# Patient Record
Sex: Female | Born: 1960 | Race: White | Hispanic: No | Marital: Married | Smoking: Former smoker
Health system: Southern US, Community
[De-identification: ages and names within clinical notes are randomized; demographics above are authoritative.]

## PROBLEM LIST (undated history)

## (undated) DIAGNOSIS — N186 End stage renal disease: Secondary | ICD-10-CM

## (undated) DIAGNOSIS — Z720 Tobacco use: Secondary | ICD-10-CM

## (undated) DIAGNOSIS — F419 Anxiety disorder, unspecified: Secondary | ICD-10-CM

## (undated) DIAGNOSIS — Z951 Presence of aortocoronary bypass graft: Secondary | ICD-10-CM

## (undated) DIAGNOSIS — Z992 Dependence on renal dialysis: Secondary | ICD-10-CM

## (undated) DIAGNOSIS — G629 Polyneuropathy, unspecified: Secondary | ICD-10-CM

## (undated) DIAGNOSIS — E785 Hyperlipidemia, unspecified: Secondary | ICD-10-CM

## (undated) DIAGNOSIS — S99919A Unspecified injury of unspecified ankle, initial encounter: Secondary | ICD-10-CM

## (undated) DIAGNOSIS — I6529 Occlusion and stenosis of unspecified carotid artery: Secondary | ICD-10-CM

## (undated) DIAGNOSIS — R9430 Abnormal result of cardiovascular function study, unspecified: Secondary | ICD-10-CM

## (undated) DIAGNOSIS — R011 Cardiac murmur, unspecified: Secondary | ICD-10-CM

## (undated) DIAGNOSIS — IMO0002 Reserved for concepts with insufficient information to code with codable children: Secondary | ICD-10-CM

## (undated) DIAGNOSIS — Z87891 Personal history of nicotine dependence: Secondary | ICD-10-CM

## (undated) DIAGNOSIS — E663 Overweight: Secondary | ICD-10-CM

## (undated) DIAGNOSIS — I2510 Atherosclerotic heart disease of native coronary artery without angina pectoris: Secondary | ICD-10-CM

## (undated) DIAGNOSIS — I499 Cardiac arrhythmia, unspecified: Secondary | ICD-10-CM

## (undated) HISTORY — DX: End stage renal disease: Z99.2

## (undated) HISTORY — DX: Unspecified injury of unspecified ankle, initial encounter: S99.919A

## (undated) HISTORY — DX: Presence of aortocoronary bypass graft: Z95.1

## (undated) HISTORY — DX: Cardiac arrhythmia, unspecified: I49.9

## (undated) HISTORY — DX: Hemochromatosis, unspecified: E83.119

## (undated) HISTORY — DX: Anxiety disorder, unspecified: F41.9

## (undated) HISTORY — PX: INNER EAR SURGERY: SHX679

## (undated) HISTORY — DX: Cardiac murmur, unspecified: R01.1

## (undated) HISTORY — PX: OTHER SURGICAL HISTORY: SHX169

## (undated) HISTORY — DX: Polyneuropathy, unspecified: G62.9

## (undated) HISTORY — DX: Personal history of nicotine dependence: Z87.891

## (undated) HISTORY — PX: AV FISTULA PLACEMENT: SHX1204

## (undated) HISTORY — DX: End stage renal disease: N18.6

## (undated) HISTORY — DX: Abnormal result of cardiovascular function study, unspecified: R94.30

## (undated) HISTORY — DX: Reserved for concepts with insufficient information to code with codable children: IMO0002

## (undated) HISTORY — DX: Hyperlipidemia, unspecified: E78.5

## (undated) HISTORY — DX: Overweight: E66.3

## (undated) HISTORY — DX: Atherosclerotic heart disease of native coronary artery without angina pectoris: I25.10

## (undated) HISTORY — DX: Tobacco use: Z72.0

## (undated) HISTORY — DX: Occlusion and stenosis of unspecified carotid artery: I65.29

---

## 1993-07-09 HISTORY — PX: CHOLECYSTECTOMY: SHX55

## 1999-02-05 ENCOUNTER — Inpatient Hospital Stay (HOSPITAL_COMMUNITY): Admission: RE | Admit: 1999-02-05 | Discharge: 1999-02-10 | Payer: Self-pay | Admitting: *Deleted

## 2005-01-12 ENCOUNTER — Ambulatory Visit (HOSPITAL_COMMUNITY): Admission: RE | Admit: 2005-01-12 | Discharge: 2005-01-12 | Payer: Self-pay | Admitting: Vascular Surgery

## 2005-06-04 ENCOUNTER — Ambulatory Visit: Payer: Self-pay | Admitting: Psychiatry

## 2005-07-09 HISTORY — PX: CARDIAC VALVE SURGERY: SHX40

## 2005-08-08 ENCOUNTER — Ambulatory Visit: Payer: Self-pay | Admitting: Psychiatry

## 2005-08-23 ENCOUNTER — Ambulatory Visit: Payer: Self-pay | Admitting: Psychiatry

## 2005-09-12 ENCOUNTER — Ambulatory Visit (HOSPITAL_COMMUNITY): Payer: Self-pay | Admitting: Psychiatry

## 2005-10-02 ENCOUNTER — Ambulatory Visit (HOSPITAL_COMMUNITY): Payer: Self-pay | Admitting: Psychiatry

## 2005-10-03 ENCOUNTER — Ambulatory Visit (HOSPITAL_COMMUNITY): Payer: Self-pay | Admitting: Psychiatry

## 2005-10-24 ENCOUNTER — Ambulatory Visit (HOSPITAL_COMMUNITY): Payer: Self-pay | Admitting: Psychiatry

## 2005-10-30 ENCOUNTER — Ambulatory Visit (HOSPITAL_COMMUNITY): Payer: Self-pay | Admitting: Psychiatry

## 2005-11-14 ENCOUNTER — Ambulatory Visit (HOSPITAL_COMMUNITY): Payer: Self-pay | Admitting: Psychiatry

## 2005-11-29 ENCOUNTER — Ambulatory Visit (HOSPITAL_COMMUNITY): Payer: Self-pay | Admitting: Psychiatry

## 2005-12-14 ENCOUNTER — Ambulatory Visit (HOSPITAL_COMMUNITY): Payer: Self-pay | Admitting: Psychiatry

## 2006-01-04 ENCOUNTER — Ambulatory Visit (HOSPITAL_COMMUNITY): Payer: Self-pay | Admitting: Psychiatry

## 2006-01-24 ENCOUNTER — Ambulatory Visit (HOSPITAL_COMMUNITY): Payer: Self-pay | Admitting: Psychiatry

## 2006-01-30 ENCOUNTER — Ambulatory Visit (HOSPITAL_COMMUNITY): Payer: Self-pay | Admitting: Psychiatry

## 2006-02-27 ENCOUNTER — Ambulatory Visit (HOSPITAL_COMMUNITY): Payer: Self-pay | Admitting: Psychiatry

## 2006-03-20 ENCOUNTER — Ambulatory Visit (HOSPITAL_COMMUNITY): Payer: Self-pay | Admitting: Psychiatry

## 2006-03-26 ENCOUNTER — Ambulatory Visit (HOSPITAL_COMMUNITY): Payer: Self-pay | Admitting: Psychiatry

## 2006-04-22 ENCOUNTER — Ambulatory Visit (HOSPITAL_COMMUNITY): Payer: Self-pay | Admitting: Psychiatry

## 2006-05-06 ENCOUNTER — Encounter: Payer: Self-pay | Admitting: Cardiology

## 2006-05-08 ENCOUNTER — Ambulatory Visit: Payer: Self-pay | Admitting: Cardiology

## 2006-05-09 HISTORY — PX: CORONARY ARTERY BYPASS GRAFT: SHX141

## 2006-05-10 ENCOUNTER — Ambulatory Visit: Payer: Self-pay | Admitting: Cardiology

## 2006-05-13 ENCOUNTER — Ambulatory Visit (HOSPITAL_COMMUNITY): Payer: Self-pay | Admitting: Psychiatry

## 2006-05-17 ENCOUNTER — Ambulatory Visit: Payer: Self-pay | Admitting: Cardiology

## 2006-05-22 ENCOUNTER — Inpatient Hospital Stay (HOSPITAL_COMMUNITY): Admission: AD | Admit: 2006-05-22 | Discharge: 2006-06-03 | Payer: Self-pay | Admitting: Cardiovascular Disease

## 2006-05-22 ENCOUNTER — Inpatient Hospital Stay (HOSPITAL_BASED_OUTPATIENT_CLINIC_OR_DEPARTMENT_OTHER): Admission: RE | Admit: 2006-05-22 | Discharge: 2006-05-22 | Payer: Self-pay | Admitting: Cardiovascular Disease

## 2006-05-22 ENCOUNTER — Ambulatory Visit: Payer: Self-pay | Admitting: Cardiovascular Disease

## 2006-06-12 ENCOUNTER — Ambulatory Visit (HOSPITAL_COMMUNITY): Payer: Self-pay | Admitting: Psychiatry

## 2006-06-19 ENCOUNTER — Ambulatory Visit: Payer: Self-pay | Admitting: Cardiovascular Disease

## 2006-07-10 ENCOUNTER — Ambulatory Visit (HOSPITAL_COMMUNITY): Payer: Self-pay | Admitting: Psychiatry

## 2006-07-23 ENCOUNTER — Ambulatory Visit (HOSPITAL_COMMUNITY): Payer: Self-pay | Admitting: Psychiatry

## 2006-08-14 ENCOUNTER — Ambulatory Visit (HOSPITAL_COMMUNITY): Payer: Self-pay | Admitting: Psychiatry

## 2006-09-11 ENCOUNTER — Ambulatory Visit (HOSPITAL_COMMUNITY): Payer: Self-pay | Admitting: Psychiatry

## 2006-10-07 ENCOUNTER — Ambulatory Visit: Payer: Self-pay | Admitting: Cardiology

## 2006-10-09 ENCOUNTER — Ambulatory Visit (HOSPITAL_COMMUNITY): Payer: Self-pay | Admitting: Psychiatry

## 2006-11-20 ENCOUNTER — Encounter: Payer: Self-pay | Admitting: Cardiology

## 2006-11-21 ENCOUNTER — Ambulatory Visit (HOSPITAL_COMMUNITY): Payer: Self-pay | Admitting: Psychiatry

## 2006-12-08 ENCOUNTER — Encounter: Payer: Self-pay | Admitting: Cardiology

## 2006-12-25 ENCOUNTER — Ambulatory Visit (HOSPITAL_COMMUNITY): Payer: Self-pay | Admitting: Psychiatry

## 2007-01-08 ENCOUNTER — Ambulatory Visit (HOSPITAL_COMMUNITY): Payer: Self-pay | Admitting: Psychiatry

## 2007-01-24 ENCOUNTER — Ambulatory Visit (HOSPITAL_COMMUNITY): Payer: Self-pay | Admitting: Psychiatry

## 2007-02-19 ENCOUNTER — Ambulatory Visit (HOSPITAL_COMMUNITY): Payer: Self-pay | Admitting: Psychiatry

## 2007-02-24 ENCOUNTER — Ambulatory Visit: Payer: Self-pay | Admitting: Cardiology

## 2007-03-20 ENCOUNTER — Ambulatory Visit (HOSPITAL_COMMUNITY): Payer: Self-pay | Admitting: Psychiatry

## 2007-03-21 ENCOUNTER — Ambulatory Visit (HOSPITAL_COMMUNITY): Payer: Self-pay | Admitting: Psychiatry

## 2007-04-11 ENCOUNTER — Ambulatory Visit (HOSPITAL_COMMUNITY): Payer: Self-pay | Admitting: Psychiatry

## 2007-05-05 ENCOUNTER — Ambulatory Visit (HOSPITAL_COMMUNITY): Payer: Self-pay | Admitting: Psychiatry

## 2007-05-28 ENCOUNTER — Ambulatory Visit (HOSPITAL_COMMUNITY): Payer: Self-pay | Admitting: Psychiatry

## 2007-06-25 ENCOUNTER — Ambulatory Visit (HOSPITAL_COMMUNITY): Payer: Self-pay | Admitting: Psychiatry

## 2007-07-01 ENCOUNTER — Ambulatory Visit (HOSPITAL_COMMUNITY): Admission: RE | Admit: 2007-07-01 | Discharge: 2007-07-01 | Payer: Self-pay | Admitting: Nephrology

## 2007-07-15 ENCOUNTER — Ambulatory Visit (HOSPITAL_COMMUNITY): Payer: Self-pay | Admitting: Psychiatry

## 2007-07-23 ENCOUNTER — Ambulatory Visit (HOSPITAL_COMMUNITY): Payer: Self-pay | Admitting: Psychiatry

## 2007-08-01 ENCOUNTER — Ambulatory Visit: Payer: Self-pay | Admitting: Cardiology

## 2007-08-11 ENCOUNTER — Encounter: Payer: Self-pay | Admitting: Cardiology

## 2007-08-15 ENCOUNTER — Ambulatory Visit (HOSPITAL_COMMUNITY): Payer: Self-pay | Admitting: Psychiatry

## 2007-09-05 ENCOUNTER — Ambulatory Visit (HOSPITAL_COMMUNITY): Payer: Self-pay | Admitting: Psychiatry

## 2007-09-19 ENCOUNTER — Ambulatory Visit (HOSPITAL_COMMUNITY): Payer: Self-pay | Admitting: Psychiatry

## 2007-10-10 ENCOUNTER — Ambulatory Visit (HOSPITAL_COMMUNITY): Payer: Self-pay | Admitting: Psychiatry

## 2007-11-05 ENCOUNTER — Ambulatory Visit (HOSPITAL_COMMUNITY): Payer: Self-pay | Admitting: Psychiatry

## 2007-11-11 ENCOUNTER — Ambulatory Visit (HOSPITAL_COMMUNITY): Payer: Self-pay | Admitting: Psychiatry

## 2007-12-12 ENCOUNTER — Ambulatory Visit (HOSPITAL_COMMUNITY): Payer: Self-pay | Admitting: Psychiatry

## 2007-12-19 ENCOUNTER — Ambulatory Visit: Payer: Self-pay | Admitting: Cardiology

## 2007-12-31 ENCOUNTER — Encounter: Payer: Self-pay | Admitting: Cardiology

## 2007-12-31 ENCOUNTER — Ambulatory Visit: Payer: Self-pay | Admitting: Cardiology

## 2008-01-06 ENCOUNTER — Ambulatory Visit (HOSPITAL_COMMUNITY): Payer: Self-pay | Admitting: Psychiatry

## 2008-02-04 ENCOUNTER — Ambulatory Visit (HOSPITAL_COMMUNITY): Payer: Self-pay | Admitting: Psychiatry

## 2008-03-19 ENCOUNTER — Ambulatory Visit (HOSPITAL_COMMUNITY): Payer: Self-pay | Admitting: Psychiatry

## 2008-04-01 ENCOUNTER — Ambulatory Visit (HOSPITAL_COMMUNITY): Payer: Self-pay | Admitting: Psychiatry

## 2008-04-16 ENCOUNTER — Ambulatory Visit (HOSPITAL_COMMUNITY): Payer: Self-pay | Admitting: Psychiatry

## 2008-05-17 ENCOUNTER — Ambulatory Visit: Payer: Self-pay | Admitting: Cardiology

## 2008-06-18 ENCOUNTER — Ambulatory Visit (HOSPITAL_COMMUNITY): Payer: Self-pay | Admitting: Psychiatry

## 2008-07-22 ENCOUNTER — Ambulatory Visit (HOSPITAL_COMMUNITY): Payer: Self-pay | Admitting: Psychiatry

## 2008-07-23 ENCOUNTER — Ambulatory Visit (HOSPITAL_COMMUNITY): Payer: Self-pay | Admitting: Psychiatry

## 2008-08-27 ENCOUNTER — Ambulatory Visit (HOSPITAL_COMMUNITY): Payer: Self-pay | Admitting: Psychiatry

## 2008-09-16 ENCOUNTER — Ambulatory Visit (HOSPITAL_COMMUNITY): Payer: Self-pay | Admitting: Psychiatry

## 2008-09-22 ENCOUNTER — Encounter: Payer: Self-pay | Admitting: Cardiology

## 2008-09-24 ENCOUNTER — Ambulatory Visit (HOSPITAL_COMMUNITY): Payer: Self-pay | Admitting: Psychiatry

## 2008-10-20 ENCOUNTER — Encounter: Payer: Self-pay | Admitting: Cardiology

## 2008-10-21 ENCOUNTER — Encounter: Payer: Self-pay | Admitting: Cardiology

## 2008-10-29 ENCOUNTER — Ambulatory Visit (HOSPITAL_COMMUNITY): Payer: Self-pay | Admitting: Psychiatry

## 2008-11-08 ENCOUNTER — Encounter: Payer: Self-pay | Admitting: Cardiology

## 2008-11-12 ENCOUNTER — Ambulatory Visit (HOSPITAL_COMMUNITY): Payer: Self-pay | Admitting: Psychiatry

## 2008-12-01 ENCOUNTER — Ambulatory Visit: Payer: Self-pay | Admitting: Cardiology

## 2008-12-10 ENCOUNTER — Encounter: Payer: Self-pay | Admitting: Cardiology

## 2008-12-14 ENCOUNTER — Ambulatory Visit (HOSPITAL_COMMUNITY): Payer: Self-pay | Admitting: Psychiatry

## 2008-12-29 ENCOUNTER — Ambulatory Visit (HOSPITAL_COMMUNITY): Payer: Self-pay | Admitting: Psychiatry

## 2009-02-09 ENCOUNTER — Ambulatory Visit (HOSPITAL_COMMUNITY): Payer: Self-pay | Admitting: Psychiatry

## 2009-02-09 ENCOUNTER — Encounter: Payer: Self-pay | Admitting: Cardiology

## 2009-03-02 ENCOUNTER — Ambulatory Visit (HOSPITAL_COMMUNITY): Payer: Self-pay | Admitting: Psychiatry

## 2009-04-13 ENCOUNTER — Ambulatory Visit (HOSPITAL_COMMUNITY): Payer: Self-pay | Admitting: Psychiatry

## 2009-04-14 ENCOUNTER — Ambulatory Visit (HOSPITAL_COMMUNITY): Payer: Self-pay | Admitting: Psychiatry

## 2009-05-04 ENCOUNTER — Ambulatory Visit (HOSPITAL_COMMUNITY): Payer: Self-pay | Admitting: Psychiatry

## 2009-06-09 ENCOUNTER — Ambulatory Visit: Payer: Self-pay | Admitting: Cardiology

## 2009-06-09 DIAGNOSIS — Z6837 Body mass index (BMI) 37.0-37.9, adult: Secondary | ICD-10-CM | POA: Insufficient documentation

## 2009-06-09 DIAGNOSIS — F411 Generalized anxiety disorder: Secondary | ICD-10-CM | POA: Insufficient documentation

## 2009-07-12 ENCOUNTER — Ambulatory Visit (HOSPITAL_COMMUNITY): Payer: Self-pay | Admitting: Psychiatry

## 2009-07-24 ENCOUNTER — Ambulatory Visit: Payer: Self-pay | Admitting: Cardiology

## 2009-07-25 ENCOUNTER — Encounter: Payer: Self-pay | Admitting: Cardiology

## 2009-09-09 ENCOUNTER — Ambulatory Visit (HOSPITAL_COMMUNITY): Payer: Self-pay | Admitting: Psychiatry

## 2009-09-26 ENCOUNTER — Ambulatory Visit: Payer: Self-pay | Admitting: Cardiology

## 2009-09-26 DIAGNOSIS — E039 Hypothyroidism, unspecified: Secondary | ICD-10-CM | POA: Insufficient documentation

## 2009-10-06 ENCOUNTER — Ambulatory Visit (HOSPITAL_COMMUNITY): Payer: Self-pay | Admitting: Psychiatry

## 2009-10-10 ENCOUNTER — Telehealth (INDEPENDENT_AMBULATORY_CARE_PROVIDER_SITE_OTHER): Payer: Self-pay | Admitting: *Deleted

## 2009-10-17 ENCOUNTER — Ambulatory Visit (HOSPITAL_COMMUNITY): Payer: Self-pay | Admitting: Psychiatry

## 2009-11-07 ENCOUNTER — Ambulatory Visit (HOSPITAL_COMMUNITY): Payer: Self-pay | Admitting: Psychiatry

## 2009-11-22 ENCOUNTER — Telehealth (INDEPENDENT_AMBULATORY_CARE_PROVIDER_SITE_OTHER): Payer: Self-pay | Admitting: *Deleted

## 2009-11-29 ENCOUNTER — Encounter (INDEPENDENT_AMBULATORY_CARE_PROVIDER_SITE_OTHER): Payer: Self-pay | Admitting: *Deleted

## 2009-11-29 ENCOUNTER — Encounter: Payer: Self-pay | Admitting: Cardiology

## 2009-11-30 ENCOUNTER — Ambulatory Visit: Payer: Self-pay | Admitting: Cardiology

## 2009-11-30 ENCOUNTER — Ambulatory Visit (HOSPITAL_COMMUNITY): Payer: Self-pay | Admitting: Psychiatry

## 2009-12-09 ENCOUNTER — Encounter: Payer: Self-pay | Admitting: Cardiology

## 2009-12-10 ENCOUNTER — Encounter: Payer: Self-pay | Admitting: Cardiology

## 2009-12-13 ENCOUNTER — Telehealth: Payer: Self-pay | Admitting: Cardiology

## 2009-12-13 ENCOUNTER — Encounter: Payer: Self-pay | Admitting: Cardiology

## 2010-01-11 ENCOUNTER — Encounter: Payer: Self-pay | Admitting: Cardiology

## 2010-01-11 ENCOUNTER — Ambulatory Visit: Payer: Self-pay | Admitting: Vascular Surgery

## 2010-01-18 ENCOUNTER — Ambulatory Visit (HOSPITAL_COMMUNITY): Payer: Self-pay | Admitting: Psychiatry

## 2010-01-26 ENCOUNTER — Ambulatory Visit (HOSPITAL_COMMUNITY): Payer: Self-pay | Admitting: Psychiatry

## 2010-02-01 ENCOUNTER — Encounter: Payer: Self-pay | Admitting: Cardiology

## 2010-02-17 ENCOUNTER — Ambulatory Visit (HOSPITAL_COMMUNITY): Payer: Self-pay | Admitting: Psychiatry

## 2010-02-20 ENCOUNTER — Telehealth (INDEPENDENT_AMBULATORY_CARE_PROVIDER_SITE_OTHER): Payer: Self-pay | Admitting: *Deleted

## 2010-03-10 ENCOUNTER — Ambulatory Visit (HOSPITAL_COMMUNITY): Payer: Self-pay | Admitting: Psychiatry

## 2010-03-17 ENCOUNTER — Ambulatory Visit: Payer: Self-pay | Admitting: Cardiology

## 2010-03-31 ENCOUNTER — Ambulatory Visit (HOSPITAL_COMMUNITY): Payer: Self-pay | Admitting: Psychiatry

## 2010-04-06 ENCOUNTER — Telehealth (INDEPENDENT_AMBULATORY_CARE_PROVIDER_SITE_OTHER): Payer: Self-pay | Admitting: *Deleted

## 2010-04-12 ENCOUNTER — Ambulatory Visit (HOSPITAL_COMMUNITY): Payer: Self-pay | Admitting: Psychiatry

## 2010-04-25 ENCOUNTER — Ambulatory Visit (HOSPITAL_COMMUNITY): Payer: Self-pay | Admitting: Psychiatry

## 2010-05-03 ENCOUNTER — Ambulatory Visit (HOSPITAL_COMMUNITY): Payer: Self-pay | Admitting: Psychiatry

## 2010-05-24 ENCOUNTER — Ambulatory Visit (HOSPITAL_COMMUNITY): Payer: Self-pay | Admitting: Psychiatry

## 2010-06-14 ENCOUNTER — Ambulatory Visit (HOSPITAL_COMMUNITY): Payer: Self-pay | Admitting: Psychiatry

## 2010-07-07 ENCOUNTER — Ambulatory Visit (HOSPITAL_COMMUNITY): Payer: Self-pay | Admitting: Psychiatry

## 2010-07-20 ENCOUNTER — Ambulatory Visit (HOSPITAL_COMMUNITY): Admit: 2010-07-20 | Payer: Self-pay | Admitting: Psychiatry

## 2010-07-21 ENCOUNTER — Ambulatory Visit
Admission: RE | Admit: 2010-07-21 | Discharge: 2010-07-21 | Payer: Self-pay | Source: Home / Self Care | Attending: Vascular Surgery | Admitting: Vascular Surgery

## 2010-07-25 ENCOUNTER — Ambulatory Visit (HOSPITAL_COMMUNITY)
Admission: RE | Admit: 2010-07-25 | Discharge: 2010-07-25 | Payer: Self-pay | Source: Home / Self Care | Attending: Psychiatry | Admitting: Psychiatry

## 2010-08-02 ENCOUNTER — Ambulatory Visit (HOSPITAL_COMMUNITY)
Admission: RE | Admit: 2010-08-02 | Discharge: 2010-08-02 | Payer: Self-pay | Source: Home / Self Care | Attending: Psychiatry | Admitting: Psychiatry

## 2010-08-04 NOTE — Procedures (Unsigned)
CAROTID DUPLEX EXAM  INDICATION:  Carotid artery disease.  HISTORY: Diabetes:  No. Cardiac:  Open heart surgery. Hypertension:  Yes. Smoking:  Yes. Previous Surgery:  Left arm AV fistula. CV History:  Asymptomatic. Amaurosis Fugax No, Paresthesias No, Hemiparesis No                                      RIGHT             LEFT Brachial systolic pressure:         150               Not obtained Brachial Doppler waveforms:         Normal            Normal Vertebral direction of flow:        Antegrade Antegrade/resistive DUPLEX VELOCITIES (cm/sec) CCA peak systolic                   M=101, D=186      M=76, [id number] ECA peak systolic                   192               [id number] ICA peak systolic                   205               [id number] ICA end diastolic                   59                23 PLAQUE MORPHOLOGY:                  Mixed             Mixed PLAQUE AMOUNT:                      Moderate          Moderate/severe PLAQUE LOCATION:                    ICA, ECA, CCA     ICA, ECA, CCA  IMPRESSION: 1. Doppler velocity suggests 60% to 70% stenosis of the right proximal     internal carotid artery. 2. No hemodynamically significant stenosis of the left proximal     internal carotid artery noted, however, there is a significant     stenosis noted at the left distal common carotid artery near the     bifurcation region. 3. Left external carotid artery stenosis noted.  ___________________________________________ Jessy Oto Fields, MD  EM/MEDQ  D:  07/21/2010  T:  07/21/2010  Job:  [id number]

## 2010-08-08 NOTE — Progress Notes (Signed)
Summary: NEED MORE NITROSTAT  Phone Note Call from Patient Call back at Home Phone [phone]   Caller: Patient Call For: nurse Summary of Call: patient called and stated she needed refill on nitrostat since she dropped hers at church yesterday. patient informed nurse that she was using them more frequent now after nurse informed her that she just got refill on March 18th with 2 refills. Patient said she had already discussed this with MD to use them for her chest discomfort she was having as often as she needed to.  Initial call taken by: Georgina Peer,  October 10, 2009 2:00 PM    Prescriptions: NITROGLYCERIN 0.4 MG SUBL (NITROGLYCERIN) dissolve one tablet under tongue for severe chest pain as needed every 5 minutes, not to exceed 3 in 15 min time frame  #25 x 2   Entered by:   Georgina Peer   Authorized by:   Lorenza Evangelist, MD, [facility]   Signed by:   Georgina Peer on 10/10/2009   Method used:   Electronically to        [facility]* (retail)       [address]       [place], [place]  [address]       Ph: [phone] or [phone]       Fax: [phone]   RxID:   [id number]

## 2010-08-08 NOTE — Assessment & Plan Note (Signed)
Summary: PER DR. Kaziyah Parkison DISCUSS FREQUENCY OF NTG USAGE-JM   Visit Type:  Follow-up Primary Provider:  Stoney Bang  CC:  chest pain.  History of Present Illness: The patient has known coronary artery disease.  She also has several types of aches and pains.  In addition she becomes anxious when she has chest or back discomfort.  In this setting she uses nitroglycerin.  She feels that it helps.  I am not convinced that this represents ischemia.  I considered putting her on a longer acting nitrate which he has problems with hypotension on her dialysis days.  Preventive Screening-Counseling & Management  Alcohol-Tobacco     Smoking Status: current     Smoking Cessation Counseling: yes     Packs/Day: 1/2 PPD  Current Medications (verified): 1)  Nitroglycerin 0.4 Mg Subl (Nitroglycerin) .... Dissolve One Tablet Under Tongue For Severe Chest Pain As Needed Every 5 Minutes, Not To Exceed 3 in 15 Min Time Frame 2)  Metoprolol Tartrate 50 Mg Tabs (Metoprolol Tartrate) .... Take 1 Tablet By Mouth Twice A Day 3)  Simvastatin 40 Mg Tabs (Simvastatin) .... Take 1 Tablet By Mouth Every Night 4)  Sertraline Hcl 50 Mg Tabs (Sertraline Hcl) .... Take 1 Tablet By Mouth Every Morning 5)  Dialyvite  Tabs (B Complex-C-Folic Acid) .... 3,000mg  Tablet Daily 6)  Levothyroxine Sodium 200 Mcg Solr (Levothyroxine Sodium) .... Once Daily 7)  Zolpidem Tartrate 5 Mg Tabs (Zolpidem Tartrate) .... Take 1 Tab By Mouth At Bedtime 8)  Tramadol Hcl 50 Mg Tabs (Tramadol Hcl) .... Take 1 Tablet By Mouth Two Times A Day, May Take Three Times A Day As Needed 9)  Renvela 800 Mg Tabs (Sevelamer Carbonate) .... Take 5 Tablets With Meals, 2 Tabs With Snacks 10)  Aspirin 325 Mg Tabs (Aspirin) .... Take 1 Tablet By Mouth Once A Day 11)  Sodium Bicarbonate 650 Mg Tabs (Sodium Bicarbonate) .... Take 1 Tablet By Mouth Two Times A Day 12)  Clonazepam 0.5 Mg Tabs (Clonazepam) .Marland Kitchen.. 1-2 As Needed 13)  Buspirone Hcl 30 Mg Tabs (Buspirone  Hcl) .... Two Times A Day 14)  Proair Hfa 108 (90 Base) Mcg/act Aers (Albuterol Sulfate) .... As Needed  Allergies (verified): No Known Drug Allergies  Comments:  Nurse/Medical Assistant: The patient is currently on medications but does not know the name or dosage at this time. Instructed to contact our office with details. Will update medication list at that time.  Past History:  Past Medical History: Last updated: 06/09/2009 Overweight Anxiety disorder CAROTID ARTERY STENOSIS (ICD-433.10)...doppler 12/10/2008.Marland Kitchen0000000 RICA, 99991111 LICA, high resistence left vertebral suggests distal stenosis....MRI could be considered CAD, NATIVE VESSEL (ICD-414.01.   Total RCA.Marland Kitchen  /  .stress echo [facility] 11/2008...no ischemia...EF 55% CABG... Off pump LIMA to LAD... 05/2006 EF  55%  echo.Marland Kitchen.11/2008 Ankle brachial indices reduced in past OVERWEIGHT/OBESITY (ICD-278.02) HYPERLIPIDEMIA TYPE I / IV (ICD-272.3) ESRD....dialysis...transplant consideration ???  ASD  ???  no shunt by echo shunt study Tobacco abuse    Social History: Packs/Day:  1/2 PPD  Review of Systems       Patient denies fever, chills, headache, sweats, rash, change in vision, change in hearing, cough, nausea vomiting, urinary symptoms.  All other systems are reviewed and are negative other than the history of present illness.  Vital Signs:  Patient profile:   50 year old female Height:      58 inches Weight:      192 pounds Pulse rate:   86 / minute BP sitting:  111 / 63  (left arm) Cuff size:   large  Vitals Entered By: Georgina Peer (Nov 30, 2009 1:08 PM) CC: chest pain   Physical Exam  General:  patient is stable but overweight. Eyes:  no xanthelasma. Neck:  no jugular venous distention. Lungs:  lungs are clear.  Respiratory effort is nonlabored. Heart:  cardiac exam reveals S1-S2.  No clicks or significant murmurs. Abdomen:  abdomen is soft. Msk:  patient has a shunt in her left arm. Extremities:  no peripheral  edema. Psych:  patient is oriented to person time and place.  Affect is normal.   Impression & Recommendations:  Problem # 1:  TOBACCO ABUSE (ICD-305.1) I have counseled the patient and urged her to try to stop smoking.  Problem # 2:  ESRD (ICD-585.6) Patient is on dialysis to be continued.  Problem # 3:  CAROTID ARTERY STENOSIS (ICD-433.10)  Her updated medication list for this problem includes:    Aspirin 325 Mg Tabs (Aspirin) .Marland Kitchen... Take 1 tablet by mouth once a day The patient has carotid disease.  Her last Doppler was in June, 2010.  We will check to be sure that there is a one year followup plans.  Problem # 4:  CAD, NATIVE VESSEL (ICD-414.01) The patient has coronary disease.  She had a stress echo in May of 2010 showing no ischemia.  I do not think that her current chest pain he is ischemic in origin.  No further workup at this time.  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  I am not convinced it is cardiac in origin.  I considered adding a regular nitrate but I'm concerned about the possibility of hypotension at dialysis.  She knows to be careful with the nitroglycerin.  After very careful consideration I've chosen to allow her to take it on a regular basis as needed.  Her prescriptions will be filled on a regular basis.  Patient Instructions: 1)  Your physician wants you to follow-up in: 6 months. You will receive a reminder letter in the mail one-two months in advance. If you don't receive a letter, please call our office to schedule the follow-up appointment. 2)  You may have 30 nitroglycerin tablets per month per Dr. Ron Parker. Prescriptions: NITROSTAT 0.4 MG SUBL (NITROGLYCERIN) 1 tablet under tongue at onset of chest pain; you may repeat every 5 minutes for up to 3 doses.  #30 x 6   Entered by:   Gurney Maxin, RN, BSN   Authorized by:   Lorenza Evangelist, MD, [facility]   Signed by:   Gurney Maxin,  RN, BSN on 11/30/2009   Method used:   Electronically to        [facility]* (retail)       [address]       [place], [place]  [address]       Ph: [phone] or [phone]       Fax: [phone]   RxID:   [id number]

## 2010-08-08 NOTE — Progress Notes (Signed)
Summary: Needs ABD narrowing evaluated  Phone Note Call from Patient Call back at Home Phone [phone]   Summary of Call: Pt states she was called to [facility] for a kidney transplant this weekend. She states the doctor at [facility] transplant office told her that she has a valve in her "stomach" that was narrowing. He told pt it needs to be opened (balloon or stent) or she could potentially lose her legs if has kidney transplant. She states she had an ultrasound of her abd but it's been several years ago. Abd ultrasound from April 2009 scanned into EMR. Pt states she's not sure what the doctor was talking about but assumes we would be the one's to handle this.  Initial call taken by: Gurney Maxin, RN, BSN,  December 13, 2009 11:33 AM  Follow-up for Phone Call        Please find out who the doctor at [facility] is. We need written notes regarding their concerns. I have no idea what she needs to have done with her abdomen. Also, fax a copy of my notes from today along with a copy of the doppler to the [facility] doctor.      Appended Document: Needs ABD narrowing evaluated Left message to call back on machine regarding contact info for [facility]. Notified pt in message she can bring contact info to the office when she comes to sign release of information.  Appended Document: Needs ABD narrowing evaluated Left message with York Spaniel, RN, BSN at [facility] regarding information needed.  Appended Document: Needs ABD narrowing evaluated Records received from [facility] and routed to Dr. Ron Parker for review.  Appended Document: Needs ABD narrowing evaluated Please make appointment to see me. Also, can we try to get the office note from her recent traqnsplant evaluation at [facility]?   Appended Document: Needs ABD narrowing evaluated Pt scheduled to see Dr. Ron Parker on Friday, September 9th at 2pm. This is his first available appt.   Note from [facility] transplant team on 6/4 scanned into pts chart.

## 2010-08-08 NOTE — Miscellaneous (Signed)
Summary: Orders Update  Clinical Lists Changes  Orders: Added new Referral order of Carotid Duplex (Carotid Duplex) - Signed

## 2010-08-08 NOTE — Progress Notes (Signed)
Summary: NTG Refill: Pharmacy Question  Phone Note From Pharmacy   Caller: Kim at [facility] Summary of Call: Sherry Cox, nurse who also works at [employer], states pt has been getting frequent refills of NTG. She is concerned that pt is needing to fill NTG this frequently. She states pt was given 7 bottles of 25 tablets in April and has had NTG filled on 5/5 and 5/11 this month. She is now requesting another refill. Per Sherry Cox, pt told her today that she was told by Dr. Ron Parker to take NTG for anxiety. Discussed with Georgina Peer, LPN who states Dr. Ron Parker has told her in the past to refill this med as needed. Sherry Cox states if pt is going to continue using NTG at this frequency they need different directions for usage. Initial call taken by: Gurney Maxin, RN, BSN,  Nov 22, 2009 1:47 PM  Follow-up for Phone Call        Schedule patient to come in to see me in next few weeks.  Additional Follow-up for Phone Call Additional follow up Details #1::        Pt notified and scheduled for appt on Wednesday, May 25th with Dr. Ron Parker.   Kim at [facility]. Also notified Sherry Cox pt can have 1 refill on her NTG pending this appt.  Additional Follow-up by: Gurney Maxin, RN, BSN,  Nov 23, 2009 3:15 PM     Appended Document: NTG Refill: Pharmacy Question clarity in phone note,correction, patient informed Alma Friendly that she discussed with MD during her last visit about using the nitroglycerin and was informed by MD to use this as needed for her chest pain per previous phone note.

## 2010-08-08 NOTE — Progress Notes (Signed)
Summary: [facility]: Dr. Oneida Alar  [facility]: Dr. Oneida Alar   Imported By: Gurney Maxin, RN, BSN 02/03/2010 09:47:52  _____________________________________________________________________  External Attachment:    Type:   Image     Comment:   External Document

## 2010-08-08 NOTE — Letter (Signed)
Summary: External Correspondence/ FAXED [facility]  External Correspondence/ FAXED [facility]   Imported By: Bartholomew Boards 03/17/2010 14:46:29  _____________________________________________________________________  External Attachment:    Type:   Image     Comment:   External Document

## 2010-08-08 NOTE — Consult Note (Signed)
Summary: CARDIOOLOGY CONSULT/ [facility]/ [place]   Imported By: Delfino Lovett 09/26/2009 12:03:16  _____________________________________________________________________  External Attachment:    Type:   Image     Comment:   External Document

## 2010-08-08 NOTE — Letter (Signed)
Summary: [facility]/ D/C DR. HASANAJ  [facility]/ D/C DR. HASANAJ   Imported By: Delfino Lovett 09/26/2009 12:05:00  _____________________________________________________________________  External Attachment:    Type:   Image     Comment:   External Document

## 2010-08-08 NOTE — Assessment & Plan Note (Signed)
Summary: SCHEDULE APPT PER DR. KATZ-JM   Visit Type:  Follow-up Referring Provider:  Ruta Hinds, MD    Ilean China, RN, BSN  Pre-transplant Coordinator, Fax [phone] Primary Provider:  Javier Glazier   History of Present Illness: The patient is seen today for cardiology followup.  She is actually doing well.  She had some slight chest discomfort at times and becomes anxious.  She then takes one quarter of a nitroglycerin tablet.  We are not convinced that she is actually having ischemia.  The patient is going to have surgery to have her kidneys removed.  She has seen Dr. Ruta Hinds of [facility] in [place] for vascular evaluation of her carotids.  I will be faxing a copy of today's note and a copy of Dr. Nona Dell note to the patient's transplant coordinator.  The patient underwent CABG with an off pump LIMA to the LAD in November, 2007.  Echocardiography revealed an ejection fraction of 55% in May, 2010.  She actually had a stress echo at [facility] at that time with no ischemia.  She has significant carotid artery disease.  This has been assessed by Dr.Fields.  See his report.  There is question also of an abdominal bruit.  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   Preventive Screening-Counseling & Management  Alcohol-Tobacco     Smoking Status: current     Smoking Cessation Counseling: yes     Packs/Day: 1/2 PPD  Current Medications (verified): 1)  Nitrostat 0.4 Mg Subl (Nitroglycerin) .Marland Kitchen.. 1 Tablet Under Tongue At Onset of Chest Pain; You May Repeat Every 5 Minutes For Up To 3 Doses. 2)  Metoprolol Tartrate 50 Mg Tabs (Metoprolol Tartrate) .... Take 1 Tablet By Mouth Twice A Day 3)  Simvastatin 40 Mg Tabs (Simvastatin) .... Take 1 Tablet By Mouth Every Night 4)  Sertraline Hcl 50 Mg Tabs (Sertraline Hcl) .... Take 1 Tablet By Mouth Every Morning 5)  Dialyvite  Tabs (B Complex-C-Folic Acid) .... 3,000mg  Tablet Daily 6)   Levothyroxine Sodium 200 Mcg Solr (Levothyroxine Sodium) .... Once Daily 7)  Zolpidem Tartrate 5 Mg Tabs (Zolpidem Tartrate) .... Take 1 Tab By Mouth At Bedtime 8)  Tramadol Hcl 50 Mg Tabs (Tramadol Hcl) .... Take 1 Tablet By Mouth Two Times A Day, May Take Three Times A Day As Needed 9)  Renvela 800 Mg Tabs (Sevelamer Carbonate) .... Take 5 Tablets With Meals, 3 Tabs With Snacks 10)  Aspirin 325 Mg Tabs (Aspirin) .... Take 1 Tablet By Mouth Once A Day 11)  Sodium Bicarbonate 650 Mg Tabs (Sodium Bicarbonate) .... Take 1 Tablet By Mouth Two Times A Day 12)  Clonazepam 0.5 Mg Tabs (Clonazepam) .... Take 1 Tablet By Mouth Once A Day 13)  Proair Hfa 108 (90 Base) Mcg/act Aers (Albuterol Sulfate) .... As Needed  Allergies (verified): No Known Drug Allergies  Comments:  Nurse/Medical Assistant: The patient's medication list and allergies were reviewed with the patient and were updated in the Medication and Allergy Lists.  Past History:  Past Medical History: Overweight Anxiety disorder CAROTID ARTERY STENOSIS (ICD-433.10)...doppler 12/10/2008.Marland Kitchen0000000 RICA, [id number] LICA  /    see  evaluation by Dr. Oneida Alar  2011 CAD, NATIVE VESSEL (ICD-414.01.   Total RCA.Marland Kitchen  /  .stress echo [facility] 11/2008...no ischemia...EF 55% CABG... Off pump LIMA to LAD... 05/2006 EF  55%  echo.Marland Kitchen.11/2008 Ankle brachial indices reduced in past OVERWEIGHT/OBESITY (ICD-278.02) HYPERLIPIDEMIA TYPE I / IV (ICD-272.3) ESRD....dialysis...transplant consideration ???  ASD  ???  no shunt by echo shunt study Tobacco abuse    Review of Systems       Patient denies fever, chills, headache, sweats, rash, change in vision, change in hearing, chest pain, cough, nausea vomiting, urinary symptoms.  All other systems are reviewed and are negative.  Vital Signs:  Patient profile:   50 year old female Height:      58 inches Weight:      187 pounds Pulse rate:   72 / minute BP sitting:   122 / 70  (left arm) Cuff size:   large  Vitals  Entered By: Georgina Peer (March 17, 2010 1:48 PM)  Physical Exam  General:  The patient is overweight but stable. Head:  head is atraumatic. Eyes:  no xanthelasma. Neck:  no jugular venous distention. Chest Wall:  no chest wall tenderness. Lungs:  lungs are clear.  Respiratory effort is nonlabored. Heart:  cardiac exam reveals S1-S2.  No clicks or significant murmurs Abdomen:  abdomen is soft. Msk:  no musculoskeletal deformities. Extremities:  no peripheral edema  Skin:  no skin rashes. Psych:  patient is oriented to person time and place.  Affect is normal.   Impression & Recommendations:  Problem # 1:  * FREQUENT NITROGLYCERIN USE As I have mentioned in the history of present illness I believe she uses a small amount of nitroglycerin when she becomes anxious.  I do not believe she has significant ongoing ischemic symptoms.  Problem # 2:  TOBACCO ABUSE (ICD-305.1) Unfortunately the patient continues to smoke a small amount.  I have counseled her to stop.  Problem # 3:  CAROTID ARTERY STENOSIS (ICD-433.10)  Her updated medication list for this problem includes:    Aspirin 325 Mg Tabs (Aspirin) .Marland Kitchen... Take 1 tablet by mouth once a day Carotid artery stenosis is significant but stable.  Problem # 4:  CAD, NATIVE VESSEL (ICD-414.01)  Her updated medication list for this problem includes:    Nitrostat 0.4 Mg Subl (Nitroglycerin) .Marland Kitchen... 1 tablet under tongue at onset of chest pain; you may repeat every 5 minutes for up to 3 doses.    Metoprolol Tartrate 50 Mg Tabs (Metoprolol tartrate) .Marland Kitchen... Take 1 tablet by mouth twice a day    Aspirin 325 Mg Tabs (Aspirin) .Marland Kitchen... Take 1 tablet by mouth once a day Coronary disease is stable.  No further workup is needed.  Problem # 5:  ESRD (ICD-585.6) The patient is to have one of her kidneys removed.  A copy of this note will be sent to her pre-transplant coordinator along with a copy of Dr.Fields' note.  Patient Instructions: 1)  Your  physician wants you to follow-up in: 6 months. You will receive a reminder letter in the mail one-two months in advance. If you don't receive a letter, please call our office to schedule the follow-up appointment. 2)  Your physician recommends that you continue on your current medications as directed. Please refer to the Current Medication list given to you today.

## 2010-08-08 NOTE — Assessment & Plan Note (Signed)
Summary: [facility]   Visit Type:  Chart update Primary Provider:  Stoney Bang   History of Present Illness: PATIENT IS NOT SEEN IN OFFICE TODAY. I reviewed carotid doppler report. I am concerned about the findings. We will refer patient for early vascular surgery evaluation. They may want to repeat the doppler before deciding the next study needed.  See my office note of 11/30/2009 for complete history of patient. Send copy of this to vascular surgery.  Allergies: No Known Drug Allergies

## 2010-08-08 NOTE — Letter (Signed)
Summary: [facility]   [facility]   Imported By: Sallee Provencal 02/06/2010 16:13:42  _____________________________________________________________________  External Attachment:    Type:   Image     Comment:   External Document

## 2010-08-08 NOTE — Consult Note (Signed)
Summary: CONSULT / DR. Lowanda Foster  CONSULT / DR. Lowanda Foster   Imported By: Delfino Lovett 09/26/2009 12:05:39  _____________________________________________________________________  External Attachment:    Type:   Image     Comment:   External Document

## 2010-08-08 NOTE — Letter (Signed)
Summary: External Correspondence/ [facility]  External Correspondence/ [facility]   Imported By: Bartholomew Boards 12/19/2009 10:24:24  _____________________________________________________________________  External Attachment:    Type:   Image     Comment:   External Document

## 2010-08-08 NOTE — Letter (Signed)
Summary: Generic Doctor, general practice at [facility]. [address], [place] [address]   Phone: [phone]  Fax: [phone]    11/29/2009  Kellsey Whitcher [id number] [place], [place]  [address]  Dear Ms. Delong,   I have enclosed a copy of the order requested by Dr. Ron Parker for you cartoid dopplers. If this date and time are not suitable for you please call our office.         Sincerely,   Delfino Lovett Patient Care coordinator [phone] ext. [place]

## 2010-08-08 NOTE — Progress Notes (Signed)
Summary: REQUEST FOR ADDITIONAL NITROSTAT  Phone Note Call from Patient Call back at Home Phone [phone]   Caller: Patient Call For: doctor Summary of Call: patient called requesting additional refill of nitrostat since she is having some increased stressors from her little sister having a MI and she lost her daughter also. uses [facility]. Please advise. Initial call taken by: Georgina Peer,  February 20, 2010 4:25 PM  Follow-up for Phone Call        OK to fill Lorenza Evangelist, MD, [facility]  February 21, 2010 9:28 AM  Additional Follow-up for Phone Call Additional follow up Details #1::        Patient and pharmacy informed of the above.  Additional Follow-up by: Georgina Peer,  February 21, 2010 9:41 AM    Prescriptions: NITROSTAT 0.4 MG SUBL (NITROGLYCERIN) 1 tablet under tongue at onset of chest pain; you may repeat every 5 minutes for up to 3 doses.  #30 x 6   Entered by:   Georgina Peer   Authorized by:   Lorenza Evangelist, MD, [facility]   Signed by:   Georgina Peer on 02/21/2010   Method used:   Electronically to        [facility]* (retail)       [address]       [place], [place]  [address]       Ph: [phone] or [phone]       Fax: [phone]   RxID:   [id number]

## 2010-08-08 NOTE — Assessment & Plan Note (Signed)
Summary: EPH 2 MONTH FU PER DR.KATZ   Visit Type:  Follow-up Primary Provider:  Hasanaj  CC:  CAD.  History of Present Illness: The patient is seen for followup of coronary artery disease.  He has some vague chest heaviness at times but is not having any chest pain.  She had an off-pump LIMA to the LAD in November 2007.  Patient feels jittery today, but this may be related to the dosing of the inhaler.  Also her thyroid dose had been increased.  She will see Dr.Hasanaj in the next few days in talking to him about repeating her TSH.  Current Medications (verified): 1)  Nitroglycerin 0.4 Mg Subl (Nitroglycerin) .... Dissolve One Tablet Under Tongue For Severe Chest Pain As Needed Every 5 Minutes, Not To Exceed 3 in 15 Min Time Frame 2)  Metoprolol Tartrate 50 Mg Tabs (Metoprolol Tartrate) .... Take 1 Tablet By Mouth Twice A Day 3)  Simvastatin 40 Mg Tabs (Simvastatin) .... Take 1 Tablet By Mouth Every Night 4)  Sertraline Hcl 50 Mg Tabs (Sertraline Hcl) .... Take 1 Tablet By Mouth Every Morning 5)  Dialyvite  Tabs (B Complex-C-Folic Acid) .... 3,000mg  Tablet Daily 6)  Levothyroxine Sodium 200 Mcg Solr (Levothyroxine Sodium) .... Once Daily 7)  Zolpidem Tartrate 5 Mg Tabs (Zolpidem Tartrate) .... Take 1 Tab By Mouth At Bedtime 8)  Tramadol Hcl 50 Mg Tabs (Tramadol Hcl) .... Take 1 Tablet By Mouth Two Times A Day, May Take Three Times A Day As Needed 9)  Renvela 800 Mg Tabs (Sevelamer Carbonate) .... Take 5 Tablets With Meals, 2 Tabs With Snacks 10)  Aspirin 325 Mg Tabs (Aspirin) .... Take 1 Tablet By Mouth Once A Day 11)  Sodium Bicarbonate 650 Mg Tabs (Sodium Bicarbonate) .... Take 1 Tablet By Mouth Two Times A Day 12)  Clonazepam 0.5 Mg Tabs (Clonazepam) .Marland Kitchen.. 1-2 As Needed 13)  Buspirone Hcl 30 Mg Tabs (Buspirone Hcl) .... Two Times A Day 14)  Proair Hfa 108 (90 Base) Mcg/act Aers (Albuterol Sulfate) .... As Needed  Allergies (verified): No Known Drug Allergies  Past History:  Past  Medical History: Last updated: 06/09/2009 Overweight Anxiety disorder CAROTID ARTERY STENOSIS (ICD-433.10)...doppler 12/10/2008.Marland Kitchen0000000 RICA, [id number] LICA, high resistence left vertebral suggests distal stenosis....MRI could be considered CAD, NATIVE VESSEL (ICD-414.01.   Total RCA.Marland Kitchen  /  .stress echo [facility] 11/2008...no ischemia...EF 55% CABG... Off pump LIMA to LAD... 05/2006 EF  55%  echo.Marland Kitchen.11/2008 Ankle brachial indices reduced in past OVERWEIGHT/OBESITY (ICD-278.02) HYPERLIPIDEMIA TYPE I / IV (ICD-272.3) ESRD....dialysis...transplant consideration ???  ASD  ???  no shunt by echo shunt study Tobacco abuse    Review of Systems       Patient denies fever, chills headache, rash, change in vision, change in hearing, cough, nausea vomiting, urinary symptoms.  All other systems are reviewed and are negative.  Vital Signs:  Patient profile:   50 year old female Height:      58 inches Weight:      195 pounds BMI:     40.90 Pulse rate:   110 / minute BP sitting:   126 / 62  (right arm) Cuff size:   regular  Vitals Entered By: Mignon Pine, RMA (September 26, 2009 1:40 PM)  Physical Exam  General:  patient is stable today. Eyes:  no xanthelasma. Neck:  right carotid bruit. Lungs:  lungs are clear respiratory effort is nonlabored. Heart:  cardiac exam reveals S1-S2.  There is a soft systolic murmur. Abdomen:  abdomen is  obese. Extremities:  no peripheral edema. Psych:  patient is oriented to person time and place.  Affect is normal.   Impression & Recommendations:  Problem # 1:  TOBACCO ABUSE (ICD-305.1) I have spoken with the patient about her smoking and encouraged her to stop.  Problem # 2:  ESRD (ICD-585.6) patient is stable on dialysis.  She is on a transplant list.  Problem # 3:  OVERWEIGHT (ICD-278.02) Weight loss would be very helpful in her case.  Problem # 4:  CAROTID ARTERY STENOSIS (ICD-433.10)  Her updated medication list for this problem includes:    Aspirin 325  Mg Tabs (Aspirin) .Marland Kitchen... Take 1 tablet by mouth once a day Patient has significant carotid disease.  In June of 2010 the carotid disease was moderate.  There was question of higher grade disease in the vertebrals.  She will have a followup study in June of this year.  Problem # 5:  CAD, NATIVE VESSEL (ICD-414.01)  Her updated medication list for this problem includes:    Nitroglycerin 0.4 Mg Subl (Nitroglycerin) .Marland Kitchen... Dissolve one tablet under tongue for severe chest pain as needed every 5 minutes, not to exceed 3 in 15 min time frame    Metoprolol Tartrate 50 Mg Tabs (Metoprolol tartrate) .Marland Kitchen... Take 1 tablet by mouth twice a day    Aspirin 325 Mg Tabs (Aspirin) .Marland Kitchen... Take 1 tablet by mouth once a day The patient has known coronary disease.  She is post CABG in May of 2010.  No further workup now.  Problem # 6:  HYPOTHYROIDISM (ICD-244.9)  Her updated medication list for this problem includes:    Levothyroxine Sodium 200 Mcg Solr (Levothyroxine sodium) ..... Once daily The patient's thyroid dosing has been on a higher level recently.  He will talk to her primary doctor about having her TSH checked soon.  Patient Instructions: 1)  Your physician wants you to follow-up in: 6 months. You will receive a reminder letter in the mail one-two months in advance. If you don't receive a letter, please call our office to schedule the follow-up appointment. 2)  Your physician has requested that you have a carotid duplex. This test is an ultrasound of the carotid arteries in your neck. It looks at blood flow through these arteries that supply the brain with blood. Allow one hour for this exam. There are no restrictions or special instructions. YOU ARE DUE FOR THIS TEST IN JUNE AND WILL RECEIVE A LETTER AROUND THIS TIME TO SCHEDULE THIS TEST.  3)  Your physician recommends that you continue on your current medications as directed. Please refer to the Current Medication list given to you today.

## 2010-08-08 NOTE — Progress Notes (Signed)
  Phone Note From Other Clinic   Caller: Stephanie/[facility] Initial call taken by: km    Stress,LOV,Cath,12,Echo faxed to [place]  April 06, 2010 8:41 AM

## 2010-08-23 ENCOUNTER — Encounter (INDEPENDENT_AMBULATORY_CARE_PROVIDER_SITE_OTHER): Payer: Medicare Other | Admitting: Psychiatry

## 2010-08-23 DIAGNOSIS — F329 Major depressive disorder, single episode, unspecified: Secondary | ICD-10-CM

## 2010-09-13 ENCOUNTER — Encounter (INDEPENDENT_AMBULATORY_CARE_PROVIDER_SITE_OTHER): Payer: Medicare Other | Admitting: Psychiatry

## 2010-09-13 DIAGNOSIS — F329 Major depressive disorder, single episode, unspecified: Secondary | ICD-10-CM

## 2010-09-13 DIAGNOSIS — F411 Generalized anxiety disorder: Secondary | ICD-10-CM

## 2010-09-22 ENCOUNTER — Encounter: Payer: Self-pay | Admitting: *Deleted

## 2010-10-01 ENCOUNTER — Encounter: Payer: Self-pay | Admitting: Cardiology

## 2010-10-01 DIAGNOSIS — N186 End stage renal disease: Secondary | ICD-10-CM | POA: Insufficient documentation

## 2010-10-01 DIAGNOSIS — Z992 Dependence on renal dialysis: Secondary | ICD-10-CM

## 2010-10-01 DIAGNOSIS — E785 Hyperlipidemia, unspecified: Secondary | ICD-10-CM | POA: Insufficient documentation

## 2010-10-01 DIAGNOSIS — Z951 Presence of aortocoronary bypass graft: Secondary | ICD-10-CM | POA: Insufficient documentation

## 2010-10-01 DIAGNOSIS — F17200 Nicotine dependence, unspecified, uncomplicated: Secondary | ICD-10-CM | POA: Insufficient documentation

## 2010-10-01 DIAGNOSIS — I6529 Occlusion and stenosis of unspecified carotid artery: Secondary | ICD-10-CM | POA: Insufficient documentation

## 2010-10-02 ENCOUNTER — Ambulatory Visit (INDEPENDENT_AMBULATORY_CARE_PROVIDER_SITE_OTHER): Payer: Medicare Other | Admitting: Cardiology

## 2010-10-02 ENCOUNTER — Encounter: Payer: Self-pay | Admitting: Cardiology

## 2010-10-02 VITALS — BP 125/61 | HR 64 | Ht <= 58 in | Wt 192.1 lb

## 2010-10-02 DIAGNOSIS — F17200 Nicotine dependence, unspecified, uncomplicated: Secondary | ICD-10-CM

## 2010-10-02 DIAGNOSIS — I2510 Atherosclerotic heart disease of native coronary artery without angina pectoris: Secondary | ICD-10-CM

## 2010-10-02 DIAGNOSIS — N186 End stage renal disease: Secondary | ICD-10-CM

## 2010-10-02 DIAGNOSIS — I6529 Occlusion and stenosis of unspecified carotid artery: Secondary | ICD-10-CM

## 2010-10-02 DIAGNOSIS — Z720 Tobacco use: Secondary | ICD-10-CM

## 2010-10-02 DIAGNOSIS — Z992 Dependence on renal dialysis: Secondary | ICD-10-CM

## 2010-10-02 NOTE — Progress Notes (Signed)
HPI Patient is seen for cardiology followup.  She has coronary artery disease.  She underwent off-pump   CABG with LIMA to the LAD in 2007.  She has good left ventricular function.  She is going to have her kidneys removed soon.  This is being done at [facility].     No Known Allergies  Current Outpatient Prescriptions  Medication Sig Dispense Refill  . albuterol (PROAIR HFA) 108 (90 BASE) MCG/ACT inhaler Inhale 2 puffs into the lungs as needed.        Marland Kitchen aspirin 325 MG tablet Take 325 mg by mouth daily.        . clonazePAM (KLONOPIN) 0.5 MG tablet Take 1 tablet by mouth daily.      . folic acid-vitamin b complex-vitamin c-selenium-zinc (DIALYVITE) 3 MG TABS Take 1 tablet by mouth daily.        Marland Kitchen HYDROcodone-acetaminophen (VICODIN) 5-500 MG per tablet Take one tablet twice a day       . levothyroxine (SYNTHROID, LEVOTHROID) 175 MCG tablet Take 175 mcg by mouth daily.        . metoprolol (LOPRESSOR) 50 MG tablet Take 1 tablet by mouth two times a day.       . nitroGLYCERIN (NITROSTAT) 0.4 MG SL tablet Place one tablet under tongue every 5 minutes up to 3 doses as needed for chest pain. No more than 3 doses over a 15 minute period.       . sertraline (ZOLOFT) 50 MG tablet Take 1 tablet by mouth every morning.        . sevelamer (RENVELA) 800 MG tablet Take 5 tablets by mouth with meals and 3 tablets by mouth with snacks.       . simvastatin (ZOCOR) 40 MG tablet Take 1 tablet by mouth every night.       . sodium bicarbonate 650 MG tablet Take 1 tablet by mouth two times a day.       . zolpidem (AMBIEN) 5 MG tablet Take 5 mg by mouth at bedtime as needed.        Marland Kitchen DISCONTD: levothyroxine (SYNTHROID, LEVOTHROID) 200 MCG tablet Take 1 tablet by mouth daily.       Marland Kitchen DISCONTD: traMADol (ULTRAM) 50 MG tablet Take 1 tablet by mouth 2-3 times per day as needed pain.         History   Social History  . Marital Status: Married    Spouse Name: N/A    Number of Children: N/A  .  Years of Education: N/A   Occupational History  . DISABLED    Social History Main Topics  . Smoking status: Current Some Day Smoker -- 0.5 packs/day    Types: Cigarettes  . Smokeless tobacco: Not on file   Comment: 4 ciggs per day  . Alcohol Use: No  . Drug Use: No  . Sexually Active: Not on file   Other Topics Concern  . Not on file   Social History Narrative  . No narrative on file    No family history on file.  Past Medical History  Diagnosis Date  . Overweight   . Anxiety disorder   . Carotid artery stenosis     12/10/2008 doppler 0000000 RICA, [id number] LICA/see evaluation by Dr. Oneida Alar 2011  . CAD in native artery     Total RCA, stress echo [facility] 11/2008-no ischemia, EF 55%; h/o CABG  . Hyperlipidemia   . ESRD on dialysis     Transplant consideration  .  Tobacco abuse   . Hx of CABG     Off pump LIMA to LAD  05/2006    Past Surgical History  Procedure Date  . Cholecystectomy   . Coronary artery bypass graft 05/2006    Off pump LIMA to LAD    ROS  Patient denies Fever, chills.  She had some mild headache with some mild sinus symptoms.  She denies rash, change in vision, change in hearing, chest pain, cough, nausea vomiting, urinary symptoms.  All other systems are reviewed and are negative. PHYSICAL EXAM Patient is overweight.  She is oriented to person time and place.  Affect is normal.  Head is atraumatic.  There is no xanthelasma.  There is no jugular venous distention.  Lungs reveal a few scattered rhonchi.  Cardiac exam reveals an S1-S2 and a soft systolic murmur.  The abdomen is soft.  There is no significant peripheral edema.  There are no musculoskeletal deformities.  No skin rash. Filed Vitals:   10/02/10 1003  BP: 125/61  Pulse: 64  Height: 4\' 10"  (1.473 m)  Weight: 192 lb 1.9 oz (87.145 kg)    EKG  EKG done today and reviewed by me.  It is normal.  ASSESSMENT & PLAN

## 2010-10-02 NOTE — Assessment & Plan Note (Signed)
Patient states that she smoking 4 cigarettes per day.  I have counseled her to try to stop completely.

## 2010-10-02 NOTE — Assessment & Plan Note (Signed)
Carotid arteries are followed by Dr. Oneida Alar.  No further workup.

## 2010-10-02 NOTE — Assessment & Plan Note (Signed)
Coronary disease is stable.  EKG today shows no change.  She is stable to have nephrectomies done.

## 2010-10-02 NOTE — Assessment & Plan Note (Signed)
She is stable on dialysis.  No change in therapy.

## 2010-10-04 ENCOUNTER — Encounter (INDEPENDENT_AMBULATORY_CARE_PROVIDER_SITE_OTHER): Payer: Medicare Other | Admitting: Psychiatry

## 2010-10-04 DIAGNOSIS — F329 Major depressive disorder, single episode, unspecified: Secondary | ICD-10-CM

## 2010-10-04 DIAGNOSIS — F411 Generalized anxiety disorder: Secondary | ICD-10-CM

## 2010-10-19 ENCOUNTER — Encounter: Payer: Self-pay | Admitting: Cardiology

## 2010-10-24 ENCOUNTER — Telehealth: Payer: Self-pay | Admitting: Cardiology

## 2010-10-24 ENCOUNTER — Encounter (INDEPENDENT_AMBULATORY_CARE_PROVIDER_SITE_OTHER): Payer: Medicare Other | Admitting: Psychiatry

## 2010-10-24 DIAGNOSIS — F329 Major depressive disorder, single episode, unspecified: Secondary | ICD-10-CM

## 2010-10-24 NOTE — Telephone Encounter (Signed)
Pt needs refill of simvastatin, nitro, and metoprolol

## 2010-10-25 ENCOUNTER — Encounter (INDEPENDENT_AMBULATORY_CARE_PROVIDER_SITE_OTHER): Payer: Medicare Other | Admitting: Psychiatry

## 2010-10-25 DIAGNOSIS — F329 Major depressive disorder, single episode, unspecified: Secondary | ICD-10-CM

## 2010-10-25 MED ORDER — NITROGLYCERIN 0.4 MG SL SUBL: SUBLINGUAL_TABLET | SUBLINGUAL | Status: DC

## 2010-10-25 MED ORDER — METOPROLOL TARTRATE 50 MG PO TABS: 50.0000 mg | ORAL_TABLET | Freq: Two times a day (BID) | ORAL | Status: DC

## 2010-10-25 MED ORDER — SIMVASTATIN 40 MG PO TABS: 40.0000 mg | ORAL_TABLET | Freq: Every day | ORAL | Status: DC

## 2010-10-25 NOTE — Telephone Encounter (Signed)
[place] script pharmacy

## 2010-11-20 ENCOUNTER — Encounter (INDEPENDENT_AMBULATORY_CARE_PROVIDER_SITE_OTHER): Payer: Medicare Other | Admitting: Psychiatry

## 2010-11-20 DIAGNOSIS — F329 Major depressive disorder, single episode, unspecified: Secondary | ICD-10-CM

## 2010-11-20 DIAGNOSIS — F411 Generalized anxiety disorder: Secondary | ICD-10-CM

## 2010-11-21 NOTE — Assessment & Plan Note (Signed)
[place] OFFICE NOTE   CARESSE, HOLLOBAUGH                     MRN:          [id number]  DATE:12/01/2008                            DOB:          Oct 20, 1960    Ms. Melucci is doing well.  Her overall cardiac status is stable.  She  has significant carotid disease.  She was scheduled to have a carotid  Doppler in February but this did not happen.  We will arrange for this  now.  She had a dobutamine stress echo study done on Nov 17, 2008 at  [facility].  The study showed that there was no sign of  ischemia.  Her ejection fraction was in the 55% range.  She had aortic  valve sclerosis but no stenosis.  Overall there was no sign of ischemia.  She is not having any chest pain.  I talked with her about her smoking.  She continues to cut down.   PAST MEDICAL HISTORY:  1. Allergies:  NO KNOWN DRUG ALLERGIES.  2. Medications:  Aspirin, thyroid, Zoloft, Dialyvite, sodium bicarb,      metoprolol, Renvela, and simvastatin.  3. Other medical problems:  See the complete list below.   REVIEW OF SYSTEMS:  She is not having any fevers or chills.  There are  no skin rashes.  She has a slight sensation on the area above her left  ear.  I believe that this is insignificant.  I do not believe that is  related to carotid flow.  She is not having any chest pain or shortness  of breath.  There is no cough.  There is no change in her vision or her  hearing.  There are no GU complaints.  She states that she had a  procedure on her bile duct.  I do not have information about this.  All  other systems are reviewed and are negative.   PHYSICAL EXAMINATION:  SKIN:  Reveals no new skin rashes.  GENERAL:  She is quite stable.  The patient is oriented to person, time,  and place.  Affect is normal.  HEENT:  Reveals no xanthelasma.  She has normal extraocular motion.  Blood pressure is 160/73 with a pulse of 62.  Weight  is 176.  Respirations are 18.  There is no jugular venous distention.  The patient has loud bilateral  carotid bruits.  LUNGS:  Clear.  Respiratory effort is not labored.  CARDIAC:  Exam reveals S1 and S2.  There are no clicks or significant  murmurs.  ABDOMEN:  Obese but soft.  She has no significant peripheral edema.   No labs were done today.  I have reviewed the echo report from [facility]  dated Nov 17, 2008 and the stress echo report from the same date.   PROBLEMS:  1. Good LV function by echo in the past and most recently.  2. History of 60-70% right internal carotid artery stenosis and 40-59%      left internal carotid artery stenosis.  She will have follow-up      Dopplers arranged very soon and  copies will be sent to the      transplant coordinator at [facility].  3. Mild decrease in ankle-brachial indices in the past but this is      stable.  4. Question of a secundum atrial septal defect.  However, her shunt      study recent echo showed no obvious shunting.  5. Hyperlipidemia, treated.  6. End-stage renal disease, on dialysis.  7. Smoking.  I talked with her about this and she continues to cut      down.  8. Anxiety disorder, stable.  9. Obesity.  10.Coronary disease, status post off-pump internal mammary artery to      the LAD.  This was done in November 2007.  Her occluded right      coronary artery could not be approached.  11.Good LV function.  12.Clearance for possible renal transplant.  She is actively on the      transplant list.  We will have the copy of her Dopplers of her      carotids sent to the transplant coordinator.  This will be done      fairly soon.     Carlena Bjornstad, MD, [facility]  Electronically Signed    JDK/MedQ  DD: 12/01/2008  DT: 12/01/2008  Job #: [id number]   cc:   Abdominal Organ Transplant Department [place] Spaniel,  RN, BSN  Alison Murray, M.D.  [place]

## 2010-11-21 NOTE — Assessment & Plan Note (Signed)
OFFICE VISIT   Sherry Cox, Sherry Cox  DOB:  05/14/1961                                       01/11/2010  [id number]   ADDENDUM:  I reviewed, interpreted and ordered her carotid duplex ultrasound today.     Jessy Oto. Fields, MD  Electronically Signed   CEF/MEDQ  D:  01/11/2010  T:  01/12/2010  Job:  [id number]

## 2010-11-21 NOTE — Assessment & Plan Note (Signed)
[place] OFFICE NOTE   TRISHELLE, FRANCISCO                     MRN:          [id number]  DATE:05/17/2008                            DOB:          February 22, 1961    Ms. Sherry Cox is actually stable.  I am seeing her for the followup of  her coronary artery disease, also for followup of murmur and the  question in the past of an atrial septal defect.  The patient also  mentions today that she is having pain in her feet, and we discussed  this further.  When I saw her in June 2009, I cleared her to proceed  with the workup for a kidney transplant.  She is in the process now at  [facility], and she will be having further evaluation soon.   She has not been having any significant chest pain or shortness of  breath.  Before sexual activity she at times has decided to use a  nitroglycerin and this seems to help her.  We discussed this and this is  safe for her.   ALLERGIES:  No known drug allergies.   MEDICATIONS:  Aspirin, Renagel, levothyroxine, Zoloft, Crestor,  Dialyvite, sodium bicarb, and metoprolol.  She takes the metoprolol 50  b.i.d. on regular days and 50 only in the evening on the days that she  has dialysis.   OTHER MEDICAL PROBLEMS:  See the extensive list on my note of December 19, 2007.   REVIEW OF SYSTEMS:  She is not having any GI or GU symptoms.  She is not  having any fevers or chills or skin rashes.  She has no headaches.  Otherwise, her review of systems is negative.  She does mention that she  has had some pain in her back.  Also, she has mentioned that she has  pain in her heels.  The heel pain occurs when standing or if sitting for  a long period of time.  It is not related to exertion.  It does not  sound like vascular ischemia.  Otherwise, review of systems is negative.   PHYSICAL EXAMINATION:  Blood pressure is 140/69 with a pulse of 72.  Weight is 178 pounds.  The patient  is oriented to person, time, and  place.  Affect is normal.  HEENT reveals no xanthelasma.  She has normal  extraocular motion.  There are carotid bruits.  There is no jugular  venous distention.  Lungs are clear.  Respiratory effort is not labored.  Cardiac exam reveals an S1 with an S2.  There is a 2/6 soft systolic  murmur.  Her abdomen is soft.  She has no peripheral edema.   The patient did have a 2-D echo on December 31, 2007, which is since her  last visit and I have reviewed the report.  She has normal systolic  function.  There was no obvious definite atrial septal defect seen.  She  had no major valvular abnormalities.   Problems are listed on the note of December 19, 2007,  #2.  Significant carotid stenoses and it will be  time for her followup  carotid Doppler in February 2010.  She does have continued bruits.  #4.  Question of a small secundum atrial septal defect in the past.  With her last echo, there is no obvious shunting seen.  #5.  Hyperlipidemia.  Her insurance company is requesting that she be  switched from Crestor to another medication and we will help her work  through this.  #10.  Coronary artery disease.  She is post coronary artery bypass graft  on November 2007, and she is stable.  #11.  Systolic murmur.  She does not have any significant valvular  disease at this time.  New problem:  Pain in her feet.  I believe that this is not vascular in  origin.   The patient will have carotid Dopplers in February.  We will look into  changing her Crestor to another medicine that has been approved for her.  We will also renew her nitroglycerin.  I will see her back in 6 months.     Carlena Bjornstad, MD, [facility]  Electronically Signed    JDK/MedQ  DD: 05/17/2008  DT: 05/18/2008  Job #: [id number]   cc:   Berton Mount, M.D.

## 2010-11-21 NOTE — Assessment & Plan Note (Signed)
[place] OFFICE NOTE   Sherry, Cox                     MRN:          [id number]  DATE:12/19/2007                            DOB:          09/21/1960    Sherry Cox is seen for cardiology followup.  She has known cardiac  disease, but she really is quite stable.  It is time for her to begin  assessment for possible kidney transplant.  From the cardiac viewpoint,  I am completely supportive of this, and this should begin.   The patient is not having any chest pain.  She is not having any major  shortness of breath.  She is trying to cut down her smoking.  She does  want to start the patch now.  She is dialyzed regularly from her left  arm.  She had undergone CABG in November 2007.  She does have carotid  disease.  She also probably has a small atrial septal defect.   PAST MEDICAL HISTORY:   ALLERGIES:  No known drug allergies.   MEDICATIONS:  1. Levothyroxine 175 mcg.  2. Dialyvite.  3. Renagel.  4. Zoloft.  5. Aspirin.  6. Crestor.  7. Metoprolol.  8. Sodium bicarb.  9. Zolpidem.   OTHER MEDICAL PROBLEMS:  See the list below.   REVIEW OF SYSTEMS:  Other than the HPI, her review of systems is  negative.   PHYSICAL EXAMINATION:  VITAL SIGNS:  Blood pressure is 138/71 with a  pulse of 77.  Her weight is 176 pounds.   PHYSICAL EXAMINATION:  GENERAL:  The patient is oriented to person,  time, and place.  Affect is normal.  HEENT:  Reveals no xanthelasma.  She has normal extraocular motion.  NECK:  She does have carotid bruits.  There is no jugular venous  distention.  CARDIAC:  Reveals an S1 with an S2.  There is a systolic murmur.  ABDOMEN:  Soft.  Her shunt in her left arm is stable.  She has no  significant peripheral edema.   PROBLEMS:  1. Good left ventricular function.  2. A 60-70% right internal carotid stenosis and 40-59% left internal      carotid stenosis.  3.  Ankle-brachial indexes of 0.8 bilaterally.  4. Secundum atrial septal defect with left-to-right shunt that is very      small.  This could potentially need closure in the future, but I      would not plan to do this at any time in the near future.  She has      no indication that it is hemodynamically significant.  5. Hyperlipidemia, treated.  6. End-stage renal disease, on dialysis.  7. Smoking.  We will give her a patch.  8. Anxiety disorder, quite stable.  9. Obesity.  She is trying to lose weight.  10.Coronary disease, status post off-pump left internal mammary artery      to the left anterior descending.  She has an occluded right that      could not be approached.  She has good left ventricular function.  11.Systolic murmur.  We have  not thought that she had any substantial      valvular disease.  She does need to follow up 2-D echo.   The patient is cleared to have an evaluation for renal transplant.  I  feel this would be appropriate in her case.  I will see her for  Cardiology followup.     Carlena Bjornstad, MD, [facility]  Electronically Signed    JDK/MedQ  DD: 12/19/2007  DT: 12/20/2007  Job #: [id number]   cc:   Berton Mount, M.D.

## 2010-11-21 NOTE — Procedures (Signed)
CAROTID DUPLEX EXAM   INDICATION:  Carotid disease.   HISTORY:  Diabetes:  No.  Cardiac:  Open heart surgery.  Hypertension:  Yes.  Smoking:  Yes.  Previous Surgery:  Left arm AV fistula.  CV History:  Currently asymptomatic.  Amaurosis Fugax No, Paresthesias No, Hemiparesis No                                       RIGHT             LEFT  Brachial systolic pressure:  Brachial Doppler waveforms:         Normal            Normal  Vertebral direction of flow:        Antegrade  Antegrade/resistive  DUPLEX VELOCITIES (cm/sec)  CCA peak systolic                   87                [id number]  ECA peak systolic                   180               [id number]  ICA peak systolic                   231               [id number]  ICA end diastolic                   60                21  PLAQUE MORPHOLOGY:                  Mixed             Mixed  PLAQUE AMOUNT:                      Moderate          Moderate/severe  PLAQUE LOCATION:                    ICA / ECA / CCA   ICA / ECA / CCA   IMPRESSION:  1. Doppler velocity suggests a 60%-79% stenosis of the right proximal      internal carotid artery.  2. No hemodynamically significant stenosis of the left proximal      internal carotid artery noted, however, there is a significant      stenosis noted at the left distal common carotid artery near the      bifurcation region.  3. Left external carotid artery stenosis noted.   ___________________________________________  Jessy Oto Fields, MD   CH/MEDQ  D:  01/12/2010  T:  01/12/2010  Job:  [id number]

## 2010-11-21 NOTE — Assessment & Plan Note (Signed)
OFFICE VISIT   Sherry Cox, Sherry Cox  DOB:  August 29, 1960                                       01/11/2010  [id number]   The patient is a 50 year old female referred by Dr. Ron Parker for carotid  stenosis.  The patient states that she has had a known carotid stenosis  for approximately 4 years.  This has been followed with surveillance  duplex ultrasound.  She denies any prior history of TIA, amaurosis or  stroke.   Chronic medical problems include end-stage renal disease, coronary  artery disease, hypertension, elevated cholesterol.  She is currently on  dialysis 3 days a week.  She is being considered for a kidney transplant  at [facility].  Her chronic medical problems are currently  controlled and followed by Dr. Ron Parker and Dorothea Ogle.  She denies any  prior history of diabetes.  Greater than 3 minutes today were spent  regarding smoking cessation.  The patient has tried multiple times in  the past to quit smoking with a variety of items such as Chantix  patches, gum.  I also discussed with her establishing a quit date.  She  will try to quit in the near future.   PAST MEDICAL HISTORY:  As mentioned above.   PAST SURGICAL HISTORY:  She had coronary artery bypass grafting in 2007.   FAMILY HISTORY:  Remarkable for her father who had vascular disease at  age less than 56.   SOCIAL HISTORY:  She is married.  She is a smoker, half pack a day.  She  does not consume alcohol regularly.   REVIEW OF SYSTEMS:  Full 12 point review of systems was performed with  the patient today.  Please see intake referral form for details  regarding this.   MEDICATIONS:  1. Include clonazepam 0.5 mg half tablet once a day.  2. Dialyvite 3000 one tablet once daily.  3. Metoprolol 50 mg 1 tablet twice daily.  4. Levothyroxine 200 mcg once daily.  5. Zolpidem tablet q.h.s.  6. Sodium bicarbonate 650 mg twice a day.  7. Simvastatin 40 mg q.h.s.  8. Sertraline  50 mg q.h.s.  9. Tramadol 50 mg three times a day.  10.Nitrostat p.r.n.  11.Aspirin 325 mg once a day.  12.Renvela 800 mg 5 tablets with each meal and 2 tablets with each      snack.   ALLERGIES:  She has no known drug allergies.   PHYSICAL EXAM:  Vital signs:  Blood pressure is 156/78 in the right arm,  heart rate is 89 and regular, respirations 20.  HEENT:  Unremarkable.  Neck:  Has 2+ carotid pulses with bilateral carotid bruits.  Chest:  Is  clear to auscultation.  Cardiac:  A 3/6 systolic murmur heard throughout  the precordium.  Abdomen:  Obese, soft, nontender, nondistended with a  midline epigastric bruit.  There are no masses.  Extremities:  She has  2+ radial, femoral and dorsalis pedis pulses bilaterally.  She has no  significant lower extremity edema.  Musculoskeletal:  Exam shows no  obvious major joint deformities.  Neurologic:  She has symmetric upper  extremity and lower extremity motor strength which is 5/5 and symmetric.  She has no obvious sensory deficits.  Skin:  Has no open ulcers or  rashes.   We repeated her carotid duplex exam today which  shows a 60%-80% stenosis  of the right internal carotid artery trending more towards the 60%.  She  has a 60%-80% stenosis of the left carotid basically at the carotid bulb  which is more toward the middle range of the 60%-80% zone.   Currently the patient has bilateral moderate carotid stenoses.  These  are both currently asymptomatic.  I believe the best option for  management of these currently would be continued risk factor  modification with smoking cessation, management of the cholesterol and  hypertension.  She will also continue her antiplatelet therapy with  aspirin.  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  However, she denies any symptoms of food fear,  weight loss or postprandial pain.  Most  likely this is an asymptomatic  lesion and probably does not require treatment at this point.  She will  follow up as needed at the request of [facility] if further evaluation  of her mesenteric circulation is necessary.  Otherwise she will follow  up with me in six months' time for repeat carotid duplex exam or sooner  if she develops symptoms of neurologic dysfunction.     Jessy Oto. Fields, MD  Electronically Signed   CEF/MEDQ  D:  01/11/2010  T:  01/12/2010  Job:  [id number]   cc:   Carlena Bjornstad, MD, [facility]

## 2010-11-21 NOTE — Assessment & Plan Note (Signed)
[place] OFFICE NOTE   Sherry Cox, Sherry Cox                     MRN:          [id number]  DATE:08/01/2007                            DOB:          05/17/1961    Ms. Mcelhinney is stable.  She has significant vascular disease.  She is  also on dialysis.  I saw her last in August 2008.  She was actually  stable.  She had been willing to try Chantix and then a family member  told her that she, the family member, had had significant difficulties.  Therefore, she did not try it.  She continues to smoke a small amount.  We talked about this and she knows she needs to stop.  She has proven  coronary disease post CABG.  She also has significant carotid disease.  She is not having any significant chest pain.   PAST MEDICAL HISTORY:   ALLERGIES:  No known drug allergies.   MEDICATIONS:  Thyroid Dialyvite, Renagel, Allegra, Zoloft, aspirin,  Crestor and metoprolol.   OTHER MEDICAL PROBLEMS:  See the list on my note of February 24, 2007.   REVIEW OF SYSTEMS:  Actually, she is doing well.  Review of systems  otherwise is negative.  There had been question the patient had  decreased flow in her left arm dialysis shunt.  This was evaluated in  [place].  She tells me that she does have some decreased flow that  was related to older problems, the shunt is working and there is no  particular problem.   PHYSICAL EXAM:  Blood pressure is 141/71.  with a rate of 68.  Weight is  174 pounds.  The patient is oriented to person, time and place.  Affect is normal.  She is overweight.  HEENT:  Reveals no xanthelasma.  She has normal extraocular motion.  There are loud bilateral carotid bruits.  There is no jugular venous  distention.  Lungs are clear.  Respiratory effort is not labored.  Cardiac exam reveals a 2/6 systolic murmur.  The abdomen is obese but soft.  She has no significant peripheral edema.   EKG today  reveals a normal sinus rhythm.   PROBLEMS:  Listed on my note of February 24, 2007.  #2.  History of 60-70% right internal carotid artery stenosis and 40-59%  left internal carotid artery stenosis.  Dopplers were greater than 1  year ago.  She needs follow-up carotid Dopplers.  #7.  Smoking.  As discussed above.  We have encouraged as strongly as we  can that she stop completely.  #10.  Coronary disease.  She is stable in this regard.  The patient  needs aggressive the  treatment of her lipids.  She needs to stop smoking.  Her overall status  is stable.  I will see her back in 6 months.     Carlena Bjornstad, MD, [facility]  Electronically Signed    JDK/MedQ  DD: 08/01/2007  DT: 08/01/2007  Job #: [id number]   cc:   Sherril Cong, MD

## 2010-11-21 NOTE — Assessment & Plan Note (Signed)
[place] OFFICE NOTE   LYNDALL, FAVA                     MRN:          [id number]  DATE:02/24/2007                            DOB:          1960/09/13    Ms. Schiro is seen for cardiology followup.   She has complex vascular disease.  She is actually stable.  She is post  CABG in November 2007.  She has not had any recurring chest pain.  Unfortunately, she has begun to smoke a small amount and we will try to  help her with medication for this at her request.   She is post off pump LIMA to the LAD.  She has a small ASD that is not  hemodynamically significant and we are following this.   PAST MEDICAL HISTORY:   ALLERGIES:  No known drug allergies.   MEDICATIONS:  1. Levothyroxine.  2. Dalyvite.  3. Renagel.  4. Allegra.  5. Clonazepam.  6. Zoloft 50.  7. Aspirin 325.  8. Metoprolol 50 b.i.d.  9. Crestor 10.   OTHER MEDICAL PROBLEMS:  See the list below.   REVIEW OF SYSTEMS:  Other than starting to smoke a small amount, her  review of systems is negative.   PHYSICAL EXAMINATION:  VITAL SIGNS:  Blood pressure today is 165/87.  This varies and on dialysis days it is significantly lower.  Her pulse  is 79.  Her weight is 177 pounds.  NEUROLOGIC:  The patient is oriented to person, time, and place.  Affect  is normal.  HEENT:  Reveals no xanthelasma. She has normal extraocular motion.  NECK:  The patient has bilateral carotid bruits.  There is no jugular  venous distention.  LUNGS:  Clear.  Respiratory effort is not labored.  CARDIAC:  Reveals an S1 with an S2.  There are no clicks or significant  murmurs.  ABDOMEN:  Obese but soft.  She has no masses or bruits.  EXTREMITIES:  There is no significant peripheral edema.   PROBLEM LIST:  1. Good left ventricular function.  2. A 60-70% right internal carotid artery stenosis and 40-59% left      internal carotid artery stenosis.  3.  Ankle brachial indices of 0.8 bilaterally.  4. Secundum atrial septal defect with a left to right shunt that is      very small.  She may need a closure in the future but we will      follow this.  5. Hyperlipidemia being treated.  6. End stage renal disease on dialysis.  7. Smoking which she had stopped and now restarted and we will use      Chantix.  It can be used with dialysis patients at 0.5 mg daily      with no up titration.  8. Anxiety disorder.  9. Obesity, loosing weight clearly would be to her advantage.  10.Coronary disease.  She had off pump LIMA to the LAD.  She has an      occluded right that could not be approached.   The patient is stable.  She needs aggressive secondary prevention.  We  will push hard to get her to stop smoking.     Carlena Bjornstad, MD, [facility]  Electronically Signed    JDK/MedQ  DD: 02/24/2007  DT: 02/24/2007  Job #: [id number]   cc:   Sherril Cong, MD

## 2010-11-24 NOTE — Assessment & Plan Note (Signed)
[place] OFFICE NOTE   EMMELINA, LILJENQUIST                     MRN:          [id number]  DATE:05/10/2006                            DOB:          01/16/1961    REFERRING PHYSICIAN:  Sherie Don, MD   Ms. Schmied is a very pleasant 50 year old female, with no prior cardiac  history. She is now referred for evaluation of chest pain. She was recently  hospitalized here at [facility] on October 30, for overnight observation and  ruled out for myocardial infarction with negative cardiac markers. Of note,  her initial troponin was 0.04, with a followup of 0.03.   The patients cardiac risk factors are notable for long-standing tobacco  smoking, history of hyperlipidemia, and a strong family history of coronary  artery disease. She has not had a prior cardiac workup.   The patient does have endstage renal disease and has been on hemodialysis  for the past 18 months or so. She also reports having had hypertension in  the past, but that this subsequently, spontaneously corrected.   The patient also has a long-standing history of anxiety disorder and it is  in this context that she reports having had chest pain when she presented to  the emergency room recently. However, despite the fact that she has had  chest pain for the past two years, she seems to feel that it has gotten  worse over the past two weeks. Of note, it not is strictly correlated with  exertion.   Most recently, while experiencing a recurrent panic attack while riding as  passenger in a car, she developed chest pain a bit worse than in the past.  She rated this an 8/10 and there was associated diaphoresis, dyspnea, nausea  and vomiting. There was no associated radiation, however. She describes this  as a pressure sensation which started in the left upper chest and then  across the precordium. She was tended to by EMS and was treated with  sublingual nitroglycerin spray, with reported proper relief. She was then  taken to the emergency room.   The patient has had some occasional chest discomfort since then, but nothing  as severe. These have also have occurred at rest and are very brief in  duration.   The patient was referred for a post-hospital adenosine Cardiolite, which was  suggestive of possible scar/ischemia in the inferior wall, but with normal  wall motion, no clinical chest pain, and no diagnostic EKG changes; ejection  fraction 64%.   Electrocardiogram today reveals normal sinus rhythm at 77 beats-per-minute,  with normal axis and non-specific ST abnormalities.   ALLERGIES:  No known allergies.   CURRENT MEDICATIONS:  1. Levothyroxine 0.175 mg daily.  2. Dialyvite 3000 mg daily.  3. Renagel 800 mg, 3 tablets with each snack/5 tablets with each meal.  4. Allegra 180 mg daily.  5. Klonopin 0.5 mg, 1 to 1-1/2 tablets daily.  6. Zoloft 15 mg daily.   PAST MEDICAL HISTORY:  1. Endstage renal disease.      a.     On hemodialysis.  2. Hyperthyroidism.  3. Anxiety disorder.  4. History of nephrolithiasis.  5. Long standing tobacco use.   PAST SURGICAL HISTORY:  Status post cholecystectomy, bilateral arm surgery,  and eardrum surgery.   SOCIAL HISTORY:  The patient is married. She is on total disability,  secondary to her comorbidities. They have no children. She has been smoking  since at least the age of 22. She denies alcohol or illicit drug use.   FAMILY HISTORY:  Her father died at the age of 64, secondary to myocardial  infarction with a history of prior myocardial infarctions. Her mother died  at age 7, secondary to myocardial infarction (reportedly her first). Sister  aged 75, history of myocardial infarction. Sister aged 110, status post  bypass surgery earlier this year. Brother aged 78, documented coronary  artery disease, treated medically.   REVIEW OF SYSTEMS:  Occasionally can climb a  flight of stairs with some  difficulty and occasional shortness of breath, as well as chest pain. She  has significant reflux symptoms. She has dyspnea, particularly when  experiencing panic attacks, but with no associated PND or orthopnea. She has  no significant lower extremity edema. She denies any history of pre-  syncope/syncope. Remaining systems are negative.   PHYSICAL EXAMINATION:  VITAL SIGNS: Blood pressure 116/50, pulse 77,  regular, weight 175.  GENERAL: This is a 50 year old female, moderately obese, in no apparent  distress.  HEENT: Normocephalic atraumatic.  NECK: Palpable bilateral carotid pulses with bilateral bruits (left greater  than right), as well as supraclavicular bruits.  LUNGS: Clear to auscultation.  HEART: Regular rate and rhythm (S1 and S2), grade 3/6 crescendo-decrescendo  murmur, heard loudest at the base with a preserved S2; no diastolic blow.  ABDOMEN: Protuberant, nontender with diffuse abdominal bruits.  EXTREMITIES: Palpable right femoral pulse with associated bruits; non-  palpable left femoral pulse; minimally palpable peripheral pulses with no  pedal edema nor no focal deficit.   IMPRESSION:  1. Recurrent angina pectoris.      a.     Typical/atypical features with recent low-risk adenosine stress       Cardiolite, suggestive of possible inferior ischemia.      b.     Preserved left ventricular function.      c.     Recent hospitalization with negative serial cardiac markers.  2. Probable cerebrovascular/peripheral vascular disease.      a.     Bilateral carotid bruits.      b.     Diffuse abdominal/right femoral bruits and diminished peripheral       pulses.  3. Endstage renal disease.      a.     On hemodialysis.  4. Long standing tobacco use.  5. History of hyperlipidemia.  6. Obesity.  7. Anxiety disorder.  8. Gastroesophageal reflux disease.  PLAN:  Following a review with Dr. Dola Argyle, initial recommendation is  to proceed with  further workup and evaluation of possible significant  valvular heart disease, as well as significant cerebral vascular disease. We  will therefore schedule carotid Dopplers and a 2d echocardiogram to be done  early next. I will schedule lower extremity arterial Dopplers to exclude  peripheral vascular. The patient will have fasting lipid/hepatic profile at  that time for assessment of her lipid status. She is also to start on low-  dose aspirin, and I have also provided her with a prescription for  nitroglycerin with instructions as to its use.   We will arrange for  the patient to return to the clinic for early followup  for review of these test results. Of note, we are considering pursuing  further evaluation with a diagnostic cardiac catheterization, but will defer  this pending review of her echocardiogram results. I conveyed this plan with  the patient and she is agreeable to proceed.      Gene Serpe, PA-C  Electronically Signed     ______________________________  Carlena Bjornstad, MD, [facility]   GS/MedQ  DD: 05/10/2006  DT: 05/11/2006  Job #: [id number]   cc:   Sherie Don, MD

## 2010-11-24 NOTE — Consult Note (Signed)
[name], Sherry Cox            ACCOUNT NO.:  [id number]   MEDICAL RECORD NO.:  [id number]          PATIENT TYPE:  OIB   LOCATION:  1961                         FACILITY:  [place]   PHYSICIAN:  Adella Hare, M.D.      DATE OF BIRTH:  [birth year]   DATE OF CONSULTATION:  DATE OF DISCHARGE:  05/22/2006                                 CONSULTATION   REFERRING PHYSICIAN:  Dr. Sherren Mocha.   REASON FOR REFERRAL:  Hemodialysis in the hospital.   HISTORY OF PRESENT ILLNESS:  Sherry Cox is a very pleasant 50-year-  old white female with a past medical history of end stage renal disease  secondary to obstructive uropathy since April of 2006, hypothyroidism,  anxiety disorder, longstanding tobacco abuse that was admitted for  elective cardiac catheterization.  Since patient was being evaluated for  coronary artery disease, she was found to have a left main occlusion  along with an occlusion of the RCA, ASD with  left to right shunt, a  normal left ventricular ejection fraction, and left subclavian stenosis.  She is going to be admitted to the hospital for CVTS referral for  possible CABG and will need hemodialysis in house, and thusly we were  consulted.  The patient is not having any current complaints of fevers,  chills, dysuria, shortness of breath, diarrhea, constipation, etc.   PAST MEDICAL HISTORY:  1. 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  Patient gets dialyzed at the [facility].  She is a patient of Dr. Christen Bame.  She gets      dialyzed on Tuesdays, Thursdays, Saturdays through her left arm A-V      fistula with an estimated dry weight of 79.5 kilos and a duration      of dialysis of 180 minutes.  2. Hypothyroidism.  3. Anxiety disorder.  4. History of nephrolithiasis.  5. Longstanding tobacco abuse.  6. Status post cholecystectomy.  7. GERD.  8. Dyslipidemia.  9. Obesity.   ALLERGIES:  No known  drug allergies.   MEDICATIONS:  1. Levothyroxine 0.175 mg daily.  2. _____ 3000 mg daily.  3. Renagel 800 mg daily with meals.  4. Allegra 180 mg daily.  5. Klonopin 0.5 mg 1-2 tablets daily.  6. Zoloft 50 mg daily.  7. Crestor 5 mg daily.  8. Aspirin 81 mg daily.  9. Sublingual nitroglycerine p.r.n. chest pain.   SOCIAL HISTORY:  Patient is married and on disability secondary to her  comorbid conditions.   SUBSTANCE HISTORY:  Patient has smoked half a pack per day since the age  of 31, no history of alcohol or drug use.   FAMILY HISTORY:  Mother passed away at age 35 because of an MI.  Father  passed away at age 36 because of an MI.  One sister, age 15 had an MI.  One sister, age 19 has had CABG.  One brother, age 22 has documented  coronary artery disease.   REVIEW OF SYSTEMS:  As per HPI.  PHYSICAL EXAMINATION:  Vital signs:  Patient is afebrile, pulse 85,  respiration 12, blood pressure 129/57, O2 SAT 95% on room air.  GENERAL EXAM:  Patient is in no acute distress.  CARDIOVASCULAR:  Regular rate and rhythm, no murmurs.  CHEST:  Is clear to auscultation bilaterally.  Patient does have  occasional expiratory wheezing but no crackles.  ABDOMEN:  Is soft with positive bowel sounds.  EXTREMITIES:  No clubbing, cyanosis, or edema.   LABORATORY DATA:  Labs that were done on May 17, 2006 indicated a  hemoglobin of 11.3, white count of 8.5, platelets of 309, sodium 139,  potassium 4.2, chloride 103, bicarb 29, BUN 15, creatinine 4.2, glucose  93.   ASSESSMENT/PLAN:  1. Coronary artery disease with cardiac catheterization revealing      severe stenosis of the left main, 100% occlusion of right coronary      artery, atrial septal defect with significant left to right shunt,      normal left ventricular function of 50%, left subclavian stenosis.      CVTS has been consulted for possible coronary artery bypass      grafting and arterial septal defect closure.  2. End stage  renal disease secondary to obstructive uropathy.  Patient      will get hemodialyzed on Tuesday, Thursday, Saturday on the      inpatient dialysis unit.   Thank you very much for the consult.      Adella Hare, M.D.  Electronically Signed     BP/MEDQ  D:  05/22/2006  T:  05/23/2006  Job:  [id number]

## 2010-11-24 NOTE — Assessment & Plan Note (Signed)
[place] OFFICE NOTE   KEIAIRA, SHALOM                     MRN:          [id number]  DATE:05/17/2006                            DOB:          1961/05/31    PRIMARY CARDIOLOGIST:  Dr. Dola Argyle   REASON FOR OFFICE VISIT:  A scheduled office followup.  Please refer to my  initial consultation of November 2 for full details.   The patient now returns in followup after being referred for further non-  invasive workup with a 2-D echocardiogram, carotid Dopplers, lower extremity  arterial Dopplers and a fasting lipid profile.   Patient initially presented to [place] for evaluation of chest pain, after being  recently hospitalized here at [facility], at which time she ruled out with  negative cardiac markers.  There was 1 initial troponin of 0.04, however.  She was not seen by [place] in consultation, but was subsequently referred for a  post hospital adenosine Cardiolite.  This was interpreted by Dr. Cleatis Polka as having possible scar/ischemia in the inferior wall, but with normal  wall motion and no diagnostic EKG changes; calculated ejection fraction 64%.   Despite a history of atypical chest pain, and in the context of long-  standing history of anxiety disorder, the patient does have multiple carotid  risk factors notable for long-standing tobacco smoking, history of  hyperlipidemia, and a strong family history of coronary artery disease.   Patient also presented to [place] with strong suspicion for peripheral vascular  disease by physical examination.   The 2-D echocardiogram revealed a normal left ventricle with no definite  segmental wall motion abnormalities; there was an ostium secundum ASD with  left-right shunt flow without RV enlargement and/or pulmonary hypertension;  additionally, Dr. Dannielle Burn felt that the atrial septal defect appeared  aneurysmal with left-right shift consistent with left atrial  pressure.   Carotid Dopplers yielded 60%-79% right ICA stenosis with 40%-59% left ICA  stenosis.   Lower extremity arterial Dopplers were suggestive of mild disease with 0.8  ABIs, bilaterally, with triphasic flow.   CURRENT MEDICATIONS:  Unchanged from recent office visit, with the addition  of aspirin 81 daily.   PHYSICAL EXAMINATION:  Blood pressure 140/62, pulse 84, regular.  Weight  174.  GENERAL:  A 50 year old female, moderately obese, sitting upright in no  apparent distress.  HEENT:  Normocephalic.  Atraumatic.  NECK:  Palpable bilateral carotid pulses, bilateral bruits (left greater  than right) as well as supraclavicular bruits.  LUNGS:  Clear to auscultation bilaterally.  HEART:  Regular rate and rhythm (S1, S2).  Grade 3/6 crescendo-decrescendo  murmur heard loudest at the base with a preserved S2; no diastolic flow.  ABDOMEN:  Protuberant, nontender with diffuse abdominal bruits.  EXTREMITIES:  Palpable right femoral pulse with associated bruits;  nonpalpable left femoral pulse; minimally palpable peripheral pulses with no  pedal edema.  NEUROLOGIC:  No focal deficits.   IMPRESSION:  1. Recurrent angina pectoris.      a.     Typical/atypical features with recent low risk adenosine stress  Cardiolite suggestive of possible inferior ischemia, but with normal       wall motion.      b.     Normal left ventricular function both by perfusion imaging and       follow up 2-D echocardiogram.  2. Peripheral vascular disease.      a.     60%-79% right ICA stenosis; 40%-59% left ICA stenosis.      b.     0.8 ABI, bilaterally, with triphasic flow.  3. Secundum ASD.      a.     With left-right shunt consistent with left atrial pressure.  4. Hyperlipidemia.      a.     Total cholesterol 198, triglyceride 190, HDL 48, and LDL 112 by       recent profile.  5. End stage renal disease.      a.     On hemodialysis.  6. Long-standing tobacco smoking.  7. Anxiety  disorder.  8. Obesity.   PLAN:  Following review of the current study results with Dr. Dannielle Burn,  recommendation is to proceed with a right/left cardiac catheterization to  exclude a significant underlying coronary artery disease.  Additionally, we  will also assess the severity of the ASD with an oximetry run and further  evaluation of the left-right shunt.   The results of all of these current studies were reviewed in full with the  patient, as well as our current recommended plan, and she is agreeable to  proceed.  The risk/benefits of the cardiac catheterization were explained as  well.   Arrangements will be made for the patient to have the cardiac  catheterization early next week in the [place] lab.      Gene Serpe, PA-C  Electronically Signed      Ernestine Mcmurray, MD,FACC  Electronically Signed   GS/MedQ  DD: 05/17/2006  DT: 05/18/2006  Job #: [id number]   cc:   Sherie Don, MD

## 2010-11-24 NOTE — Discharge Summary (Signed)
NAMEKARISA, HACKBARTH            ACCOUNT NO.:  [id number]   MEDICAL RECORD NO.:  [id number]          PATIENT TYPE:  INP   LOCATION:  2039                         FACILITY:  [facility]   PHYSICIAN:  Revonda Standard. Roxan Hockey, M.D.DATE OF BIRTH:  07/27/1960   DATE OF ADMISSION:  05/22/2006  DATE OF DISCHARGE:                                 DISCHARGE SUMMARY   PRIMARY DIAGNOSES:  1. Left main three-vessel coronary disease and atrial septal defect.   HOSPITAL DIAGNOSIS:  Postoperative right hydropneumothorax.   SECONDARY DIAGNOSES:  1. End-stage renal disease on hemodialysis Tuesday, Thursday, Saturday.  2. Hypothyroidism.  3. Anxiety disorder.  4. Tobacco abuse.  5. Gastroesophageal reflux disease  6. Hyperlipidemia.  7. Obesity.  8. Status post cholecystectomy.  9. Status post bilateral arm surgery.   OPERATIONS AND PROCEDURES:  1. Cardiac catheterization.  2. Coronary bypass grafting x1 using a left internal mammary artery to      left anterior descending.   THE PATIENT'S HISTORY OF PHYSICAL AND HOSPITAL COURSE:  The patient is a 2-  year female with long history of panic attacks, recent diagnosis of unstable  angina.  Echocardiogram was done and showed an atrial septal defect with the  left-to-right shunt.  This is a condom-type defect and catheterization  showed 90-% ostial left main stenosis and total occlusion of the right  coronary artery.  Following this study, Dr. Roxan Hockey was consulted.  Dr.  Roxan Hockey discussed with the patient and evaluated cardiac  catheterization.  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  Dr. Roxan Hockey discussed the patient's risks and benefits of  undergoing coronary artery bypass grafting.  He discussed the patient  proceeding with an off-pump bypass to prevent clamping the aorta.  He  discussed risks and benefits in detail with the patient.  The patient  acknowledged understanding and agreed to proceed.  Surgery was  scheduled for  May 27, 2006.  Prior to undergoing surgery, the patient did have  bilateral carotid duplex ultrasound done which showed right 58% stenosis.  She also had bilateral ABIs done which showed right 58.1 with left  58.76.  Prior to undergoing surgery, renal was consulted to continue managing the  patient's hemodialysis Tuesday, Thursday, Saturday.  The patient remained  stable prior to surgery.  For details of the patient's past medical history  and physical exam, please see dictated History and Physical.   The patient was taken to the operating room on May 27, 2006, where she  underwent off-pump coronary bypass grafting x1 using a left internal mammary  artery to left anterior descending.  The patient tolerated this procedure  well and was transferred to the intensive care unit in stable condition.  Following surgery, the patient was seen to be alert and seen to be  hemodynamically stable.  She was extubated the evening following surgery.  Following extubation, the patient was noted to be alert and or x3,  neurologically intact.  Postoperatively, the patient made hemodynamically  stable with postop day #1 with hemoglobin of 10, hematocrit 30%.  Chest  tubes had minimal output, and these were discontinued.  Vital signs were  monitored and seen to be stable. Drips were weaned.  The patient was out of  bed.   By postop day #2, the patient was ambulating well.  She was transferred up  to 2000 on postop day #2.  Again, renal continued to follow the patient  during postoperative course and arranged dialysis Tuesday, Thursday,  Saturday.  The a.m. of day #3, chest x-ray obtained showed the patient to  have a large right hydropneumothorax.  After evaluation by Dr. Cyndia Bent, a 23-  French right anterior chest tube was inserted.  The patient tolerated this  well.  Followup chest x-ray showed this to be stable.  Continued followup  chest x-rays remained stable with complete  resolution of the right  hydropneumothorax.  The patient changed wires and still remained stable.  Chest tube was discontinued postop day #6.  On followup chest x-ray, she  remained stable.  Postoperatively, the patient remained in normal sinus  rhythm.  Incisions were clean, dry and intact and healing well.  She was out  of bed, ambulating well with this.  The patient was tolerating regular diet  well.  No nausea, vomiting noted.  Bowel movements within normal limits.  Blood sugars were monitored closely and seemed to be stable.  The patient  does not have history of diabetes mellitus.  Last labs obtained postop day  #6 showed a white count 7.4.  Hemoglobin 11.2, hematocrit 32.4, platelet  count 305.  Sodium of 136, potassium 4.6, chloride 101, bicarbonate 26, BUN  of 26, creatinine 4.8, glucose 85.   The patient was tentatively ready for discharge home in the next 1-2 days.  Next followup appointment will be arranged with Dr. Roxan Hockey in 2 weeks.  Also, appointment will be arranged to follow up with a nurse in 2 weeks for  staple removal.  Our office will contact the patient with this information.  The patient will need to contact Dr. Antionette Char office for a followup  appointment with him in 2 weeks.  She will obtain a PA and lateral chest x-  ray at this appointment. She will then bring this with her to Dr.  Leonarda Salon office.   ACTIVITY:  The patient is instructed no driving until released to do so, no  heavy lifting over 10 pounds.  She is told and ambulate 3-4 times per day,  progress as tolerated, and continue her breathing exercises.   INCISIONAL CARE:  The patient is told she is allowed to shower washing her  incisions using soap and water.  She is to contact the office if she  develops any drainage or opening from any of her incision sites.   DIET:  The patient was educated on diet to be low-fat, low-salt.   DISCHARGE MEDICATIONS:  1. Aspirin 325 mg daily. 2. Lopressor  50 mg b.i.d..  3. Lipitor 80 mg at night.  4. Zoloft 50 mg daily.  5. Allegra 180 mg daily.  6. Levoxyl 0.175 mg daily.  7. Nephro-Vite daily.  8. __________ 400 mg 3 times a day with meals.  9. Klonopin as used at home.  10.Oxycodone 5 mg 1-2 tablets q. 4-6 h.  p.r.n. pain.      Darlin Coco, PA    ______________________________  Revonda Standard Roxan Hockey, M.D.    KMD/MEDQ  D:  06/02/2006  T:  06/02/2006  Job:  [id number]   cc:   Juanda Bond. Burt Knack, MD

## 2010-11-24 NOTE — Assessment & Plan Note (Signed)
[place] OFFICE NOTE   Sherry, Cox                     MRN:          [id number]  DATE:06/19/2006                            DOB:          01-Sep-1960    Sherry Cox is seen in hospital followup today at the [facility].  She is a delightful 50 year old woman who initially  presented for palpitations and cardiac catheterization on November 14.  She had previously been treated for chest pain, which had been  attributed to an anxiety disorder for several years.  She was seen by  Dr. Ron Parker as an outpatient, who was concerned about her symptoms and  scheduled her for a left and right cardiac catheterization.  She had  also been noted on her noninvasive studies to have an atrial septal  aneurysm with color flow across that area suggestive of an atrial septal  defect.   At the time of her diagnostic catheterization she was using sublingual  nitroglycerin on a frequent basis.  She had taken 5 to 6 sublingual  nitroglycerin just on her walk in to the [place] lab.  She also was  experiencing severe chest pain on the table.  Her catheterization  demonstrated critical osteal left main stem disease, as well as an  occluded right coronary artery.  She was admitted from the outpatient  lab, placed on heparin and nitroglycerin, and an emergent CVTS consult  was obtained.  Dr. Roxan Hockey saw the patient and ultimately took her  for an off-pump LIMA to LAD.  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  Her  postoperative course was significant for a right-sided hemothorax, which  was treated with a chest tube.  Other than that, she did remarkably  well, and since discharge home from the hospital, she has done extremely  well.  She has end-stage renal disease and has been  tolerating her  hemodialysis well without any problems.  She has not had any chest pain,  dyspnea, orthopnea, PND, palpitations, light-headedness or syncope since  her discharge home.  She reports that she has been walking approximately  a half mile daily with no symptoms.   The patient's current medications include Renagel 800 mg as directed,  Dialyvite 3000 mg daily, Levoxyl 0.175 mg daily, albuterol as needed,  Crestor 10 mg daily, sertraline daily, fexofenadine 180 mg daily,  NitroQuick 0.4 mg as needed, clonazepam 0.5 mg daily, aspirin 325 mg  daily, zolpidem 5 mg daily, metoprolol 50 mg twice daily.   EXAM:  The patient is alert and oriented.  In no acute distress.  Her weight is 174 pounds, blood pressure 128/68, heart rate is 58,  respiratory rate is 12.  HEENT:  Normal.  NECK:  Normal carotid upstrokes without bruits.  Jugular venous pressure  normal.  LUNGS:  Clear to auscultation bilaterally.  CARDIOVASCULAR:  The apex was discrete and nondisplaced.  The heart is  regular rate and rhythm with a 2/6 harsh systolic  ejection murmur at the  left upper sternal border.  Her midline sternotomy scar appears to be  healing well.  ABDOMEN:  Soft and nontender.  No organomegaly.  EXTREMITIES:  No cyanosis, clubbing, or edema.  Peripheral pulses are 2+  and equal throughout.   EKG:  Sinus bradycardia and otherwise normal.   CHEST X-RAY:  Heart size at the upper limits of normal.  There is no  significant pulmonary vascular congestion, and the lung fields are  grossly clear.   ASSESSMENT:  Sherry Cox is currently stable form a cardiovascular  standpoint.  Her current problems are as follows.  1. Severe coronary artery disease.  The patient has critical left main      disease as well as an occluded right coronary artery.  She has done      remarkably well with single vessel bypass involving an off-pump      left internal mammary artery to left anterior descending performed       by Dr. Roxan Hockey.  I am really quite surprised that she is having      no angina with fairly good activity level.  She should continue      with her medical therapy, which includes aspirin, metoprolol, and      Crestor.  2. Dyslipidemia.  The patient was initially started on Lipitor and has      been changed at some point to Crestor 10 mg daily.  She should have      followup lipids and LFTs in 8 to 12 weeks prior to her next      followup visit.  3. Hypertension.  The patient's blood pressure is currently well-      controlled on metoprolol 50 mg twice daily.  4. End-stage renal disease.  The patient will continue with her      regular dialysis regimen, which she is tolerating without problems.  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  At the time      of her catheterization, I was able to cross her atrial septal      defect and place a catheterization in her left atrium.  She did      have elevated left atrial filling pressures, which are likely      related to her hypertensive heart disease as well as her severe      coronary artery disease at that time.  She did not have a      significant left-to-right shunt on her oxygen saturation run, which      was performed as part of her right heart catheterization.  The      patient could be considered for percutaneous atrial septal defect      closure at some point, but I would defer this for the time being,      as she is just now recovering from bypass surgery.  6. Followup.  [name] will followup with Dr. Roxan Hockey for a      routine post-bypass followup appointment.  As she lives in [place] and      it is difficult for her to obtain transportation here, she prefers      to followup at our [place] [facility].  I will make certain Dr. Dannielle Burn has      a copy of her notes and records so that he can follow her from now      on.  She should be seen in approximately 3 months' time for her  regular followup.    Juanda Bond.  Burt Knack, MD  Electronically Signed    MDC/MedQ  DD: 06/19/2006  DT: 06/19/2006  Job #: [id number]   cc:   Revonda Standard. Roxan Hockey, M.D.  Ernestine Mcmurray, MD,FACC  Sherril Cong

## 2010-11-24 NOTE — Op Note (Signed)
NAME:  Sherry Cox, Sherry Cox            ACCOUNT NO.:  [id number]   MEDICAL RECORD NO.:  [id number]          PATIENT TYPE:  INP   LOCATION:  2316                         FACILITY:  [facility]   PHYSICIAN:  Revonda Standard. Roxan Hockey, M.D.DATE OF BIRTH:  July 31, 1960   DATE OF PROCEDURE:  05/27/2006  DATE OF DISCHARGE:                                 OPERATIVE REPORT   PREOPERATIVE DIAGNOSIS:  Left main and three-vessel coronary disease and  atrial septal defect.   POSTOPERATIVE DIAGNOSIS:  Left main and three-vessel coronary disease and  atrial septal defect.   PROCEDURE:  Median sternotomy, off-pump coronary bypass grafting x1 (left  internal mammary artery to left anterior descending).   SURGEON:  Revonda Standard. Roxan Hockey, M.D.   ASSISTANTValentina Gu. Roxy Manns, M.D.   ANESTHESIA:  General.   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   CLINICAL NOTE:  Sherry Cox is a 50 year old female with a long history of  panic attacks, recently was diagnosed with unstable angina.  Echocardiogram  and shown ASD with a left-to-right shunt.  This was a secundum type defect  and at catheterization she had 90% ostial left main stenosis and total  occlusion of the right coronary.  The patient was referred for consideration  for coronary bypass grafting and ASD closure; however, was noted on  catheterization subsequently also on CT angiogram that she had a porcelain a  ascending aorta, which could not be cannulated clamped were manipulated  safely taking all issues into account with a extensive discussions with  cardiologist and the patient decision was made to proceed with off-pump  coronary bypass grafting as the most element in terms of her survival.  The  patient understood the indications, risks, benefits and alternatives.  She  understood the reasoning for the limited procedure and at this could have  significant future  implications for her.  She understood and accepted risks  and agreed to proceed.   OPERATIVE NOTE:  Sherry Cox was brought to the preop holding area on  05/27/2006.  There the anesthesia service placed lines for monitoring  arterial, central venous and pulmonary arterial pressure.  ECG leads were  placed for continuous telemetry.  Intravenous antibiotics were administered.  She was taken to the operating room, anesthetized and intubated.  A Foley  catheter was placed.  The chest, abdomen and legs were prepped and draped in  usual fashion.  A median sternotomy was performed, noted that the patient  had significant sternal osteoporosis.  She had extensive subcutaneous fatty  tissue and her skin was very thin and atrophic.  The left internal mammary  artery was harvested using standard technique.  The off-pump heparin dose  was given prior to dividing the distal end of the left mammary artery.  The  left mammary was a good-quality vessel approximately 2.5 mm in diameter.  There was excellent flow through the vessel when the distal end was divided.   The pericardium was opened.  The ascending aorta was inspected.  It was a  porcelain aorta which could not  be manipulated safely in any fashion.  The  patient was placed Trendelenburg position and deep pericardial stay sutures  were placed, covered with Rumel tourniquets. Traction was placed on these  deep pericardial sutures.  The apex of the heart rotated anteriorly out of  the chest.  The patient tolerated this well hemodynamically.  The Guidant  off pump bypass system was used.  The suction device was placed on the apex  of the heart.  The stabilizer device was placed to isolate the mid-LAD.  The  patient tolerated this well hemodynamically and had no evidence of ischemia  during the entire procedure.  The arteriotomy was performed, a 1.75 mm shunt  was placed through the arteriotomy.  There was good stabilization and good  visualization.   The left internal mammary artery was beveled at its distal  end and was anastomosed end-to-side to the mid LAD with a running 8-0  Prolene suture.  Prior to placing the final stitch, the shunt was removed  from the LAD.  The bulldog clamp was removed from the left mammary and the  suture then was tied.  The anastomosis was inspect for hemostasis.  The  mammary pedicle was tacked to the epicardial surface of the heart with 6-0  Prolene sutures.  The deep pericardial stay sutures were removed and the  heart was allowed to rest in the normal location.  Epicardial pacing wires  were placed on the right ventricle, right atrium.  The test dose of  protamine was administered and was well tolerated.  The remainder of  protamine was administered without incident.  Hemostasis was achieved.  No  attempt was made to close the pericardium.  A mediastinal and left pleural  chest tube placed separate subcostal incisions.  The sternum was closed with  a combination of single and double heavy gauge stainless steel wires.  Multiple 2-0 Vicryl interrupted sutures were used to reapproximate the  subcutaneous fatty tissue.  The skin was too thin to use a subcuticular  suture.  Therefore, staples were used to close the skin.  All sponge, needle  and instrument counts were correct at the end of the procedure.  She was  taken from the operating room to the surgical intensive care unit in  critical but stable condition.           ______________________________  Revonda Standard Roxan Hockey, M.D.     SCH/MEDQ  D:  05/27/2006  T:  05/28/2006  Job:  [id number]   cc:   Sherry Bond. Burt Knack, MD  Sherry Bjornstad, MD, [facility]  Sherie Don MD

## 2010-11-24 NOTE — Assessment & Plan Note (Signed)
[facility] OFFICE NOTE   Sherry Cox, Sherry Cox                     MRN:          [id number]  DATE:10/07/2006                            DOB:          1960/08/11    Ms. Schwandt presented with significant chest pain.  We decided to  obtain carotid Dopplers and a 2D echo and see her for early followup,  and this decision was made on May 10, 2006.  She was in fact seen  for early followup on May 17, 2006.  The 2D had shown good wall  motion but there was an ostium secundum ASD with some left to right  flow.  There was no enlargement of the RV and there was no pulmonary  hypertension.  Carotid Dopplers showed 60% - 70% right internal carotid  and 40% - 59% left internal carotid.  Her ABIs reviewed were 0.8.  Decision was made to proceed with cardiac catheterization.  The cath was  done and she had severe pain in the cath lab.  There was critical ostial  left main disease and an occluded right coronary artery.  She was  admitted and she had a porcelain aorta.  She was not a candidate for an  on-pump procedure that would require cross clamping.  She therefore  received an off-pump LIMA to the LAD by Dr. Roxan Hockey.  She has done  well.  She is not having any marked recurrent chest discomfort.  She  does have problems feeling anxious.  That is when she had her chest pain  in the past.  Since then she tells me she has had some mild anxiety but  no chest pain.  We will need to push her medical therapy very carefully.  We are pleased that she is not having any marked angina.  Dr. Burt Knack had  crossed her ASD.  She did have an elevated left atrial pressure due to  her hypertensive disease.  She did not have significant left to right  shunt on her O2 sat.  She could be considered for percutaneous atrial  septal closure, but not at this point.   Today she is feeling relatively well.  She is going about  reasonable  activities.  As mentioned, she has had some mild anxiety but has not had  any marked chest pain.   PAST MEDICAL HISTORY:  ALLERGIES:  NO KNOWN DRUG ALLERGIES.   MEDICATIONS:  Levothyroxine, a vitamin, Renagel, Allegra, clonazepam, Zoloft, aspirin,  metoprolol, and Crestor.   OTHER MEDICAL PROBLEMS:  See the list below.   REVIEW OF SYSTEMS:  She is having some sleeping difficulty.  Otherwise  her review of systems is negative.   PHYSICAL EXAMINATION:  Blood pressure is 120/78 with a pulse of 75.  Her  weight is 177 pounds.  LUNGS:  Clear, respiratory effort is not labored.  She has no xanthelasma.  There is normal extraocular motion.  There is a  right carotid bruit.  CARDIAC:  Reveals an S1 with an S2.  There is a 2/6 systolic murmur.  ABDOMEN:  Obese.  She has no significant peripheral  edema.   EKG today reveals sinus rhythm.   PROBLEMS:  1. Good left ventricular function.  2. Sixty to seventy percent right internal carotid artery stenosis,      and 40% - 59% left internal carotid artery stenosis.  3. Ankle-brachial indices that are 0.8 bilaterally.  4. Secundum atrial septal defect with left to right shunt that is      small.  We will have to consider closure in the future.  5. Hyperlipidemia, being treated.  6. End-stage renal disease, on dialysis.  7. Smoking, which she has stopped.  8. Anxiety disorder.  9. Obesity.  10.Coronary artery disease.  She is post off-pump left internal      mammary artery to her left anterior descending.  She has an      occluded right coronary artery that could not be approached.   Fortunately she is not having any marked angina at this time.  We will  have to re-review her anatomy over time.  Cardiac status is stable.  I  will see her back in 3 months for followup.  We did give her some Ambien  for her sleep.     Carlena Bjornstad, MD, [facility]  Electronically Signed    JDK/MedQ  DD: 10/07/2006  DT: 10/07/2006  Job #:  [id number]   cc:   Sherie Don, MD

## 2010-11-24 NOTE — Cardiovascular Report (Signed)
NAME:  Sherry Cox, Sherry Cox            ACCOUNT NO.:  [id number]   MEDICAL RECORD NO.:  [id number]          PATIENT TYPE:  OIB   LOCATION:  1961                         FACILITY:  [facility]   PHYSICIAN:  Juanda Bond. Burt Knack, MD  DATE OF BIRTH:  07-05-1961   DATE OF PROCEDURE:  DATE OF DISCHARGE:  05/22/2006                              CARDIAC CATHETERIZATION   DATE OF PROCEDURE:  May 22, 2006.   PROCEDURE:  Left heart catheterization, right heart catheterization, LIMA  angiography, left subclavian angiography, left ventricular angiography, and  aortic root angiography.   INDICATION:  Ms. Yarish is a 50 year old woman with end stage renal  disease who presents with severe chest pain.  She has taken 2 bottles of  nitroglycerine in the last 2 weeks.  She had a adenosine Cardiolite study  that suggested inferior wall scar with possible ischemia.  She also had an  echocardiogram that demonstrated an atrial septal aneurysm with an atrial  septal defect present.  She was referred for right and left heart  catheterization to evaluate her coronary artery anatomy and hemodynamics.   PROCEDURAL DETAILS:  Risks and indications of the procedure were explained  in detail to the patient.  Informed consent was obtained.  The right groin  was prepped, draped, and anesthetized with 1% lidocaine.  Using the modified  Seldinger technique, a 5 French arterial sheath was placed in the right  common femoral artery and a 7 French venous sheath was placed in the right  femoral vein.  Initially, I attempted to image the left coronary artery with  a 5 [place] JL4 catheter, that was unsuccessful.  I then changed to a 5  [place] JL35 catheter.  After some difficulty, I was able to engage the left  coronary artery but there was severe pressure damping with a 100 mm pressure  gradient between the aorta and left main stem.  I elected to downsize the  catheter to a 4 [place] and was able to selectively engage the left  coronary  artery.  There was still an 80 mm pressure gradient and I took 1 image of  the left coronary artery and then quickly pulled the catheter out of the  vessel as the patient immediately had chest pain.   I attempted to image the right coronary artery with a 4 [place] JR4 catheter.  This was unsuccessful.  There was heavy calcium around the area of the right  coronary ostium and I elected to do a non selective image with an aortogram  using a pigtail catheter.  The aortogram demonstrated no flow in the right  coronary artery territory.   The pigtail catheter was initially inserted into the left ventricle and left  ventricular pressures were recorded.  A 30 degree RAO left ventriculogram  was performed, a pull back across the aortic valve was done, then a 40  degree LAO aortogram was performed.  Following this, the JR4 catheter was  reinserted up into the left subclavian artery and the LIMA was selectively  imaged.  Following selective LIMA angiography, it was noted that there was a  40 mm pressure gradient  on pull back and I reinserted the JR4 catheter into  the left subclavian artery.  I attempted to take an image of the left  subclavian ostium but this was poorly visualized.   Following the left heart catheterization, I inserted a Swan-Ganz catheter  into the right heart and we did an oxygen saturation run in the superior  vena cava, left pulmonary artery, and aorta.  Pressures were recorded  throughout the right heart chambers from the right atrium to the pulmonary  capillary wedge position.  I then attempted to cross the atrial septal  defect with a 4 [place] JR4 catheter and this was unsuccessful.  I inserted a  multi purpose catheter into the right heart and was able to cross the septal  defect and left atrial pressures were recorded and a saturation was checked  there.   FINDINGS:  Right atrial pressure:  A-wave 8, V-wave 7, mean of 6.  Right  ventricular pressure:  36/5  with an end diastolic pressure of 8.  Pulmonary  artery pressure:  28/16 with a mean pressure of 21.  Pulmonary capillary  wedge pressure is 18/15 with a mean of 14.  Left atrial pressure is 18/5  with a mean of 11.  Left ventricular pressure is 141/7 with an end diastolic  pressure of 11.  Aortic pressure is 136/57 with a mean of 90.   Oxygen saturations:  Superior vena cava saturation was 75%, pulmonary artery  saturation was 65%, both of these were checked on multiple occasions.  This  was the average reading.  Left atrial pressure 91, aortic pressure 90.   Angiography:  The LIMA is a large caliber vessel suitable for a vascular  conduit.  The left subclavian artery is poorly visualized but there is a  suggestion of ostial stenosis.   Aortogram demonstrates normal aortic root size with mild aortic  insufficiency.  There is no visualization of the right coronary artery with  the aortic root injection.   Coronary angiography:  There is only 1 image of the left coronary artery.  There is a suggestion of high grade ostial left main stem disease.  The left  main stem bifurcates into the LAD and left circumflex.   The LAD is heavily calcified throughout its proximal and mid portion.  It  courses down to the left ventricular apex.  It gives off 1 significant  diagonal branch.  The LAD is poorly visualized and I am unable to assess  areas of stenosis but the distal vessel appears to be a reasonable target.   The left circumflex is a large caliber vessel that courses down and gives  off a first obtuse marginal branch and a large posterolateral branch.  I do  not visualize any high grade disease in the left circumflex system.   Left ventriculogram demonstrates normal left ventricular function with a  left ventricular ejection fraction of 50%.  There is no mitral  regurgitation.   ASSESSMENT:  1. Critical left main stem disease.  2. Occluded right coronary artery. 3. Atrial septal  defect without significant left to right shunt.  4. Normal left ventricular function.  5. Left subclavian stenosis.   DISCUSSION:  The patient clearly has severe coronary artery disease.  In the  setting of her frequent angina and suggestion of high grade left main stem  disease, I have elected to admit her to the hospital.  I started her on an  intravenous nitroglycerine drip during the procedure because she was having  severe chest pain.  Her chest pain resolved after the nitroglycerine was  started.  We will place a CVTS consult for coronary bypass surgery and ASD  closure.  She may benefit from a coronary CT angiogram to better define her  targets as they were poorly visualized on this catheterization study.  I was  unable to take more images due to severe pressure damping, even with a 4  French catheter in the left main stem.  There is a suggestion of left  subclavian stenosis but I do not believe that it is severe as her pressure  beyond the stenosis was reasonable at 115 mmHg and her pressure wave form  was not ventricularized.  If she is felt to be a candidate for bypass and  has problems with ischemia, she could certainly be considered for a left  subclavian stent.   As above, the patient will be admitted to the hospital.  She is on an IV  nitroglycerine drip.  Heparin will be initiated 6 hours after her sheath is  out.  And a CVTS consult is placed.      Juanda Bond. Burt Knack, MD  Electronically Signed     MDC/MEDQ  D:  05/22/2006  T:  05/22/2006  Job:  [id number]   cc:   Carlena Bjornstad, MD, [facility]

## 2010-11-24 NOTE — Op Note (Signed)
[name], Sherry Cox            ACCOUNT NO.:  [id number]   MEDICAL RECORD NO.:  [id number]          PATIENT TYPE:  OIB   LOCATION:  2899                         FACILITY:  [facility]   PHYSICIAN:  Judeth Cornfield. Scot Dock, M.D.DATE OF BIRTH:  24-May-1961   DATE OF PROCEDURE:  01/12/2005  DATE OF DISCHARGE:                                 OPERATIVE REPORT   PREOPERATIVE DIAGNOSIS:  Chronic renal failure.   POSTOPERATIVE DIAGNOSIS:  Chronic renal failure.   PROCEDURE:  Placement of the left upper arm AV fistula.   SURGEON:  Judeth Cornfield. Scot Dock, M.D.   ASSISTANT:  Nurse.   ANESTHESIA:  Local with sedation.   TECHNIQUE:  The patient was taken to the operating room and sedated by  anesthesia. The left upper extremity was prepped and draped in usual sterile  fashion. After the skin was infiltrated with 1% lidocaine a transverse  incision was made at the antecubital level. Here, the cephalic vein was  dissected free. It was an adequate size vein, although somewhat small.  It  did distend up with heparinized saline after it was ligated and divided. The  brachial artery was dissected free from beneath the fascia. It too was  small, but felt to be adequate size. I did not think the artery was adequate  size to support an AV graft, however. I thought the best option was to  attempt an AV fistula.   The patient was heparinized. The brachial artery was clamped proximally and  distally, and a longitudinal arteriotomy was made. The vein was mobilized  over and sewn end-to-side to the artery using continuous 6-0 Prolene suture.  At the completion to prevent any kinking, I divided some of the biceps  muscle. There was 1 branch which I clipped, to prevent any competitive flow.  The fistula had a good thrill. Hemostasis was obtained in the wound and the  heparin was partially reversed with protamine. The wound was closed with a  deep layer of 3-0 Vicryl. The skin closed with 4-0 Vicryl. Sterile  dressing  was applied. The patient tolerated the procedure well and was transferred to  recovery room in satisfactory condition. All needle and sponge counts were  correct.       CSD/MEDQ  D:  01/12/2005  T:  01/12/2005  Job:  [id number]

## 2010-11-24 NOTE — Consult Note (Signed)
NAMEPRESLI, HEPPLER            ACCOUNT NO.:  [id number]   MEDICAL RECORD NO.:  [id number]          PATIENT TYPE:  INP   LOCATION:  2917                         FACILITY:  [facility]   PHYSICIAN:  Revonda Standard. Roxan Hockey, M.D.DATE OF BIRTH:  12-13-1960   DATE OF CONSULTATION:  05/22/2006  DATE OF DISCHARGE:                                   CONSULTATION   DATE OF CONSULTATION:  May 22, 2006   REASON FOR CONSULTATION:  Left main and 3-vessel coronary disease and atrial  septal defect.   HISTORY OF PRESENT ILLNESS:  Ms. Mundine is a 50 year old female with end-  stage renal failure, anxiety disorder, hypothyroidism, tobacco abuse,  dyslipidemia, and obesity.  She also has severe atherosclerotic  cardiovascular disease.  She recently had an episode of chest pain.  She has  been having anxiety attacks for about 3 years and about the past 18 months  or so she has noted occasional chest pain and pressure in association with  these anxiety attacks.  She recently presented to the emergency room after  having a severe episode while riding as a passenger in a car.  She thought  this was an anxiety attack.  She stated that this pain in her chest was an  8/10.  There was diaphoresis, dyspnea, nausea, and vomiting.  It did not  radiate.  She was given nitroglycerin spray with relief and taken to the  emergency room.  She ruled out for a myocardial infarction and had an  adenosine Cardiolite which showed possible scar versus ischemia in the  inferior wall, but normal wall motion and an ejection fraction of 64%.  She  was, however, noted to have a heart murmur and that led to a 2D  echocardiogram that showed normal left ventricular function.  There was an  ostium secundum atrial septal defect with left to right shunt.  Further  workup also revealed 60% to 80% right internal carotid stenosis and a 40% to  60% left internal carotid stenosis.  Her ABIs were 0.8 bilaterally.  Today  she underwent  elective cardiac catheterization, where she was found to have  normal right-sided pressures.  She had a total occlusion of her right  coronary and an ostial left main lesion with damping with a 4-French  catheter and 80 mm pressure gradient.  She also had a left subclavian  stenosis with a 40 mmHg pressure gradient.   She did have chest pain during and after catheterization.  She currently is  pain free on IV nitroglycerin and heparin infusions.   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   PAST MEDICAL HISTORY:  Significant for:  1. 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      She has been on dialysis for 18 months.  2. Hypothyroidism.  3. Anxiety disorder.  4. Tobacco abuse.  5. Gastroesophageal reflux.  6. Dyslipidemia.  7. Obesity.  8. History of cholecystectomy.  9. Bilateral arm surgery.   CURRENT MEDICATIONS:  1. Levoxyl 0.175 mg daily.  2. Dialyvite 3000 mg  daily.  3. Renagel 800 mg tablets, 3 tablets with each snack and 5 tablets with      each meal.  4. Allegra 180 mg daily.  5. Klonopin 0.5 mg 1 to 1-1/2 tablets daily.  6. Zoloft 50 mg daily.  7. Vasotec 5 mg daily.  8. Aspirin 81 mg daily.   SHE HAS NO KNOWN DRUG ALLERGIES.   FAMILY HISTORY:  Significant for atherosclerotic cardiovascular disease,  premature and multiple siblings and her father.   SOCIAL HISTORY:  She smokes about a half a pack a day currently, though  smoking up to a pack a day at one time.  She smoked for over 30 years.  She  does not use ethanol.  She is married.  She does not work.   REVIEW OF SYSTEMS:  Anxiety, chest pain, shortness of breath, difficulty  climbing a flight of stairs, reflux.  Denies any excessive bleeding or  bruising.  Denies any previous blood clots.  No stroke or TIA symptoms.  All  other systems are negative.   PHYSICAL EXAMINATION:  GENERAL:  Ms.  Piechota is a 50 year old obese female,  4 feet 10 inches tall and 170 pounds.  She is in no acute distress.  General appearance is obese.  HEENT:  Normocephalic, atraumatic.  NECK:  Supple.  There is bilateral carotid bruits.  There is no thyromegaly  or adenopathy.  LUNGS:  Clear.  CARDIAC:  Regular rate and rhythm.  There is a 3/6 murmur heard the loudest  at the left upper sternal border.  ABDOMEN:  Soft, nontender.  EXTREMITIES:  There is no clubbing, cyanosis, or edema.  I am not able to  palpate dorsalis pedis or posterior tibial pulses on either side.  SKIN:  Warm and dry.   LABORATORY DATA:  PTT is 30, PT 14.1.  White count 7.4, hematocrit 29,  platelets 248.  Sodium 135, potassium 4.0, BUN and creatinine are 12 and  3.8.  Glucose is 132.  LFTs within normal limits.  Albumin is 3.2.  CK was  73 with an MB of 1.2.  Troponin was 0.05.  BNP level was 63.   IMPRESSION:  Ms. Mckell is a very complex 50 year old patient with  critical left main and 3-vessel coronary disease.  Unfortunately she has had  several issues which complicate her case.  One is the arterial septal  defect, which will need to be repaired at the time of surgery and is not a  significant factor, as her right heart pressures are normal.  More  importantly, she has severe atherosclerotic cardiovascular disease with a  porcelain ascending aorta, which may preclude cannulating, clamping, or  placement of proximal anastomosis.  She also has likely a left subclavian  stenosis, which was not visualized, but does have a 40 mm pressure gradient  with catheter pullback, which would compromise the left internal mammary as  a graft and particularly this cannot be used as a free graft and that is a  vital issue as to whether or not that might be able to be addressed.   I strongly believe that we need a CT angiogram to further assess her aorta and great vessels to see if there are any potential sites for cannulation   clamping and proximal anastomoses and what alternative sites for cannulation  might be available.  It would also be helpful, if possible at the same time,  to get CT angiogram images of her coronary arteries, particularly in regards  to the  extent of disease within her left system and whether there would be  any graftable targets in the right system.  This all may be a moot point if  there is no placed to land grafts on the aorta, but would help in operative  decision making.  I will discuss these issues with [facility]  and hopefully we can do the CT angiogram tomorrow.  She likely also will  need dialysis tomorrow, as she was catheterized today.  This is a very  difficult case, which would be extremely high risk, particularly in regards  to stroke risk, regardless of which approach is selected.           ______________________________  Revonda Standard. Roxan Hockey, M.D.     SCH/MEDQ  D:  05/22/2006  T:  05/23/2006  Job:  [id number]   cc:   Juanda Bond. Burt Knack, MD  Carlena Bjornstad, MD, [facility]  Sherril Cong, MD

## 2010-12-08 ENCOUNTER — Telehealth: Payer: Self-pay | Admitting: Cardiology

## 2010-12-08 DIAGNOSIS — Z79899 Other long term (current) drug therapy: Secondary | ICD-10-CM

## 2010-12-08 DIAGNOSIS — E782 Mixed hyperlipidemia: Secondary | ICD-10-CM

## 2010-12-08 MED ORDER — SIMVASTATIN 40 MG PO TABS: 40.0000 mg | ORAL_TABLET | Freq: Every day | ORAL | Status: DC

## 2010-12-08 NOTE — Telephone Encounter (Signed)
Pt needs zocar 40 mg #90 to be call in to [facility] # [phone]

## 2010-12-08 NOTE — Telephone Encounter (Signed)
Pt is aware she needs FLP/LFT done for further refills. Refill send for #45. Pt will have labs done next week.

## 2010-12-12 ENCOUNTER — Encounter: Payer: Self-pay | Admitting: Cardiology

## 2010-12-13 ENCOUNTER — Encounter (HOSPITAL_COMMUNITY): Payer: Medicare Other | Admitting: Psychiatry

## 2010-12-20 ENCOUNTER — Encounter (INDEPENDENT_AMBULATORY_CARE_PROVIDER_SITE_OTHER): Payer: Medicare Other | Admitting: Psychiatry

## 2010-12-20 DIAGNOSIS — F411 Generalized anxiety disorder: Secondary | ICD-10-CM

## 2010-12-20 DIAGNOSIS — F329 Major depressive disorder, single episode, unspecified: Secondary | ICD-10-CM

## 2011-01-16 ENCOUNTER — Encounter (HOSPITAL_COMMUNITY): Payer: Medicare Other | Admitting: Psychiatry

## 2011-01-17 ENCOUNTER — Encounter (HOSPITAL_COMMUNITY): Payer: Medicare Other | Admitting: Psychiatry

## 2011-01-23 ENCOUNTER — Encounter (INDEPENDENT_AMBULATORY_CARE_PROVIDER_SITE_OTHER): Payer: Medicare Other | Admitting: Psychiatry

## 2011-01-23 DIAGNOSIS — F329 Major depressive disorder, single episode, unspecified: Secondary | ICD-10-CM

## 2011-01-31 ENCOUNTER — Encounter (INDEPENDENT_AMBULATORY_CARE_PROVIDER_SITE_OTHER): Payer: Medicare Other | Admitting: Psychiatry

## 2011-01-31 DIAGNOSIS — F329 Major depressive disorder, single episode, unspecified: Secondary | ICD-10-CM

## 2011-02-09 ENCOUNTER — Ambulatory Visit: Payer: Self-pay

## 2011-02-09 ENCOUNTER — Other Ambulatory Visit: Payer: Self-pay

## 2011-02-23 ENCOUNTER — Ambulatory Visit (INDEPENDENT_AMBULATORY_CARE_PROVIDER_SITE_OTHER): Payer: Medicare Other | Admitting: Thoracic Diseases

## 2011-02-23 ENCOUNTER — Other Ambulatory Visit (INDEPENDENT_AMBULATORY_CARE_PROVIDER_SITE_OTHER): Payer: Medicare Other

## 2011-02-23 ENCOUNTER — Encounter: Payer: Self-pay | Admitting: Thoracic Diseases

## 2011-02-23 VITALS — BP 124/43 | HR 70

## 2011-02-23 DIAGNOSIS — I779 Disorder of arteries and arterioles, unspecified: Secondary | ICD-10-CM

## 2011-02-23 DIAGNOSIS — I6529 Occlusion and stenosis of unspecified carotid artery: Secondary | ICD-10-CM

## 2011-02-23 NOTE — Progress Notes (Signed)
VASCULAR AND VEIN SURGERY CAROTID FOLLOW-UP  HPI: Sherry Cox is a 50 y.o. female right handed who has known carotid disease. Patient is doing well. Pt. has not had surgical intervention.  Patient has Negative history of TIA or stroke symptom.  The patient denies amaurosis fugax or monocular blindness.  The patient  denies facial drooping. Pt. denies hemiplegia.  The patient denies receptive or expressive aphasia.  Pt. denies weakness; BUE/BLE;  Pt has strong family HX of CAD with CABG at young age in she and a younger sister. Her father died at 46 after having several strokes  Non-Invasive Vascular Imaging CAROTID DUPLEX (Date: 02/23/2011):  Right ICA 60 - 79 % stenosis with PSV of 228 and EDV of 61 which is stable; Left ICA 40 - 59 % stenosis with PSV of 267 and EDV of 41 which is sig. Higher than 6 months ago when values were 105 and 23  ECA PSV is higher on both sides : Right 235 and Left 376  Physical Exam: Filed Vitals:   02/23/11 1634  BP: 124/43  Pulse: 70  SpO2: 97%    Pt is A&O x 3 Gait is normal HEART : RRR WITH 3/6 SEM Neuro exam intact  Bilateral carotid bruit/s vs transmission of systolic heart murmur Speech is fluent GOOD AND EQUAL STRENGTH  ALL EXTREMITIES Negative tongue deviation Negative facial droop  Assessment: stable carotid duplex on right; increased velocities on left but still less than 60% stenosis Plan: Follow-up in 6 months with Carotid Duplex scan   Pt was given information regarding stroke symptoms and prevention  Clinic MD: Trula Slade on call

## 2011-02-26 ENCOUNTER — Encounter (INDEPENDENT_AMBULATORY_CARE_PROVIDER_SITE_OTHER): Payer: Medicare Other | Admitting: Psychiatry

## 2011-02-26 DIAGNOSIS — F329 Major depressive disorder, single episode, unspecified: Secondary | ICD-10-CM

## 2011-02-26 DIAGNOSIS — F411 Generalized anxiety disorder: Secondary | ICD-10-CM

## 2011-03-09 NOTE — Procedures (Unsigned)
CAROTID DUPLEX EXAM  INDICATION:  Followup carotid stenosis.  HISTORY: Diabetes:  No. Cardiac:  CAD, CABG. Hypertension:  Yes. Smoking:  Currently. Previous Surgery:  No carotid intervention; left upper extremity AVF placed in 2004. CV History:  Asymptomatic. Amaurosis Fugax No, Paresthesias No, Hemiparesis No                                      RIGHT             LEFT Brachial systolic pressure:         112               N/A Brachial Doppler waveforms:         WNL Vertebral direction of flow:        Antegrade         Antegrade DUPLEX VELOCITIES (cm/sec) CCA peak systolic                   111-M/145-D       99991111 ECA peak systolic                   235               [id number] ICA peak systolic                   228               99991111 ICA end diastolic                   61                41 PLAQUE MORPHOLOGY:                  Heterogeneous     Heterogeneous PLAQUE AMOUNT:                      Moderate to severe                  Moderate PLAQUE LOCATION:                    CCA, ICA, ECA     CCA, ICA, ECA  IMPRESSION: 1. Bilateral common carotid artery disease with elevated velocities at     the distal segment, the left suggestive of hemodynamically     significant disease. 2. Right internal carotid artery stenosis in the 60%-79% range. 3. Left internal carotid artery stenosis in the 40%-59% range. 4. Bilateral external carotid artery stenosis is present. 5. Essentially unchanged since previous study on 07/21/2010.  ___________________________________________ Jessy Oto. Fields, MD  SH/MEDQ  D:  02/23/2011  T:  02/23/2011  Job:  [id number]

## 2011-03-20 ENCOUNTER — Encounter (INDEPENDENT_AMBULATORY_CARE_PROVIDER_SITE_OTHER): Payer: Medicare Other | Admitting: Psychiatry

## 2011-03-20 DIAGNOSIS — F3289 Other specified depressive episodes: Secondary | ICD-10-CM

## 2011-03-20 DIAGNOSIS — F329 Major depressive disorder, single episode, unspecified: Secondary | ICD-10-CM

## 2011-03-26 ENCOUNTER — Encounter (INDEPENDENT_AMBULATORY_CARE_PROVIDER_SITE_OTHER): Payer: Medicare Other | Admitting: Psychiatry

## 2011-03-26 DIAGNOSIS — F329 Major depressive disorder, single episode, unspecified: Secondary | ICD-10-CM

## 2011-03-26 DIAGNOSIS — F411 Generalized anxiety disorder: Secondary | ICD-10-CM

## 2011-04-06 ENCOUNTER — Encounter: Payer: Self-pay | Admitting: Cardiology

## 2011-04-08 ENCOUNTER — Encounter: Payer: Self-pay | Admitting: Cardiology

## 2011-04-08 DIAGNOSIS — I2510 Atherosclerotic heart disease of native coronary artery without angina pectoris: Secondary | ICD-10-CM | POA: Insufficient documentation

## 2011-04-09 ENCOUNTER — Encounter: Payer: Self-pay | Admitting: Cardiology

## 2011-04-09 ENCOUNTER — Ambulatory Visit (INDEPENDENT_AMBULATORY_CARE_PROVIDER_SITE_OTHER): Payer: Medicare Other | Admitting: Cardiology

## 2011-04-09 DIAGNOSIS — I6529 Occlusion and stenosis of unspecified carotid artery: Secondary | ICD-10-CM

## 2011-04-09 DIAGNOSIS — R9430 Abnormal result of cardiovascular function study, unspecified: Secondary | ICD-10-CM | POA: Insufficient documentation

## 2011-04-09 DIAGNOSIS — F17200 Nicotine dependence, unspecified, uncomplicated: Secondary | ICD-10-CM

## 2011-04-09 DIAGNOSIS — R011 Cardiac murmur, unspecified: Secondary | ICD-10-CM

## 2011-04-09 DIAGNOSIS — I2510 Atherosclerotic heart disease of native coronary artery without angina pectoris: Secondary | ICD-10-CM

## 2011-04-09 DIAGNOSIS — Z720 Tobacco use: Secondary | ICD-10-CM

## 2011-04-09 NOTE — Assessment & Plan Note (Signed)
Coronary disease is stable.  No further workup is needed.

## 2011-04-09 NOTE — Assessment & Plan Note (Signed)
Patient continues to smoke one fourth pack per day.  I have counseled her to stop her

## 2011-04-09 NOTE — Assessment & Plan Note (Signed)
The patient has significant carotid artery disease.  She is followed carefully by vascular surgery.

## 2011-04-09 NOTE — Patient Instructions (Signed)
   Echo If the results of your test are normal or stable, you will receive a letter.  If they are abnormal, the nurse will contact you by phone. Your physician wants you to follow up in: 6 months.  You will receive a reminder letter in the mail one-two months in advance.  If you don't receive a letter, please call our office to schedule the follow up appointment  

## 2011-04-09 NOTE — Progress Notes (Signed)
HPI The patient is seen today to followup coronary artery disease.  I saw her last March, 2012.  She has a history of off pump CABG with LIMA to the LAD in November, 2007.  She had a stress echo in May, 2010 revealing no ischemia.  She's not having any chest pain.  She's doing well overal. The patient had told me that she was to have her kidneys removed.  She had viral illnesses and this has not yet been done.  She is on dialysis and stable.   No Known Allergies  Current Outpatient Prescriptions  Medication Sig Dispense Refill  . albuterol (PROAIR HFA) 108 (90 BASE) MCG/ACT inhaler Inhale 2 puffs into the lungs as needed.        Marland Kitchen aspirin 325 MG tablet Take 325 mg by mouth daily.        . clonazePAM (KLONOPIN) 0.5 MG tablet Take 1 tablet by mouth daily.      . folic acid-vitamin b complex-vitamin c-selenium-zinc (DIALYVITE) 3 MG TABS Take 1 tablet by mouth daily.        Marland Kitchen HYDROcodone-acetaminophen (VICODIN) 5-500 MG per tablet Take one tablet twice a day       . levothyroxine (SYNTHROID, LEVOTHROID) 150 MCG tablet Take 1 tablet by mouth Daily.      . metoprolol (LOPRESSOR) 50 MG tablet Take 1 tablet (50 mg total) by mouth 2 (two) times daily.  180 tablet  3  . nitroGLYCERIN (NITROSTAT) 0.4 MG SL tablet Place one tablet under tongue every 5 minutes up to 3 doses as needed for chest pain. No more than 3 doses over a 15 minute period.  75 tablet  3  . sertraline (ZOLOFT) 50 MG tablet Take 1 tablet by mouth every morning.        . sevelamer (RENVELA) 800 MG tablet Take 5 tablets by mouth with meals and 3 tablets by mouth with snacks.       . simvastatin (ZOCOR) 40 MG tablet Take 1 tablet (40 mg total) by mouth at bedtime. PATIENT NEED CHOLESTEROL LABS  45 tablet  0  . sodium bicarbonate 650 MG tablet Take 1 tablet by mouth two times a day.       . zolpidem (AMBIEN) 5 MG tablet Take 5 mg by mouth at bedtime as needed.          History   Social History  . Marital Status: Married    Spouse Name:  N/A    Number of Children: N/A  . Years of Education: N/A   Occupational History  . DISABLED    Social History Main Topics  . Smoking status: Current Some Day Smoker -- 0.3 packs/day for 30 years    Types: Cigarettes  . Smokeless tobacco: Never Used   Comment: 4 ciggs per day  . Alcohol Use: No  . Drug Use: No  . Sexually Active: Not on file   Other Topics Concern  . Not on file   Social History Narrative  . No narrative on file    No family history on file.  Past Medical History  Diagnosis Date  . Overweight   . Anxiety disorder   . Carotid artery stenosis     12/10/2008 doppler 0000000 RICA, [id number] LICA/see evaluation by Dr. Oneida Alar 2011  . CAD (coronary artery disease)     Total RCA, stress echo [facility] 11/2008-no ischemia, EF 55%; h/o CABG  . Hyperlipidemia   . ESRD on dialysis     Transplant consideration  .  Tobacco abuse   . Hx of CABG     Off pump LIMA to LAD  05/2006  . Ejection fraction     EF 60-65%, echo, June, 2009  . Murmur     October, 2012    Past Surgical History  Procedure Date  . Cholecystectomy   . Coronary artery bypass graft 05/2006    Off pump LIMA to LAD    ROS  Patient denies fever, chills, headache, sweats, rash, change in vision, change in hearing, chest pain, cough, nausea vomiting, urinary symptoms.  All other systems are reviewed and are negative.  PHYSICAL EXAM Patient is oriented to person time and place.  Affect is normal.  Head is atraumatic.  There is no xanthelasma.  The patient has carotid bruits.  There is no jugular venous distention.  Lungs are clear.  Respiratory effort is not labored.  Cardiac exam reveals S1-S2.  There are no clicks.  There is a 3/6 systolic murmur.  The abdomen is soft there is no peripheral edema.  The patient is overweight.  There are no musculoskeletal deformities.  There are no skin rashes. Filed Vitals:   04/09/11 0858  BP: 118/51  Pulse: 78  Height: 4\' 10"  (1.473 m)  Weight: 195 lb (88.451 kg)    EKG is not done today.  ASSESSMENT & PLAN

## 2011-04-09 NOTE — Assessment & Plan Note (Signed)
The patient's systolic murmur seems different today.  I have reviewed her echo report from June, 2009.  At that time she had normal left ventricular function.  There is no mention of any significant murmurs.  We'll schedule a two-dimensional echo to reassess this issue.

## 2011-04-16 ENCOUNTER — Other Ambulatory Visit: Payer: Self-pay | Admitting: Cardiology

## 2011-04-16 MED ORDER — SIMVASTATIN 40 MG PO TABS: 40.0000 mg | ORAL_TABLET | Freq: Every day | ORAL | Status: DC

## 2011-04-16 NOTE — Telephone Encounter (Signed)
Refill of zocor 40 mg

## 2011-04-18 ENCOUNTER — Other Ambulatory Visit (INDEPENDENT_AMBULATORY_CARE_PROVIDER_SITE_OTHER): Payer: Medicare Other | Admitting: *Deleted

## 2011-04-18 DIAGNOSIS — R011 Cardiac murmur, unspecified: Secondary | ICD-10-CM

## 2011-04-18 DIAGNOSIS — I2510 Atherosclerotic heart disease of native coronary artery without angina pectoris: Secondary | ICD-10-CM

## 2011-04-19 ENCOUNTER — Encounter: Payer: Self-pay | Admitting: Cardiology

## 2011-04-23 ENCOUNTER — Encounter (INDEPENDENT_AMBULATORY_CARE_PROVIDER_SITE_OTHER): Payer: Medicare Other | Admitting: Psychiatry

## 2011-04-23 DIAGNOSIS — F411 Generalized anxiety disorder: Secondary | ICD-10-CM

## 2011-04-23 DIAGNOSIS — F329 Major depressive disorder, single episode, unspecified: Secondary | ICD-10-CM

## 2011-05-21 ENCOUNTER — Encounter (HOSPITAL_COMMUNITY): Payer: Medicare Other | Admitting: Psychiatry

## 2011-05-21 ENCOUNTER — Ambulatory Visit (INDEPENDENT_AMBULATORY_CARE_PROVIDER_SITE_OTHER): Payer: Medicare Other | Admitting: Psychiatry

## 2011-05-21 ENCOUNTER — Encounter (HOSPITAL_COMMUNITY): Payer: Self-pay | Admitting: Psychiatry

## 2011-05-21 DIAGNOSIS — F329 Major depressive disorder, single episode, unspecified: Secondary | ICD-10-CM

## 2011-05-21 DIAGNOSIS — F419 Anxiety disorder, unspecified: Secondary | ICD-10-CM

## 2011-05-21 DIAGNOSIS — F411 Generalized anxiety disorder: Secondary | ICD-10-CM

## 2011-05-21 NOTE — Progress Notes (Signed)
Patient:  Sherry Cox   DOB: 10/20/1960  MR Number: [id number]  Location: [place]:  [address] [place],  [place], [address]  Start: Monday 05/21/2011 10:30 AM End: Monday 05/21/2011 11:30 AM  Provider/Observer:     Maurice Small, MSW, LCSW   Chief Complaint:      Chief Complaint  Patient presents with  . Anxiety  . Depression    Reason For Service:     The patient seeks services due to experiencing significant symptoms of anxiety and depression. She has a severe AS physical health problems due to kidney disease and has been a dialysis patient since April 2006. Symptoms include excessive worrying, panic attacks, depressed mood, and increased emotionality.  Interventions Strategy:  Supportive therapy, cognitive behavioral therapy  Participation Level:   Active  Participation Quality:  Appropriate      Behavioral Observation:  Fairly Groomed, Alert, and Appropriate.   Current Psychosocial Factors: The patient reports stress associated with making a decision about having both her kidneys removed, organizing and cleaning her bedroom, and preparing for the holidays.  Content of Session:   Reviewing symptoms, identifying stressors, discussing the panic cycle and ways to intervene, identifying and challenging thinking errors  Current Status:   Patient is experiencing increased panic attacks, increased worry, and sleep difficulty.  Patient Progress:   Fair  Target Goals:   1. Decrease anxiety as evidenced by decreased intensity and frequency of panic attacks and decreased worry. 2. Improved ability to identify and challenge thinking errors  as evidenced by increased self-acceptance and setting realistic expectations of self regarding  her physical functioning.  Last Reviewed:   05/21/2011  Goals Addressed Today:    Decreasing anxiety, improving ability to identify and challenge thinking errors  Impression/Diagnosis:   The patient has a long-standing history of  symptoms of anxiety and depression that appear to have become more intense when she became a dialysis patient in 2006. Symptoms include panic attacks, anxiety, and excessive worry and along with depressed mood. Patient also has a number of other health issues that contributedto her anxiety and mood including thyroid disease. Diagnosis: Anxiety disorder NOS, depressive disorder NOS  Diagnosis:  Axis I:  1. Anxiety disorder   2. Depressive disorder             Axis II: No diagnosis

## 2011-05-21 NOTE — Patient Instructions (Signed)
Practice relaxation breathing, identify and challenge thinking errors, set realistic expectations regarding completing household tasks

## 2011-06-01 ENCOUNTER — Other Ambulatory Visit: Payer: Self-pay | Admitting: Cardiology

## 2011-06-12 ENCOUNTER — Ambulatory Visit (INDEPENDENT_AMBULATORY_CARE_PROVIDER_SITE_OTHER): Payer: Medicare Other | Admitting: Psychiatry

## 2011-06-12 ENCOUNTER — Encounter (HOSPITAL_COMMUNITY): Payer: Medicare Other | Admitting: Psychiatry

## 2011-06-12 ENCOUNTER — Encounter (HOSPITAL_COMMUNITY): Payer: Self-pay | Admitting: Psychiatry

## 2011-06-12 VITALS — Wt 194.0 lb

## 2011-06-12 DIAGNOSIS — F329 Major depressive disorder, single episode, unspecified: Secondary | ICD-10-CM

## 2011-06-12 MED ORDER — SERTRALINE HCL 50 MG PO TABS: ORAL_TABLET | ORAL | Status: DC

## 2011-06-12 MED ORDER — SERTRALINE HCL 50 MG PO TABS: 75.0000 mg | ORAL_TABLET | Freq: Every day | ORAL | Status: DC

## 2011-06-12 MED ORDER — CLONAZEPAM 0.5 MG PO TABS: 0.5000 mg | ORAL_TABLET | Freq: Every day | ORAL | Status: DC | PRN

## 2011-06-12 NOTE — Progress Notes (Signed)
Patient came for her followup appointment. She is complaining of more tired anxiety and restlessness in the night. She endorse recently having panic attack and nervousness. Though she did not provide any reasons for that but feels maybe the holidays are making her more anxious and nervous. She denies any crying spells but reported decreased energy and decreased concentration and social isolation. She is using her clonazepam every day. She reported poor sleep due to anxiety. She denies any crying spells agitation anger or mood swings. She reported no side effects of medication. She is going to her dialysis regularly.  Mental status examination Patient is casually dressed appears tired and anxious. Her speech is soft and slow but clear and coherent. She denies any auditory or visual hallucination. She denies any active or passive suicidal thinking and homicidal thinking. Her attention and concentration is fair. She described her mood as depressed and her affect is constricted. She's alert and oriented x3. Her thought process is logical linear and goal-directed. Her insight and judgment is fair. There is no tremors or shakes present.  Assessment Depressive disorder NOS  Plan I talked to increase her Zoloft to 75 mg. She is agreed to try increase Zoloft. I will continue her clonazepam 0.5 mg daily for anxiety. She will continue to seek therapy in this office. I explained risks and benefits of medication in detail. I recommended to call if she has any question or concern about the medication or if she feels worsening of her symptoms. I will see her again in 6-8

## 2011-06-13 ENCOUNTER — Other Ambulatory Visit (HOSPITAL_COMMUNITY): Payer: Self-pay | Admitting: Psychiatry

## 2011-06-18 ENCOUNTER — Ambulatory Visit (INDEPENDENT_AMBULATORY_CARE_PROVIDER_SITE_OTHER): Payer: Medicare Other | Admitting: Psychiatry

## 2011-06-18 ENCOUNTER — Encounter (HOSPITAL_COMMUNITY): Payer: Self-pay | Admitting: Psychiatry

## 2011-06-18 DIAGNOSIS — F32A Depression, unspecified: Secondary | ICD-10-CM

## 2011-06-18 DIAGNOSIS — F329 Major depressive disorder, single episode, unspecified: Secondary | ICD-10-CM

## 2011-06-18 DIAGNOSIS — F419 Anxiety disorder, unspecified: Secondary | ICD-10-CM

## 2011-06-18 DIAGNOSIS — F411 Generalized anxiety disorder: Secondary | ICD-10-CM

## 2011-06-18 NOTE — Patient Instructions (Signed)
Continue to use relaxation breathing techniques

## 2011-06-18 NOTE — Progress Notes (Signed)
Patient:  Sherry Cox   DOB: 08-03-1960  MR Number: [id number]  Location: [place]:  [address] [place],  [place], [address]  Start: Monday 06/18/2011 9:30 AM End: Monday 06/18/2011 10:30 AM  Provider/Observer:     Maurice Small, MSW, LCSW   Chief Complaint:      Chief Complaint  Patient presents with  . Depression  . Anxiety    Reason For Service:     The patient seeks services due to experiencing significant symptoms of anxiety and depression. She has a severe AS physical health problems due to kidney disease and has been a dialysis patient since April 2006. Symptoms include excessive worrying, panic attacks, depressed mood, and increased emotionality.  Interventions Strategy:  Supportive therapy, cognitive behavioral therapy  Participation Level:   Active  Participation Quality:  Appropriate      Behavioral Observation:  Fairly Groomed, Alert, and Appropriate.   Current Psychosocial Factors: The patient reports concern about one of her nephews who is experiencing health issues  Content of Session:   Reviewing symptoms, processing feelings, reviewing relaxation techniques, identifying and challenging thinking errors  Current Status:   Patient is experiencing decreased panic attacks ,decreased worry, and improved sleep pattern.  Patient Progress:   Good. The patient reports experiencing decreased anxiety since taking an increased dosage of Zoloft as prescribed by Dr. Adele Schilder. She reports her mind seems to be more clear. She remains excited about the holidays but does not report nervousness as she has in the past. Patient has had increased involvement in activity including attending church and socializing with family and friends. Patient also appears to be more optimistic and is pleased that she has not been sick for the last 2-3 months. She has decided to call [facility] the beginning of the year to schedule surgery to have her kidney removed.  Target  Goals:   1. Decrease anxiety as evidenced by decreased intensity and frequency of panic attacks and decreased worry. 2. Improved ability to identify and challenge thinking errors  as evidenced by increased self-acceptance and setting realistic expectations of self regarding  her physical functioning.  Last Reviewed:   05/21/2011  Goals Addressed Today:    Decreasing anxiety, improving ability to identify and challenge thinking errors  Impression/Diagnosis:   The patient has a long-standing history of symptoms of anxiety and depression that appear to have become more intense when she became a dialysis patient in 2006. Symptoms include panic attacks, anxiety, and excessive worry and along with depressed mood. Patient also has a number of other health issues that contributedto her anxiety and mood including thyroid disease. Diagnosis: Anxiety disorder NOS, depressive disorder NOS  Diagnosis:  Axis I:  1. Depressive disorder   2. Anxiety disorder             Axis II: No diagnosis

## 2011-07-23 ENCOUNTER — Ambulatory Visit (HOSPITAL_COMMUNITY): Payer: Medicare Other | Admitting: Psychiatry

## 2011-07-27 ENCOUNTER — Ambulatory Visit (HOSPITAL_COMMUNITY): Payer: Medicare Other | Admitting: Psychiatry

## 2011-08-03 ENCOUNTER — Ambulatory Visit (INDEPENDENT_AMBULATORY_CARE_PROVIDER_SITE_OTHER): Payer: Medicare Other | Admitting: Psychiatry

## 2011-08-03 DIAGNOSIS — F329 Major depressive disorder, single episode, unspecified: Secondary | ICD-10-CM

## 2011-08-03 DIAGNOSIS — F419 Anxiety disorder, unspecified: Secondary | ICD-10-CM

## 2011-08-03 DIAGNOSIS — F411 Generalized anxiety disorder: Secondary | ICD-10-CM

## 2011-08-03 NOTE — Patient Instructions (Signed)
Discussed orally 

## 2011-08-03 NOTE — Progress Notes (Addendum)
Patient:  Sherry Cox   DOB: 01/15/1961  MR Number: [id number]  Location: [place]:  [address] [place],  [place], [address]  Start: Friday 08/03/2011 10:30 AM End: Friday 08/03/2011 11:20 AM  Provider/Observer:     Maurice Small, MSW, LCSW   Chief Complaint:      Chief Complaint  Patient presents with  . Depression  . Anxiety    Reason For Service:     The patient seeks services due to experiencing significant symptoms of anxiety and depression. She has severe physical health problems due to kidney disease and has been a dialysis patient since April 2006. Symptoms include excessive worrying, panic attacks, depressed mood, and increased emotionality.  Interventions Strategy:  Supportive therapy, cognitive behavioral therapy  Participation Level:   Active  Participation Quality:  Appropriate      Behavioral Observation:  Fairly Groomed, Alert, and Appropriate.   Current Psychosocial Factors: The patient reports continued concern about one of her nephews who is experiencing health issues.    Content of Session:   Reviewing symptoms, processing feelings, reviewing relaxation techniques, identifying and challenging thinking errors  Current Status:   Patient is experiencing decreased panic attacks ,decreased worry, and improved sleep pattern. Several of patient's co- dialysis patients having died in recent weeks. She also has concerns about 2 friends who have a terminal illness  Patient Progress:   Good. The patient reports continued decreased anxiety and improved mood since taking an increased dosage of Zoloft as prescribed by Dr. Adele Schilder. Patient has had continued involvement in activity including attending church and socializing with family and friends. She also has been more active around her home completing household tasks. She is scheduled to be seen at [facility] on February 21 to discuss surgery to remove her kidney. Patient expresses optimism about surgery.  Patient is excited that she is in the process of quitting smoking by using a vapor cigarette.  Target Goals:   1. Decrease anxiety as evidenced by decreased intensity and frequency of panic attacks and decreased worry. 2. Improved ability to identify and challenge thinking errors  as evidenced by increased self-acceptance and setting realistic expectations of self regarding  her physical functioning.  Last Reviewed:   05/21/2011  Goals Addressed Today:    Decreasing anxiety, improving ability to identify and challenge thinking errors  Impression/Diagnosis:   The patient has a long-standing history of symptoms of anxiety and depression that appear to have become more intense when she became a dialysis patient in 2006. Symptoms include panic attacks, anxiety, and excessive worry and along with depressed mood. Patient also has a number of other health issues that contribute to her anxiety and mood including thyroid disease. Diagnosis: Anxiety disorder NOS, depressive disorder NOS  Diagnosis:  Axis I:  1. Depressive disorder   2. Anxiety             Axis II: No diagnosis

## 2011-08-14 ENCOUNTER — Ambulatory Visit (HOSPITAL_COMMUNITY): Payer: Medicare Other | Admitting: Psychiatry

## 2011-08-28 ENCOUNTER — Ambulatory Visit (HOSPITAL_COMMUNITY): Payer: Medicare Other | Admitting: Psychiatry

## 2011-08-29 ENCOUNTER — Encounter: Payer: Self-pay | Admitting: Vascular Surgery

## 2011-08-30 ENCOUNTER — Other Ambulatory Visit (INDEPENDENT_AMBULATORY_CARE_PROVIDER_SITE_OTHER): Payer: Medicare Other | Admitting: *Deleted

## 2011-08-30 ENCOUNTER — Ambulatory Visit (INDEPENDENT_AMBULATORY_CARE_PROVIDER_SITE_OTHER): Payer: Medicare Other | Admitting: Vascular Surgery

## 2011-08-30 ENCOUNTER — Encounter: Payer: Self-pay | Admitting: Vascular Surgery

## 2011-08-30 VITALS — BP 127/37 | HR 76 | Resp 16 | Ht <= 58 in | Wt 197.0 lb

## 2011-08-30 DIAGNOSIS — I6529 Occlusion and stenosis of unspecified carotid artery: Secondary | ICD-10-CM

## 2011-08-30 NOTE — Progress Notes (Signed)
[facility] [place] HISTORY AND PHYSICAL   History of Present Illness:  Patient is a 51 y.o. year old female who presents for follow-up evaluation for carotid stenosis.  She is on Aspirin for antiplatelet therapy.  Her atherosclerotic risk factors remain diabetes, elevated cholesterol, hypertension, smoking (recently switched to E-cig to try to quit) , and coronary artery disease.  These are all currently stable and followed by her primary care physician.  She denies any new neurologic events including amaurosis, numbness, or weakness.  Past Medical History  Diagnosis Date  . Overweight   . Anxiety disorder   . Carotid artery stenosis     12/10/2008 doppler 0000000 RICA, [id number] LICA/see evaluation by Dr. Oneida Alar 2011  . CAD (coronary artery disease)     Total RCA, stress echo [facility] 11/2008-no ischemia, EF 55%; h/o CABG  . Hyperlipidemia   . ESRD on dialysis     Transplant consideration  . Tobacco abuse   . Hx of CABG     Off pump LIMA to LAD  05/2006  . Ejection fraction     EF 60-65%, echo, June, 2009  . Murmur     October, 2012  . Neuropathy     Past Surgical History  Procedure Date  . Cholecystectomy   . Coronary artery bypass graft 05/2006    Off pump LIMA to LAD  . Inner ear surgery   . Plastic surgery on leg     Review of Systems:  Neurologic: as above Cardiac:denies shortness of breath or chest pain Pulmonary: denies cough or wheeze  Social History History  Substance Use Topics  . Smoking status: Current Some Day Smoker -- 0.3 packs/day for 30 years    Types: Cigarettes  . Smokeless tobacco: Never Used   Comment: 4 ciggs per day  . Alcohol Use: No    Allergies  Not on File   Current Outpatient Prescriptions  Medication Sig Dispense Refill  . albuterol (PROAIR HFA) 108 (90 BASE) MCG/ACT inhaler Inhale 2 puffs into the lungs as needed.        Marland Kitchen aspirin 325 MG tablet Take 325 mg by mouth daily.        . clonazePAM (KLONOPIN) 0.5 MG tablet  Take 1 tablet (0.5 mg total) by mouth daily as needed for anxiety. Take 1 tablet by mouth daily.  30 tablet  1  . folic acid-vitamin b complex-vitamin c-selenium-zinc (DIALYVITE) 3 MG TABS Take 1 tablet by mouth daily.        Marland Kitchen HYDROcodone-acetaminophen (VICODIN) 5-500 MG per tablet Take one tablet twice a day       . levothyroxine (SYNTHROID, LEVOTHROID) 150 MCG tablet Take 1 tablet by mouth Daily.      . metoprolol (LOPRESSOR) 50 MG tablet Take 1 tablet (50 mg total) by mouth 2 (two) times daily.  180 tablet  3  . NITROSTAT 0.4 MG SL tablet DISSOLVE 1 TABLET UNDER    TONGUE AT ONSET OF CHEST   PAIN EVERY 5 MINUTES FOR 3 DOSES-IF NO RELIEF CALL 911  75 each  3  . sertraline (ZOLOFT) 50 MG tablet Take 1.5 tab daily   45 tablet  1  . sevelamer (RENVELA) 800 MG tablet Take 5 tablets by mouth with meals and 3 tablets by mouth with snacks.       . simvastatin (ZOCOR) 40 MG tablet Take 1 tablet (40 mg total) by mouth at bedtime. PATIENT NEED CHOLESTEROL LABS  30 tablet  12  .  sodium bicarbonate 650 MG tablet Take 1 tablet by mouth two times a day.       . zolpidem (AMBIEN) 5 MG tablet Take 5 mg by mouth at bedtime as needed.          Physical Examination  Filed Vitals:   08/30/11 1547  BP: 127/37  Pulse: 76  Resp: 16  Height: 4\' 10"  (1.473 m)  Weight: 197 lb (89.359 kg)  SpO2: 99%    Body mass index is 41.17 kg/(m^2).  General:  Alert and oriented, no acute distress HEENT: Normal Neck: No bruit or JVD Pulmonary: Clear to auscultation bilaterally Cardiac: Regular Rate and Rhythm without murmur Neurologic: Upper and lower extremity motor 5/5 and symmetric  DATA: She had a carotid duplex exam performed today which are reviewed and interpreted. This showed bilateral 60-80% stenosis with some progression of left-sided stenosis but still within the moderate range   ASSESSMENT: I discussed with the patient today the possibility of carotid angiograms she had had progression of the stenosis on  the left side. However since she is asymptomatic and still a moderate range she has opted for continued medical management for now. I am okay with this as long as she remains asymptomatic. She will have a followup carotid duplex exam in 6 months. If she continues to progress we'll revisit the thought of carotid angiogram. Additionally she is considering having bilateral nephrectomy. I informed her that if she wishes to undergo bilateral nephrectomy should he should definitely evaluate her carotids further prior to the operation.   PLAN: Followup carotid duplex exam and office visit in 6 months continue aspirin  Ruta Hinds, MD [facility] [place] Office: [phone] Pager: [phone]

## 2011-09-04 ENCOUNTER — Encounter (HOSPITAL_COMMUNITY): Payer: Self-pay | Admitting: Psychiatry

## 2011-09-04 ENCOUNTER — Ambulatory Visit (INDEPENDENT_AMBULATORY_CARE_PROVIDER_SITE_OTHER): Payer: Medicare Other | Admitting: Psychiatry

## 2011-09-04 DIAGNOSIS — F329 Major depressive disorder, single episode, unspecified: Secondary | ICD-10-CM

## 2011-09-04 DIAGNOSIS — F411 Generalized anxiety disorder: Secondary | ICD-10-CM

## 2011-09-04 DIAGNOSIS — F419 Anxiety disorder, unspecified: Secondary | ICD-10-CM

## 2011-09-04 MED ORDER — CLONAZEPAM 0.5 MG PO TABS: 0.5000 mg | ORAL_TABLET | Freq: Every day | ORAL | Status: DC | PRN

## 2011-09-04 NOTE — Progress Notes (Signed)
Chief complaint I'm doing better and I stop smoking  History of presenting illness Patient is 51 year old female who came for her followup appointment. She has been compliant with her medication and reported no side effects. She is sleeping fine and recently no panic attacks. She denies any agitation anger or mood swings. She is very excited as she quit smoking in January. She reported her behavior is much control and she denies any depressive thoughts. She's been compliant with her dialysis regularly. She still take Ambien as needed prescribed by primary care physician.  Current psychiatric medication Klonopin 0.5 mg as needed for anxiety Zoloft 50 mg one half tablet every day Ambien 5 mg as needed prescribed by primary care physician  Mental status examination Patient is casually dressed and fairly groomed. She is pleasant calm and cooperative. She maintained good eye contact. She denies any auditory or visual hallucination. She denies any active or passive suicidal thinking and homicidal thinking. She described her mood is good. Her speech is soft clear and coherent. She's alert and oriented x3. Her insight judgment and impulse control is okay.  Assessment Axis I depressive disorder NOS Axis II deferred Axis III see medical history Axis IV mild to moderate  Plan I will continue her current psychiatric medication. I explained risks and benefits of medication. I will see her again in 2 months

## 2011-09-07 NOTE — Procedures (Unsigned)
CAROTID DUPLEX EXAM  INDICATION:  Follow up carotid artery stenosis.  HISTORY: Diabetes:  No Cardiac:  Coronary artery disease, CABG Hypertension:  Yes Smoking:  Quit 1 month ago Previous Surgery:  Left upper extremity AVF placed 2004. CV History:  Asymptomatic. Amaurosis [place]  No, Paresthesias No, Hemiparesis, No                                     RIGHT             LEFT Brachial systolic pressure:         128                 AVF Brachial Doppler waveforms:         Triphasic Vertebral direction of flow:        Antegrade         Antegrade DUPLEX VELOCITIES (cm/sec) CCA peak systolic                   254               337 (distal) ECA peak systolic                   334               [id number] ICA peak systolic                   257               [id number] ICA end diastolic                   76                54 PLAQUE MORPHOLOGY:                  Mixed, irregular  Mixed, irregular PLAQUE AMOUNT:                      Moderate to severe                  Severe PLAQUE LOCATION:                    CCA, ICA, ECA     CCA, bifurcation, ICA, ECA  IMPRESSION: 1. Bilateral 60% to 79% ICA stenosis, upper end of range. 2. Bilateral ECA stenosis. 3. Increased velocities bilaterally since prior study of 02/23/2011.  ___________________________________________ Jessy Oto. Fields, MD  SS/MEDQ  D:  08/30/2011  T:  08/30/2011  Job:  [id number]

## 2011-09-14 ENCOUNTER — Ambulatory Visit (HOSPITAL_COMMUNITY): Payer: Medicare Other | Admitting: Psychiatry

## 2011-09-17 ENCOUNTER — Other Ambulatory Visit: Payer: Self-pay | Admitting: Cardiology

## 2011-09-19 ENCOUNTER — Ambulatory Visit: Payer: Medicare Other | Admitting: Cardiology

## 2011-09-26 ENCOUNTER — Ambulatory Visit (INDEPENDENT_AMBULATORY_CARE_PROVIDER_SITE_OTHER): Payer: Medicare Other | Admitting: Cardiovascular Disease

## 2011-09-26 ENCOUNTER — Encounter: Payer: Self-pay | Admitting: Cardiovascular Disease

## 2011-09-26 VITALS — BP 124/63 | HR 69 | Ht <= 58 in | Wt 196.0 lb

## 2011-09-26 DIAGNOSIS — I2510 Atherosclerotic heart disease of native coronary artery without angina pectoris: Secondary | ICD-10-CM

## 2011-09-26 DIAGNOSIS — Z720 Tobacco use: Secondary | ICD-10-CM

## 2011-09-26 DIAGNOSIS — E785 Hyperlipidemia, unspecified: Secondary | ICD-10-CM

## 2011-09-26 DIAGNOSIS — I6529 Occlusion and stenosis of unspecified carotid artery: Secondary | ICD-10-CM

## 2011-09-26 DIAGNOSIS — F17200 Nicotine dependence, unspecified, uncomplicated: Secondary | ICD-10-CM

## 2011-09-26 DIAGNOSIS — R011 Cardiac murmur, unspecified: Secondary | ICD-10-CM

## 2011-09-26 NOTE — Patient Instructions (Signed)
Your physician wants you to follow-up in: 6 months. You will receive a reminder letter in the mail one-two months in advance. If you don't receive a letter, please call our office to schedule the follow-up appointment. Your physician recommends that you continue on your current medications as directed. Please refer to the Current Medication list given to you today. 

## 2011-09-26 NOTE — Assessment & Plan Note (Signed)
The patient has made significant progress with smoking cessation. She is using her to chronic cigarette and she is currently down to smoking one or 2 cigarettes a day.

## 2011-09-26 NOTE — Assessment & Plan Note (Signed)
The patient seems to be stable from a cardiac standpoint. She has no convincing symptoms of angina. Continue medical therapy. Her last stress test was in 2010 which showed no evidence of ischemia. If she goes back on the kidney transplant list, consider repeating her stress test at that time.

## 2011-09-26 NOTE — Progress Notes (Signed)
HPI  This is a 51 year old female who is here today for a followup visit. She has known history of coronary artery disease status post coronary artery bypass graft surgery. She also has known history of asymptomatic bilateral carotid stenosis which is being followed by vascular surgery. She has end-stage renal disease on hemodialysis. She was on the kidney transplant list at some point but she is currently not actively on the list due to consideration for doing bilateral nephrectomy before the transplant. Overall, she has been doing reasonably well. She gets occasional brief chest pain. She denies significant dyspnea.  No Known Allergies   Current Outpatient Prescriptions on File Prior to Visit  Medication Sig Dispense Refill  . albuterol (PROAIR HFA) 108 (90 BASE) MCG/ACT inhaler Inhale 2 puffs into the lungs as needed.        Marland Kitchen aspirin 325 MG tablet Take 325 mg by mouth daily.        . folic acid-vitamin b complex-vitamin c-selenium-zinc (DIALYVITE) 3 MG TABS Take 1 tablet by mouth daily.        Marland Kitchen HYDROcodone-acetaminophen (VICODIN) 5-500 MG per tablet Take one tablet twice a day       . levothyroxine (SYNTHROID, LEVOTHROID) 150 MCG tablet Take 1 tablet by mouth Daily.      . metoprolol (LOPRESSOR) 50 MG tablet Take 1 tablet (50 mg total) by mouth 2 (two) times daily.  180 tablet  3  . NITROSTAT 0.4 MG SL tablet DISSOLVE 1 TABLET UNDER TONGUE EVERY 5 MINUTES UP TO 3 DOSES AS NEEDED FOR CHEST PAIN  30 each  3  . sevelamer (RENVELA) 800 MG tablet Take 5 tablets by mouth with meals and 3 tablets by mouth with snacks.       . simvastatin (ZOCOR) 40 MG tablet Take 1 tablet (40 mg total) by mouth at bedtime. PATIENT NEED CHOLESTEROL LABS  30 tablet  12  . sodium bicarbonate 650 MG tablet Take 1 tablet by mouth two times a day.       . zolpidem (AMBIEN) 5 MG tablet Take 5 mg by mouth at bedtime as needed.           Past Medical History  Diagnosis Date  . Overweight   . Anxiety disorder   .  Carotid artery stenosis     12/10/2008 doppler [id number] RICA, [id number] LICA/see evaluation by Dr. Oneida Alar 2011  . CAD (coronary artery disease)     Total RCA, stress echo [facility] 11/2008-no ischemia, EF 55%; h/o CABG  . Hyperlipidemia   . ESRD on dialysis     Transplant consideration  . Tobacco abuse   . Hx of CABG     Off pump LIMA to LAD  05/2006  . Ejection fraction     EF 60-65%, echo, June, 2009  . Murmur     October, 2012  . Neuropathy      Past Surgical History  Procedure Date  . Cholecystectomy   . Coronary artery bypass graft 05/2006    Off pump LIMA to LAD  . Inner ear surgery   . Plastic surgery on leg      Family History  Problem Relation Age of Onset  . Depression Sister   . Bipolar disorder Brother   . Bipolar disorder Other   . Bipolar disorder Other      History   Social History  . Marital Status: Married    Spouse Name: N/A    Number of Children: N/A  .  Years of Education: N/A   Occupational History  . DISABLED    Social History Main Topics  . Smoking status: Current Some Day Smoker -- 0.3 packs/day for 30 years    Types: Cigarettes  . Smokeless tobacco: Never Used   Comment: 4 ciggs per day  . Alcohol Use: No  . Drug Use: No  . Sexually Active: No   Other Topics Concern  . Not on file   Social History Narrative  . No narrative on file     PHYSICAL EXAM   BP 124/63  Pulse 69  Ht 4\' 10"  (1.473 m)  Wt 196 lb (88.905 kg)  BMI 40.96 kg/m2  Constitutional: She is oriented to person, place, and time. She appears well-developed and well-nourished. No distress.  HENT: No nasal discharge.  Head: Normocephalic and atraumatic.  Eyes: Pupils are equal and round. Right eye exhibits no discharge. Left eye exhibits no discharge.  Neck: Normal range of motion. Neck supple. No JVD present. No thyromegaly present. She has bilateral carotid bruits. Cardiovascular: Normal rate, regular rhythm, normal heart sounds. Exam reveals no gallop and no friction  rub. There is a 2/6 systolic ejection murmur at the base with preserved S2.  Pulmonary/Chest: Effort normal and breath sounds normal. No stridor. No respiratory distress. She has no wheezes. She has no rales. She exhibits no tenderness.  Abdominal: Soft. Bowel sounds are normal. She exhibits no distension. There is no tenderness. There is no rebound and no guarding.  Musculoskeletal: Normal range of motion. She exhibits no edema and no tenderness.  Neurological: She is alert and oriented to person, place, and time. Coordination normal.  Skin: Skin is warm and dry. No rash noted. She is not diaphoretic. No erythema. No pallor.  Psychiatric: She has a normal mood and affect. Her behavior is normal. Judgment and thought content normal.     EKG: Normal sinus rhythm with nonspecific T wave changes.   ASSESSMENT AND PLAN

## 2011-09-26 NOTE — Assessment & Plan Note (Signed)
This has progressed slightly and is being followed by Dr. Oneida Alar. She is asymptomatic.

## 2011-09-26 NOTE — Assessment & Plan Note (Signed)
Continue treatment with simvastatin. This is being followed by Dr. Wenda Overland. She had recent labs done. I recommend a target LDL of less than 70.

## 2011-09-26 NOTE — Assessment & Plan Note (Signed)
This is likely a functional flow murmur due to high flow state. Her echocardiogram showed normal LV systolic function without significant valvular abnormalities.

## 2011-09-28 ENCOUNTER — Ambulatory Visit (INDEPENDENT_AMBULATORY_CARE_PROVIDER_SITE_OTHER): Payer: Medicare Other | Admitting: Psychiatry

## 2011-09-28 ENCOUNTER — Encounter (HOSPITAL_COMMUNITY): Payer: Self-pay | Admitting: Psychiatry

## 2011-09-28 ENCOUNTER — Other Ambulatory Visit: Payer: Self-pay | Admitting: Cardiology

## 2011-09-28 DIAGNOSIS — F329 Major depressive disorder, single episode, unspecified: Secondary | ICD-10-CM

## 2011-09-28 NOTE — Patient Instructions (Signed)
Discussed orally - continue to use relaxation breathing

## 2011-09-28 NOTE — Progress Notes (Signed)
Patient:  Sherry Cox   DOB: 11-14-1960  MR Number: [id number]  Location: [place]:  [address] [place],  [place], [address]  Start: Friday 3/22//2013 10:00 AM End: Friday 3/22//2013 10:45 AM  Provider/Observer:     Maurice Small, MSW, LCSW   Chief Complaint:      Chief Complaint  Patient presents with  . Depression  . Anxiety    Reason For Service:     The patient seeks services due to history of  symptoms of anxiety and depression. She has severe physical health problems due to kidney disease and has been a dialysis patient since April 2006. Symptoms include excessive worrying, panic attacks, depressed mood, and increased emotionality. Patient is seen today for a follow up appointment.  Interventions Strategy:  Supportive therapy, cognitive behavioral therapy  Participation Level:   Active  Participation Quality:  Appropriate      Behavioral Observation:  Fairly Groomed, Alert, and Appropriate.   Current Psychosocial Factors: The patient reports her nephew is recovering from having mercer.  Patient reports one of her co-dialysis patients recently died.  Content of Session:   Reviewing symptoms, processing feelings, reviewing relaxation and coping techniques, identifying areas within patient's control  Current Status:   Patient is continues to experience decreased panic attacks ,decreased worry, and improved sleep pattern.   Patient Progress:   Good. The patient reports recovering from cold symptoms but continuing to feel tired.  She has been less involved in activity due to being sick.  She reports initially experiencing increased stress and anxiety when her nephew contracted Mercer when he was in the hospital to have his toe amputated. However she reports being able to respond in a healthy way by challenging her thought patterns and using distracting activities. Patient reports she has had only 2 panic attacks since last session. She reports another one of  her co-dialysis patients died recently. Patient expresses appropriate concern and sadness but is not overwhelmed by the death. Patient reports no depressive symptoms. She expresses concern about her sister who continues to have a health issues as well as financial concerns. Therapist works with patient to identify areas within her control regarding being supportive of sister.    Target Goals:   1. Decrease anxiety as evidenced by decreased intensity and frequency of panic attacks and decreased worry. 2. Improved ability to identify and challenge thinking errors  as evidenced by increased self-acceptance and setting realistic expectations of self regarding  her physical functioning.  Last Reviewed:   05/21/2011  Goals Addressed Today:    Decreasing anxiety, improving ability to identify and challenge thinking errors  Impression/Diagnosis:   The patient has a long-standing history of symptoms of anxiety and depression that appear to have become more intense when she became a dialysis patient in 2006. Symptoms include panic attacks, anxiety, and excessive worry and along with depressed mood. Patient also has a number of other health issues that contribute to her anxiety and mood including thyroid disease. Diagnosis: Anxiety disorder NOS, depressive disorder NOS  Diagnosis:  Axis I:  1. Depressive disorder, not elsewhere classified             Axis II: No diagnosis

## 2011-11-01 ENCOUNTER — Ambulatory Visit (INDEPENDENT_AMBULATORY_CARE_PROVIDER_SITE_OTHER): Payer: Medicare Other | Admitting: Psychiatry

## 2011-11-01 ENCOUNTER — Encounter (HOSPITAL_COMMUNITY): Payer: Self-pay | Admitting: Psychiatry

## 2011-11-01 DIAGNOSIS — F329 Major depressive disorder, single episode, unspecified: Secondary | ICD-10-CM

## 2011-11-01 MED ORDER — CLONAZEPAM 0.5 MG PO TABS: 0.5000 mg | ORAL_TABLET | Freq: Every day | ORAL | Status: DC | PRN

## 2011-11-01 NOTE — Progress Notes (Signed)
Chief complaint Medication management and followup.    History of presenting illness Patient is 51 year old female who came for her followup appointment. She has been compliant with her medication and reported no side effects. She is sleeping fine and recently no panic attacks. She still waiting for her renal transplant list.  She feels very anxious on her dialysis days but recently there were no panic attack.  She denies any agitation anger or mood swings. She stopped smoking in January however there were times when she had smoke one to 2 cigarettes and past few weeks.  She denies any recent depressive thoughts or any crying spells.  She denies any side effects of medication.  She rarely takes Ambien at night.  Current psychiatric medication Klonopin 0.5 mg as needed for anxiety Zoloft 50 mg one half tablet every day Ambien 5 mg as needed prescribed by primary care physician  Medical history Patient has chronic renal insufficiency.  She's on dialysis.  She is a history of hyperlipidemia, history of CABG, ejection fraction murmur, coronary artery stenosis and obesity.  Mental status examination Patient is casually dressed and fairly groomed. She is pleasant but tired .  She's coming from dialysis.  She maintained good eye contact.  Her speech is clear and fluent and coherent.  Her thought processes are logical linear and goal-directed.  She denies any auditory or visual hallucination. She denies any active or passive suicidal thinking and homicidal thinking. She described her mood is good.  Her attention and concentration is fair.  She is alert and oriented x3. Her insight judgment and impulse control is okay.  Assessment Axis I depressive disorder NOS Axis II deferred Axis III see medical history Axis IV mild to moderate  Plan I will continue her current psychiatric medication. I explained risks and benefits of medication.  She requires to prescription of Klonopin.  I will see her again in 2  months

## 2011-11-12 ENCOUNTER — Ambulatory Visit (INDEPENDENT_AMBULATORY_CARE_PROVIDER_SITE_OTHER): Payer: Medicare Other | Admitting: Psychiatry

## 2011-11-12 DIAGNOSIS — F419 Anxiety disorder, unspecified: Secondary | ICD-10-CM

## 2011-11-12 DIAGNOSIS — F411 Generalized anxiety disorder: Secondary | ICD-10-CM

## 2011-11-12 DIAGNOSIS — F329 Major depressive disorder, single episode, unspecified: Secondary | ICD-10-CM

## 2011-11-12 NOTE — Progress Notes (Signed)
Patient:  Sherry Cox   DOB: February 19, 1961  MR Number: [id number]  Location: [place]:  [address] [place],  [place], [address]  Start: Monday 11/12/2011 10:10 AM End: Monday 11/12/2011 10:45 AM  Provider/Observer:     Maurice Small, MSW, LCSW   Chief Complaint:      Chief Complaint  Patient presents with  . Depression  . Anxiety    Reason For Service:     The patient seeks services due to history of  symptoms of anxiety and depression. She has severe physical health problems due to kidney disease and has been a dialysis patient since April 2006. Symptoms include excessive worrying, panic attacks, depressed mood, and increased emotionality. Patient is seen today for a follow up appointment.  Interventions Strategy:  Supportive therapy, cognitive behavioral therapy  Participation Level:   Active  Participation Quality:  Appropriate      Behavioral Observation:  Fairly Groomed, Alert, and Appropriate.   Current Psychosocial Factors: The patient reports sister is scheduled to have a liver transplant in June, 2013  Content of Session:   Reviewing symptoms, processing feelings, reviewing relaxation and coping techniques,   Current Status:   Patient is continues to experience decreased panic attacks ,decreased worry, and improved sleep pattern.   Patient Progress:   Good. The patient reports having one panic attack since last session. She is more aware of her triggers and is able to intervene successfully. She is pleased with her use of coping and relaxation skills. Patient also has improved efforts and is able to successfully identify and challenge negative thinking.  She has maintained involvement in activities and has been socializing with family and friends. She has a couple of relatives who continue to experience health issues. Patient expresses appropriate concern.  Therapist and patient discuss possibility of termination at next session.      Target Goals:   1.  Decrease anxiety as evidenced by decreased intensity and frequency of panic attacks and decreased worry. 2. Improved ability to identify and challenge thinking errors  as evidenced by increased self-acceptance and setting realistic expectations of self regarding  her physical functioning.  Last Reviewed:   05/21/2011  Goals Addressed Today:    Decreasing anxiety, improving ability to identify and challenge thinking errors  Impression/Diagnosis:   The patient has a long-standing history of symptoms of anxiety and depression that appear to have become more intense when she became a dialysis patient in 2006. Symptoms include panic attacks, anxiety, and excessive worry and along with depressed mood. Patient also has a number of other health issues that contribute to her anxiety and mood including thyroid disease. Diagnosis: Anxiety disorder NOS, depressive disorder NOS  Diagnosis:  Axis I:  1. Depressive disorder, not elsewhere classified   2. Anxiety disorder             Axis II: No diagnosis

## 2011-11-12 NOTE — Patient Instructions (Signed)
Discussed orally 

## 2011-12-31 ENCOUNTER — Encounter (HOSPITAL_COMMUNITY): Payer: Self-pay | Admitting: Psychiatry

## 2011-12-31 ENCOUNTER — Ambulatory Visit (INDEPENDENT_AMBULATORY_CARE_PROVIDER_SITE_OTHER): Payer: Medicare Other | Admitting: Psychiatry

## 2011-12-31 DIAGNOSIS — F329 Major depressive disorder, single episode, unspecified: Secondary | ICD-10-CM

## 2011-12-31 NOTE — Patient Instructions (Signed)
Discussed orally 

## 2011-12-31 NOTE — Progress Notes (Signed)
Patient:  Sherry Cox   DOB: Aug 05, 1960  MR Number: [id number]  Location: [place]:  [address] [place],  [place], [address]  Start: Monday 12/31/2011 10:00 AM End: Monday 12/31/2011 10:45 AM  Provider/Observer:     Maurice Small, MSW, LCSW   Chief Complaint:      Chief Complaint  Patient presents with  . Depression  . Anxiety    Reason For Service:     The patient seeks services due to history of  symptoms of anxiety and depression. She has severe physical health problems due to kidney disease and has been a dialysis patient since April 2006. Symptoms include excessive worrying, panic attacks, depressed mood, and increased emotionality. Patient is seen today for a follow up appointment.  Interventions Strategy:  Supportive therapy, cognitive behavioral therapy  Participation Level:   Active  Participation Quality:  Appropriate      Behavioral Observation:  Fairly Groomed, Alert, and Appropriate.   Current Psychosocial Factors: The patient reports concerns about multiple health issues of her family and acquaintances.  Content of Session:   Reviewing symptoms, processing feelings, identifying and challenging cognitive distortions, reviewing relaxation and coping techniques,   Current Status:   Patient reports increased panic attacks, ncreased worry, and poor sleep pattern  Patient Progress:   Fair. The patient reports increased anxiety and panic attacks along with excessive worry for the past 3 weeks. Patient reports having panic attacks daily along with having a severe panic attack yesterday lasting about 4 hours per patient's report. Patient also reports increased physical pain due to to sciatica. She also reports feeling bad to her decreased protein level. Therapist encourages patient to work with medical personnel regarding her physical and medical issues. Therapist works with patient to review the panic cycle and ways to intervene. Therapist also works with  patient to identify and challenge cognitive distortions as well as ways to set and maintain boundaries.     Target Goals:   1. Decrease anxiety as evidenced by decreased intensity and frequency of panic attacks and decreased worry. 2. Improved ability to identify and challenge thinking errors  as evidenced by increased self-acceptance and setting realistic expectations of self regarding  her physical functioning.  Last Reviewed:   05/21/2011  Goals Addressed Today:    Decreasing anxiety, improving ability to identify and challenge thinking errors  Impression/Diagnosis:   The patient has a long-standing history of symptoms of anxiety and depression that appear to have become more intense when she became a dialysis patient in 2006. Symptoms include panic attacks, anxiety, and excessive worry and along with depressed mood. Patient also has a number of other health issues that contribute to her anxiety and mood including thyroid disease. Diagnosis: Anxiety disorder NOS, depressive disorder NOS  Diagnosis:  Axis I:  1. Depressive disorder             Axis II: No diagnosis

## 2012-01-11 ENCOUNTER — Other Ambulatory Visit (HOSPITAL_COMMUNITY): Payer: Self-pay | Admitting: Psychiatry

## 2012-01-11 DIAGNOSIS — F329 Major depressive disorder, single episode, unspecified: Secondary | ICD-10-CM

## 2012-01-14 ENCOUNTER — Other Ambulatory Visit: Payer: Self-pay | Admitting: Cardiology

## 2012-01-14 NOTE — Telephone Encounter (Signed)
Refilled NTG 0.4mg 

## 2012-01-31 ENCOUNTER — Ambulatory Visit (INDEPENDENT_AMBULATORY_CARE_PROVIDER_SITE_OTHER): Payer: Medicare Other | Admitting: Psychiatry

## 2012-01-31 DIAGNOSIS — F329 Major depressive disorder, single episode, unspecified: Secondary | ICD-10-CM

## 2012-01-31 MED ORDER — CLONAZEPAM 0.5 MG PO TABS: 0.5000 mg | ORAL_TABLET | Freq: Every day | ORAL | Status: DC

## 2012-01-31 NOTE — Progress Notes (Signed)
Chief complaint Medication management and followup.    History of presenting illness Patient is 51 year old female who came for her followup appointment. She has been compliant with her medication and reported no side effects. She is sleeping fine and recently no panic attacks. However she takes night.  Klonopin every night.  She feel very anxious on the days of her dialysis.  Recently she was told that her transplant process is on hold due to recent blood clot in her neck arteries.  She is scheduled to have more test .  Overall she is doing very well on Zoloft and Klonopin.  She denies any agitation anger mood swing.  She daily takes Ambien at night.  She's not drinking or using any illegal substance.  Current psychiatric medication Klonopin 0.5 mg at bedtime  Zoloft 50 mg one half tablet every day Ambien 5 mg as needed prescribed by primary care physician  Past psychiatric history Patient has been seeing in this office since February 2007.  She had a renal failure in April 2006.  Patient denies any history of suicidal attempt or any inpatient psychiatric treatment.  Patient denies any history of mania or psychosis.  She is taking Zoloft and Klonopin since then and stable on her current medication.  Medical history Patient has chronic renal insufficiency.  She's on dialysis.  She is a history of hyperlipidemia, history of CABG, ejection fraction murmur, coronary artery stenosis and obesity.  Family history Patient has a family history of psychiatric illness.    Psychosocial history Patient was born and raised in [place].  She has been married twice.  She has no children.  She lives with her sister and her son.    Alcohol and substance use history Patient denies any history of alcohol and substance use.  Mental status examination Patient is casually dressed and fairly groomed. She is pleasant but tired .   she had dialysis today.  She maintained good eye contact.  Her speech is clear  and fluent and coherent.  Her thought processes are logical linear and goal-directed.  She denies any auditory or visual hallucination. She denies any active or passive suicidal thinking and homicidal thinking. She described her mood is good.  Her attention and concentration is fair.  She is alert and oriented x3. Her insight judgment and impulse control is okay.  Assessment Axis I depressive disorder NOS Axis II deferred Axis III see medical history Axis IV mild to moderate  Plan I will continue her current psychiatric medication.  she still has Zoloft however she will call us if she requires refill.  She need a new prescription of Klonopin.  I explained the risk and benefits of medication.  I recommend to call us if she is any question or concern or if she feels worsening of the symptoms.  I will see her again in 2 months.  Portion of this note is generated with voice dictation soft and may contain typographical error.

## 2012-02-01 ENCOUNTER — Telehealth (HOSPITAL_COMMUNITY): Payer: Self-pay | Admitting: *Deleted

## 2012-02-11 ENCOUNTER — Ambulatory Visit (INDEPENDENT_AMBULATORY_CARE_PROVIDER_SITE_OTHER): Payer: Medicare Other | Admitting: Psychiatry

## 2012-02-11 DIAGNOSIS — F329 Major depressive disorder, single episode, unspecified: Secondary | ICD-10-CM

## 2012-02-11 NOTE — Patient Instructions (Addendum)
Discussed orally 

## 2012-02-11 NOTE — Progress Notes (Signed)
Patient:  Sherry Cox   DOB: January 30, 1961  MR Number: [id number]  Location: [place]:  [address] [place],  [place], [address]  Start: Monday 02/11/2012 10:15 AM End: Monday 02/11/2012 11:00 AM  Provider/Observer:     Maurice Small, MSW, LCSW   Chief Complaint:      Chief Complaint  Patient presents with  . Depression    Reason For Service:     The patient seeks services due to history of  symptoms of anxiety and depression. She has severe physical health problems due to kidney disease and has been a dialysis patient since April 2006. Symptoms include excessive worrying, panic attacks, depressed mood, and increased emotionality. Patient is seen today for a follow up appointment.  Interventions Strategy:  Supportive therapy, cognitive behavioral therapy  Participation Level:   Active  Participation Quality:  Appropriate      Behavioral Observation:  Fairly Groomed, Alert, and Appropriate.   Current Psychosocial Factors: The patient reports concerns about her health. She Aaso expresses concern regarding a friend who recently was diagnosed with colon cancer.  Content of Session:   Reviewing symptoms, processing feelings, reviewing relaxation and coping techniques,   Current Status:   Patient reports decreased panic attacks and improved sleep pattern  Patient Progress:   Good. The patient reports decreased panic attacks. Patient reports becoming very stressed last week as she had an elevated level of fluid when she went to dialysis. She states being very scared due to to the effect of excess fluid as patient has a bad heart. Patient is able to identify the factors contributing to the excess fluid. Therapist and patient discuss patient's health issues, the effects on her daily functioning, and ways to maintain consistency regarding managing her diet. The patient reports becoming frustrated at times but feeling better about coping with her illness. She has continued to try  to maintain involvement in activities attending church and socializing with family and friends. Patient also reports recently enjoying celebrating her birthday by going  to dinner with her husband and later having a celebration with her siblings.    Target Goals:   1. Decrease anxiety as evidenced by decreased intensity and frequency of panic attacks and decreased worry. 2. Improved ability to identify and challenge thinking errors  as evidenced by increased self-acceptance and setting realistic expectations of self regarding  her physical functioning.  Last Reviewed:   05/21/2011  Goals Addressed Today:    Decreasing anxiety  Impression/Diagnosis:   The patient has a long-standing history of symptoms of anxiety and depression that appear to have become more intense when she became a dialysis patient in 2006. Symptoms include panic attacks, anxiety, and excessive worry and along with depressed mood. Patient also has a number of other health issues that contribute to her anxiety and mood including thyroid disease. Diagnosis: Anxiety disorder NOS, depressive disorder NOS  Diagnosis:  Axis I:  1. Depressive disorder, not elsewhere classified             Axis II: No diagnosis

## 2012-03-18 ENCOUNTER — Other Ambulatory Visit: Payer: Self-pay | Admitting: Cardiology

## 2012-03-18 MED ORDER — SIMVASTATIN 40 MG PO TABS: 40.0000 mg | ORAL_TABLET | Freq: Every day | ORAL | Status: DC

## 2012-03-21 ENCOUNTER — Other Ambulatory Visit: Payer: Self-pay | Admitting: Cardiology

## 2012-04-03 ENCOUNTER — Encounter (HOSPITAL_COMMUNITY): Payer: Self-pay | Admitting: Psychiatry

## 2012-04-03 ENCOUNTER — Ambulatory Visit (INDEPENDENT_AMBULATORY_CARE_PROVIDER_SITE_OTHER): Payer: Medicare Other | Admitting: Psychiatry

## 2012-04-03 DIAGNOSIS — F329 Major depressive disorder, single episode, unspecified: Secondary | ICD-10-CM

## 2012-04-03 MED ORDER — CLONAZEPAM 0.5 MG PO TABS: 0.5000 mg | ORAL_TABLET | Freq: Every day | ORAL | Status: DC

## 2012-04-03 NOTE — Progress Notes (Signed)
Chief complaint Medication management and followup.    History of presenting illness Patient came for her followup appointment.  She's compliant with her medication.  She has one severe panic attack when her sister-in-law was admitted in the hospital for colonoscopy and then she developed infection and required dialysis.  Patient became very anxious and nervous when she find out that sister-in-law required dialysis.  Patient also goes for dialysis .  She's waiting for her transplant however she has not heard any good news about her transplant.  She is taking Zoloft and denies any side effects.  She still has a lot of anxiety on the days when she goes for dialysis.  She denies any crying spells or any agitation.  She sleeping better and does not require Ambien every night..  She inquire Klonopin for her anxiety and insomnia.  She's not drinking or using any illegal substance.  She does not ask for early refills of her Klonopin.  She still smokes however she is thinking seriously to stop smoking.  Recently she finish antibiotic course for her upper respiratory infection.    Current psychiatric medication Klonopin 0.5 mg at bedtime  Zoloft 50 mg one half tablet every day Ambien 5 mg as needed prescribed by primary care physician  Past psychiatric history Patient has been seeing in this office since February 2007.  She had a renal failure in April 2006.  Patient denies any history of suicidal attempt or any inpatient psychiatric treatment.  Patient denies any history of mania or psychosis.  She is taking Zoloft and Klonopin since then and stable on her current medication.  Medical history Patient has chronic renal insufficiency.  She's on dialysis.  She is a history of hyperlipidemia, history of CABG, ejection fraction murmur, coronary artery stenosis and obesity.  Family history Patient has a family history of psychiatric illness.    Psychosocial history Patient was born and raised in [place].  She has been married twice.  She has no children.  She lives with her sister and her son.    Alcohol and substance use history Patient denies any history of alcohol and substance use.  Mental status examination Patient is casually dressed and fairly groomed. She is pleasant but tired .   she had dialysis today.  She maintained good eye contact.  Her speech is clear and fluent and coherent.  Her thought processes are logical linear and goal-directed.  She denies any auditory or visual hallucination. She denies any active or passive suicidal thinking and homicidal thinking. She described her mood is good.  Her attention and concentration is fair.  She is alert and oriented x3. Her insight judgment and impulse control is okay.  Assessment Axis I depressive disorder NOS Axis II deferred Axis III see medical history Axis IV mild to moderate  Plan I will continue her current psychiatric medication.  she still has Zoloft however she will call us if she requires refill.  She requires a new prescription of Klonopin.  I explained the risk and benefits of medication.  I recommend to call us if she is any question or concern or if she feels worsening of the symptoms.  I also explained that there is a possibility she may see a new physician in this office on her next appointment since I am moving to [place] office full-time.  Followup 3 months.    Portion of this note is generated with voice dictation soft and may contain typographical error.

## 2012-04-07 ENCOUNTER — Ambulatory Visit (HOSPITAL_COMMUNITY): Payer: Self-pay | Admitting: Psychiatry

## 2012-04-10 ENCOUNTER — Other Ambulatory Visit (HOSPITAL_COMMUNITY): Payer: Self-pay | Admitting: Psychiatry

## 2012-04-10 NOTE — Telephone Encounter (Signed)
Given new script on 04/03/12 with one more refill.

## 2012-04-14 ENCOUNTER — Other Ambulatory Visit (HOSPITAL_COMMUNITY): Payer: Self-pay | Admitting: Psychiatry

## 2012-04-15 ENCOUNTER — Other Ambulatory Visit (HOSPITAL_COMMUNITY): Payer: Self-pay

## 2012-04-16 ENCOUNTER — Ambulatory Visit (INDEPENDENT_AMBULATORY_CARE_PROVIDER_SITE_OTHER): Payer: Medicare Other | Admitting: Psychiatry

## 2012-04-16 DIAGNOSIS — F329 Major depressive disorder, single episode, unspecified: Secondary | ICD-10-CM

## 2012-04-17 NOTE — Patient Instructions (Signed)
Discussed orally 

## 2012-04-17 NOTE — Progress Notes (Signed)
Patient:  Sherry Cox   DOB: 1961-04-16  MR Number: [id number]  Location: [facility]:  [address] [place],  [place], [address]  Start: Wednesday 04/16/2012 11:05 AM End: Wednesday 04/16/2012 11:55 AM  Provider/Observer:     Maurice Small, MSW, LCSW   Chief Complaint:      Chief Complaint  Patient presents with  . Depression    Reason For Service:     The patient seeks services due to history of  symptoms of anxiety and depression. She has severe physical health problems due to kidney disease and has been a dialysis patient since April 2006. Symptoms include excessive worrying, panic attacks, depressed mood, and increased emotionality. Patient is seen today for a follow up appointment.  Interventions Strategy:  Supportive therapy, cognitive behavioral therapy  Participation Level:   Active  Participation Quality:  Appropriate      Behavioral Observation:  Fairly Groomed, Drowsy, and Appropriate.   Current Psychosocial Factors: The patient reports ongoing concerns about her health.  Content of Session:   Reviewing symptoms, processing feelings, discussing the impact of patient's physical illness, identifying patient's strengths and coping with her illness, reviewing relaxation and coping techniques,   Current Status:   Patient reports improved mood but continued anxiety.     Patient Progress:   Good. Patient reports increased anxiety and stress in the past couple of weeks due to to being sick with a cold and bronchitis. She also is scheduled for medical tests to determine if she has a tumor on her lungs. Patient also reports worry about her sister-in-law who recently had complications from a colonoscopy resulting in her sister-in-law's leg being amputated. Patient reports being recommended to have  a colonoscopy but now has decided against it due to fear. 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 Patient and therapist discuss patient's strengths in managing her physical condition. Patient is more observant of her reactions to others' problems and effects on patient's stress and anxiety level. She reports increased efforts to set boundaries and trying not to take on others' issues. Therapist works with patient to review relaxation and coping techniques. Patient is trying to maintain involvement in activities and reports attending church regularly, socializing with family and friends, and recently helping out a friend with volunteer work.    Target Goals:   1. Decrease anxiety as evidenced by decreased intensity and frequency of panic attacks and decreased worry. 2. Improved ability to identify and challenge thinking errors  as evidenced by increased self-acceptance and setting realistic expectations of self regarding  her physical functioning.  Last Reviewed:   05/21/2011  Goals Addressed Today:    Decreasing anxiety  Impression/Diagnosis:   The patient has a long-standing history of symptoms of anxiety and depression that appear to have become more intense when she became a dialysis patient in 2006. Symptoms include panic attacks, anxiety, and excessive worry and along with depressed mood. Patient also has a number of other health issues that contribute to her anxiety and mood including thyroid disease. Diagnosis: Anxiety disorder NOS, depressive disorder NOS  Diagnosis:  Axis I:  1. Depressive disorder, not elsewhere classified             Axis II: No diagnosis

## 2012-04-26 DIAGNOSIS — Z87891 Personal history of nicotine dependence: Secondary | ICD-10-CM

## 2012-04-26 HISTORY — DX: Personal history of nicotine dependence: Z87.891

## 2012-05-19 ENCOUNTER — Other Ambulatory Visit (HOSPITAL_COMMUNITY): Payer: Self-pay | Admitting: *Deleted

## 2012-05-20 ENCOUNTER — Other Ambulatory Visit (HOSPITAL_COMMUNITY): Payer: Self-pay | Admitting: Psychiatry

## 2012-05-20 MED ORDER — SERTRALINE HCL 50 MG PO TABS: 75.0000 mg | ORAL_TABLET | Freq: Every day | ORAL | Status: DC

## 2012-05-23 ENCOUNTER — Other Ambulatory Visit (HOSPITAL_COMMUNITY): Payer: Self-pay | Admitting: *Deleted

## 2012-05-23 MED ORDER — SERTRALINE HCL 50 MG PO TABS: 75.0000 mg | ORAL_TABLET | Freq: Every day | ORAL | Status: DC

## 2012-05-30 ENCOUNTER — Other Ambulatory Visit (HOSPITAL_COMMUNITY): Payer: Self-pay | Admitting: *Deleted

## 2012-06-10 ENCOUNTER — Other Ambulatory Visit (HOSPITAL_COMMUNITY): Payer: Self-pay | Admitting: Psychiatry

## 2012-06-10 DIAGNOSIS — F329 Major depressive disorder, single episode, unspecified: Secondary | ICD-10-CM

## 2012-06-10 MED ORDER — CLONAZEPAM 0.5 MG PO TABS: 0.5000 mg | ORAL_TABLET | Freq: Every day | ORAL | Status: DC

## 2012-06-11 ENCOUNTER — Ambulatory Visit (INDEPENDENT_AMBULATORY_CARE_PROVIDER_SITE_OTHER): Payer: Medicare Other | Admitting: Psychiatry

## 2012-06-11 DIAGNOSIS — F329 Major depressive disorder, single episode, unspecified: Secondary | ICD-10-CM

## 2012-06-11 NOTE — Progress Notes (Signed)
Patient:  Sherry Cox   DOB: [birth year]  MR Number: [id number]  Location: [facility]:  [address] [place],  [place], [address]  Start: Wednesday 06/11/2012 11:10 AM End: Wednesday 06/11/2012 11:55 AM  Provider/Observer:     Maurice Small, MSW, LCSW   Chief Complaint:      Chief Complaint  Patient presents with  . Anxiety    Reason For Service:     The patient seeks services due to history of  symptoms of anxiety and depression. She has severe physical health problems due to kidney disease and has been a dialysis patient since April 2006. Symptoms include excessive worrying, panic attacks, depressed mood, and increased emotionality. Patient is seen today for a follow up appointment.  Interventions Strategy:  Supportive therapy, cognitive behavioral therapy  Participation Level:   Active  Participation Quality:  Appropriate      Behavioral Observation:  Fairly Groomed and Appropriate, hands shaking  Current Psychosocial Factors: The patient reports ongoing concerns about her health as well as concerns re: family members' health including a sister who recently had a stroke.  Content of Session:   Reviewing symptoms, processing feelings,reviewing relaxation and coping techniques, reviewing ways to intervene in panic cycle,   Current Status:   Patient reports increased sleep difficulty ( 4 hours per night), increased anxiety, excessive worrying, and increased panic attacks ( 8+ daily)     Patient Progress:   Fair. Patient reports increased anxiety and stress as she remains sick with a cold and bronchitis. She reports recovering from a cold in bronchitis after about 6 weeks buthaving symptoms again 2 weeks later. She has seen her primary care physician and has begun taking a second round of antibiotics. Patient reports the increased breathing difficulty has contributed to increased anxiety. She also reports worry about several family members who have health issues  including a sister who recently had a stroke. She reports increased difficulty in trying to set boundaries and avoiding taking on others' issues. Patient also reports increased panic attacks and is experiencing at least 8 per day. She also reports increased sensitivity to TV and noise. Patient reports she has increased her dosage of Klonopin from 1/2 or 1 pill to 3 pills daily. Therapist works with patient to review relaxation techniques as well as ways to intervene with a panic cycle. Patient also agrees to see psychiatrist Dr. Gilford Rile for an earlier appointment.    Target Goals:   1. Decrease anxiety as evidenced by decreased intensity and frequency of panic attacks and decreased worry. 2. Improved ability to identify and challenge thinking errors  as evidenced by increased self-acceptance and setting realistic expectations of self regarding  her physical functioning.  Last Reviewed:   05/21/2011  Goals Addressed Today:    Decreasing anxiety  Impression/Diagnosis:   The patient has a long-standing history of symptoms of anxiety and depression that appear to have become more intense when she became a dialysis patient in 2006. Symptoms include panic attacks, anxiety, and excessive worry and along with depressed mood. Patient also has a number of other health issues that contribute to her anxiety and mood including thyroid disease. Diagnosis: Anxiety disorder NOS, depressive disorder NOS  Diagnosis:  Axis I:  1. Depressive disorder, not elsewhere classified             Axis II: No diagnosis

## 2012-06-11 NOTE — Patient Instructions (Signed)
Discussed orally 

## 2012-06-13 ENCOUNTER — Ambulatory Visit (HOSPITAL_COMMUNITY): Payer: Self-pay | Admitting: Psychiatry

## 2012-06-17 ENCOUNTER — Other Ambulatory Visit: Payer: Self-pay | Admitting: Cardiology

## 2012-06-17 MED ORDER — NITROGLYCERIN 0.4 MG SL SUBL: 0.4000 mg | SUBLINGUAL_TABLET | SUBLINGUAL | Status: DC | PRN

## 2012-06-23 ENCOUNTER — Encounter: Payer: Self-pay | Admitting: Cardiology

## 2012-06-26 ENCOUNTER — Encounter (HOSPITAL_COMMUNITY): Payer: Self-pay | Admitting: Psychiatry

## 2012-06-26 ENCOUNTER — Ambulatory Visit (INDEPENDENT_AMBULATORY_CARE_PROVIDER_SITE_OTHER): Payer: Medicare Other | Admitting: Psychiatry

## 2012-06-26 VITALS — Wt 208.4 lb

## 2012-06-26 DIAGNOSIS — F3289 Other specified depressive episodes: Secondary | ICD-10-CM

## 2012-06-26 DIAGNOSIS — F411 Generalized anxiety disorder: Secondary | ICD-10-CM

## 2012-06-26 DIAGNOSIS — Z720 Tobacco use: Secondary | ICD-10-CM

## 2012-06-26 DIAGNOSIS — F329 Major depressive disorder, single episode, unspecified: Secondary | ICD-10-CM

## 2012-06-26 MED ORDER — GABAPENTIN 100 MG PO CAPS: 100.0000 mg | ORAL_CAPSULE | Freq: Three times a day (TID) | ORAL | Status: DC

## 2012-06-26 MED ORDER — SERTRALINE HCL 100 MG PO TABS: 100.0000 mg | ORAL_TABLET | Freq: Every day | ORAL | Status: DC

## 2012-06-26 MED ORDER — CLONAZEPAM 0.5 MG PO TABS: 0.5000 mg | ORAL_TABLET | Freq: Every day | ORAL | Status: DC

## 2012-06-26 NOTE — Patient Instructions (Addendum)
To prevent dementia Play cards or broad games Learn a musical instrument Dancing Use the other hand to brush teeth or open doors Get regular exercise and read or study some new subject  CUT BACK/CUT OUT on sugar and carbohydrates, that means very limited fruits and starchy vegetables and very limited grains, breads  The goal is low GLYCEMIC INDEX.  CUT OUT all wheat, rye, or barley  HIGH fat and LOW carbohydrate diet is the KEY.  Eat avocados, eggs, lean meat like grass fed beef and chicken  Nuts and seeds would be good foods as well.   Stevia is an excellent sweetener.  Safe for the brain.   Almond butter is awesome.  Rice Chex and Corn Chex would be okay.  "Native Wisdom for [employer] by Arty Baumgartner could be very helpful.  Yoga is a very helpful exercise method.  On TV or on line Gaiam is a source of high quality information about yoga and videos on yoga.  Ulysees Barns is the world's number one video yoga instructor according to some experts.  There are exceptional health benefits that can be achieved through yoga.  The main principles of yoga is acceptance, no competition, no comparison, and no judgement.  It is exceptional in helping people meditate and get to a very relaxed state.   Keep up your bathroom window observatory routine.  Strongly consider attending at least 65 Alanon Meetings to help you learn about how your helping others to the exclusion of helping yourself is actually hurting yourself and is actually an addiction to fixing others and that you need to work the 12 Step to Happiness through the [employer]. Al-Anon Family Groups could be helpful with how to deal with substance abusing family and friends. Or your own issues of being in victim role.  There are only 40 Alanon Family Group meetings a week here in [place].  Online are current listing of those meetings @ [place]on.org/html/meetings.html  There are [employer].  Search on  line and there you can learn the format and can access the schedule for yourself.  Their number is [phone]  Have a happy holiday.

## 2012-06-26 NOTE — Addendum Note (Signed)
Addended by: Darrol Jump on: 06/26/2012 03:14 PM   Modules accepted: Orders
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
## 2012-07-23 ENCOUNTER — Ambulatory Visit (HOSPITAL_COMMUNITY): Payer: Self-pay | Admitting: Psychiatry

## 2012-07-30 ENCOUNTER — Ambulatory Visit (INDEPENDENT_AMBULATORY_CARE_PROVIDER_SITE_OTHER): Payer: Medicare Other | Admitting: Psychiatry

## 2012-07-30 DIAGNOSIS — F3289 Other specified depressive episodes: Secondary | ICD-10-CM

## 2012-07-30 DIAGNOSIS — F411 Generalized anxiety disorder: Secondary | ICD-10-CM

## 2012-07-30 DIAGNOSIS — F419 Anxiety disorder, unspecified: Secondary | ICD-10-CM

## 2012-07-30 DIAGNOSIS — F329 Major depressive disorder, single episode, unspecified: Secondary | ICD-10-CM

## 2012-07-30 NOTE — Progress Notes (Signed)
Patient:  Sherry Cox   DOB: 03-19-1961  MR Number: [id number]  Location: [facility]:  [address] [place],  [place], [address]  Start: Wednesday 07/30/2012 11:00 AM End: Wednesday 07/30/2012 11:50 AM  Provider/Observer:     Maurice Small, MSW, LCSW   Chief Complaint:      Chief Complaint  Patient presents with  . Anxiety  . Depression    Reason For Service:     The patient seeks services due to history of  symptoms of anxiety and depression. She has severe physical health problems due to kidney disease and has been a dialysis patient since April 2006. Symptoms include excessive worrying, panic attacks, depressed mood, and increased emotionality. Patient is seen today for a follow up appointment.  Interventions Strategy:  Supportive therapy, cognitive behavioral therapy  Participation Level:   Active  Participation Quality:  Appropriate      Behavioral Observation:  Fairly Groomed and Appropriate, hands shaking  Current Psychosocial Factors: The patient reports ongoing concerns about her health as well as concerns re: family members' health.  Content of Session:   Reviewing symptoms, processing feelings,reviewing relaxation and coping techniques, reviewing ways to intervene in panic cycle, identifying areas within patient's control   Current Status:   Patient reports decreased panic attacks but continued anxiety.She reports her mood has been happy     Patient Progress:   Good. Patient reports increase stress as she has had ongoing health issues related to colds and sinus infections. She also reports the death of 2 acquaintances as well as ongoing health issues for several family members. However, patient reports decreased panic attacks as she states trying not to worry about others' as she has been in very sick. Therapist works with patient to examine thought patterns and effects on her mood and behavior. Therapist also works with patient to identify areas within  patient's control. Therapist and patient discuss boundary issues as well as ways to set, maintain, and respect boundaries. Patient has tried to maintain involvement in activity and socializing with family and friends. She continues to experience anxiety about being alone.   Target Goals:   1. Decrease anxiety as evidenced by decreased intensity and frequency of panic attacks and decreased worry. 2. Improved ability to identify and challenge thinking errors  as evidenced by increased self-acceptance and setting realistic expectations of self regarding  her physical functioning.  Last Reviewed:   05/21/2011  Goals Addressed Today:    Decreasing anxiety  Impression/Diagnosis:   The patient has a long-standing history of symptoms of anxiety and depression that appear to have become more intense when she became a dialysis patient in 2006. Symptoms include panic attacks, anxiety, and excessive worry and along with depressed mood. Patient also has a number of other health issues that contribute to her anxiety and mood including thyroid disease. Diagnosis: Anxiety disorder NOS, depressive disorder NOS  Diagnosis:  Axis I:  1. Depressive disorder   2. Anxiety disorder             Axis II: No diagnosis

## 2012-07-30 NOTE — Patient Instructions (Signed)
Discussed orally 

## 2012-08-11 ENCOUNTER — Ambulatory Visit: Payer: Self-pay | Admitting: Cardiology

## 2012-08-15 ENCOUNTER — Ambulatory Visit: Payer: Self-pay | Admitting: Cardiology

## 2012-08-15 ENCOUNTER — Other Ambulatory Visit: Payer: Self-pay | Admitting: *Deleted

## 2012-08-15 DIAGNOSIS — I6529 Occlusion and stenosis of unspecified carotid artery: Secondary | ICD-10-CM

## 2012-08-20 ENCOUNTER — Ambulatory Visit: Payer: Self-pay | Admitting: Cardiology

## 2012-08-27 ENCOUNTER — Ambulatory Visit (INDEPENDENT_AMBULATORY_CARE_PROVIDER_SITE_OTHER): Payer: Medicare Other | Admitting: Psychiatry

## 2012-08-27 ENCOUNTER — Encounter (HOSPITAL_COMMUNITY): Payer: Self-pay | Admitting: Psychiatry

## 2012-08-27 VITALS — Wt 210.8 lb

## 2012-08-27 DIAGNOSIS — F3289 Other specified depressive episodes: Secondary | ICD-10-CM

## 2012-08-27 DIAGNOSIS — F411 Generalized anxiety disorder: Secondary | ICD-10-CM

## 2012-08-27 DIAGNOSIS — E663 Overweight: Secondary | ICD-10-CM

## 2012-08-27 MED ORDER — SERTRALINE HCL 100 MG PO TABS: 100.0000 mg | ORAL_TABLET | Freq: Every day | ORAL | Status: DC

## 2012-08-27 MED ORDER — CLONAZEPAM 0.5 MG PO TABS: 0.5000 mg | ORAL_TABLET | Freq: Every day | ORAL | Status: DC

## 2012-08-27 MED ORDER — GABAPENTIN 100 MG PO CAPS: 100.0000 mg | ORAL_CAPSULE | Freq: Three times a day (TID) | ORAL | Status: DC

## 2012-08-27 NOTE — Patient Instructions (Addendum)
CUT BACK/CUT OUT on sugar and carbohydrates, that means very limited fruits and starchy vegetables and very limited grains, breads  The goal is low GLYCEMIC INDEX.  CUT OUT all wheat, rye, or barley for the GLUTEN in them.  HIGH fat and LOW carbohydrate diet is the KEY.  Eat avocados, eggs, lean meat like grass fed beef and chicken  Nuts and seeds would be good foods as well.   Stevia is an excellent sweetener.  Safe for the brain.   Almond butter is awesome.  Check out all this on the Internet.  Ned Card is a carbonated drink that uses Stevia.  Walking 8 minutes a day and increasing this gradually, BUT keeping a record and bringing that record in to the next visit would be very helpful.  Call if problems or concerns.
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
## 2012-08-28 ENCOUNTER — Other Ambulatory Visit: Payer: Medicare Other

## 2012-08-28 ENCOUNTER — Encounter: Payer: Self-pay | Admitting: Neurosurgery

## 2012-08-28 ENCOUNTER — Ambulatory Visit: Payer: Medicare Other | Admitting: Vascular Surgery

## 2012-08-29 ENCOUNTER — Ambulatory Visit (INDEPENDENT_AMBULATORY_CARE_PROVIDER_SITE_OTHER): Payer: Medicare Other | Admitting: Neurosurgery

## 2012-08-29 ENCOUNTER — Encounter: Payer: Self-pay | Admitting: Neurosurgery

## 2012-08-29 ENCOUNTER — Other Ambulatory Visit (INDEPENDENT_AMBULATORY_CARE_PROVIDER_SITE_OTHER): Payer: Medicare Other | Admitting: *Deleted

## 2012-08-29 VITALS — BP 136/67 | HR 63 | Resp 18 | Ht 58.5 in | Wt 210.0 lb

## 2012-08-29 DIAGNOSIS — I6529 Occlusion and stenosis of unspecified carotid artery: Secondary | ICD-10-CM

## 2012-08-29 NOTE — Progress Notes (Signed)
[facility] [place] Carotid Office Note  CC: Carotid surveillance Referring Physician:  Fields  History of Present Illness: 52 year old female patient of Dr. Oneida Alar who is followed for known bilateral carotid stenosis. The patient denies any signs or symptoms of CVA, TIA, amaurosis fugax or any neural deficit. The patient denies any new medical diagnoses or recent surgery.  Past Medical History  Diagnosis Date  . Overweight   . Anxiety disorder   . Carotid artery stenosis     12/10/2008 doppler 0000000 RICA, [id number] LICA/see evaluation by Dr. Oneida Alar 2011  . CAD (coronary artery disease)     Total RCA, stress echo [facility] 11/2008-no ischemia, EF 55%; h/o CABG  . Hyperlipidemia   . ESRD on dialysis     Transplant consideration  . Tobacco abuse   . Hx of CABG     Off pump LIMA to LAD  05/2006  . Ejection fraction     EF 60-65%, echo, June, 2009  . Murmur     October, 2012  . Neuropathy   . Anxiety   . Hx of tobacco use, presenting hazards to health 04/26/2012  . Irregular heart beat     ROS: [x]  Positive   [ ]  Denies    General: [ ]  Weight loss, [ ]  Fever, [ ]  chills Neurologic: [ ]  Dizziness, [ ]  Blackouts, [ ]  Seizure [ ]  Stroke, [ ]  "Mini stroke", [ ]  Slurred speech, [ ]  Temporary blindness; [ ]  weakness in arms or legs, [ ]  Hoarseness Cardiac: [ ]  Chest pain/pressure, [ ]  Shortness of breath at rest [ ]  Shortness of breath with exertion, [ ]  Atrial fibrillation or irregular heartbeat Vascular: [ ]  Pain in legs with walking, [ ]  Pain in legs at rest, [ ]  Pain in legs at night,  [ ]  Non-healing ulcer, [ ]  Blood clot in vein/DVT,   Pulmonary: [ ]  Home oxygen, [ ]  Productive cough, [ ]  Coughing up blood, [ ]  Asthma,  [ ]  Wheezing Musculoskeletal:  [ ]  Arthritis, [ ]  Low back pain, [ ]  Joint pain Hematologic: [ ]  Easy Bruising, [ ]  Anemia; [ ]  Hepatitis Gastrointestinal: [ ]  Blood in stool, [ ]  Gastroesophageal Reflux/heartburn, [ ]  Trouble swallowing Urinary: [  ] chronic Kidney disease, [ ]  on HD - [ ]  MWF or [ ]  TTHS, [ ]  Burning with urination, [ ]  Difficulty urinating Skin: [ ]  Rashes, [ ]  Wounds Psychological: [ ]  Anxiety, [ ]  Depression   Social History History  Substance Use Topics  . Smoking status: Former Smoker -- 0.30 packs/day for 30 years    Types: Cigarettes    Quit date: 04/26/2012  . Smokeless tobacco: Never Used     Comment: 1 cigg per day  . Alcohol Use: No    Family History Family History  Problem Relation Age of Onset  . Depression Sister   . Dementia Sister   . OCD Sister   . Diabetes Sister   . Hypertension Sister   . Heart attack Sister   . Bipolar disorder Brother   . Drug abuse Brother   . Diabetes Brother   . Hypertension Brother   . Bipolar disorder Other   . ADD / ADHD Other   . Seizures Other   . Bipolar disorder Other   . Alcohol abuse Other   . Dementia Mother   . Heart disease Mother   . Hypertension Mother   . Heart attack Mother   . Alcohol abuse Father   .  Heart disease Father     Heart Disease before age 63  . Hypertension Father   . Heart attack Father   . Dementia Maternal Aunt   . Paranoid behavior Neg Hx   . Schizophrenia Neg Hx   . Sexual abuse Neg Hx   . Physical abuse Neg Hx   . OCD Sister   . Heart disease Sister     Heart Disease before age 63    No Known Allergies  Current Outpatient Prescriptions  Medication Sig Dispense Refill  . albuterol (PROAIR HFA) 108 (90 BASE) MCG/ACT inhaler Inhale 2 puffs into the lungs as needed.        Marland Kitchen aspirin 325 MG tablet Take 325 mg by mouth daily.        . clonazePAM (KLONOPIN) 0.5 MG tablet Take 1 tablet (0.5 mg total) by mouth at bedtime.  30 tablet  0  . folic acid-vitamin b complex-vitamin c-selenium-zinc (DIALYVITE) 3 MG TABS Take 1 tablet by mouth daily.        Marland Kitchen gabapentin (NEURONTIN) 100 MG capsule Take 1 capsule (100 mg total) by mouth 3 (three) times daily.  90 capsule  2  . HYDROcodone-acetaminophen (VICODIN) 5-500 MG per  tablet Take one tablet twice a day       . levothyroxine (SYNTHROID, LEVOTHROID) 150 MCG tablet Take 1 tablet by mouth Daily.      . metoprolol (LOPRESSOR) 50 MG tablet TAKE 1 BY MOUTH TWO TIMES  DAILY  180 tablet  3  . nitroGLYCERIN (NITROSTAT) 0.4 MG SL tablet Place 1 tablet (0.4 mg total) under the tongue every 5 (five) minutes as needed for chest pain.  75 tablet  3  . sertraline (ZOLOFT) 100 MG tablet Take 1 tablet (100 mg total) by mouth daily.  30 tablet  1  . sevelamer (RENVELA) 800 MG tablet Take 5 tablets by mouth with meals and 3 tablets by mouth with snacks.       . simvastatin (ZOCOR) 40 MG tablet Take 1 tablet (40 mg total) by mouth at bedtime. PATIENT NEED CHOLESTEROL LABS  30 tablet  6  . sodium bicarbonate 650 MG tablet Take 1 tablet by mouth two times a day.       . sulfacetamide (BLEPH-10) 10 % ophthalmic solution       . zolpidem (AMBIEN) 5 MG tablet Take 5 mg by mouth at bedtime as needed.         No current facility-administered medications for this visit.    Physical Examination  Filed Vitals:   08/29/12 1406  BP: 136/67  Pulse: 63  Resp: 18    Body mass index is 43.14 kg/(m^2).  General:  WDWN in NAD Gait: Normal HEENT: WNL Eyes: Pupils equal Pulmonary: normal non-labored breathing , without Rales, rhonchi,  wheezing Cardiac: RRR, without  Murmurs, rubs or gallops; Abdomen: soft, NT, no masses Skin: no rashes, ulcers noted  Vascular Exam Pulses: 3+ radial pulses Carotid bruits: Carotid bruits heard bilaterally Extremities without ischemic changes, no Gangrene , no cellulitis; no open wounds;  Musculoskeletal: no muscle wasting or atrophy   Neurologic: A&O X 3; Appropriate Affect ; SENSATION: normal; MOTOR FUNCTION:  moving all extremities equally. Speech is fluent/normal  Non-Invasive Vascular Imaging CAROTID DUPLEX 08/29/2012  Right ICA 60 - 79 % stenosis Left ICA 40 - 59 % stenosis   ASSESSMENT/PLAN: Asymptomatic patient with advanced right ICA  stenosis that is stable from previous exam. The patient will followup in 6 months with repeat  carotid duplex. The patient knows the signs and symptoms of CVA and knows to call 911 should that occur. The patient's questions were encouraged and answered, she is in agreement with this plan.  Beatris Ship ANP   Clinic MD: Bridgett Larsson

## 2012-09-01 ENCOUNTER — Other Ambulatory Visit: Payer: Self-pay | Admitting: *Deleted

## 2012-09-01 ENCOUNTER — Other Ambulatory Visit: Payer: Self-pay

## 2012-09-01 MED ORDER — METOPROLOL TARTRATE 50 MG PO TABS: 50.0000 mg | ORAL_TABLET | Freq: Two times a day (BID) | ORAL | Status: DC

## 2012-09-10 ENCOUNTER — Ambulatory Visit (INDEPENDENT_AMBULATORY_CARE_PROVIDER_SITE_OTHER): Payer: Medicare Other | Admitting: Psychiatry

## 2012-09-10 DIAGNOSIS — F411 Generalized anxiety disorder: Secondary | ICD-10-CM

## 2012-09-10 DIAGNOSIS — F3289 Other specified depressive episodes: Secondary | ICD-10-CM

## 2012-09-10 NOTE — Progress Notes (Signed)
Patient:  Sherry Cox   DOB: Oct 03, 1960  MR Number: [id number]  Location: [place]:  [address] [place],  [place], [address]  Start: Wednesday 09/10/2012 10:10 AM End: Wednesday 09/10/2012 11:00 AM  Provider/Observer:     Maurice Small, MSW, LCSW   Chief Complaint:      Chief Complaint  Patient presents with  . Anxiety  . Depression    Reason For Service:     The patient seeks services due to history of  symptoms of anxiety and depression. She has severe physical health problems due to kidney disease and has been a dialysis patient since April 2006. Symptoms include excessive worrying, panic attacks, depressed mood, and increased emotionality. Patient is seen today for a follow up appointment.  Interventions Strategy:  Supportive therapy, cognitive behavioral therapy  Participation Level:   Active  Participation Quality:  Appropriate      Behavioral Observation:  Fairly Groomed and Appropriate, hands shaking  Current Psychosocial Factors: The patient reports ongoing concerns about her health  Content of Session:   Reviewing symptoms, processing feelings, examining thought patterns and effects on mood and behavior, identifying and challenging cognitive distortions, practicing a relaxation technique  Current Status:   Patient reports decreased panic attacks but continued anxiety.She reports her mood has been happy     Patient Progress:   Good. Patient reports decrease stress as she has recovered from colds and sinus infections. She reports decreased panic attacks and states having a panic attack every 2 or 3 days. She also reports there have been few family issues.  She reports increased motivation to discontinue nicotine use as her doctor informed her 2 weeks ago that the 70% blockage and the 45% blockage in her arteries were related to her nicotine use.  Patient has reduced nicotine use to 1/2 cigarette 2-3 days per week but expresses desire to quit. Therapist  works with patient to identify triggers of nicotine use. Patient identifies going to  dialysis as the trigger. Therapist works with patient to identify her thought patterns related to going to dialysis and effects on patient's mood and behavior. Therapist works with patient to identify and challenge cognitive distortions.  Patient identifies catastrophizing when she does not see fellow dialysis patients. Therapist also works with patient to practice a relaxation technique using visualization patient is pleased that she has improved her efforts in setting and maintaining boundaries regarding family members' problems. Patient states  now trying not to get into other people's problems by avoiding asking for more information about the problems.    Target Goals:   1. Decrease anxiety as evidenced by decreased intensity and frequency of panic attacks and decreased worry. 2. Improved ability to identify and challenge thinking errors  as evidenced by increased self-acceptance and setting realistic expectations of self regarding  her physical functioning.  Last Reviewed:   05/21/2011  Goals Addressed Today:    Decreasing anxiety  Impression/Diagnosis:   The patient has a long-standing history of symptoms of anxiety and depression that appear to have become more intense when she became a dialysis patient in 2006. Symptoms include panic attacks, anxiety, and excessive worry and along with depressed mood. Patient also has a number of other health issues that contribute to her anxiety and mood including thyroid disease. Diagnosis: Anxiety disorder NOS, depressive disorder NOS  Diagnosis:  Axis I:  Depressive disorder, not elsewhere classified  Anxiety state, unspecified          Axis II: No diagnosis

## 2012-09-10 NOTE — Patient Instructions (Addendum)
Continue using relaxation breathing  Use distracting activities - puzzles  Use visualization exercise - "mini vacation" - visualize relaxed place using 5 senses - sight hearing, touch, taste, and smell

## 2012-09-24 ENCOUNTER — Ambulatory Visit (HOSPITAL_COMMUNITY): Payer: Self-pay | Admitting: Psychiatry

## 2012-09-30 ENCOUNTER — Other Ambulatory Visit (HOSPITAL_COMMUNITY): Payer: Self-pay | Admitting: Psychiatry

## 2012-09-30 DIAGNOSIS — F329 Major depressive disorder, single episode, unspecified: Secondary | ICD-10-CM

## 2012-09-30 MED ORDER — SERTRALINE HCL 100 MG PO TABS: 100.0000 mg | ORAL_TABLET | Freq: Every day | ORAL | Status: DC

## 2012-09-30 NOTE — Telephone Encounter (Signed)
Sent message that script will be filled at appointment in AM by eScript.

## 2012-09-30 NOTE — Telephone Encounter (Signed)
Request for refill will be given tomorrow at the regularly scheduled appointment then.

## 2012-09-30 NOTE — Addendum Note (Signed)
Addended by: Darrol Jump on: 09/30/2012 01:06 PM   Modules accepted: Orders

## 2012-10-01 ENCOUNTER — Ambulatory Visit (INDEPENDENT_AMBULATORY_CARE_PROVIDER_SITE_OTHER): Payer: Medicare Other | Admitting: Psychiatry

## 2012-10-01 ENCOUNTER — Encounter (HOSPITAL_COMMUNITY): Payer: Self-pay | Admitting: Psychiatry

## 2012-10-01 ENCOUNTER — Telehealth (HOSPITAL_COMMUNITY): Payer: Self-pay | Admitting: Psychiatry

## 2012-10-01 VITALS — Wt 210.2 lb

## 2012-10-01 DIAGNOSIS — F411 Generalized anxiety disorder: Secondary | ICD-10-CM

## 2012-10-01 DIAGNOSIS — F329 Major depressive disorder, single episode, unspecified: Secondary | ICD-10-CM

## 2012-10-01 DIAGNOSIS — F5105 Insomnia due to other mental disorder: Secondary | ICD-10-CM

## 2012-10-01 MED ORDER — CLONAZEPAM 0.5 MG PO TABS: 0.5000 mg | ORAL_TABLET | Freq: Every day | ORAL | Status: DC

## 2012-10-01 MED ORDER — GABAPENTIN 100 MG PO CAPS: 100.0000 mg | ORAL_CAPSULE | Freq: Three times a day (TID) | ORAL | Status: DC

## 2012-10-01 MED ORDER — SERTRALINE HCL 100 MG PO TABS: 100.0000 mg | ORAL_TABLET | Freq: Every day | ORAL | Status: DC

## 2012-10-01 NOTE — Patient Instructions (Signed)
Try using the Neurontin in place of either the Klonopin or the Ambien.  It is better for your brain.  Call if problems or concerns.

## 2012-10-01 NOTE — Progress Notes (Signed)
[facility] [phone] Progress Note Sherry Cox MRN: [id number] DOB: [birth year] Age: 52 y.o.  Date: 10/01/2012 Start Time: 12:20 PM End Time: 12:30 PM  Chief Complaint: Chief Complaint  Patient presents with  . Depression  . Follow-up  . Medication Refill   Subjective: "I'm feeling good today". Depression 0/10 and Anxiety 0/10, where 1 is the best and 10 is the worst.  Pain is 6/10 in the lower back region.   History of presenting illness Patient came for her followup appointment.  Pt reports that she is compliant with the psychotropic medications with good benefit and no noticeable side effects.  She has taken the Neurontin in the evening and has not noted much change with that.  Am encouraging her to take the Neurontin anytime she might use Klonopin and or Ambien.  Suggest that she use 2 or 3 at that time.  Current psychiatric medication Klonopin 0.5 mg at bedtime  Zoloft100 mg every noon Neurontin 100 mg in the evenings Ambien 5 mg as needed prescribed by primary care physician  Past psychiatric history Patient has been seeing in this office since February 2007.  She had a renal failure in April 2006.  Patient denies any history of suicidal attempt or any inpatient psychiatric treatment.  Patient denies any history of mania or psychosis.  She is taking Zoloft and Klonopin since then and stable on her current medication.  Medical history Patient has chronic renal insufficiency.  She's on dialysis.  She is a history of hyperlipidemia, history of CABG, ejection fraction murmur, coronary artery stenosis and obesity.  Family history Patient has a family history of psychiatric illness.   family history includes ADD / ADHD in her other; Alcohol abuse in her father and other; Bipolar disorder in her brother and others; Dementia in her maternal aunt, mother, and sister; Depression in her sister; Diabetes in her brother and sister; Drug abuse in her brother; Heart attack in  her father, mother, and sister; Heart disease in her father, mother, and sister; Hypertension in her brother, father, mother, and sister; OCD in her sisters; and Seizures in her other.  There is no history of Paranoid behavior, and Schizophrenia, and Sexual abuse, and Physical abuse, .  Psychosocial history Patient was born and raised in [place].  She has been married twice.  She has no children.  She lives with her sister and her son.    Alcohol and substance use history Patient denies any history of alcohol and substance use.  Mental status examination Patient is casually dressed and fairly groomed. She is pleasant but tired .   she had dialysis today.  She maintained good eye contact.  Her speech is clear and fluent and coherent.  Her thought processes are logical linear and goal-directed.  She denies any auditory or visual hallucination. She denies any active or passive suicidal thinking and homicidal thinking. She described her mood is good.  Her attention and concentration is fair.  She is alert and oriented x3. Her insight judgment and impulse control is okay.  Lab Results: No results found for this or any previous visit (from the past 8736 hour(s)). Gets labs drawn at dialysis.  She does not check her blood sugar.  Assessment Axis I depressive disorder NOS Axis II deferred Axis III see medical history Axis IV mild to moderate  Plan: I took her vitals.  I reviewed CC, tobacco/med/surg Hx, meds effects/ side effects, problem list, therapies and responses as well as current situation/symptoms discussed  options. Continue current effective medications. See orders and pt instructions for more details.  Medical Decision Making Problem Points:  Established problem, stable/improving (1), Review of last therapy session (1) and Review of psycho-social stressors (1) Data Points:  Review or order clinical lab tests (1) Review of medication regiment & side effects (2) Review of new  medications or change in dosage (2)  I certify that outpatient services furnished can reasonably be expected to improve the patient's condition.   Rudean Curt, MD, [facility]

## 2012-10-01 NOTE — Addendum Note (Signed)
Addended by: Darrol Jump on: 10/01/2012 12:39 PM   Modules accepted: Orders

## 2012-10-02 ENCOUNTER — Telehealth (HOSPITAL_COMMUNITY): Payer: Self-pay | Admitting: Psychiatry

## 2012-10-02 NOTE — Telephone Encounter (Signed)
Earlier discussed with the pharmacist a reduced quantity for the Neurontin.

## 2012-10-13 ENCOUNTER — Other Ambulatory Visit: Payer: Self-pay | Admitting: Neurology

## 2012-10-13 DIAGNOSIS — G5710 Meralgia paresthetica, unspecified lower limb: Secondary | ICD-10-CM

## 2012-10-13 DIAGNOSIS — IMO0002 Reserved for concepts with insufficient information to code with codable children: Secondary | ICD-10-CM

## 2012-10-16 ENCOUNTER — Ambulatory Visit (HOSPITAL_COMMUNITY): Payer: Medicare Other

## 2012-10-16 ENCOUNTER — Other Ambulatory Visit: Payer: Self-pay

## 2012-10-16 NOTE — Telephone Encounter (Signed)
Hyperlipidemia - Wellington Hampshire, MD at 09/26/2011  2:02 PM    Status: Written Related Problem: Hyperlipidemia           Continue treatment with simvastatin. This is being followed by Dr. Wenda Overland. She had recent labs done. I recommend a target LDL of less than 70

## 2012-10-17 ENCOUNTER — Ambulatory Visit (HOSPITAL_COMMUNITY)
Admission: RE | Admit: 2012-10-17 | Discharge: 2012-10-17 | Disposition: A | Discharge status: Home / Self Care | Payer: Medicare Other | Source: Ambulatory Visit | Attending: Neurology | Admitting: Neurology

## 2012-10-17 DIAGNOSIS — G5710 Meralgia paresthetica, unspecified lower limb: Secondary | ICD-10-CM

## 2012-10-17 DIAGNOSIS — IMO0002 Reserved for concepts with insufficient information to code with codable children: Secondary | ICD-10-CM

## 2012-10-17 DIAGNOSIS — M545 Low back pain, unspecified: Secondary | ICD-10-CM | POA: Insufficient documentation

## 2012-10-17 DIAGNOSIS — M5137 Other intervertebral disc degeneration, lumbosacral region: Secondary | ICD-10-CM | POA: Insufficient documentation

## 2012-10-17 DIAGNOSIS — M51379 Other intervertebral disc degeneration, lumbosacral region without mention of lumbar back pain or lower extremity pain: Secondary | ICD-10-CM | POA: Insufficient documentation

## 2012-10-17 DIAGNOSIS — R439 Unspecified disturbances of smell and taste: Secondary | ICD-10-CM | POA: Insufficient documentation

## 2012-10-22 ENCOUNTER — Ambulatory Visit (INDEPENDENT_AMBULATORY_CARE_PROVIDER_SITE_OTHER): Payer: Medicare Other | Admitting: Psychiatry

## 2012-10-22 DIAGNOSIS — F329 Major depressive disorder, single episode, unspecified: Secondary | ICD-10-CM

## 2012-10-22 NOTE — Patient Instructions (Signed)
Discussed orally 

## 2012-10-22 NOTE — Progress Notes (Signed)
Patient:  Sherry Cox   DOB: 09-21-1960  MR Number: [id number]  Location: [place]:  [address] [place],  [place], [address]  Start: Wednesday 10/22/2012 10:00 AM End: Wednesday 10/22/2012 10:45 AM  Provider/Observer:     Maurice Small, MSW, LCSW   Chief Complaint:      Chief Complaint  Patient presents with  . Depression  . Anxiety    Reason For Service:     The patient seeks services due to history of  symptoms of anxiety and depression. She has severe physical health problems due to kidney disease and has been a dialysis patient since April 2006. Symptoms include excessive worrying, panic attacks, depressed mood, and increased emotionality. Patient is seen today for a follow up appointment.  Interventions Strategy:  Supportive therapy, cognitive behavioral therapy  Participation Level:   Active  Participation Quality:  Appropriate      Behavioral Observation:  Fairly Groomed and Appropriate,   Current Psychosocial Factors: The patient reports two co-dialysis patients died recently and ongoing concerns about her health  Content of Session:   Reviewing symptoms, processing feelings, identifying and challenging cognitive distortions, reviewing the panic cycle, reviewing relaxation techniques  Current Status:   Patient reports decreased panic attacks but continued anxiety.She reports her mood has been okay but experiencing increased sadness due to recent deaths of friends.     Patient Progress:   Good. Patient reports increased sadness related to the recent deaths of 2 co-dialysis' patients. Therapist works with patient to process her feelings as this has triggered increased thoughts about patient's mortality and concerns about her health. Patient has been experiencing increased numbness in her legs and currently is working with a neurologist. Patient continues to have panic attacks but reports improved ability to manage. Therapist works with patient to identify  and challenge cognitive distortions triggering panic attacks as well as review relaxation techniques and ways to intervene during the panic cycle.   Target Goals:   1. Decrease anxiety as evidenced by decreased intensity and frequency of panic attacks and decreased worry. 2. Improved ability to identify and challenge thinking errors  as evidenced by increased self-acceptance and setting realistic expectations of self regarding  her physical functioning.  Last Reviewed:   05/21/2011  Goals Addressed Today:    Decreasing anxiety  Impression/Diagnosis:   The patient has a long-standing history of symptoms of anxiety and depression that appear to have become more intense when she became a dialysis patient in 2006. Symptoms include panic attacks, anxiety, and excessive worry and along with depressed mood. Patient also has a number of other health issues that contribute to her anxiety and mood including thyroid disease. Diagnosis: Anxiety disorder NOS, depressive disorder NOS  Diagnosis:  Axis I:  Depressive disorder, not elsewhere classified          Axis II: No diagnosis

## 2012-10-23 ENCOUNTER — Other Ambulatory Visit: Payer: Self-pay | Admitting: Cardiology

## 2012-10-23 MED ORDER — SIMVASTATIN 40 MG PO TABS: 40.0000 mg | ORAL_TABLET | Freq: Every day | ORAL | Status: DC

## 2012-11-07 ENCOUNTER — Other Ambulatory Visit (HOSPITAL_COMMUNITY): Payer: Self-pay | Admitting: Psychiatry

## 2012-11-07 NOTE — Telephone Encounter (Signed)
Refill of Klonopin written.  Message left on answering machine that this was Dr Viona Gilmore and that someone with the initials of M E requested a refill and that the refill was written and available at the office.

## 2012-11-09 ENCOUNTER — Encounter: Payer: Self-pay | Admitting: Cardiology

## 2012-11-10 ENCOUNTER — Ambulatory Visit (INDEPENDENT_AMBULATORY_CARE_PROVIDER_SITE_OTHER): Payer: Medicare Other | Admitting: Cardiology

## 2012-11-10 ENCOUNTER — Encounter: Payer: Self-pay | Admitting: Cardiology

## 2012-11-10 VITALS — BP 109/58 | HR 66 | Ht <= 58 in | Wt 220.0 lb

## 2012-11-10 DIAGNOSIS — Z87891 Personal history of nicotine dependence: Secondary | ICD-10-CM

## 2012-11-10 DIAGNOSIS — I2510 Atherosclerotic heart disease of native coronary artery without angina pectoris: Secondary | ICD-10-CM

## 2012-11-10 DIAGNOSIS — F411 Generalized anxiety disorder: Secondary | ICD-10-CM

## 2012-11-10 DIAGNOSIS — N186 End stage renal disease: Secondary | ICD-10-CM

## 2012-11-10 DIAGNOSIS — Z992 Dependence on renal dialysis: Secondary | ICD-10-CM

## 2012-11-10 NOTE — Patient Instructions (Addendum)

## 2012-11-10 NOTE — Progress Notes (Signed)
HPI  Patient is seen today to followup coronary artery disease. I saw her last March, 2013. She is post bypass in the past. She has carotid disease that is followed very carefully by vascular surgeon. She is on dialysis. She has occasional chest discomfort that is usually related to anxiety. She can compose herself and feel better. Her last stress test was 2010. Her last echo was 2012 showing an ejection fraction in the normal range. There was no significant valvular disease at that time. She's not having any significant symptoms at this time.  No Known Allergies  Current Outpatient Prescriptions  Medication Sig Dispense Refill  . albuterol (PROAIR HFA) 108 (90 BASE) MCG/ACT inhaler Inhale 2 puffs into the lungs as needed.        Marland Kitchen aspirin 325 MG tablet Take 325 mg by mouth daily.        . clonazePAM (KLONOPIN) 0.5 MG tablet TAKE ONE TABLET BY MOUTH AT BEDTIME  24 tablet  0  . folic acid-vitamin b complex-vitamin c-selenium-zinc (DIALYVITE) 3 MG TABS Take 1 tablet by mouth daily.        Marland Kitchen gabapentin (NEURONTIN) 100 MG capsule Take 1-3 capsules (100-300 mg total) by mouth 3 (three) times daily. May take up to 3 at a time if needed.  150 capsule  2  . HYDROcodone-acetaminophen (VICODIN) 5-500 MG per tablet Take one tablet twice a day       . levothyroxine (SYNTHROID, LEVOTHROID) 150 MCG tablet Take 1 tablet by mouth Daily.      . metoprolol (LOPRESSOR) 50 MG tablet Take 1 tablet (50 mg total) by mouth 2 (two) times daily.  180 tablet  0  . nitroGLYCERIN (NITROSTAT) 0.4 MG SL tablet Place 1 tablet (0.4 mg total) under the tongue every 5 (five) minutes as needed for chest pain.  75 tablet  3  . sertraline (ZOLOFT) 100 MG tablet Take 1 tablet (100 mg total) by mouth daily.  90 tablet  0  . sevelamer (RENVELA) 800 MG tablet Take 5 tablets by mouth with meals and 3 tablets by mouth with snacks.       . simvastatin (ZOCOR) 40 MG tablet Take 1 tablet (40 mg total) by mouth at bedtime. PATIENT NEED  CHOLESTEROL LABS  30 tablet  2  . sodium bicarbonate 650 MG tablet Take 1 tablet by mouth two times a day.       . sulfacetamide (BLEPH-10) 10 % ophthalmic solution       . zolpidem (AMBIEN) 5 MG tablet Take 5 mg by mouth at bedtime as needed.         No current facility-administered medications for this visit.    History   Social History  . Marital Status: Married    Spouse Name: N/A    Number of Children: N/A  . Years of Education: N/A   Occupational History  . DISABLED    Social History Main Topics  . Smoking status: Former Smoker -- 0.30 packs/day for 30 years    Types: Cigarettes    Quit date: 04/26/2012  . Smokeless tobacco: Never Used     Comment: 1 cigg per day  . Alcohol Use: No  . Drug Use: No  . Sexually Active: No   Other Topics Concern  . Not on file   Social History Narrative  . No narrative on file    Family History  Problem Relation Age of Onset  . Depression Sister   . Dementia Sister   .  OCD Sister   . Diabetes Sister   . Hypertension Sister   . Heart attack Sister   . Bipolar disorder Brother   . Drug abuse Brother   . Diabetes Brother   . Hypertension Brother   . Bipolar disorder Other   . ADD / ADHD Other   . Seizures Other   . Bipolar disorder Other   . Alcohol abuse Other   . Dementia Mother   . Heart disease Mother   . Hypertension Mother   . Heart attack Mother   . Alcohol abuse Father   . Heart disease Father     Heart Disease before age 43  . Hypertension Father   . Heart attack Father   . Dementia Maternal Aunt   . Paranoid behavior Neg Hx   . Schizophrenia Neg Hx   . Sexual abuse Neg Hx   . Physical abuse Neg Hx   . OCD Sister   . Heart disease Sister     Heart Disease before age 50    Past Medical History  Diagnosis Date  . Overweight   . Anxiety disorder   . Carotid artery stenosis     12/10/2008 doppler 0000000 RICA, [id number] LICA/see evaluation by Dr. Oneida Alar 2011  . CAD (coronary artery disease)     Total RCA,  stress echo [facility] 11/2008-no ischemia, EF 55%; h/o CABG  . Hyperlipidemia   . ESRD on dialysis     Transplant consideration  . Tobacco abuse   . Hx of CABG     Off pump LIMA to LAD  05/2006  . Ejection fraction     EF 60-65%, echo, June, 2009  . Murmur     October, 2012  . Neuropathy   . Anxiety   . Hx of tobacco use, presenting hazards to health 04/26/2012  . Irregular heart beat   . Ankle injury     Past Surgical History  Procedure Laterality Date  . Coronary artery bypass graft  05/2006    Off pump LIMA to LAD  . Inner ear surgery    . Plastic surgery on leg    . Cardiac valve surgery  2007  . Cholecystectomy  1995    Gall Bladder    Patient Active Problem List   Diagnosis Date Noted  . Hx of tobacco use, presenting hazards to health     Priority: High  . Hx of CABG     Priority: High  . 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 Occlusion and stenosis of carotid artery without mention of cerebral infarction 08/30/2011  . Ejection fraction   . CAD (coronary artery disease)   . Carotid artery stenosis   . Hyperlipidemia   . ESRD on dialysis   . HYPOTHYROIDISM 09/26/2009  . OVERWEIGHT 06/09/2009  . ANXIETY 06/09/2009    ROS   Patient denies fever, chills, headache, sweats, rash, change in vision, change in hearing, chest pain, cough, nausea vomiting, urinary symptoms. All other systems are reviewed and are negative.  PHYSICAL EXAM  Patient is oriented to person time and place. Affect is normal. She is overweight. There is no jugulovenous distention. Lungs are clear. Respiratory effort is nonlabored. Cardiac exam reveals S1 and S2. There is a 2/6 crescendo decrescendo systolic murmur. The abdomen is soft. There's no peripheral edema.  Filed Vitals:   11/10/12 1344  BP: 109/58  Pulse: 66  Height: 4\' 10"  (1.473 m)  Weight: 220 lb (99.791 kg)   I reviewed her EKGs  in the computer. The last one was in December with diffuse nonspecific ST-T wave changes. EKG  was not done today.  ASSESSMENT & PLAN

## 2012-11-10 NOTE — Assessment & Plan Note (Signed)
The patient smokes a few cigarettes per week. She know she is always trying to stop.

## 2012-11-10 NOTE — Assessment & Plan Note (Signed)
Carotid disease is followed very carefully by [facility].

## 2012-11-10 NOTE — Assessment & Plan Note (Signed)
Anxiety plays a role with her symptoms. She gets help for this.

## 2012-11-10 NOTE — Assessment & Plan Note (Signed)
Coronary disease is stable. Her last stress echo was May, 2010. No further workup now.

## 2012-11-10 NOTE — Assessment & Plan Note (Signed)
Her dialysis is going relatively well. She is not considering transplant actively at this time. However she wants to talk again with vascular surgery when she sees him in followup.

## 2012-11-27 ENCOUNTER — Other Ambulatory Visit: Payer: Self-pay

## 2012-11-27 ENCOUNTER — Ambulatory Visit: Payer: Self-pay | Admitting: Neurosurgery

## 2012-11-28 ENCOUNTER — Encounter (HOSPITAL_COMMUNITY): Payer: Self-pay | Admitting: Psychiatry

## 2012-11-28 ENCOUNTER — Ambulatory Visit (INDEPENDENT_AMBULATORY_CARE_PROVIDER_SITE_OTHER): Payer: Medicare Other | Admitting: Psychiatry

## 2012-11-28 VITALS — BP 135/86 | Ht <= 58 in | Wt 209.6 lb

## 2012-11-28 DIAGNOSIS — F329 Major depressive disorder, single episode, unspecified: Secondary | ICD-10-CM

## 2012-11-28 DIAGNOSIS — F5105 Insomnia due to other mental disorder: Secondary | ICD-10-CM

## 2012-11-28 DIAGNOSIS — F411 Generalized anxiety disorder: Secondary | ICD-10-CM

## 2012-11-28 DIAGNOSIS — Z87891 Personal history of nicotine dependence: Secondary | ICD-10-CM

## 2012-11-28 DIAGNOSIS — E663 Overweight: Secondary | ICD-10-CM

## 2012-11-28 MED ORDER — CLONAZEPAM 0.5 MG PO TABS: ORAL_TABLET | ORAL | Status: DC

## 2012-11-28 MED ORDER — SERTRALINE HCL 100 MG PO TABS: 100.0000 mg | ORAL_TABLET | Freq: Every day | ORAL | Status: DC

## 2012-11-28 NOTE — Progress Notes (Signed)
[facility] [phone] Progress Note Sherry Cox MRN: [id number] DOB: 08-Aug-1960 Age: 52 y.o.  Date: 11/28/2012 Start Time: 10:15 AM End Time: 10:36 AM  Chief Complaint: Chief Complaint  Patient presents with  . Anxiety  . Follow-up  . Medication Refill   Subjective: "I'm having trouble walking.  The Neurontin caused my legs to swell and i couldn't take them.  My neurologist suggested that I only take 1/2 tab a day". Depression 2/10 and Anxiety 8/10, where 1 is the best and 10 is the worst.  Pain is 9/10 in the lower back region.   History of presenting illness Patient came for her followup appointment.  Pt reports that she is compliant with the psychotropic medications with good benefit and considerable side effects.  She had the above listed leg swelling from the Neurontin.  Current psychiatric medication Klonopin 0.5 mg at bedtime  Zoloft 100 mg every noon Neurontin 50 mg in the evenings Ambien 5 mg as needed prescribed by primary care physician  Past psychiatric history Patient has been seeing in this office since February 2007.  She had a renal failure in April 2006.  Patient denies any history of suicidal attempt or any inpatient psychiatric treatment.  Patient denies any history of mania or psychosis.  She is taking Zoloft and Klonopin since then and stable on her current medication. Allergies: No Known Allergies  Medical History: Past Medical History  Diagnosis Date  . Overweight   . Anxiety disorder   . Carotid artery stenosis     12/10/2008 doppler 0000000 RICA, [id number] LICA/see evaluation by Dr. Oneida Alar 2011  . CAD (coronary artery disease)     Total RCA, stress echo [facility] 11/2008-no ischemia, EF 55%; h/o CABG  . Hyperlipidemia   . ESRD on dialysis     Transplant consideration  . Tobacco abuse   . Hx of CABG     Off pump LIMA to LAD  05/2006  . Ejection fraction     EF 60-65%, echo, June, 2009  . Murmur     October, 2012  . Neuropathy   . Anxiety    . Hx of tobacco use, presenting hazards to health 04/26/2012  . Irregular heart beat   . Ankle injury    Surgical History: Past Surgical History  Procedure Laterality Date  . Coronary artery bypass graft  05/2006    Off pump LIMA to LAD  . Inner ear surgery    . Plastic surgery on leg    . Cardiac valve surgery  2007  . Cholecystectomy  1995    Gall Bladder   Family history Patient has a family history of psychiatric illness.   family history includes ADD / ADHD in her other; Alcohol abuse in her father and other; Bipolar disorder in her brother and others; Dementia in her maternal aunt, mother, and sister; Depression in her sister; Diabetes in her brother and sister; Drug abuse in her brother; Heart attack in her father, mother, and sister; Heart disease in her father, mother, and sister; Hypertension in her brother, father, mother, and sister; OCD in her sisters; and Seizures in her other.  There is no history of Paranoid behavior, and Schizophrenia, and Sexual abuse, and Physical abuse, . Reviewed during this visit and these remain the same  Psychosocial history Patient was born and raised in [place].  She has been married twice.  She has no children.  She lives with her sister and her son.    Alcohol and substance  use history Patient denies any history of alcohol and substance use.  Mental status examination Patient is casually dressed and fairly groomed. She is pleasant but tired .   she had dialysis today.  She maintained good eye contact.  Her speech is clear and fluent and coherent.  Her thought processes are logical linear and goal-directed.  She denies any auditory or visual hallucination. She denies any active or passive suicidal thinking and homicidal thinking. She described her mood is good.  Her attention and concentration is fair.  She is alert and oriented x3. Her insight judgment and impulse control is okay.  Lab Results: No results found for this or any  previous visit (from the past 8736 hour(s)). Gets labs drawn at dialysis.  She does not check her blood sugar.  Assessment Axis I depressive disorder NOS Axis II deferred Axis III see medical history Axis IV mild to moderate  Plan: I took her vitals.  I reviewed CC, tobacco/med/surg Hx, meds effects/ side effects, problem list, therapies and responses as well as current situation/symptoms discussed options. Increase the Klonopin to include an occasional day dose. Cont other meds See orders and pt instructions for more details.  MEDICATIONS this encounter: Meds ordered this encounter  Medications  . sertraline (ZOLOFT) 100 MG tablet    Sig: Take 1 tablet (100 mg total) by mouth daily.    Dispense:  90 tablet    Refill:  0    90 day supply  . clonazePAM (KLONOPIN) 0.5 MG tablet    Sig: TAKE ONE TABLET BY MOUTH AT BEDTIME, may take an occasional additional dose during the day.    Dispense:  40 tablet    Refill:  0    Medical Decision Making Problem Points:  Established problem, stable/improving (1), Review of last therapy session (1) and Review of psycho-social stressors (1) Data Points:  Review or order clinical lab tests (1) Review of medication regiment & side effects (2) Review of new medications or change in dosage (2)  I certify that outpatient services furnished can reasonably be expected to improve the patient's condition.   Rudean Curt, MD, [facility]

## 2012-11-28 NOTE — Patient Instructions (Addendum)
May take an occasional additional Klonopin a day, but there are only enough for 10 extra doses per month  Relaxation is the ultimate solution for you.  You can seek it through tub baths, bubble baths, essential oils or incense, walking or chatting with friends, listening to soft music, watching a candle burn and just letting all thoughts go and appreciating the true essence of the Creator.  Pets or animals may be very helpful.  You might spend some time with them and then go do more directed meditation.  "I am Wishes Fulfilled Meditation" by Darla Lesches and Huey Romans may be helpful MUSIC for getting to sleep or for meditating You can order it from on line.  You might find the Chill channel on Pandora and explore the artists that you like better.   Get back into creating a lot.  Take care of yourself.  No one else is standing up to do the job and only you know what you need.   GET SERIOUS about taking care of yourself.  Do the next right thing and that often means doing something to care for yourself along the lines of are you hungry, are you angry, are you lonely, are you tired, are you scared?  HALTS is what that stands for.  Call if problems or concerns.

## 2012-12-03 ENCOUNTER — Telehealth (HOSPITAL_COMMUNITY): Payer: Self-pay | Admitting: Psychiatry

## 2012-12-03 ENCOUNTER — Ambulatory Visit (HOSPITAL_COMMUNITY): Payer: Self-pay | Admitting: Psychiatry

## 2012-12-03 NOTE — Telephone Encounter (Signed)
Approved refill request via fax

## 2012-12-09 ENCOUNTER — Other Ambulatory Visit: Payer: Self-pay | Admitting: *Deleted

## 2012-12-09 MED ORDER — NITROGLYCERIN 0.4 MG SL SUBL: 0.4000 mg | SUBLINGUAL_TABLET | SUBLINGUAL | Status: DC | PRN

## 2012-12-15 ENCOUNTER — Ambulatory Visit (INDEPENDENT_AMBULATORY_CARE_PROVIDER_SITE_OTHER): Payer: Medicare Other | Admitting: Psychiatry

## 2012-12-15 DIAGNOSIS — F419 Anxiety disorder, unspecified: Secondary | ICD-10-CM

## 2012-12-15 DIAGNOSIS — F329 Major depressive disorder, single episode, unspecified: Secondary | ICD-10-CM

## 2012-12-15 DIAGNOSIS — F411 Generalized anxiety disorder: Secondary | ICD-10-CM

## 2012-12-15 NOTE — Patient Instructions (Signed)
Discussed orally 

## 2012-12-15 NOTE — Progress Notes (Signed)
Patient:  Sherry Cox   DOB: 07-Apr-1961  MR Number: [id number]  Location: [place]:  [address] [place],  [place], [address]  Start: Monday 12/15/2012 11:05 AM End: Monday 12/15/2012 11:55 AM  Provider/Observer:     Maurice Small, MSW, LCSW   Chief Complaint:      Chief Complaint  Patient presents with  . Anxiety    Reason For Service:     The patient seeks services due to history of  symptoms of anxiety and depression. She has severe physical health problems due to kidney disease and has been a dialysis patient since April 2006. Symptoms include excessive worrying, panic attacks, depressed mood, and increased emotionality. Patient is seen today for a follow up appointment.  Interventions Strategy:  Supportive therapy, cognitive behavioral therapy  Participation Level:   Active  Participation Quality:  Appropriate      Behavioral Observation:  Fairly Groomed and Appropriate,   Current Psychosocial Factors: The patient reports  family members are experiencing increased health issues  Content of Session:   Reviewing symptoms, processing feelings, identifying ways to increase involvement in activity, reviewing relaxation techniques  Current Status:   Patient reports decreased panic attacks but continued anxiety.She reports her mood has been okay. Patient rates anxiety as a 5 or 6 and depression at a 4 on a 10 point scale with one being none 10 being severe.      Patient Progress:   Good. Patient reports increased stress due to to family members experiencing increased health issues. She expresses frustration as she was unable to help family. Panic attacks now occur 2-3 times per week. However, she reports a very intense panic attack a couple of weeks ago but cannot identify any triggers. She remains jittery and nervous at times and her hands are shaking in session today. Therapist works with patient to review relaxation techniques patient is hopeful about strengthening  her legs as she has begun physical therapy as recommended by her neurologist. She has been informed that a pinched nerve is the source of numbness in her legs. Therapist works with patient to identify ways to resume interest and involvement and previously enjoyed activities.   Target Goals:   1. Decrease anxiety as evidenced by decreased intensity and frequency of panic attacks and decreased worry. 2. Improved ability to identify and challenge thinking errors  as evidenced by increased self-acceptance and setting realistic expectations of self regarding  her physical functioning.  Last Reviewed:   05/21/2011  Goals Addressed Today:    Goals 1 and 2  Impression/Diagnosis:   The patient has a long-standing history of symptoms of anxiety and depression that appear to have become more intense when she became a dialysis patient in 2006. Symptoms include panic attacks, anxiety, and excessive worry and along with depressed mood. Patient also has a number of other health issues that contribute to her anxiety and mood including thyroid disease. Diagnosis: Anxiety disorder NOS, depressive disorder NOS  Diagnosis:  Axis I:  Depressive disorder, not elsewhere classified  Anxiety disorder          Axis II: No diagnosis

## 2012-12-29 ENCOUNTER — Ambulatory Visit (HOSPITAL_COMMUNITY): Payer: Medicare Other | Admitting: Psychiatry

## 2013-01-05 ENCOUNTER — Ambulatory Visit (HOSPITAL_COMMUNITY): Payer: Medicare Other | Admitting: Psychiatry

## 2013-01-08 ENCOUNTER — Ambulatory Visit (HOSPITAL_COMMUNITY): Payer: Medicare Other | Admitting: Psychiatry

## 2013-01-08 ENCOUNTER — Ambulatory Visit (INDEPENDENT_AMBULATORY_CARE_PROVIDER_SITE_OTHER): Payer: Medicare Other | Admitting: Psychiatry

## 2013-01-08 ENCOUNTER — Encounter (HOSPITAL_COMMUNITY): Payer: Self-pay | Admitting: Psychiatry

## 2013-01-08 VITALS — Wt 212.0 lb

## 2013-01-08 DIAGNOSIS — F329 Major depressive disorder, single episode, unspecified: Secondary | ICD-10-CM

## 2013-01-08 MED ORDER — SERTRALINE HCL 100 MG PO TABS: 100.0000 mg | ORAL_TABLET | Freq: Every day | ORAL | Status: DC

## 2013-01-08 MED ORDER — CLONAZEPAM 0.5 MG PO TABS: ORAL_TABLET | ORAL | Status: DC

## 2013-01-08 NOTE — Progress Notes (Signed)
[place] [id number] Progress Note Sherry Cox MRN: [id number]  Chief Complaint: Chief Complaint  Patient presents with  . Follow-up  . Medication Refill   Subjective: I did not get Klonopin.  I'm having anxiety and insomnia.  History of presenting illness Patient is 52 year old Caucasian female who came for her followup appointment.  She's compliant with her Zoloft however she is not taking Klonopin.  Patient told she had tried calling pharmacy a few times but there were no refills at pharmacy.  She admitted for sleep and anxiety symptoms.  She appears tired because she is coming from dialysis .  She likes taking Zoloft.  She denies any crying spells or any depressive thoughts.  She denies any tremors or shakes.  She's taking gabapentin 50 mg in the evening.  She rarely takes Ambien.  Current psychiatric medication Klonopin 0.5 mg at bedtime  Zoloft 100 mg every noon Neurontin 50 mg in the evenings Ambien 5 mg as needed prescribed by primary care physician  Past psychiatric history Patient has been seeing in this office since February 2007.  She had a renal failure in April 2006.  Patient denies any history of suicidal attempt or any inpatient psychiatric treatment.  Patient denies any history of mania or psychosis.  She is taking Zoloft and Klonopin since then and stable on her current medication. Allergies: No Known Allergies  Medical History: Past Medical History  Diagnosis Date  . Overweight(278.02)   . Anxiety disorder   . Carotid artery stenosis     12/10/2008 doppler 0000000 RICA, [id number] LICA/see evaluation by Dr. Oneida Alar 2011  . CAD (coronary artery disease)     Total RCA, stress echo [facility] 11/2008-no ischemia, EF 55%; h/o CABG  . Hyperlipidemia   . ESRD on dialysis     Transplant consideration  . Tobacco abuse   . Hx of CABG     Off pump LIMA to LAD  05/2006  . Ejection fraction     EF 60-65%, echo, June, 2009  . Murmur     October, 2012  . Neuropathy    . Anxiety   . Hx of tobacco use, presenting hazards to health 04/26/2012  . Irregular heart beat   . Ankle injury    Surgical History: Past Surgical History  Procedure Laterality Date  . Coronary artery bypass graft  05/2006    Off pump LIMA to LAD  . Inner ear surgery    . Plastic surgery on leg    . Cardiac valve surgery  2007  . Cholecystectomy  1995    Gall Bladder   Family history Patient has a family history of psychiatric illness.   family history includes ADD / ADHD in her other; Alcohol abuse in her father and other; Bipolar disorder in her brother and others; Dementia in her maternal aunt, mother, and sister; Depression in her sister; Diabetes in her brother and sister; Drug abuse in her brother; Heart attack in her father, mother, and sister; Heart disease in her father, mother, and sister; Hypertension in her brother, father, mother, and sister; OCD in her sisters; and Seizures in her other.  There is no history of Paranoid behavior, and Schizophrenia, and Sexual abuse, and Physical abuse, . Reviewed during this visit and these remain the same  Psychosocial history Patient was born and raised in [place].  She has been married twice.  She has no children.  She lives with her sister and her son.    Alcohol and substance use  history Patient denies any history of alcohol and substance use.  Mental status examination Patient is casually dressed and fairly groomed. She is tired.  She maintained good eye contact.  Her speech is soft clear and coherent.  Her speech is clear and fluent and coherent.  Her thought processes are logical linear and goal-directed.  She denies any auditory or visual hallucination. She denies any active or passive suicidal thinking and homicidal thinking. She described her mood is good.  Her attention and concentration is fair.  She is alert and oriented x3. Her insight judgment and impulse control is okay.  Lab Results: No results found for this  or any previous visit (from the past 8736 hour(s)). Gets labs drawn at dialysis.  She does not check her blood sugar.  Assessment Axis I depressive disorder NOS Axis II deferred Axis III see medical history Axis IV mild to moderate  Plan: We will refill her Klonopin 0.5 mg daily and many second dose if needed.  A new prescription of Klonopin is given with 2 extra refill.  Continue Zoloft at present dose.  Recommend to call us back if she is any question or concern if she feels worsening of the symptom.  I will see her again in 3 months.  MEDICATIONS this encounter: Meds ordered this encounter  Medications  . gabapentin (NEURONTIN) 100 MG capsule    Sig: Take 100 mg by mouth daily.  . sertraline (ZOLOFT) 100 MG tablet    Sig: Take 1 tablet (100 mg total) by mouth daily.    Dispense:  90 tablet    Refill:  0    90 day supply  . clonazePAM (KLONOPIN) 0.5 MG tablet    Sig: TAKE ONE TABLET BY MOUTH AT BEDTIME, may take an occasional additional dose during the day.    Dispense:  40 tablet    Refill:  2    Medical Decision Making Problem Points:  Established problem, stable/improving (1), Review of last therapy session (1) and Review of psycho-social stressors (1) Data Points:  Review of medication regiment & side effects (2)  I certify that outpatient services furnished can reasonably be expected to improve the patient's condition.   Kayley Zeiders T., MD

## 2013-01-12 ENCOUNTER — Ambulatory Visit (HOSPITAL_COMMUNITY): Payer: Self-pay | Admitting: Psychiatry

## 2013-01-13 ENCOUNTER — Other Ambulatory Visit: Payer: Self-pay

## 2013-01-13 MED ORDER — SIMVASTATIN 40 MG PO TABS: 40.0000 mg | ORAL_TABLET | Freq: Every day | ORAL | Status: DC

## 2013-01-21 ENCOUNTER — Ambulatory Visit (INDEPENDENT_AMBULATORY_CARE_PROVIDER_SITE_OTHER): Payer: Medicare Other | Admitting: Psychiatry

## 2013-01-21 DIAGNOSIS — F329 Major depressive disorder, single episode, unspecified: Secondary | ICD-10-CM

## 2013-01-21 NOTE — Progress Notes (Addendum)
Patient:  Sherry Cox   DOB: November 01, 1960  MR Number: [id number]  Location: [facility]:  [address] [place],  [place], [address]  Start: Wednesday 01/21/2013 10:05 AM End: Wednesday 01/21/2013 10:55 AM  Provider/Observer:     Maurice Small, MSW, LCSW   Chief Complaint:      Chief Complaint  Patient presents with  . Anxiety  . Depression    Reason For Service:     The patient seeks services due to history of  symptoms of anxiety and depression. She has severe physical health problems due to kidney disease and has been a dialysis patient since April 2006. Symptoms include excessive worrying, panic attacks, depressed mood, and increased emotionality. Patient is seen today for a follow up appointment.  Interventions Strategy:  Supportive therapy, cognitive behavioral therapy  Participation Level:   Active  Participation Quality:  Appropriate      Behavioral Observation:  Fairly Groomed and Appropriate,   Current Psychosocial Factors: The patient reports  family members have continued to experience increased health issues.   Content of Session:   Reviewing symptoms, processing feelings, identifying and challenging thinking errors, reviewing relaxation techniques  Current Status:   Patient reports increased panic attacks and anxiety along with increased depression but beginning to experience improved mood in the past few days. Patient rates anxiety as 0 and depression at a 3 on a 10 point scale with one being none 10 being severe.      Patient Progress:   Good. Patient reports increased stress due to to family members continuing to experience increased health issues during June. She reports using her coping techniques but still experiencing difficulty as she did not have any Klonopin for the month of June. She has resumed taking medication and is feeling better. Therapist works with patient to explore her thought patterns and effects on her mood and behavior as well as  ways to intervene. Therapist also works with patient to review relaxation techniques. Patient has begun to resume interest in previously enjoyed activities including playing her piano and attending church. Patient continues to struggle with health issues related to renal failure and being on dialysis. She also expresses disappointment she was not approved to attend physical therapy by her Medicaid. Therapist and patient discuss the possibility of patient contacted the [facility] regarding ascholarship and participating in activity within her capability. Patient also plans to do exercises she learned in physical therapy.    Target Goals:   1. Decrease anxiety as evidenced by decreased intensity and frequency of panic attacks and decreased worry. 2. Improved ability to identify and challenge thinking errors  as evidenced by increased self-acceptance and setting realistic expectations of self regarding  her physical functioning.  Last Reviewed:   05/21/2011  Goals Addressed Today:    Goals 1 and 2  Impression/Diagnosis:   The patient has a long-standing history of symptoms of anxiety and depression that appear to have become more intense when she became a dialysis patient in 2006. Symptoms include panic attacks, anxiety, and excessive worry and along with depressed mood. Patient also has a number of other health issues that contribute to her anxiety and mood including thyroid disease. Diagnosis: Anxiety disorder NOS, depressive disorder NOS  Diagnosis:  Axis I:  Depressive disorder, not elsewhere classified          Axis II: No diagnosis

## 2013-01-21 NOTE — Patient Instructions (Signed)
Discussed orally 

## 2013-02-25 ENCOUNTER — Other Ambulatory Visit: Payer: Self-pay | Admitting: Internal Medicine

## 2013-02-25 ENCOUNTER — Encounter: Payer: Self-pay | Admitting: Vascular Surgery

## 2013-02-25 ENCOUNTER — Ambulatory Visit (INDEPENDENT_AMBULATORY_CARE_PROVIDER_SITE_OTHER): Payer: Medicare Other | Admitting: Psychiatry

## 2013-02-25 DIAGNOSIS — F3289 Other specified depressive episodes: Secondary | ICD-10-CM

## 2013-02-25 DIAGNOSIS — F329 Major depressive disorder, single episode, unspecified: Secondary | ICD-10-CM

## 2013-02-25 NOTE — Progress Notes (Signed)
Patient:  Sherry Cox   DOB: September 21, 1960  MR Number: [id number]  Location: [place]:  [address] [place],  [place], [address]  Start: Wednesday 02/25/2013 10:05 AM End: Wednesday 02/25/2013 10:55 AM  Provider/Observer:     Maurice Small, MSW, LCSW   Chief Complaint:      Chief Complaint  Patient presents with  . Anxiety  . Depression    Reason For Service:     The patient seeks services due to history of  symptoms of anxiety and depression. She has severe physical health problems due to kidney disease and has been a dialysis patient since April 2006. Symptoms include excessive worrying, panic attacks, depressed mood, and increased emotionality. Patient is seen today for a follow up appointment.  Interventions Strategy:  Supportive therapy, cognitive behavioral therapy  Participation Level:   Active  Participation Quality:  Appropriate      Behavioral Observation:  Fairly Groomed and Appropriate,   Current Psychosocial Factors: The patient reports  family members have continued to experience increased health issues. She also reports her sister-in-law recently died.  Content of Session:   Reviewing symptoms, processing feelings, identifying ways to reframe negative thinking, relaxation techniques  Current Status:   Patient reports continued panic attacks and anxiety at the beginning of the month due to to several family situations and illnesses.     Patient Progress:    Patient reports continued stress due to to family members continuing to experience increased health issues during July and August. Patient has been using her coping skills including relaxation breathing along with increasing physical activity. She reports recently going swimming. She also has used her support system. Therapist works with patient to process her feelings and to identify her thought patterns. Therapist works with patient to identify ways to reduce negative thought patterns and increased  positive thought patterns including using a gratitude journal. Therapist also works with patient to practice a progressive muscle relaxation exercise.    Target Goals:   1. Decrease anxiety as evidenced by decreased intensity and frequency of panic attacks and decreased worry. 2. Improved ability to identify and challenge thinking errors  as evidenced by increased self-acceptance and setting realistic expectations of self regarding  her physical functioning.  Last Reviewed:   05/21/2011  Goals Addressed Today:    Goals 1 and 2  Impression/Diagnosis:   The patient has a long-standing history of symptoms of anxiety and depression that appear to have become more intense when she became a dialysis patient in 2006. Symptoms include panic attacks, anxiety, and excessive worry and along with depressed mood. Patient also has a number of other health issues that contribute to her anxiety and mood including thyroid disease. Diagnosis: Anxiety disorder NOS, depressive disorder NOS  Diagnosis:  Axis I:  Depressive disorder, not elsewhere classified          Axis II: No diagnosis

## 2013-02-25 NOTE — Patient Instructions (Signed)
Discussed orally 

## 2013-02-26 ENCOUNTER — Encounter: Payer: Self-pay | Admitting: Vascular Surgery

## 2013-02-26 ENCOUNTER — Other Ambulatory Visit (INDEPENDENT_AMBULATORY_CARE_PROVIDER_SITE_OTHER): Payer: Medicare Other | Admitting: Vascular Surgery

## 2013-02-26 ENCOUNTER — Ambulatory Visit (INDEPENDENT_AMBULATORY_CARE_PROVIDER_SITE_OTHER): Payer: Medicare Other | Admitting: Vascular Surgery

## 2013-02-26 DIAGNOSIS — Z48812 Encounter for surgical aftercare following surgery on the circulatory system: Secondary | ICD-10-CM

## 2013-02-26 DIAGNOSIS — I6529 Occlusion and stenosis of unspecified carotid artery: Secondary | ICD-10-CM

## 2013-02-26 NOTE — Progress Notes (Signed)
[facility] [place]  Established Carotid Patient  History of Present Illness  Sherry Cox is a 52 y.o. (Oct 07, 1960) female who presents for her 6 month carotid stenosis surveillance.     Previous carotid studies demonstrated: RICA 123456 stenosis, LICA [id number] stenosis.  Patient has no history of TIA or stroke symptom.  The patient has not had amaurosis fugax or monocular blindness.  The patient has not had facial drooping or hemiplegia.  The patient has not had receptive or expressive aphasia.  She is on hemodialysis Tuesday Thursday and Saturday. Other medical conditions include CAD and takes Asprin 325 mg daily, hypertension treated with lopressor and hypercholesterolemia Zocor. These are currently stable. She is considering bilateral nephrectomy and possible kidney transplant.   The patient's PMH, PSH, SH, FamHx, Med, and Allergies are unchanged from 08/2012.  On ROS today: Review of Systems  Constitutional: Negative for fever and chills.  HENT: Negative for hearing loss and neck pain.   Eyes: Negative for blurred vision.  Respiratory: Negative for cough and hemoptysis.   Cardiovascular: Negative for chest pain and palpitations.  Gastrointestinal: Negative for heartburn, nausea and vomiting.  Genitourinary: Negative for dysuria.  Skin: Negative for itching and rash.  Neurological: Negative for dizziness, sensory change, focal weakness, weakness and headaches.  Psychiatric/Behavioral: Negative for depression.      Physical Examination  Filed Vitals:   02/26/13 1444  BP: 140/56  Pulse: 76  Height: 4' 9.5" (1.461 m)  Weight: 216 lb 4.8 oz (98.113 kg)  SpO2: 100%   Body mass index is 45.96 kg/(m^2).  General: A&O x 3, WD,+ Obese,  Eyes: PERRLA, EOMI,  Neck: Supple, neg nuchal rigidity,   Pulmonary: Sym exp, good air movt, CTAB, no rales, rhonchi, & wheezing,   Cardiac: RRR, Nl S1, S2, no Murmurs, rubs or gallops,   Vascular: Palpable femoral pulses  bil, palpable radial pulses. No carotid bruits . Palpable left upper arm thrill fistula Gastrointestinal: soft, NTTP  Musculoskeletal: M/S 5/5 throughout, Extremities without ischemic changes  Neurologic: CN 2-12 intact , Pain and light touch intact in extremitiesMotor exam as listed above  Non-Invasive Vascular Imaging  CAROTID DUPLEX (Date: 02/26/2013):   R ICA stenosis: <60%   L ICA stenosis: <60%   Medical Decision Making  Sherry Cox is a 52 y.o. female who presents with: Bilateral carotid stenosis.     Based on the patient's vascular studies and examination, Dr. Oneida Alar recommended a carotid angiogram.  She is planning to be on the kidney transplant list and this would involve major surgery.   [facility] Office: [place], [place] PA-C  02/26/2013, 3:31 PM  History and exam details as above. The patient has had discrepant carotid duplex exams in the past. She is considering a major intra-abdominal operation either bilateral nephrectomy kidney transplant or both. I believe the best course of action would be a carotid angiogram to further define her level of carotid stenosis to make sure that this is not significant before she has major abdominal surgery. Currently her carotid stenosis is asymptomatic. Risks benefits possible complications and procedure details the carotid angiogram were explained to the patient today. She understands and agrees to proceed. This is scheduled for 03/06/2013.  Ruta Hinds, MD [facility] [place] Office: [phone] Pager: [phone]

## 2013-02-27 ENCOUNTER — Other Ambulatory Visit: Payer: Self-pay

## 2013-02-27 NOTE — Addendum Note (Signed)
Addended by: Dorthula Rue L on: 02/27/2013 09:49 AM   Modules accepted: Orders

## 2013-03-03 ENCOUNTER — Encounter (HOSPITAL_COMMUNITY): Payer: Self-pay | Admitting: Pharmacy Technician

## 2013-03-06 ENCOUNTER — Encounter (HOSPITAL_COMMUNITY)
Admission: RE | Disposition: A | Discharge status: Home / Self Care | Payer: Self-pay | Source: Ambulatory Visit | Attending: Vascular Surgery

## 2013-03-06 ENCOUNTER — Telehealth: Payer: Self-pay | Admitting: Vascular Surgery

## 2013-03-06 ENCOUNTER — Other Ambulatory Visit: Payer: Self-pay

## 2013-03-06 ENCOUNTER — Ambulatory Visit (HOSPITAL_COMMUNITY)
Admission: RE | Admit: 2013-03-06 | Discharge: 2013-03-06 | Disposition: A | Discharge status: Home / Self Care | Payer: Medicare Other | Source: Ambulatory Visit | Attending: Vascular Surgery | Admitting: Vascular Surgery

## 2013-03-06 DIAGNOSIS — E7800 Pure hypercholesterolemia, unspecified: Secondary | ICD-10-CM | POA: Insufficient documentation

## 2013-03-06 DIAGNOSIS — I10 Essential (primary) hypertension: Secondary | ICD-10-CM | POA: Insufficient documentation

## 2013-03-06 DIAGNOSIS — Z6841 Body Mass Index (BMI) 40.0 and over, adult: Secondary | ICD-10-CM | POA: Insufficient documentation

## 2013-03-06 DIAGNOSIS — I6529 Occlusion and stenosis of unspecified carotid artery: Secondary | ICD-10-CM

## 2013-03-06 DIAGNOSIS — Z79899 Other long term (current) drug therapy: Secondary | ICD-10-CM | POA: Insufficient documentation

## 2013-03-06 DIAGNOSIS — E669 Obesity, unspecified: Secondary | ICD-10-CM | POA: Insufficient documentation

## 2013-03-06 DIAGNOSIS — I2510 Atherosclerotic heart disease of native coronary artery without angina pectoris: Secondary | ICD-10-CM | POA: Insufficient documentation

## 2013-03-06 DIAGNOSIS — I658 Occlusion and stenosis of other precerebral arteries: Secondary | ICD-10-CM | POA: Insufficient documentation

## 2013-03-06 DIAGNOSIS — Z7982 Long term (current) use of aspirin: Secondary | ICD-10-CM | POA: Insufficient documentation

## 2013-03-06 HISTORY — PX: CAROTID ANGIOGRAM: SHX5504

## 2013-03-06 LAB — POCT I-STAT, CHEM 8
BUN: 15 mg/dL (ref 6–23)
Creatinine, Ser: 4.2 mg/dL — ABNORMAL HIGH (ref 0.50–1.10)

## 2013-03-06 SURGERY — CAROTID ANGIOGRAM
Anesthesia: LOCAL | Laterality: Bilateral

## 2013-03-06 MED ORDER — ONDANSETRON HCL 4 MG/2ML IJ SOLN
INTRAMUSCULAR | Status: AC
  Filled 2013-03-06: qty 2

## 2013-03-06 MED ORDER — SODIUM CHLORIDE 0.9 % IJ SOLN: 3.0000 mL | INTRAMUSCULAR | Status: DC | PRN

## 2013-03-06 MED ORDER — HEPARIN (PORCINE) IN NACL 2-0.9 UNIT/ML-% IJ SOLN
INTRAMUSCULAR | Status: AC
  Filled 2013-03-06: qty 1000

## 2013-03-06 MED ORDER — LIDOCAINE HCL (PF) 1 % IJ SOLN
INTRAMUSCULAR | Status: AC
  Filled 2013-03-06: qty 30

## 2013-03-06 NOTE — Op Note (Signed)
Procedure: Arch aortogram and bilateral carotid angiogram Preoperative diagnosis: Asymptomatic internal carotid artery stenosis  Postopertive diagnosis: Same  Anesthesia: Local  Operative details: After obtaining informed consent, the patient was brought to the [facility] lab. The patient was placed in supine position on the Angio table. Both groins were prepped and draped in the usual sterile fashion. Local anesthesia was infiltrated over the right common femoral artery. Ultrasound was used to identify the right common femoral artery and the femoral bifurcation. A small nick was made in the groin with an 11 blade. The tract was dilated up with a hemostat. An introducer needle was then used to cannulate the right common femoral artery. An 55 Versacore wire was then threaded up into the abdominal aorta under fluoroscopic guidance. Next a 5 French sheath was placed over the guidewire into the right common femoral artery. This was thoroughly flushed with heparinized saline. A 5 French pigtail catheter was then placed over the guidewire and these were advanced as a unit up into the descending aorta. The guidewire was removed and the pigtail catheter connected to the power injector. Arch aortogram was obtained. This shows a patent innominate and left common and left subclavian artery. The vertebral arteries are antegrade bilaterally. They are patent. There is a left internal mammary graft which is patent to the heart. At this point the pigtail catheter was removed over guidewire and exchanged for a Berenstein 2 catheter. This was advanced over the guidewire selectively catheterize the right common carotid artery. Initially the catheter passed into the right subclavian artery. This was confirmed with angiography. Again the right vertebral artery is widely patent at its origin. The catheter was pulled back slightly and the right common carotid engaged. Right common carotid injection was then performed. Intracranial views  were performed in the AP and lateral projection for interpretation by neuroradiology. AP and lateral projection of the carotid bifurcation was also performed. There is no significant internal carotid artery stenosis on the right side. The internal and external carotid arteries are widely patent. The Berenstein catheter was then pulled back over the guidewire to engage the left common carotid artery. I was able to engage the artery and advanced a guidewire but due to tortuosity of the arch it was difficult to advance the catheter into the left common carotid artery. Therefore this was removed over the guidewire and exchanged for a Simmons catheter. This was then formed up in the aortic arch. Due to the close proximity of the innominate and left common carotid artery it took a few minutes to actually engage the left common carotid artery but I was able to advance the catheter into the left common carotid artery and left common carotid injection was performed. Intracranial views were also performed in AP and lateral projection for interpretation by neuroradiology. The AP and lateral carotid bifurcation views showed a 50% stenosis of the left internal carotid artery. There is also a fairly acute take off of the artery and this comes off at almost a 90 angle. The left external carotid artery is widely patent. At this point the [facility] catheter was removed over a guidewire. The 5 French pigtail catheter was advanced back up in the aortic arch and a magnified view of the arch was obtained to make sure that there was no left common carotid origin stenosis. The left common carotid origin is widely patent.  At this point all guidewires and catheters were removed. A 5 French sheath was left in place in the right common femoral  artery to be pulled in the holding area. The patient tolerated the procedure well and there were no complications. The patient was taken to the holding area in stable condition.  Operative findings:  #1 normal aortic arch anatomy with no great vessels stenosis #2 50% left internal carotid artery stenosis #3 no significant right internal carotid artery stenosis  Ruta Hinds, MD [facility] [employer] Office: [phone] Pager: [phone]

## 2013-03-06 NOTE — Interval H&P Note (Signed)
History and Physical Interval Note:  03/06/2013 8:08 AM  Sherry Cox  has presented today for surgery, with the diagnosis of Carotid stenosis  The various methods of treatment have been discussed with the patient and family. After consideration of risks, benefits and other options for treatment, the patient has consented to  Procedure(s): CAROTID ANGIOGRAM (N/A) as a surgical intervention .  The patient's history has been reviewed, patient examined, no change in status, stable for surgery.  I have reviewed the patient's chart and labs.  Questions were answered to the patient's satisfaction.     Geniece Akers E

## 2013-03-06 NOTE — Telephone Encounter (Addendum)
Message copied by Gena Fray on Fri Mar 06, 2013  1:46 PM ------      Message from: Denman George      Created: Fri Mar 06, 2013 10:51 AM                   ----- Message -----         From: Elam Dutch, MD         Sent: 03/06/2013  10:22 AM           To: Patrici Ranks, Alfonso Patten, RN, #            ARch and biltat carotid angio      2nd order right carotid      2nd order right subclavian      First order left carotid            50% left internal      0% right internal            She needs follow up duplex and [facility] appt in 1 year            Charles ------  03/06/2013: left detailed message for patient, dpm

## 2013-03-06 NOTE — H&P (View-Only) (Signed)
[facility] [place]  Established Carotid Patient  History of Present Illness  Sherry Cox is a 52 y.o. (April 29, 1961) female who presents for her 6 month carotid stenosis surveillance.     Previous carotid studies demonstrated: RICA 123456 stenosis, LICA [id number] stenosis.  Patient has no history of TIA or stroke symptom.  The patient has not had amaurosis fugax or monocular blindness.  The patient has not had facial drooping or hemiplegia.  The patient has not had receptive or expressive aphasia.  She is on hemodialysis Tuesday Thursday and Saturday. Other medical conditions include CAD and takes Asprin 325 mg daily, hypertension treated with lopressor and hypercholesterolemia Zocor. These are currently stable. She is considering bilateral nephrectomy and possible kidney transplant.   The patient's PMH, PSH, SH, FamHx, Med, and Allergies are unchanged from 08/2012.  On ROS today: Review of Systems  Constitutional: Negative for fever and chills.  HENT: Negative for hearing loss and neck pain.   Eyes: Negative for blurred vision.  Respiratory: Negative for cough and hemoptysis.   Cardiovascular: Negative for chest pain and palpitations.  Gastrointestinal: Negative for heartburn, nausea and vomiting.  Genitourinary: Negative for dysuria.  Skin: Negative for itching and rash.  Neurological: Negative for dizziness, sensory change, focal weakness, weakness and headaches.  Psychiatric/Behavioral: Negative for depression.      Physical Examination  Filed Vitals:   02/26/13 1444  BP: 140/56  Pulse: 76  Height: 4' 9.5" (1.461 m)  Weight: 216 lb 4.8 oz (98.113 kg)  SpO2: 100%   Body mass index is 45.96 kg/(m^2).  General: A&O x 3, WD,+ Obese,  Eyes: PERRLA, EOMI,  Neck: Supple, neg nuchal rigidity,   Pulmonary: Sym exp, good air movt, CTAB, no rales, rhonchi, & wheezing,   Cardiac: RRR, Nl S1, S2, no Murmurs, rubs or gallops,   Vascular: Palpable femoral pulses  bil, palpable radial pulses. No carotid bruits . Palpable left upper arm thrill fistula Gastrointestinal: soft, NTTP  Musculoskeletal: M/S 5/5 throughout, Extremities without ischemic changes  Neurologic: CN 2-12 intact , Pain and light touch intact in extremitiesMotor exam as listed above  Non-Invasive Vascular Imaging  CAROTID DUPLEX (Date: 02/26/2013):   R ICA stenosis: <60%   L ICA stenosis: <60%   Medical Decision Making  Sherry Cox is a 52 y.o. female who presents with: Bilateral carotid stenosis.     Based on the patient's vascular studies and examination, Dr. Oneida Alar recommended a carotid angiogram.  She is planning to be on the kidney transplant list and this would involve major surgery.   [facility] Office: [place], [place] PA-C  02/26/2013, 3:31 PM  History and exam details as above. The patient has had discrepant carotid duplex exams in the past. She is considering a major intra-abdominal operation either bilateral nephrectomy kidney transplant or both. I believe the best course of action would be a carotid angiogram to further define her level of carotid stenosis to make sure that this is not significant before she has major abdominal surgery. Currently her carotid stenosis is asymptomatic. Risks benefits possible complications and procedure details the carotid angiogram were explained to the patient today. She understands and agrees to proceed. This is scheduled for 03/06/2013.  Ruta Hinds, MD [facility] [place] Office: [phone] Pager: [phone]

## 2013-03-11 NOTE — Consult Note (Signed)
NAME:  Sherry Cox, Sherry Cox            ACCOUNT NO.:  [id number]  MEDICAL RECORD NO.:  [id number]  LOCATION:  [facility]                         FACILITY:  [facility]  PHYSICIAN:  Tiya Schrupp K. Lesleyann Fichter, M.D.DATE OF BIRTH:  [birth year]  DATE OF CONSULTATION: DATE OF DISCHARGE:  03/06/2013                                CONSULTATION   CLINICAL HISTORY:  The patient with progressive carotid stenosis.  EXAMINATION:  Intracranial interpretation of bilateral common carotid arteriograms and right vertebral artery angiogram.  FINDINGS:  The right vertebral artery at the cranial skull base is widely patent.  The distal segment of this vessel is not included on this study.  The right common carotid arteriogram demonstrates a right internal carotid artery and its distal cervical petrous and cavernous segments to be normal.  Wide patency is seen of the right middle and a right anterior cerebral arteries into the capillary and venous phases.  There was prompt cross opacification via the anterior communicating artery of the left middle cerebral artery and left anterior cerebral artery into the capillary and venous phases.  Antegrade flow in the proximal left middle cerebral artery is seen from the left internal carotid artery.  The left common carotid arteriogram demonstrates a distal cervical and the proximal petrous segments of the left internal carotid artery to be normal.  There is a suggestion of a significant approximately 70% plus stenosis of the left internal carotid artery at the petrous cavernous junction, however, distal to this, there is a tandem 50% stenosis of the supraclinoid left ICA.  Opacification seen into the left middle cerebral artery, into the capillary and venous phases.  Mixing of unopacified blood in the left middle cerebral artery proximally seen as described above.  IMPRESSION: 1. Approximately 70% plus stenosis of the left internal carotid artery     at the petrous  cavernous junction, with a     tandem stenosis of approximately 50% in the supraclinoid left     internal carotid artery. 2. Cross filling of the left middle and a left anterior circulation     via the anterior communicating artery from the right internal     carotid artery via the anterior communicating artery.          ______________________________ Fritz Pickerel Estanislado Pandy, M.D.     SKD/MEDQ  D:  03/11/2013  T:  03/11/2013  Job:  [id number]

## 2013-03-25 ENCOUNTER — Ambulatory Visit (INDEPENDENT_AMBULATORY_CARE_PROVIDER_SITE_OTHER): Payer: Medicare Other | Admitting: Psychiatry

## 2013-03-25 DIAGNOSIS — F329 Major depressive disorder, single episode, unspecified: Secondary | ICD-10-CM

## 2013-03-25 DIAGNOSIS — F411 Generalized anxiety disorder: Secondary | ICD-10-CM

## 2013-03-25 DIAGNOSIS — F419 Anxiety disorder, unspecified: Secondary | ICD-10-CM

## 2013-03-25 NOTE — Progress Notes (Addendum)
Patient:  Sherry Cox   DOB: 05/18/1961  MR Number: [id number]  Location: [facility]:  [address] [place],  [place], [address]  Start: Wednesday 03/25/2013 10:00 AM End: Wednesday 03/25/2013 10:45 AM  Provider/Observer:     Maurice Small, MSW, LCSW   Chief Complaint:      Chief Complaint  Patient presents with  . Anxiety  . Depression    Reason For Service:     The patient seeks services due to history of  symptoms of anxiety and depression. She has severe physical health problems due to kidney disease and has been a dialysis patient since April 2006. Symptoms include excessive worrying, panic attacks, depressed mood, and increased emotionality. Patient is seen today for a follow up appointment.  Interventions Strategy:  Supportive therapy, cognitive behavioral therapy  Participation Level:   Active  Participation Quality:  Appropriate      Behavioral Observation:  Fairly Groomed and Appropriate,   Current Psychosocial Factors:   Content of Session:   Reviewing symptoms, processing feelings, reinforcing patient's efforts to set and maintain boundaries and use of coping techniques, discussing possible termination at next session  Current Status:   Patient reports decreased anxiety and experiencing only one panic attack since last session. She reports improved mood rating depression at a 3 on 10 point scale with one being none and 10 being severe.     Patient Progress:    Patient reports doing well since last session. She reports absence of anxiety and until this past Sunday when she had a panic attack after pulling a muscle in her back. She reports family members continued to have issues but being able to set and maintain boundaries and no longer dwelling on family's problems. Patient has maintained involvement in activity socializing with family and friends as well as attending church. Patient states now accepting she can no longer participate in plays as she has  in the past due to her health but still is looking forward to attending the plays normally performed around the holidays. Patient continues to attend dialysis and reports sometimes experiencing sadness regarding this.   Target Goals:   1. Decrease anxiety as evidenced by decreased intensity and frequency of panic attacks and decreased worry. 2. Improved ability to identify and challenge thinking errors  as evidenced by increased self-acceptance and setting realistic expectations of self regarding  her physical functioning.   Goals Addressed Today:    Goals 1 and 2  Impression/Diagnosis:   The patient has a long-standing history of symptoms of anxiety and depression that appear to have become more intense when she became a dialysis patient in 2006. Symptoms include panic attacks, anxiety, and excessive worry and along with depressed mood. Patient also has a number of other health issues that contribute to her anxiety and mood including thyroid disease. Diagnosis: Anxiety disorder NOS, depressive disorder NOS  Diagnosis:  Axis I:  Depressive disorder, not elsewhere classified  Anxiety disorder          Axis II: No diagnosis

## 2013-03-25 NOTE — Patient Instructions (Signed)
Discussed orally 

## 2013-03-30 ENCOUNTER — Other Ambulatory Visit: Payer: Self-pay

## 2013-03-30 MED ORDER — NITROGLYCERIN 0.4 MG SL SUBL: 0.4000 mg | SUBLINGUAL_TABLET | SUBLINGUAL | Status: DC | PRN

## 2013-04-08 ENCOUNTER — Other Ambulatory Visit: Payer: Self-pay | Admitting: Cardiology

## 2013-04-09 ENCOUNTER — Ambulatory Visit (HOSPITAL_COMMUNITY): Payer: Self-pay | Admitting: Psychiatry

## 2013-04-10 ENCOUNTER — Telehealth (HOSPITAL_COMMUNITY): Payer: Self-pay | Admitting: Psychiatry

## 2013-04-10 ENCOUNTER — Other Ambulatory Visit (HOSPITAL_COMMUNITY): Payer: Self-pay | Admitting: Psychiatry

## 2013-04-10 ENCOUNTER — Other Ambulatory Visit: Payer: Self-pay | Admitting: Cardiology

## 2013-04-10 NOTE — Telephone Encounter (Signed)
Clonazepam 0.5 #30 no refills

## 2013-04-15 ENCOUNTER — Ambulatory Visit (INDEPENDENT_AMBULATORY_CARE_PROVIDER_SITE_OTHER): Payer: Medicare Other | Admitting: Psychiatry

## 2013-04-15 ENCOUNTER — Encounter (HOSPITAL_COMMUNITY): Payer: Self-pay | Admitting: Psychiatry

## 2013-04-15 VITALS — BP 150/62 | Ht <= 58 in | Wt 218.0 lb

## 2013-04-15 DIAGNOSIS — F329 Major depressive disorder, single episode, unspecified: Secondary | ICD-10-CM

## 2013-04-15 MED ORDER — CLONAZEPAM 0.5 MG PO TABS: 0.5000 mg | ORAL_TABLET | Freq: Every day | ORAL | Status: DC

## 2013-04-15 MED ORDER — SERTRALINE HCL 100 MG PO TABS: 100.0000 mg | ORAL_TABLET | Freq: Every day | ORAL | Status: DC
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
## 2013-04-22 ENCOUNTER — Ambulatory Visit (INDEPENDENT_AMBULATORY_CARE_PROVIDER_SITE_OTHER): Payer: Medicare Other | Admitting: Psychiatry

## 2013-04-22 DIAGNOSIS — F329 Major depressive disorder, single episode, unspecified: Secondary | ICD-10-CM

## 2013-04-23 NOTE — Progress Notes (Signed)
Patient:  Sherry Cox   DOB: 08-13-1960  MR Number: [id number]  Location: [facility]:  [address] [place],  [place], [address]  Start: Wednesday 04/22/2013 10:15 AM End: Wednesday 04/22/2013 10:50 AM  Provider/Observer:     Maurice Small, MSW, LCSW   Chief Complaint:      Chief Complaint  Patient presents with  . Stress    Reason For Service:     The patient seeks services due to history of  symptoms of anxiety and depression. She has severe physical health problems due to kidney disease and has been a dialysis patient since April 2006. Symptoms include excessive worrying, panic attacks, depressed mood, and increased emotionality. Patient is seen today for a follow up appointment.  Interventions Strategy:  Supportive therapy, cognitive behavioral therapy  Participation Level:   Active  Participation Quality:  Appropriate      Behavioral Observation:  Fairly Groomed and Appropriate,   Current Psychosocial Factors:   Content of Session:   Reviewing symptoms, reinforcing patient's efforts to set and maintain boundaries and use of coping techniques, termination  Current Status:   Patient reports decreased anxiety and improved mood.     Patient Progress:    Patient reports continuing todo well since last session. She reports decreased anxiety and intensity and frequency of panic attacks. Patient cites several incidents of successful use of coping skills to manage stress and anxiety. She reports family members continued to have issues but being able to set and maintain boundaries and no longer dwelling on family's problems. Patient has maintained involvement in activity socializing with family and friends as well as attending church. Therapist and patient agree patient has met her goals. Therefore, psychotherapy services will be terminated at this time. Patient is encouraged to contact this practice should she need psychotherapy in the future. Patient will continue to  see psychiatrist Dr. Harrington Challenger for medication management.   Target Goals:   1. Decrease anxiety as evidenced by decreased intensity and frequency of panic attacks and decreased worry. 2. Improved ability to identify and challenge thinking errors  as evidenced by increased self-acceptance and setting realistic expectations of self regarding  her physical functioning.   Goals Addressed Today:    Goals 1 and 2  Impression/Diagnosis:   The patient has a long-standing history of symptoms of anxiety and depression that appear to have become more intense when she became a dialysis patient in 2006. Symptoms include panic attacks, anxiety, and excessive worry and along with depressed mood. Patient also has a number of other health issues that contribute to her anxiety and mood including thyroid disease. Currently, symptoms have significantly subsided. Patient is able to successfully use coping skills to minimize symptoms and reports compliance with medication. Diagnosis: Anxiety disorder NOS, depressive disorder NOS  Diagnosis:  Axis I:  Depressive disorder, not elsewhere classified          Axis II: No diagnosis              Outpatient Therapist Discharge Summary  Sherry Cox    October 07, 1960   Admission Date: 06/04/05  Discharge Date:  04/22/2013  Reason for Discharge:  Treatment completed Diagnosis:  Axis I:  Depressive disorder, not elsewhere classified   Axis II:  No diagnosis      Axis V:  71-80  Comments:    Geoffrey Hynes LCSW

## 2013-04-23 NOTE — Patient Instructions (Signed)
Discussed orally 

## 2013-06-03 ENCOUNTER — Other Ambulatory Visit: Payer: Self-pay | Admitting: Cardiology

## 2013-06-09 ENCOUNTER — Other Ambulatory Visit (HOSPITAL_COMMUNITY): Payer: Self-pay | Admitting: Nephrology

## 2013-06-09 DIAGNOSIS — N186 End stage renal disease: Secondary | ICD-10-CM

## 2013-06-09 DIAGNOSIS — T82590A Other mechanical complication of surgically created arteriovenous fistula, initial encounter: Secondary | ICD-10-CM

## 2013-06-10 ENCOUNTER — Other Ambulatory Visit (HOSPITAL_COMMUNITY): Payer: Self-pay | Admitting: Nephrology

## 2013-06-10 ENCOUNTER — Ambulatory Visit (HOSPITAL_COMMUNITY)
Admission: RE | Admit: 2013-06-10 | Discharge: 2013-06-10 | Disposition: A | Discharge status: Home / Self Care | Payer: Medicare Other | Source: Ambulatory Visit | Attending: Nephrology | Admitting: Nephrology

## 2013-06-10 DIAGNOSIS — I6529 Occlusion and stenosis of unspecified carotid artery: Secondary | ICD-10-CM | POA: Insufficient documentation

## 2013-06-10 DIAGNOSIS — Z992 Dependence on renal dialysis: Secondary | ICD-10-CM | POA: Insufficient documentation

## 2013-06-10 DIAGNOSIS — N186 End stage renal disease: Secondary | ICD-10-CM | POA: Insufficient documentation

## 2013-06-10 DIAGNOSIS — I2510 Atherosclerotic heart disease of native coronary artery without angina pectoris: Secondary | ICD-10-CM | POA: Insufficient documentation

## 2013-06-10 DIAGNOSIS — F411 Generalized anxiety disorder: Secondary | ICD-10-CM | POA: Insufficient documentation

## 2013-06-10 DIAGNOSIS — T82590A Other mechanical complication of surgically created arteriovenous fistula, initial encounter: Secondary | ICD-10-CM

## 2013-06-10 DIAGNOSIS — Z87891 Personal history of nicotine dependence: Secondary | ICD-10-CM | POA: Insufficient documentation

## 2013-06-10 DIAGNOSIS — T82898A Other specified complication of vascular prosthetic devices, implants and grafts, initial encounter: Secondary | ICD-10-CM | POA: Insufficient documentation

## 2013-06-10 DIAGNOSIS — Y832 Surgical operation with anastomosis, bypass or graft as the cause of abnormal reaction of the patient, or of later complication, without mention of misadventure at the time of the procedure: Secondary | ICD-10-CM | POA: Insufficient documentation

## 2013-06-10 DIAGNOSIS — Z951 Presence of aortocoronary bypass graft: Secondary | ICD-10-CM | POA: Insufficient documentation

## 2013-06-10 DIAGNOSIS — E785 Hyperlipidemia, unspecified: Secondary | ICD-10-CM | POA: Insufficient documentation

## 2013-06-10 MED ORDER — IOHEXOL 300 MG/ML  SOLN
100.0000 mL | Freq: Once | INTRAMUSCULAR | Status: AC | PRN
  Administered 2013-06-10: 50 mL via INTRAVENOUS

## 2013-06-10 NOTE — H&P (Signed)
Sherry Cox is an 52 y.o. female.   Chief Complaint: Increased venous pressures during hemodialysis via left arm fistula. HPI: Here for fistulogram.  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  Past Medical History  Diagnosis Date  . Overweight(278.02)   . Anxiety disorder   . Carotid artery stenosis     12/10/2008 doppler 0000000 RICA, [id number] LICA/see evaluation by Dr. Oneida Alar 2011  . CAD (coronary artery disease)     Total RCA, stress echo [facility] 11/2008-no ischemia, EF 55%; h/o CABG  . Hyperlipidemia   . ESRD on dialysis     Transplant consideration  . Tobacco abuse   . Hx of CABG     Off pump LIMA to LAD  05/2006  . Ejection fraction     EF 60-65%, echo, June, 2009  . Murmur     October, 2012  . Neuropathy   . Anxiety   . Hx of tobacco use, presenting hazards to health 04/26/2012  . Irregular heart beat   . Ankle injury     Past Surgical History  Procedure Laterality Date  . Coronary artery bypass graft  05/2006    Off pump LIMA to LAD  . Inner ear surgery    . Plastic surgery on leg    . Cardiac valve surgery  2007  . Cholecystectomy  1995    Gall Bladder    Family History  Problem Relation Age of Onset  . Depression Sister   . Dementia Sister   . OCD Sister   . Diabetes Sister   . Hypertension Sister   . Heart attack Sister   . Bipolar disorder Brother   . Drug abuse Brother   . Diabetes Brother   . Hypertension Brother   . Heart disease Brother   . Bipolar disorder Other   . ADD / ADHD Other   . Seizures Other   . Bipolar disorder Other   . Alcohol abuse Other   . Dementia Mother   . Heart disease Mother   . Hypertension Mother   . Heart attack Mother   . Alcohol abuse Father   . Heart disease Father     Heart Disease before age 20  . Hypertension Father   . Heart attack Father   . Dementia Maternal Aunt   . Paranoid behavior Neg Hx   . Schizophrenia Neg Hx   .  Sexual abuse Neg Hx   . Physical abuse Neg Hx   . OCD Sister   . Heart disease Sister     Heart Disease before age 57   Social History:  reports that she quit smoking about 13 months ago. Her smoking use included Cigarettes. She has a 9 pack-year smoking history. She has never used smokeless tobacco. She reports that she does not drink alcohol or use illicit drugs.  Allergies: No Known Allergies   (Not in a hospital admission)  No results found for this or any previous visit (from the past 48 hour(s)). No results found.  Review of Systems  Constitutional: Negative for fever and chills.  Respiratory: Negative for cough and shortness of breath.   Cardiovascular: Negative for chest pain.  Gastrointestinal: Negative for abdominal pain.    There were no vitals taken for this visit. Physical Exam  Constitutional: She appears well-developed and well-nourished.  Cardiovascular: Normal rate, regular rhythm and normal heart sounds.  Exam reveals no gallop and no friction rub.   No  murmur heard. Respiratory: Breath sounds normal. No respiratory distress. She has no wheezes. She has no rales. She exhibits no tenderness.     Assessment/Plan Venous angioplasty of left subclavian vein and possible angioplasty of central cephalic vein.  Guenther Dunshee T 06/10/2013, 11:17 AM

## 2013-07-15 ENCOUNTER — Other Ambulatory Visit: Payer: Self-pay | Admitting: Internal Medicine

## 2013-07-15 ENCOUNTER — Ambulatory Visit (HOSPITAL_COMMUNITY): Payer: Self-pay | Admitting: Psychiatry

## 2013-07-31 ENCOUNTER — Ambulatory Visit (HOSPITAL_COMMUNITY): Payer: Self-pay | Admitting: Psychiatry

## 2013-08-10 ENCOUNTER — Other Ambulatory Visit (HOSPITAL_COMMUNITY): Payer: Self-pay | Admitting: Psychiatry

## 2013-08-12 ENCOUNTER — Ambulatory Visit (INDEPENDENT_AMBULATORY_CARE_PROVIDER_SITE_OTHER): Payer: Medicare Other | Admitting: Psychiatry

## 2013-08-12 ENCOUNTER — Encounter (HOSPITAL_COMMUNITY): Payer: Self-pay | Admitting: Psychiatry

## 2013-08-12 VITALS — BP 140/62 | Ht <= 58 in | Wt 215.0 lb

## 2013-08-12 DIAGNOSIS — F329 Major depressive disorder, single episode, unspecified: Secondary | ICD-10-CM

## 2013-08-12 DIAGNOSIS — F3289 Other specified depressive episodes: Secondary | ICD-10-CM

## 2013-08-12 MED ORDER — SERTRALINE HCL 100 MG PO TABS: 100.0000 mg | ORAL_TABLET | Freq: Every day | ORAL | Status: DC

## 2013-08-12 MED ORDER — CLONAZEPAM 0.5 MG PO TABS: 0.5000 mg | ORAL_TABLET | Freq: Every day | ORAL | Status: DC
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
## 2013-08-27 ENCOUNTER — Other Ambulatory Visit: Payer: Self-pay | Admitting: Vascular Surgery

## 2013-08-27 DIAGNOSIS — I6529 Occlusion and stenosis of unspecified carotid artery: Secondary | ICD-10-CM

## 2013-08-27 DIAGNOSIS — Z48812 Encounter for surgical aftercare following surgery on the circulatory system: Secondary | ICD-10-CM

## 2013-09-03 ENCOUNTER — Other Ambulatory Visit: Payer: Self-pay

## 2013-09-03 ENCOUNTER — Ambulatory Visit: Payer: Self-pay | Admitting: Vascular Surgery

## 2013-10-26 ENCOUNTER — Other Ambulatory Visit: Payer: Self-pay | Admitting: Cardiology

## 2013-11-09 ENCOUNTER — Other Ambulatory Visit: Payer: Self-pay | Admitting: Cardiology

## 2013-11-09 ENCOUNTER — Ambulatory Visit (INDEPENDENT_AMBULATORY_CARE_PROVIDER_SITE_OTHER): Payer: Medicare Other | Admitting: Psychiatry

## 2013-11-09 ENCOUNTER — Encounter (HOSPITAL_COMMUNITY): Payer: Self-pay | Admitting: Psychiatry

## 2013-11-09 VITALS — BP 140/60 | Ht <= 58 in | Wt 219.0 lb

## 2013-11-09 DIAGNOSIS — F329 Major depressive disorder, single episode, unspecified: Secondary | ICD-10-CM

## 2013-11-09 DIAGNOSIS — F3289 Other specified depressive episodes: Secondary | ICD-10-CM

## 2013-11-09 DIAGNOSIS — I6529 Occlusion and stenosis of unspecified carotid artery: Secondary | ICD-10-CM

## 2013-11-09 MED ORDER — SERTRALINE HCL 100 MG PO TABS: 100.0000 mg | ORAL_TABLET | Freq: Every day | ORAL | Status: DC

## 2013-11-09 MED ORDER — CLONAZEPAM 0.5 MG PO TABS: ORAL_TABLET | ORAL | Status: DC
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
## 2013-12-07 ENCOUNTER — Ambulatory Visit (INDEPENDENT_AMBULATORY_CARE_PROVIDER_SITE_OTHER): Payer: Medicare Other | Admitting: Psychiatry

## 2013-12-07 DIAGNOSIS — F329 Major depressive disorder, single episode, unspecified: Secondary | ICD-10-CM

## 2013-12-07 DIAGNOSIS — F3289 Other specified depressive episodes: Secondary | ICD-10-CM

## 2013-12-07 DIAGNOSIS — F411 Generalized anxiety disorder: Secondary | ICD-10-CM

## 2013-12-07 DIAGNOSIS — F419 Anxiety disorder, unspecified: Secondary | ICD-10-CM

## 2013-12-07 NOTE — Patient Instructions (Signed)
Discussed orally 

## 2013-12-07 NOTE — Progress Notes (Signed)
   THERAPIST PROGRESS NOTE  Session Time: Monday 12/07/2013 10:15 AM -11:00 AM  Participation Level: Active  Behavioral Response: CasualAlertAnxious  Type of Therapy: Individual Therapy  Treatment Goals addressed: Improve ability to manage stress and anxiety  Interventions: Supportive  Summary: Sherry Cox is a 53 y.o. female who presents with a history of symptoms of anxiety and depression. She has severe physical health problems due to kidney disease and has been a dialysis patient since April 2006. Symptoms include excessive worrying, panic attacks, depressed mood, and increased emotionality. She is a returning patient to this clinician and is resuming services due to increased anxiety and depressed mood. Patient is experiencing grief and loss issues related to the deaths of her sister in November 2014 and her brother in April 2015 due to hemachromatosis. Patient recently was diagnosed with this same illness but states not really worrying that much about self as her condition was detected early and patient is receiving treatment. However she continues to experience grief and loss issues especially related to her brother. She also reports grief and loss issues related to the deaths of 2 close friends in the past few months. In addition, several extended family members have died in the past several months. Patient is experiencing increased panic attacks and emotionality reporting sometimes crying most of the day.     Suicidal/Homicidal: No  Therapist Response: Therapist works with patient to identify and verbalize feelings, process grief and loss issues, and identify ways to use her support system. Therapist also encourages patient to journal  Plan: Return again in 2 weeks.  Diagnosis: Axis I: Depressive Disorder NOS    Axis II: No diagnosis    Devaris Quirk, LCSW 12/07/2013

## 2013-12-11 ENCOUNTER — Ambulatory Visit (INDEPENDENT_AMBULATORY_CARE_PROVIDER_SITE_OTHER): Payer: Medicare Other | Admitting: Cardiology

## 2013-12-11 ENCOUNTER — Encounter: Payer: Self-pay | Admitting: Cardiology

## 2013-12-11 VITALS — BP 128/65 | HR 97 | Ht <= 58 in | Wt 219.4 lb

## 2013-12-11 DIAGNOSIS — N186 End stage renal disease: Secondary | ICD-10-CM

## 2013-12-11 DIAGNOSIS — I2510 Atherosclerotic heart disease of native coronary artery without angina pectoris: Secondary | ICD-10-CM

## 2013-12-11 DIAGNOSIS — I6529 Occlusion and stenosis of unspecified carotid artery: Secondary | ICD-10-CM

## 2013-12-11 DIAGNOSIS — F17200 Nicotine dependence, unspecified, uncomplicated: Secondary | ICD-10-CM

## 2013-12-11 DIAGNOSIS — E785 Hyperlipidemia, unspecified: Secondary | ICD-10-CM

## 2013-12-11 DIAGNOSIS — Z720 Tobacco use: Secondary | ICD-10-CM

## 2013-12-11 DIAGNOSIS — Z992 Dependence on renal dialysis: Secondary | ICD-10-CM

## 2013-12-11 MED ORDER — ASPIRIN EC 81 MG PO TBEC: 81.0000 mg | DELAYED_RELEASE_TABLET | Freq: Every day | ORAL | Status: DC

## 2013-12-11 NOTE — Assessment & Plan Note (Signed)
She continues on dialysis and she is stable. She and her other physicians will be treating her elevated iron levels.

## 2013-12-11 NOTE — Assessment & Plan Note (Signed)
Her carotid disease is followed very carefully by Dr. Oneida Alar. No further workup.

## 2013-12-11 NOTE — Assessment & Plan Note (Signed)
Coronary disease is stable. No further workup at this time. 

## 2013-12-11 NOTE — Patient Instructions (Signed)
   Decrease Aspirin to 81mg  daily  Continue all other medications.   Your physician wants you to follow up in:  1 year.  You will receive a reminder letter in the mail one-two months in advance.  If you don't receive a letter, please call our office to schedule the follow up appointment

## 2013-12-11 NOTE — Progress Notes (Signed)
Patient ID: Sherry Cox, female   DOB: 21-Feb-1961, 53 y.o.   MRN: [id number]    HPI  Patient is seen to followup coronary disease. She's actually doing well. She's had very careful ongoing evaluation by vascular surgery. She actually had an angiogram by Dr. Oneida Alar. Ultimately it was decided that her carotid disease is stable. She's not been having any significant chest pain. It seems that there is history in the family of hemochromatosis. Her iron level has been high but this may be related to her dialysis. This is being assessed at this time. She continues to smoke 2 or 3 cigarettes per week. She also takes intermittent nitroglycerin. She takes only a small part of the nitroglycerin tablet. Both of these things appeared to calm her nerves.  No Known Allergies  Current Outpatient Prescriptions  Medication Sig Dispense Refill  . albuterol (PROAIR HFA) 108 (90 BASE) MCG/ACT inhaler Inhale 2 puffs into the lungs every 4 (four) hours as needed. For wheezing      . aspirin 325 MG tablet Take 325 mg by mouth daily.        . clonazePAM (KLONOPIN) 0.5 MG tablet Take one three times a day  90 tablet  2  . folic acid-vitamin b complex-vitamin c-selenium-zinc (DIALYVITE) 3 MG TABS Take 1 tablet by mouth daily.        Marland Kitchen HYDROcodone-acetaminophen (VICODIN) 5-500 MG per tablet Take by mouth 2 (two) times daily.       Marland Kitchen levothyroxine (SYNTHROID, LEVOTHROID) 150 MCG tablet Take 1 tablet by mouth Daily.      . metoprolol (LOPRESSOR) 50 MG tablet Take 50 mg by mouth See admin instructions. Take 50mg  twice daily on Mon, Wed, Fri & 50mg  once daily on all other days      . nitroGLYCERIN (NITROSTAT) 0.4 MG SL tablet Place 1 tablet (0.4 mg total) under the tongue every 5 (five) minutes as needed for chest pain.  75 tablet  3  . sertraline (ZOLOFT) 100 MG tablet Take 1 tablet (100 mg total) by mouth daily.  90 tablet  3  . sevelamer (RENVELA) 800 MG tablet Take 4,000 mg by mouth 3 (three) times daily with meals. Take  5 tablets (4000mg ) by mouth with meals and 3 tablets by mouth with snacks.      . simvastatin (ZOCOR) 40 MG tablet Take 1 tablet (40 mg total) by mouth daily at 6 PM.  30 tablet  6  . sodium bicarbonate 650 MG tablet Take 650 mg by mouth 2 (two) times daily.       Marland Kitchen sulfacetamide (BLEPH-10) 10 % ophthalmic solution Place 2 drops into both eyes daily as needed. For dry/itchy eyes      . topiramate (TOPAMAX) 25 MG tablet as needed.      . zolpidem (AMBIEN) 5 MG tablet Take 5 mg by mouth at bedtime as needed. For sleep       No current facility-administered medications for this visit.    History   Social History  . Marital Status: Married    Spouse Name: N/A    Number of Children: N/A  . Years of Education: N/A   Occupational History  . DISABLED    Social History Main Topics  . Smoking status: Former Smoker -- 0.30 packs/day for 30 years    Types: Cigarettes    Quit date: 04/26/2012  . Smokeless tobacco: Never Used     Comment: 2 to 3 cigs a week as of 11/28/2012  . Alcohol  Use: No  . Drug Use: No  . Sexual Activity: Yes   Other Topics Concern  . Not on file   Social History Narrative  . No narrative on file    Family History  Problem Relation Age of Onset  . Depression Sister   . Dementia Sister   . OCD Sister   . Diabetes Sister   . Hypertension Sister   . Heart attack Sister   . Bipolar disorder Brother   . Drug abuse Brother   . Diabetes Brother   . Hypertension Brother   . Heart disease Brother   . Bipolar disorder Other   . ADD / ADHD Other   . Seizures Other   . Bipolar disorder Other   . Alcohol abuse Other   . Dementia Mother   . Heart disease Mother   . Hypertension Mother   . Heart attack Mother   . Alcohol abuse Father   . Heart disease Father     Heart Disease before age 48  . Hypertension Father   . Heart attack Father   . Dementia Maternal Aunt   . Paranoid behavior Neg Hx   . Schizophrenia Neg Hx   . Sexual abuse Neg Hx   . Physical  abuse Neg Hx   . OCD Sister   . Heart disease Sister     Heart Disease before age 39    Past Medical History  Diagnosis Date  . Overweight   . Anxiety disorder   . Carotid artery stenosis     12/10/2008 doppler 0000000 RICA, [id number] LICA/see evaluation by Dr. Oneida Alar 2011  . CAD (coronary artery disease)     Total RCA, stress echo [facility] 11/2008-no ischemia, EF 55%; h/o CABG  . Hyperlipidemia   . ESRD on dialysis     Transplant consideration  . Tobacco abuse   . Hx of CABG     Off pump LIMA to LAD  05/2006  . Ejection fraction     EF 60-65%, echo, June, 2009  . Murmur     October, 2012  . Neuropathy   . Anxiety   . Hx of tobacco use, presenting hazards to health 04/26/2012  . Irregular heart beat   . Ankle injury   . Hemochromatosis     Past Surgical History  Procedure Laterality Date  . Coronary artery bypass graft  05/2006    Off pump LIMA to LAD  . Inner ear surgery    . Plastic surgery on leg    . Cardiac valve surgery  2007  . Cholecystectomy  1995    Gall Bladder    Patient Active Problem List   Diagnosis Date Noted  . Hx of tobacco use, presenting hazards to health     Priority: High  . Hx of CABG     Priority: High  . 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 Occlusion and stenosis of carotid artery without mention of cerebral infarction 08/30/2011  . Ejection fraction   . CAD (coronary artery disease)   . Carotid artery stenosis   . Hyperlipidemia   . ESRD on dialysis   . HYPOTHYROIDISM 09/26/2009  . Overweight 06/09/2009  . ANXIETY 06/09/2009    ROS   Patient denies fever, chills, headache, sweats, rash, change in vision, change in hearing, chest pain, cough, nausea vomiting, urinary symptoms. All other systems are reviewed and are negative.  PHYSICAL EXAM  Patient is overweight. She is oriented to person time and place. Affect is  normal. The shunt in her left arm is stable. It has a bruised appearance which is the usual appearance. Head is  atraumatic. Sclera and conjunctiva are normal. Lungs are clear. Respiratory effort is nonlabored. There is no jugulovenous distention. Cardiac exam reveals an S1 and S2. The abdomen is soft. There is no peripheral edema. There no musculoskeletal deformities. There no skin rashes.  Filed Vitals:   12/11/13 1459  BP: 128/65  Pulse: 97  Height: 4\' 10"  (1.473 m)  Weight: 219 lb 6.4 oz (99.519 kg)   EKG is done today and reviewed by me. There sinus rhythm. There are diffuse nonspecific ST-T wave changes. There is no change from the past.  ASSESSMENT & PLAN

## 2013-12-11 NOTE — Assessment & Plan Note (Signed)
Her lipids are being treated. No change in therapy. 

## 2013-12-11 NOTE — Assessment & Plan Note (Signed)
Of course I've encouraged her to stop smoking completely.

## 2013-12-16 ENCOUNTER — Other Ambulatory Visit: Payer: Self-pay

## 2013-12-16 MED ORDER — NITROGLYCERIN 0.4 MG SL SUBL: 0.4000 mg | SUBLINGUAL_TABLET | SUBLINGUAL | Status: DC | PRN

## 2013-12-16 MED ORDER — METOPROLOL TARTRATE 50 MG PO TABS: ORAL_TABLET | ORAL | Status: DC

## 2013-12-18 ENCOUNTER — Encounter (HOSPITAL_COMMUNITY): Payer: Self-pay | Admitting: Psychiatry

## 2013-12-18 ENCOUNTER — Ambulatory Visit (INDEPENDENT_AMBULATORY_CARE_PROVIDER_SITE_OTHER): Payer: Medicare Other | Admitting: Psychiatry

## 2013-12-18 VITALS — BP 130/68 | Ht <= 58 in | Wt 216.0 lb

## 2013-12-18 DIAGNOSIS — F329 Major depressive disorder, single episode, unspecified: Secondary | ICD-10-CM

## 2013-12-18 DIAGNOSIS — F3289 Other specified depressive episodes: Secondary | ICD-10-CM

## 2013-12-18 MED ORDER — SERTRALINE HCL 100 MG PO TABS: 100.0000 mg | ORAL_TABLET | Freq: Every day | ORAL | Status: DC

## 2013-12-18 MED ORDER — CLONAZEPAM 0.5 MG PO TABS: ORAL_TABLET | ORAL | Status: DC
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
## 2013-12-30 ENCOUNTER — Ambulatory Visit (INDEPENDENT_AMBULATORY_CARE_PROVIDER_SITE_OTHER): Payer: Medicare Other | Admitting: Psychiatry

## 2013-12-30 DIAGNOSIS — F3289 Other specified depressive episodes: Secondary | ICD-10-CM

## 2013-12-30 DIAGNOSIS — F329 Major depressive disorder, single episode, unspecified: Secondary | ICD-10-CM

## 2013-12-30 NOTE — Progress Notes (Signed)
   THERAPIST PROGRESS NOTE  Session Time:  Wednesday 12/30/2013 11:05 AM -11:55 AM  Participation Level: Active  Behavioral Response: CasualAlertAnxious and Depressed  Type of Therapy: Individual Therapy  Treatment Goals addressed: Process grief and loss issues and be able to discuss without becoming overwhelmed  Interventions: Supportive, psychoeducation  Summary: Sherry Cox is a 53 y.o. female who presents with ahistory of symptoms of anxiety and depression. She has severe physical health problems due to kidney disease and has been a dialysis patient since April 2006. Symptoms include excessive worrying, panic attacks, depressed mood, and increased emotionality. She is a returning patient to this clinician and is resuming services due to increased anxiety and depressed mood. Patient is experiencing grief and loss issues related to the deaths of her sister in November 2014 and her brother in April 2015 due to hemachromatosis. Patient recently was diagnosed with this same illness but states not really worrying that much about self as her condition was detected early and patient is receiving treatment. However she continues to experience grief and loss issues especially related to her brother. She also reports grief and loss issues related to the deaths of 2 close friends in the past few months. In addition, several extended family members have died in the past several months. Patient is experiencing increased panic attacks and emotionality reporting sometimes crying most of the day.   Since last session, patient reports resuming regular use of her medication and experiencing decreased crying spells.  She has been more active and has been socializing with family. She also has been trying to provide emotional support to other family members who has an in-law who has a terminal illness. Patient states it is helping her to help someone else. She reports Father's Day was very difficult as she  thought about deceased brother's children. This also triggered grief/loss issues regarding patient's deceased father. She reports less anger with his daughter and having more empathy.      Suicidal/Homicidal: No  Therapist Response: Therapist works with patient to develop treatment plan, identify and verbalize feelings, identify stages of grief, review relaxation techniques  Plan: Return again in 3 weeks. Patient agrees to bring objects/pictures that remind her of her brother to next session.  Diagnosis: Axis I: Depressive Disorder NOS    Axis II: Deferred    BYNUM,PEGGY, LCSW 12/30/2013

## 2013-12-30 NOTE — Patient Instructions (Signed)
Discussed orally 

## 2014-01-06 ENCOUNTER — Other Ambulatory Visit: Payer: Self-pay | Admitting: Cardiology

## 2014-01-22 ENCOUNTER — Ambulatory Visit (INDEPENDENT_AMBULATORY_CARE_PROVIDER_SITE_OTHER): Payer: Medicare Other | Admitting: Psychiatry

## 2014-01-22 DIAGNOSIS — F3289 Other specified depressive episodes: Secondary | ICD-10-CM

## 2014-01-22 DIAGNOSIS — F329 Major depressive disorder, single episode, unspecified: Secondary | ICD-10-CM

## 2014-01-22 NOTE — Patient Instructions (Signed)
Discussed orally 

## 2014-01-22 NOTE — Progress Notes (Signed)
   THERAPIST PROGRESS NOTE  Session Time: Friday 01/22/2014 10:25 AM - 10:55 AM  Participation Level: Minimal  Behavioral Response: CasualDrowsyAnxious  Type of Therapy: Individual Therapy  Treatment Goals addressed:  Process grief and loss issues and be able to discuss without becoming overwhelmed   Interventions: Supportive  Summary: Sherry Cox is a 53 y.o. female who presents with a history of symptoms of anxiety and depression. She has severe physical health problems due to kidney disease and has been a dialysis patient since April 2006. Symptoms include excessive worrying, panic attacks, depressed mood, and increased emotionality. She is a returning patient to this clinician and is resuming services due to increased anxiety and depressed mood. Patient is experiencing grief and loss issues related to the deaths of her sister in November 2014 and her brother in April 2015 due to hemachromatosis. Patient recently was diagnosed with this same illness but states not really worrying that much about self as her condition was detected early and patient is receiving treatment. However she continues to experience grief and loss issues especially related to her brother. She also reports grief and loss issues related to the deaths of 2 close friends in the past few months. In addition, several extended family members have died in the past several months. Patient is experiencing increased panic attacks and emotionality reporting sometimes crying most of the day.   Patient reports increased headaches and panic attacks since last session. She attributes headaches to sinus issues and says headaches tend to trigger the panic attacks. She has been trying to use relaxation breathing to intervene in the panic cycle. She reports feeling positive about recent conversation with her deceased brother's wife and daughter. She says they talked about her brother and this was comforting. She has had increased  thoughts about deceased sister as her birthday is [birth year].  She reports talking to her younger sister and posting on FaceBook to cope with loss of sister. She shares more information regarding memories of her sister today. Patient is scheduled for liver biopsy on January 27, 2014.   Suicidal/Homicidal: No  Therapist Response: Therapist works with patient to identify memorializations and memories of sister and brother.  Plan: Return again in 3 weeks.  Diagnosis: Axis I: Depressive Disorder NOS    Axis II: Deferred    Worthington Cruzan, LCSW 01/22/2014

## 2014-02-12 ENCOUNTER — Ambulatory Visit (INDEPENDENT_AMBULATORY_CARE_PROVIDER_SITE_OTHER): Payer: Medicare Other | Admitting: Psychiatry

## 2014-02-12 DIAGNOSIS — F329 Major depressive disorder, single episode, unspecified: Secondary | ICD-10-CM

## 2014-02-12 DIAGNOSIS — F3289 Other specified depressive episodes: Secondary | ICD-10-CM

## 2014-02-12 NOTE — Progress Notes (Signed)
   THERAPIST PROGRESS NOTE  Session Time: Friday 02/12/2014 10:10 AM - 10:55 AM  Participation Level: Active  Behavioral Response: CasualAlertAnxious  Type of Therapy: Individual Therapy  Treatment Goals addressed:  Process grief and loss issues and be able to discuss without becoming overwhelmed   Interventions: Supportive  Summary: Sherry Cox is a 53 y.o. female who presents with a history of symptoms of anxiety and depression. She has severe physical health problems due to kidney disease and has been a dialysis patient since April 2006. Symptoms include excessive worrying, panic attacks, depressed mood, and increased emotionality. She is a returning patient to this clinician and is resuming services due to increased anxiety and depressed mood. Patient is experiencing grief and loss issues related to the deaths of her sister in November 2014 and her brother in April 2015 due to hemachromatosis. Patient recently was diagnosed with this same illness but states not really worrying that much about self as her condition was detected early and patient is receiving treatment. However she continues to experience grief and loss issues especially related to her brother. She also reports grief and loss issues related to the deaths of 2 close friends in the past few months. In addition, several extended family members have died in the past several months. Patient is experiencing increased panic attacks and emotionality reporting sometimes crying most of the day.   Patient reports increased stress and panic attacks related to a recent liver biopsy and waiting a week for the results. She is relieved biopsy was negative for cancer  and reports decreased panic attacks since receiving results. She reports increased thoughts about brother as her family reunion is this coming Sunday. She shares memories of brother and last year's family reunion. She states his death has been harder to deal with than her sister's  death as his death was so sudden. She reports feeling cheated that she did not have time with him at the end of his life. She states no longer being angry with brother's daughter and now being able to understand and accept this was a lost for her as well.      Suicidal/Homicidal: No  Therapist Response: Therapist works with patient to process feelings, discussing this patient's stage in the grieving process, identify ways to cope with memories of her brother and potential comments from family members at the family reunion  Plan: Return again in 78 weeks.  Diagnosis: Axis I: Depressive Disorder NOS    Axis II: No diagnosis    Dillin Lofgren, LCSW 02/12/2014

## 2014-02-12 NOTE — Patient Instructions (Signed)
Discussed orally 

## 2014-03-08 ENCOUNTER — Other Ambulatory Visit (HOSPITAL_COMMUNITY): Payer: Self-pay

## 2014-03-08 ENCOUNTER — Ambulatory Visit: Payer: Self-pay | Admitting: Family

## 2014-03-12 ENCOUNTER — Ambulatory Visit (HOSPITAL_COMMUNITY): Payer: Self-pay | Admitting: Psychiatry

## 2014-03-19 ENCOUNTER — Encounter (HOSPITAL_COMMUNITY): Payer: Self-pay | Admitting: Psychiatry

## 2014-03-19 ENCOUNTER — Ambulatory Visit (INDEPENDENT_AMBULATORY_CARE_PROVIDER_SITE_OTHER): Payer: Medicare Other | Admitting: Psychiatry

## 2014-03-19 VITALS — BP 141/65 | HR 84 | Ht <= 58 in | Wt 211.4 lb

## 2014-03-19 DIAGNOSIS — F329 Major depressive disorder, single episode, unspecified: Secondary | ICD-10-CM

## 2014-03-19 DIAGNOSIS — F3289 Other specified depressive episodes: Secondary | ICD-10-CM

## 2014-03-19 MED ORDER — SERTRALINE HCL 100 MG PO TABS: 100.0000 mg | ORAL_TABLET | Freq: Every day | ORAL | Status: DC

## 2014-03-19 MED ORDER — CLONAZEPAM 0.5 MG PO TABS: ORAL_TABLET | ORAL | Status: DC
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
## 2014-04-02 ENCOUNTER — Ambulatory Visit (INDEPENDENT_AMBULATORY_CARE_PROVIDER_SITE_OTHER): Payer: Medicare Other | Admitting: Psychiatry

## 2014-04-02 DIAGNOSIS — F3289 Other specified depressive episodes: Secondary | ICD-10-CM

## 2014-04-02 DIAGNOSIS — F329 Major depressive disorder, single episode, unspecified: Secondary | ICD-10-CM

## 2014-04-02 NOTE — Progress Notes (Signed)
   THERAPIST PROGRESS NOTE  Session Time: Friday 04/02/2014 9:00 AM - 9:45 AM  Participation Level: Active  Behavioral Response: CasualAlertEuthymic  Type of Therapy: Individual Therapy  Treatment Goals addressed:  Process grief and loss issues and be able to discuss without becoming overwhelmed   Interventions: Supportive, grief therapy, CBT  Summary: Sherry Cox is a 53 y.o. female who presents with a history of symptoms of anxiety and depression. She has severe physical health problems due to kidney disease and has been a dialysis patient since April 2006. Symptoms include excessive worrying, panic attacks, depressed mood, and increased emotionality. She is a returning patient to this clinician and is resuming services due to increased anxiety and depressed mood. Patient is experiencing grief and loss issues related to the deaths of her sister in November 2014 and her brother in April 2015 due to hemachromatosis. Patient recently was diagnosed with this same illness but states not really worrying that much about self as her condition was detected early and patient is receiving treatment. However she continues to experience grief and loss issues especially related to her brother. She also reports grief and loss issues related to the deaths of 2 close friends in the past few months. In addition, several extended family members have died in the past several months. Patient is experiencing increased panic attacks and emotionality reporting sometimes crying most of the day.   Patient reports doing well since last session and having only one panic attack. She reports sadness during the moments of reflection about deceased family members during her recent family reunion but being abe to manage and enjoy the rest of the reunion. She reports continuing to miss sister and brother but no longer feeling overwhelmed. She focuses on happy memories. She also reports decreased worry about other family  members and issues as she is limiting her exposure to information and Face Book. She also has been more assertive and set boundaries. Patient also successfully is using relaxation techniques and distracting activities. She report continued involvement in activity and socialization with family and friends. She reports having a very close friend to whom she confides which has been helpful. She also relies on her spirituality.   Suicidal/Homicidal: No  Therapist Response: Therapist works with patient to process feelings, review goals and progress, reinforce patient's use of coping skills, identify coping statemenets.  Plan: Therapist and patient agree to discontinue therapy services as patient has accomplished her goals. She will continue to see psychiatrist Dr. Harrington Challenger for medication management. Patient is encouraged to call this practice should she need therapy services in the future.  Diagnosis: Axis I: Depressive Disorder NOS    Axis II: No diagnosis    Sherry Whitelock, LCSW 04/02/2014                Outpatient Therapist Discharge Summary  Sherry Cox    [birth year]   Admission Date: 12/07/2013   Discharge Date:  04/02/2014  Reason for Discharge:  Treatment completed Diagnosis:  Axis I:  Depressive disorder, not elsewhere classified   Axis II:  No diagnosis     Axis V: 71-80  Comments:  Patient will continue to see psychiatrist Dr. Harrington Challenger for medication management. Patient is encouraged to call this practice should she need therapy services in the future.    Sherry Starling LCSW

## 2014-04-02 NOTE — Patient Instructions (Signed)
Discussed orally 

## 2014-04-09 ENCOUNTER — Other Ambulatory Visit (HOSPITAL_COMMUNITY): Payer: Self-pay

## 2014-04-09 ENCOUNTER — Ambulatory Visit: Payer: Self-pay | Admitting: Family

## 2014-04-28 ENCOUNTER — Other Ambulatory Visit: Payer: Self-pay | Admitting: Cardiology

## 2014-04-29 ENCOUNTER — Encounter: Payer: Self-pay | Admitting: Family

## 2014-04-30 ENCOUNTER — Other Ambulatory Visit (HOSPITAL_COMMUNITY): Payer: Self-pay

## 2014-04-30 ENCOUNTER — Ambulatory Visit: Payer: Self-pay | Admitting: Family

## 2014-05-28 ENCOUNTER — Encounter: Payer: Self-pay | Admitting: Family

## 2014-05-31 ENCOUNTER — Other Ambulatory Visit (HOSPITAL_COMMUNITY): Payer: Self-pay

## 2014-05-31 ENCOUNTER — Ambulatory Visit: Payer: Self-pay | Admitting: Family

## 2014-06-02 ENCOUNTER — Other Ambulatory Visit: Payer: Self-pay | Admitting: Cardiology

## 2014-06-11 ENCOUNTER — Ambulatory Visit (INDEPENDENT_AMBULATORY_CARE_PROVIDER_SITE_OTHER): Payer: Medicare Other | Admitting: Psychiatry

## 2014-06-11 ENCOUNTER — Encounter (HOSPITAL_COMMUNITY): Payer: Self-pay | Admitting: Psychiatry

## 2014-06-11 VITALS — BP 167/67 | HR 90 | Ht <= 58 in | Wt 214.8 lb

## 2014-06-11 DIAGNOSIS — F329 Major depressive disorder, single episode, unspecified: Secondary | ICD-10-CM

## 2014-06-11 MED ORDER — CLONAZEPAM 0.5 MG PO TABS: ORAL_TABLET | ORAL | Status: DC

## 2014-06-11 MED ORDER — SERTRALINE HCL 100 MG PO TABS: 100.0000 mg | ORAL_TABLET | Freq: Every day | ORAL | Status: DC
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
## 2014-06-14 ENCOUNTER — Ambulatory Visit (HOSPITAL_COMMUNITY): Payer: Self-pay | Admitting: Psychiatry

## 2014-06-17 ENCOUNTER — Encounter (HOSPITAL_COMMUNITY): Payer: Self-pay | Admitting: Vascular Surgery

## 2014-07-28 ENCOUNTER — Other Ambulatory Visit: Payer: Self-pay | Admitting: Cardiology

## 2014-08-04 ENCOUNTER — Other Ambulatory Visit: Payer: Self-pay | Admitting: Cardiology

## 2014-09-13 ENCOUNTER — Ambulatory Visit (INDEPENDENT_AMBULATORY_CARE_PROVIDER_SITE_OTHER): Payer: Medicare Other | Admitting: Psychiatry

## 2014-09-13 ENCOUNTER — Encounter (HOSPITAL_COMMUNITY): Payer: Self-pay | Admitting: Psychiatry

## 2014-09-13 VITALS — BP 161/66 | HR 77 | Ht <= 58 in | Wt 208.8 lb

## 2014-09-13 DIAGNOSIS — F331 Major depressive disorder, recurrent, moderate: Secondary | ICD-10-CM

## 2014-09-13 DIAGNOSIS — F329 Major depressive disorder, single episode, unspecified: Secondary | ICD-10-CM | POA: Diagnosis not present

## 2014-09-13 DIAGNOSIS — F411 Generalized anxiety disorder: Secondary | ICD-10-CM

## 2014-09-13 MED ORDER — SERTRALINE HCL 100 MG PO TABS: 100.0000 mg | ORAL_TABLET | Freq: Every day | ORAL | Status: DC

## 2014-09-13 MED ORDER — CLONAZEPAM 0.5 MG PO TABS: ORAL_TABLET | ORAL | Status: DC
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
## 2014-09-16 ENCOUNTER — Other Ambulatory Visit: Payer: Self-pay | Admitting: Cardiology

## 2014-10-08 ENCOUNTER — Other Ambulatory Visit: Payer: Self-pay | Admitting: *Deleted

## 2014-10-08 MED ORDER — NITROGLYCERIN 0.4 MG SL SUBL: SUBLINGUAL_TABLET | SUBLINGUAL | Status: DC

## 2014-11-01 ENCOUNTER — Encounter (HOSPITAL_COMMUNITY): Payer: Self-pay | Admitting: Psychiatry

## 2014-11-13 ENCOUNTER — Other Ambulatory Visit: Payer: Self-pay | Admitting: Cardiology

## 2014-12-03 ENCOUNTER — Other Ambulatory Visit: Payer: Self-pay | Admitting: Cardiology

## 2014-12-13 ENCOUNTER — Ambulatory Visit (HOSPITAL_COMMUNITY): Payer: Self-pay | Admitting: Psychiatry

## 2014-12-15 ENCOUNTER — Other Ambulatory Visit: Payer: Self-pay | Admitting: Cardiology

## 2014-12-22 ENCOUNTER — Ambulatory Visit (INDEPENDENT_AMBULATORY_CARE_PROVIDER_SITE_OTHER): Payer: Medicare Other | Admitting: Cardiology

## 2014-12-22 ENCOUNTER — Encounter: Payer: Self-pay | Admitting: Cardiology

## 2014-12-22 VITALS — BP 120/90 | HR 68 | Ht <= 58 in | Wt 210.0 lb

## 2014-12-22 DIAGNOSIS — Z951 Presence of aortocoronary bypass graft: Secondary | ICD-10-CM

## 2014-12-22 DIAGNOSIS — R011 Cardiac murmur, unspecified: Secondary | ICD-10-CM | POA: Insufficient documentation

## 2014-12-22 DIAGNOSIS — I2510 Atherosclerotic heart disease of native coronary artery without angina pectoris: Secondary | ICD-10-CM | POA: Diagnosis not present

## 2014-12-22 DIAGNOSIS — E785 Hyperlipidemia, unspecified: Secondary | ICD-10-CM

## 2014-12-22 DIAGNOSIS — I6523 Occlusion and stenosis of bilateral carotid arteries: Secondary | ICD-10-CM | POA: Diagnosis not present

## 2014-12-22 DIAGNOSIS — R010 Benign and innocent cardiac murmurs: Secondary | ICD-10-CM

## 2014-12-22 NOTE — Assessment & Plan Note (Signed)
She missed her appointment recently to see Dr. Oneida Alar concerning her carotid artery disease. We will help her arrange an appointment.

## 2014-12-22 NOTE — Progress Notes (Signed)
Cardiology Office Note   Date:  12/22/2014   ID:  Sherry Cox, DOB 16-Jul-1960, MRN [id number]  PCP:  Celedonio Savage, MD  Cardiologist:  Dola Argyle, MD   Chief Complaint  Patient presents with  . Appointment    Follow-up coronary disease      History of Present Illness: Sherry Cox is a 54 y.o. female who presents today to follow-up coronary artery disease. Her carotids are followed by Dr. Oneida Alar. She was recently hospitalized overnight at [facility] when it was noted that her white blood count was low. There was no cardiac workup and she was discharged home. She continues dialysis. She has not been having any significant chest pain.    Past Medical History  Diagnosis Date  . Overweight(278.02)   . Anxiety disorder   . Carotid artery stenosis     12/10/2008 doppler 0000000 RICA, 99991111 LICA/see evaluation by Dr. Oneida Alar 2011  . CAD (coronary artery disease)     Total RCA, stress echo [facility] 11/2008-no ischemia, EF 55%; h/o CABG  . Hyperlipidemia   . ESRD on dialysis     Transplant consideration  . Tobacco abuse   . Hx of CABG     Off pump LIMA to LAD  05/2006  . Ejection fraction     EF 60-65%, echo, June, 2009  . Murmur     October, 2012  . Neuropathy   . Anxiety   . Hx of tobacco use, presenting hazards to health 04/26/2012  . Irregular heart beat   . Ankle injury   . Hemochromatosis     Past Surgical History  Procedure Laterality Date  . Coronary artery bypass graft  05/2006    Off pump LIMA to LAD  . Inner ear surgery    . Plastic surgery on leg    . Cardiac valve surgery  2007  . Cholecystectomy  1995    Gall Bladder  . Carotid angiogram Bilateral 03/06/2013    Procedure: CAROTID ANGIOGRAM;  Surgeon: Elam Dutch, MD;  Location: [facility] CATH LAB;  Service: Cardiovascular;  Laterality: Bilateral;    Patient Active Problem List   Diagnosis Date Noted  . Hx of tobacco use, presenting hazards to health     Priority: High  . Hx of CABG    Priority: High  . Tobacco abuse 12/11/2013  . 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 Occlusion and stenosis of carotid artery without mention of cerebral infarction 08/30/2011  . Ejection fraction   . CAD (coronary artery disease)   . Carotid artery stenosis   . Hyperlipidemia   . ESRD on dialysis   . HYPOTHYROIDISM 09/26/2009  . Overweight(278.02) 06/09/2009  . ANXIETY 06/09/2009      Current Outpatient Prescriptions  Medication Sig Dispense Refill  . albuterol (PROAIR HFA) 108 (90 BASE) MCG/ACT inhaler Inhale 2 puffs into the lungs every 4 (four) hours as needed. For wheezing    . aspirin EC 81 MG tablet Take 1 tablet (81 mg total) by mouth daily.    . clonazePAM (KLONOPIN) 0.5 MG tablet Take one three times a day 90 tablet 2  . diclofenac (VOLTAREN) 75 MG EC tablet Take 75 mg by mouth 2 (two) times daily.    . folic acid (FOLVITE) 1 MG tablet Take 1 mg by mouth daily.    . folic acid-vitamin b complex-vitamin c-selenium-zinc (DIALYVITE) 3 MG TABS Take 1 tablet by mouth daily.      . furosemide (LASIX) 80 MG tablet Take  80 mg by mouth.    Marland Kitchen HYDROcodone-acetaminophen (VICODIN) 5-500 MG per tablet Take by mouth 2 (two) times daily.     Marland Kitchen levothyroxine (SYNTHROID, LEVOTHROID) 150 MCG tablet Take 1 tablet by mouth Daily.    Marland Kitchen LOPRESSOR 50 MG tablet TAKE 1 BY MOUTH TWO TIMES  DAILY ON MONDAY, WEDNESDAY AND FRIDAY TAKE 1 BY MOUTH ON ALL OTHER DAYS 40 tablet 3  . nitroGLYCERIN (NITROSTAT) 0.4 MG SL tablet PLACE 1 UNDER TONGUE EVERY 5 MINUTES FOR CHEST PAIN UP TO 3 DOSES. IF NOT RELIEVED AFTER 3RD DOSE, PROCEED TO THE ED FOR AN EVALUATION. 25 tablet 0  . NITROSTAT 0.4 MG SL tablet PLACE ONE TABLET UNDER THE TONGUE EVERY 5 MINUTES AS NEEDED FOR CHEST PAIN. IF NOT RELIEVED AFTER 3RD DOSE, PROCEED TO THE ED FOR AN EVALUAT 25 tablet 3  . sertraline (ZOLOFT) 100 MG tablet Take 1 tablet (100 mg total) by mouth daily. 90 tablet 3  . sevelamer (RENVELA) 800 MG tablet Take 4,000 mg by mouth 3  (three) times daily with meals. Take 5 tablets (4000mg ) by mouth with meals and 3 tablets by mouth with snacks.    . sodium bicarbonate 650 MG tablet Take 650 mg by mouth 2 (two) times daily.     Marland Kitchen sulfacetamide (BLEPH-10) 10 % ophthalmic solution Place 2 drops into both eyes daily as needed. For dry/itchy eyes    . topiramate (TOPAMAX) 25 MG tablet as needed.    Marland Kitchen ZOCOR 40 MG tablet TAKE 1 BY MOUTH DAILY AT   6 PM 30 tablet 5  . zolpidem (AMBIEN) 5 MG tablet Take 5 mg by mouth at bedtime as needed. For sleep     No current facility-administered medications for this visit.    Allergies:   Review of patient's allergies indicates no known allergies.    Social History:  The patient  reports that she has been smoking Cigarettes.  She has a 9 pack-year smoking history. She has never used smokeless tobacco. She reports that she does not drink alcohol or use illicit drugs.   Family History:  The patient's family history includes ADD / ADHD in her other; Alcohol abuse in her father and other; Bipolar disorder in her brother, other, and other; Dementia in her maternal aunt, mother, and sister; Depression in her sister; Diabetes in her brother and sister; Drug abuse in her brother; Heart attack in her father, mother, and sister; Heart disease in her brother, father, mother, and sister; Hypertension in her brother, father, mother, and sister; OCD in her sister and sister; Seizures in her other. There is no history of Paranoid behavior, Schizophrenia, Sexual abuse, or Physical abuse.    ROS:  Please see the history of present illness.     Patient denies fever, chills, headache, sweats, rash, change in vision, change in hearing, chest pain, cough, nausea or vomiting, urinary symptoms. All other systems are reviewed and are negative.   PHYSICAL EXAM: VS:  BP 120/90 mmHg  Pulse 68  Ht 4\' 9"  (1.448 m)  Wt 210 lb (95.255 kg)  BMI 45.43 kg/m2  SpO2 94% , Patient is overweight. She is oriented to person  time and place. Affect is normal. Head is atraumatic. Sclerae and conjunctivae are normal. There is no jugulovenous distention. There are carotid bruits. Lungs are clear. Respiratory effort is nonlabored. Cardiac exam reveals S1 and S2. There is a systolic murmur that has been heard in the past. Abdomen is soft. There is no peripheral edema.  Her shunt is in her left arm. There are no skin rashes.  EKG:   EKG is done today and reviewed by me. There is sinus rhythm with nonspecific ST-T wave changes. There is no change from the past.   Recent Labs: No results found for requested labs within last 365 days.    Lipid Panel No results found for: CHOL, TRIG, HDL, CHOLHDL, VLDL, LDLCALC, LDLDIRECT    Wt Readings from Last 3 Encounters:  12/22/14 210 lb (95.255 kg)  09/13/14 208 lb 12.8 oz (94.711 kg)  06/11/14 214 lb 12.8 oz (97.433 kg)      Current medicines are reviewed  The patient understands her medications.   ASSESSMENT AND PLAN:

## 2014-12-22 NOTE — Patient Instructions (Signed)
Your physician recommends that you continue on your current medications as directed. Please refer to the Current Medication list given to you today. Your physician recommends that you schedule a follow-up appointment in: 1 year. You will receive a reminder letter in the mail in about 10 months reminding you to call and schedule your appointment. If you don't receive this letter, please contact our office. Please reschedule your appointment with Dr. Oneida Alar at [facility].

## 2014-12-22 NOTE — Assessment & Plan Note (Signed)
The patient has a significant systolic murmur. This was evaluated in 2012. She had no significant valvular abnormalities. It was felt that this was a flow murmur. No further workup.

## 2014-12-22 NOTE — Assessment & Plan Note (Signed)
Coronary disease is stable. No further workup at this time. 

## 2014-12-22 NOTE — Assessment & Plan Note (Signed)
Her lipids are being treated. No change in therapy. 

## 2015-01-07 ENCOUNTER — Encounter (HOSPITAL_COMMUNITY): Payer: Self-pay | Admitting: Psychiatry

## 2015-01-07 ENCOUNTER — Ambulatory Visit (INDEPENDENT_AMBULATORY_CARE_PROVIDER_SITE_OTHER): Payer: Medicare Other | Admitting: Psychiatry

## 2015-01-07 VITALS — BP 168/71 | HR 75 | Ht <= 58 in | Wt 202.3 lb

## 2015-01-07 DIAGNOSIS — F331 Major depressive disorder, recurrent, moderate: Secondary | ICD-10-CM

## 2015-01-07 DIAGNOSIS — F329 Major depressive disorder, single episode, unspecified: Secondary | ICD-10-CM | POA: Diagnosis not present

## 2015-01-07 DIAGNOSIS — F411 Generalized anxiety disorder: Secondary | ICD-10-CM

## 2015-01-07 MED ORDER — CLONAZEPAM 0.5 MG PO TABS: ORAL_TABLET | ORAL | Status: DC

## 2015-01-07 MED ORDER — SERTRALINE HCL 100 MG PO TABS: 100.0000 mg | ORAL_TABLET | Freq: Every day | ORAL | Status: DC
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
## 2015-01-12 ENCOUNTER — Other Ambulatory Visit: Payer: Self-pay | Admitting: *Deleted

## 2015-01-12 DIAGNOSIS — I6523 Occlusion and stenosis of bilateral carotid arteries: Secondary | ICD-10-CM

## 2015-01-13 ENCOUNTER — Encounter (HOSPITAL_COMMUNITY): Payer: Self-pay

## 2015-01-18 ENCOUNTER — Encounter: Payer: Self-pay | Admitting: Vascular Surgery

## 2015-01-20 ENCOUNTER — Ambulatory Visit: Payer: Self-pay | Admitting: Vascular Surgery

## 2015-01-31 ENCOUNTER — Encounter: Payer: Self-pay | Admitting: Vascular Surgery

## 2015-02-02 ENCOUNTER — Other Ambulatory Visit: Payer: Self-pay | Admitting: Cardiology

## 2015-02-03 ENCOUNTER — Other Ambulatory Visit: Payer: Self-pay

## 2015-02-03 ENCOUNTER — Encounter: Payer: Self-pay | Admitting: Vascular Surgery

## 2015-02-03 ENCOUNTER — Ambulatory Visit (INDEPENDENT_AMBULATORY_CARE_PROVIDER_SITE_OTHER): Payer: Medicare Other | Admitting: Vascular Surgery

## 2015-02-03 ENCOUNTER — Ambulatory Visit (HOSPITAL_COMMUNITY)
Admission: RE | Admit: 2015-02-03 | Discharge: 2015-02-03 | Disposition: A | Discharge status: Home / Self Care | Payer: Medicare Other | Source: Ambulatory Visit | Attending: Vascular Surgery | Admitting: Vascular Surgery

## 2015-02-03 VITALS — BP 167/70 | HR 76 | Temp 98.9°F | Resp 18 | Ht <= 58 in | Wt 203.0 lb

## 2015-02-03 DIAGNOSIS — I6523 Occlusion and stenosis of bilateral carotid arteries: Secondary | ICD-10-CM

## 2015-02-03 MED ORDER — SIMVASTATIN 40 MG PO TABS: ORAL_TABLET | ORAL | Status: DC

## 2015-02-03 NOTE — Progress Notes (Signed)
[facility] [place]  Established Carotid Patient  History of Present Illness  Sherry Cox is a 54 y.o. (1961-07-08) female who presents for her carotid stenosis surveillance.     Previous carotid studies demonstrated: RICA 123456 stenosis, LICA [id number] stenosis.  Patient has no history of TIA or stroke symptom.  The patient has not had amaurosis fugax or monocular blindness.  The patient has not had facial drooping or hemiplegia.  The patient has not had receptive or expressive aphasia.   Other medical conditions include CAD and takes Asprin 81 mg daily, hypertension treated with lopressor and hypercholesterolemia Zocor. These are currently stable. She is on dialysis and dialyzes Tuesday Thursday Saturday at [facility].  Past Medical History  Diagnosis Date  . Overweight(278.02)   . Anxiety disorder   . Carotid artery stenosis     12/10/2008 doppler 0000000 RICA, 99991111 LICA/see evaluation by Dr. Oneida Alar 2011  . CAD (coronary artery disease)     Total RCA, stress echo [facility] 11/2008-no ischemia, EF 55%; h/o CABG  . Hyperlipidemia   . ESRD on dialysis     Transplant consideration  . Tobacco abuse   . Hx of CABG     Off pump LIMA to LAD  05/2006  . Ejection fraction     EF 60-65%, echo, June, 2009  . Murmur     October, 2012  . Neuropathy   . Anxiety   . Hx of tobacco use, presenting hazards to health 04/26/2012  . Irregular heart beat   . Ankle injury   . Hemochromatosis    Past Surgical History  Procedure Laterality Date  . Coronary artery bypass graft  05/2006    Off pump LIMA to LAD  . Inner ear surgery    . Plastic surgery on leg    . Cardiac valve surgery  2007  . Cholecystectomy  1995    Gall Bladder  . Carotid angiogram Bilateral 03/06/2013    Procedure: CAROTID ANGIOGRAM;  Surgeon: Elam Dutch, MD;  Location: [facility] CATH LAB;  Service: Cardiovascular;  Laterality: Bilateral;    On ROS today: Review of Systems  Constitutional: Negative for fever and  chills.  HENT: Negative for hearing loss and neck pain.   Eyes: Negative for blurred vision.  Respiratory: Negative for cough and hemoptysis.   Cardiovascular: Negative for chest pain and palpitations.  Gastrointestinal: Negative for heartburn, nausea and vomiting.  Genitourinary: Negative for dysuria.  Skin: Negative for itching and rash.  Neurological: Negative for dizziness, sensory change, focal weakness, weakness and headaches.  Psychiatric/Behavioral: Negative for depression.      Physical Examination    General: A&O x 3, WD,+ Obese Neck: Bilateral carotid bruits 2+ carotid pulses Pulmonary: Sym exp, good air movt, CTAB, no rales, rhonchi, & wheezing,   Cardiac: RRR, Nl S1, S2, no Murmurs, rubs or gallops,  Palpable left upper arm thrill fistula Neurologic: Symmetric upper extremity and lower extremity motor strength 5 over 5  Non-Invasive Vascular Imaging Bilateral 40-60% internal carotid artery stenosis unchanged from 2010  Assessment: Stable bilateral moderate carotid stenosis no significant change over the last 6 years  Plan: Follow-up carotid duplex scan in 1 year continue aspirin  Ruta Hinds, MD [facility] [place] Office: [phone] Pager: [phone]

## 2015-02-04 NOTE — Addendum Note (Signed)
Addended by: Mena Goes on: 02/04/2015 02:37 PM   Modules accepted: Orders

## 2015-03-05 ENCOUNTER — Other Ambulatory Visit: Payer: Self-pay | Admitting: Cardiology

## 2015-04-06 ENCOUNTER — Other Ambulatory Visit: Payer: Self-pay | Admitting: Cardiology

## 2015-04-11 ENCOUNTER — Other Ambulatory Visit (HOSPITAL_COMMUNITY): Payer: Self-pay | Admitting: Psychiatry

## 2015-04-13 ENCOUNTER — Other Ambulatory Visit: Payer: Self-pay | Admitting: Cardiology

## 2015-04-13 MED ORDER — NITROGLYCERIN 0.4 MG SL SUBL: SUBLINGUAL_TABLET | SUBLINGUAL | Status: DC

## 2015-04-15 ENCOUNTER — Telehealth: Payer: Self-pay | Admitting: Cardiology

## 2015-04-15 NOTE — Telephone Encounter (Signed)
[place] and talked to pharmacist. Pharmacist had a question about medication instructions on Nitroglycerin "PLACE 1 UNDER TONGUE EVERY 5 MINUTES FOR CHEST PAIN UP TO 3 DOSES. IF NOT RELIEVED AFTER 3RD DOSE, PROCEED TO THE ED FOR AN EVALUATION".  Pharmacist stated that the American Heart Association recommends patient to go to ED after 1st dose. Pharmacist stated we can leave it like it is. Decided to leave like it is, because doctor is not in the office to change order.

## 2015-04-15 NOTE — Telephone Encounter (Signed)
New Message       Office calling stating that they have questions about pt's most recent refill of Nitrostat. Please call back and advise.

## 2015-05-09 ENCOUNTER — Ambulatory Visit (INDEPENDENT_AMBULATORY_CARE_PROVIDER_SITE_OTHER): Payer: Medicare Other | Admitting: Psychiatry

## 2015-05-09 ENCOUNTER — Encounter (HOSPITAL_COMMUNITY): Payer: Self-pay | Admitting: Psychiatry

## 2015-05-09 VITALS — BP 173/100 | HR 77 | Ht <= 58 in | Wt 204.8 lb

## 2015-05-09 DIAGNOSIS — I6523 Occlusion and stenosis of bilateral carotid arteries: Secondary | ICD-10-CM

## 2015-05-09 DIAGNOSIS — F411 Generalized anxiety disorder: Secondary | ICD-10-CM

## 2015-05-09 DIAGNOSIS — F331 Major depressive disorder, recurrent, moderate: Secondary | ICD-10-CM

## 2015-05-09 MED ORDER — SERTRALINE HCL 100 MG PO TABS: 100.0000 mg | ORAL_TABLET | Freq: Every day | ORAL | Status: DC

## 2015-05-09 MED ORDER — CLONAZEPAM 0.5 MG PO TABS: 0.5000 mg | ORAL_TABLET | Freq: Four times a day (QID) | ORAL | Status: DC
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
## 2015-06-09 ENCOUNTER — Other Ambulatory Visit: Payer: Self-pay | Admitting: Cardiology

## 2015-06-10 ENCOUNTER — Telehealth: Payer: Self-pay | Admitting: Cardiology

## 2015-06-10 MED ORDER — NITROGLYCERIN 0.4 MG SL SUBL: 0.4000 mg | SUBLINGUAL_TABLET | SUBLINGUAL | Status: DC | PRN

## 2015-06-10 NOTE — Telephone Encounter (Signed)
Refill complete 

## 2015-06-10 NOTE — Telephone Encounter (Signed)
NITROSTAT 0.4 MG SL tablet  Patient lost nitro and [facility] will not fill without another prescription. She would like called into [facility]

## 2015-06-15 ENCOUNTER — Other Ambulatory Visit: Payer: Self-pay | Admitting: Cardiology

## 2015-07-12 DIAGNOSIS — D631 Anemia in chronic kidney disease: Secondary | ICD-10-CM | POA: Diagnosis not present

## 2015-07-12 DIAGNOSIS — N186 End stage renal disease: Secondary | ICD-10-CM | POA: Diagnosis not present

## 2015-07-12 DIAGNOSIS — Z992 Dependence on renal dialysis: Secondary | ICD-10-CM | POA: Diagnosis not present

## 2015-07-12 DIAGNOSIS — D509 Iron deficiency anemia, unspecified: Secondary | ICD-10-CM | POA: Diagnosis not present

## 2015-07-12 DIAGNOSIS — N2581 Secondary hyperparathyroidism of renal origin: Secondary | ICD-10-CM | POA: Diagnosis not present

## 2015-07-14 DIAGNOSIS — D509 Iron deficiency anemia, unspecified: Secondary | ICD-10-CM | POA: Diagnosis not present

## 2015-07-14 DIAGNOSIS — N186 End stage renal disease: Secondary | ICD-10-CM | POA: Diagnosis not present

## 2015-07-14 DIAGNOSIS — D631 Anemia in chronic kidney disease: Secondary | ICD-10-CM | POA: Diagnosis not present

## 2015-07-14 DIAGNOSIS — Z992 Dependence on renal dialysis: Secondary | ICD-10-CM | POA: Diagnosis not present

## 2015-07-14 DIAGNOSIS — N2581 Secondary hyperparathyroidism of renal origin: Secondary | ICD-10-CM | POA: Diagnosis not present

## 2015-07-19 DIAGNOSIS — N186 End stage renal disease: Secondary | ICD-10-CM | POA: Diagnosis not present

## 2015-07-19 DIAGNOSIS — D631 Anemia in chronic kidney disease: Secondary | ICD-10-CM | POA: Diagnosis not present

## 2015-07-19 DIAGNOSIS — D509 Iron deficiency anemia, unspecified: Secondary | ICD-10-CM | POA: Diagnosis not present

## 2015-07-19 DIAGNOSIS — Z992 Dependence on renal dialysis: Secondary | ICD-10-CM | POA: Diagnosis not present

## 2015-07-19 DIAGNOSIS — N2581 Secondary hyperparathyroidism of renal origin: Secondary | ICD-10-CM | POA: Diagnosis not present

## 2015-07-21 DIAGNOSIS — N186 End stage renal disease: Secondary | ICD-10-CM | POA: Diagnosis not present

## 2015-07-21 DIAGNOSIS — N2581 Secondary hyperparathyroidism of renal origin: Secondary | ICD-10-CM | POA: Diagnosis not present

## 2015-07-21 DIAGNOSIS — D509 Iron deficiency anemia, unspecified: Secondary | ICD-10-CM | POA: Diagnosis not present

## 2015-07-21 DIAGNOSIS — Z992 Dependence on renal dialysis: Secondary | ICD-10-CM | POA: Diagnosis not present

## 2015-07-21 DIAGNOSIS — D631 Anemia in chronic kidney disease: Secondary | ICD-10-CM | POA: Diagnosis not present

## 2015-07-23 DIAGNOSIS — N186 End stage renal disease: Secondary | ICD-10-CM | POA: Diagnosis not present

## 2015-07-23 DIAGNOSIS — D631 Anemia in chronic kidney disease: Secondary | ICD-10-CM | POA: Diagnosis not present

## 2015-07-23 DIAGNOSIS — N2581 Secondary hyperparathyroidism of renal origin: Secondary | ICD-10-CM | POA: Diagnosis not present

## 2015-07-23 DIAGNOSIS — D509 Iron deficiency anemia, unspecified: Secondary | ICD-10-CM | POA: Diagnosis not present

## 2015-07-23 DIAGNOSIS — Z992 Dependence on renal dialysis: Secondary | ICD-10-CM | POA: Diagnosis not present

## 2015-07-25 DIAGNOSIS — M545 Low back pain: Secondary | ICD-10-CM | POA: Diagnosis not present

## 2015-07-25 DIAGNOSIS — M5416 Radiculopathy, lumbar region: Secondary | ICD-10-CM | POA: Diagnosis not present

## 2015-07-25 DIAGNOSIS — R209 Unspecified disturbances of skin sensation: Secondary | ICD-10-CM | POA: Diagnosis not present

## 2015-07-25 DIAGNOSIS — F419 Anxiety disorder, unspecified: Secondary | ICD-10-CM | POA: Diagnosis not present

## 2015-07-25 DIAGNOSIS — E785 Hyperlipidemia, unspecified: Secondary | ICD-10-CM | POA: Diagnosis not present

## 2015-07-25 DIAGNOSIS — G4459 Other complicated headache syndrome: Secondary | ICD-10-CM | POA: Diagnosis not present

## 2015-07-26 DIAGNOSIS — Z992 Dependence on renal dialysis: Secondary | ICD-10-CM | POA: Diagnosis not present

## 2015-07-26 DIAGNOSIS — N186 End stage renal disease: Secondary | ICD-10-CM | POA: Diagnosis not present

## 2015-07-26 DIAGNOSIS — N2581 Secondary hyperparathyroidism of renal origin: Secondary | ICD-10-CM | POA: Diagnosis not present

## 2015-07-26 DIAGNOSIS — D509 Iron deficiency anemia, unspecified: Secondary | ICD-10-CM | POA: Diagnosis not present

## 2015-07-26 DIAGNOSIS — D631 Anemia in chronic kidney disease: Secondary | ICD-10-CM | POA: Diagnosis not present

## 2015-07-28 DIAGNOSIS — D631 Anemia in chronic kidney disease: Secondary | ICD-10-CM | POA: Diagnosis not present

## 2015-07-28 DIAGNOSIS — D509 Iron deficiency anemia, unspecified: Secondary | ICD-10-CM | POA: Diagnosis not present

## 2015-07-28 DIAGNOSIS — Z992 Dependence on renal dialysis: Secondary | ICD-10-CM | POA: Diagnosis not present

## 2015-07-28 DIAGNOSIS — N2581 Secondary hyperparathyroidism of renal origin: Secondary | ICD-10-CM | POA: Diagnosis not present

## 2015-07-28 DIAGNOSIS — N186 End stage renal disease: Secondary | ICD-10-CM | POA: Diagnosis not present

## 2015-07-30 DIAGNOSIS — Z992 Dependence on renal dialysis: Secondary | ICD-10-CM | POA: Diagnosis not present

## 2015-07-30 DIAGNOSIS — D631 Anemia in chronic kidney disease: Secondary | ICD-10-CM | POA: Diagnosis not present

## 2015-07-30 DIAGNOSIS — N186 End stage renal disease: Secondary | ICD-10-CM | POA: Diagnosis not present

## 2015-07-30 DIAGNOSIS — N2581 Secondary hyperparathyroidism of renal origin: Secondary | ICD-10-CM | POA: Diagnosis not present

## 2015-07-30 DIAGNOSIS — D509 Iron deficiency anemia, unspecified: Secondary | ICD-10-CM | POA: Diagnosis not present

## 2015-08-02 DIAGNOSIS — N2581 Secondary hyperparathyroidism of renal origin: Secondary | ICD-10-CM | POA: Diagnosis not present

## 2015-08-02 DIAGNOSIS — N186 End stage renal disease: Secondary | ICD-10-CM | POA: Diagnosis not present

## 2015-08-02 DIAGNOSIS — Z992 Dependence on renal dialysis: Secondary | ICD-10-CM | POA: Diagnosis not present

## 2015-08-02 DIAGNOSIS — D631 Anemia in chronic kidney disease: Secondary | ICD-10-CM | POA: Diagnosis not present

## 2015-08-02 DIAGNOSIS — D509 Iron deficiency anemia, unspecified: Secondary | ICD-10-CM | POA: Diagnosis not present

## 2015-08-04 DIAGNOSIS — N2581 Secondary hyperparathyroidism of renal origin: Secondary | ICD-10-CM | POA: Diagnosis not present

## 2015-08-04 DIAGNOSIS — D509 Iron deficiency anemia, unspecified: Secondary | ICD-10-CM | POA: Diagnosis not present

## 2015-08-04 DIAGNOSIS — Z992 Dependence on renal dialysis: Secondary | ICD-10-CM | POA: Diagnosis not present

## 2015-08-04 DIAGNOSIS — D631 Anemia in chronic kidney disease: Secondary | ICD-10-CM | POA: Diagnosis not present

## 2015-08-04 DIAGNOSIS — N186 End stage renal disease: Secondary | ICD-10-CM | POA: Diagnosis not present

## 2015-08-06 DIAGNOSIS — D509 Iron deficiency anemia, unspecified: Secondary | ICD-10-CM | POA: Diagnosis not present

## 2015-08-06 DIAGNOSIS — N2581 Secondary hyperparathyroidism of renal origin: Secondary | ICD-10-CM | POA: Diagnosis not present

## 2015-08-06 DIAGNOSIS — Z992 Dependence on renal dialysis: Secondary | ICD-10-CM | POA: Diagnosis not present

## 2015-08-06 DIAGNOSIS — N186 End stage renal disease: Secondary | ICD-10-CM | POA: Diagnosis not present

## 2015-08-06 DIAGNOSIS — D631 Anemia in chronic kidney disease: Secondary | ICD-10-CM | POA: Diagnosis not present

## 2015-08-08 ENCOUNTER — Encounter (HOSPITAL_COMMUNITY): Payer: Self-pay | Admitting: Psychiatry

## 2015-08-08 ENCOUNTER — Ambulatory Visit (INDEPENDENT_AMBULATORY_CARE_PROVIDER_SITE_OTHER): Payer: Medicare Other | Admitting: Psychiatry

## 2015-08-08 VITALS — BP 158/68 | HR 81 | Ht <= 58 in | Wt 212.6 lb

## 2015-08-08 DIAGNOSIS — F331 Major depressive disorder, recurrent, moderate: Secondary | ICD-10-CM | POA: Diagnosis not present

## 2015-08-08 DIAGNOSIS — F411 Generalized anxiety disorder: Secondary | ICD-10-CM | POA: Diagnosis not present

## 2015-08-08 MED ORDER — SERTRALINE HCL 100 MG PO TABS: 100.0000 mg | ORAL_TABLET | Freq: Every day | ORAL | Status: DC

## 2015-08-08 MED ORDER — CLONAZEPAM 0.5 MG PO TABS: 0.5000 mg | ORAL_TABLET | Freq: Four times a day (QID) | ORAL | Status: DC
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
## 2015-08-09 DIAGNOSIS — D509 Iron deficiency anemia, unspecified: Secondary | ICD-10-CM | POA: Diagnosis not present

## 2015-08-09 DIAGNOSIS — Z992 Dependence on renal dialysis: Secondary | ICD-10-CM | POA: Diagnosis not present

## 2015-08-09 DIAGNOSIS — D631 Anemia in chronic kidney disease: Secondary | ICD-10-CM | POA: Diagnosis not present

## 2015-08-09 DIAGNOSIS — N2581 Secondary hyperparathyroidism of renal origin: Secondary | ICD-10-CM | POA: Diagnosis not present

## 2015-08-09 DIAGNOSIS — N186 End stage renal disease: Secondary | ICD-10-CM | POA: Diagnosis not present

## 2015-08-11 DIAGNOSIS — D631 Anemia in chronic kidney disease: Secondary | ICD-10-CM | POA: Diagnosis not present

## 2015-08-11 DIAGNOSIS — Z992 Dependence on renal dialysis: Secondary | ICD-10-CM | POA: Diagnosis not present

## 2015-08-11 DIAGNOSIS — R110 Nausea: Secondary | ICD-10-CM | POA: Diagnosis not present

## 2015-08-11 DIAGNOSIS — N186 End stage renal disease: Secondary | ICD-10-CM | POA: Diagnosis not present

## 2015-08-11 DIAGNOSIS — D509 Iron deficiency anemia, unspecified: Secondary | ICD-10-CM | POA: Diagnosis not present

## 2015-08-11 DIAGNOSIS — N2581 Secondary hyperparathyroidism of renal origin: Secondary | ICD-10-CM | POA: Diagnosis not present

## 2015-08-13 DIAGNOSIS — D631 Anemia in chronic kidney disease: Secondary | ICD-10-CM | POA: Diagnosis not present

## 2015-08-13 DIAGNOSIS — Z992 Dependence on renal dialysis: Secondary | ICD-10-CM | POA: Diagnosis not present

## 2015-08-13 DIAGNOSIS — N186 End stage renal disease: Secondary | ICD-10-CM | POA: Diagnosis not present

## 2015-08-13 DIAGNOSIS — R110 Nausea: Secondary | ICD-10-CM | POA: Diagnosis not present

## 2015-08-13 DIAGNOSIS — D509 Iron deficiency anemia, unspecified: Secondary | ICD-10-CM | POA: Diagnosis not present

## 2015-08-13 DIAGNOSIS — N2581 Secondary hyperparathyroidism of renal origin: Secondary | ICD-10-CM | POA: Diagnosis not present

## 2015-08-15 DIAGNOSIS — Z1231 Encounter for screening mammogram for malignant neoplasm of breast: Secondary | ICD-10-CM | POA: Diagnosis not present

## 2015-08-15 DIAGNOSIS — J0110 Acute frontal sinusitis, unspecified: Secondary | ICD-10-CM | POA: Diagnosis not present

## 2015-08-15 DIAGNOSIS — E7801 Familial hypercholesterolemia: Secondary | ICD-10-CM | POA: Diagnosis not present

## 2015-08-15 DIAGNOSIS — Z124 Encounter for screening for malignant neoplasm of cervix: Secondary | ICD-10-CM | POA: Diagnosis not present

## 2015-08-15 DIAGNOSIS — Z23 Encounter for immunization: Secondary | ICD-10-CM | POA: Diagnosis not present

## 2015-08-15 DIAGNOSIS — Z1211 Encounter for screening for malignant neoplasm of colon: Secondary | ICD-10-CM | POA: Diagnosis not present

## 2015-08-15 DIAGNOSIS — I10 Essential (primary) hypertension: Secondary | ICD-10-CM | POA: Diagnosis not present

## 2015-08-15 DIAGNOSIS — E559 Vitamin D deficiency, unspecified: Secondary | ICD-10-CM | POA: Diagnosis not present

## 2015-08-15 DIAGNOSIS — Z0000 Encounter for general adult medical examination without abnormal findings: Secondary | ICD-10-CM | POA: Diagnosis not present

## 2015-08-16 DIAGNOSIS — D631 Anemia in chronic kidney disease: Secondary | ICD-10-CM | POA: Diagnosis not present

## 2015-08-16 DIAGNOSIS — N2581 Secondary hyperparathyroidism of renal origin: Secondary | ICD-10-CM | POA: Diagnosis not present

## 2015-08-16 DIAGNOSIS — N186 End stage renal disease: Secondary | ICD-10-CM | POA: Diagnosis not present

## 2015-08-16 DIAGNOSIS — Z992 Dependence on renal dialysis: Secondary | ICD-10-CM | POA: Diagnosis not present

## 2015-08-16 DIAGNOSIS — R110 Nausea: Secondary | ICD-10-CM | POA: Diagnosis not present

## 2015-08-16 DIAGNOSIS — D509 Iron deficiency anemia, unspecified: Secondary | ICD-10-CM | POA: Diagnosis not present

## 2015-08-18 DIAGNOSIS — D509 Iron deficiency anemia, unspecified: Secondary | ICD-10-CM | POA: Diagnosis not present

## 2015-08-18 DIAGNOSIS — D631 Anemia in chronic kidney disease: Secondary | ICD-10-CM | POA: Diagnosis not present

## 2015-08-18 DIAGNOSIS — N186 End stage renal disease: Secondary | ICD-10-CM | POA: Diagnosis not present

## 2015-08-18 DIAGNOSIS — Z992 Dependence on renal dialysis: Secondary | ICD-10-CM | POA: Diagnosis not present

## 2015-08-18 DIAGNOSIS — R110 Nausea: Secondary | ICD-10-CM | POA: Diagnosis not present

## 2015-08-18 DIAGNOSIS — N2581 Secondary hyperparathyroidism of renal origin: Secondary | ICD-10-CM | POA: Diagnosis not present

## 2015-08-20 DIAGNOSIS — R110 Nausea: Secondary | ICD-10-CM | POA: Diagnosis not present

## 2015-08-20 DIAGNOSIS — D631 Anemia in chronic kidney disease: Secondary | ICD-10-CM | POA: Diagnosis not present

## 2015-08-20 DIAGNOSIS — D509 Iron deficiency anemia, unspecified: Secondary | ICD-10-CM | POA: Diagnosis not present

## 2015-08-20 DIAGNOSIS — N2581 Secondary hyperparathyroidism of renal origin: Secondary | ICD-10-CM | POA: Diagnosis not present

## 2015-08-20 DIAGNOSIS — Z992 Dependence on renal dialysis: Secondary | ICD-10-CM | POA: Diagnosis not present

## 2015-08-20 DIAGNOSIS — N186 End stage renal disease: Secondary | ICD-10-CM | POA: Diagnosis not present

## 2015-08-23 DIAGNOSIS — N2581 Secondary hyperparathyroidism of renal origin: Secondary | ICD-10-CM | POA: Diagnosis not present

## 2015-08-23 DIAGNOSIS — Z992 Dependence on renal dialysis: Secondary | ICD-10-CM | POA: Diagnosis not present

## 2015-08-23 DIAGNOSIS — D631 Anemia in chronic kidney disease: Secondary | ICD-10-CM | POA: Diagnosis not present

## 2015-08-23 DIAGNOSIS — D509 Iron deficiency anemia, unspecified: Secondary | ICD-10-CM | POA: Diagnosis not present

## 2015-08-23 DIAGNOSIS — N186 End stage renal disease: Secondary | ICD-10-CM | POA: Diagnosis not present

## 2015-08-23 DIAGNOSIS — R110 Nausea: Secondary | ICD-10-CM | POA: Diagnosis not present

## 2015-08-25 DIAGNOSIS — Z992 Dependence on renal dialysis: Secondary | ICD-10-CM | POA: Diagnosis not present

## 2015-08-25 DIAGNOSIS — D509 Iron deficiency anemia, unspecified: Secondary | ICD-10-CM | POA: Diagnosis not present

## 2015-08-25 DIAGNOSIS — N186 End stage renal disease: Secondary | ICD-10-CM | POA: Diagnosis not present

## 2015-08-25 DIAGNOSIS — D631 Anemia in chronic kidney disease: Secondary | ICD-10-CM | POA: Diagnosis not present

## 2015-08-25 DIAGNOSIS — N2581 Secondary hyperparathyroidism of renal origin: Secondary | ICD-10-CM | POA: Diagnosis not present

## 2015-08-25 DIAGNOSIS — R110 Nausea: Secondary | ICD-10-CM | POA: Diagnosis not present

## 2015-08-27 DIAGNOSIS — D631 Anemia in chronic kidney disease: Secondary | ICD-10-CM | POA: Diagnosis not present

## 2015-08-27 DIAGNOSIS — R110 Nausea: Secondary | ICD-10-CM | POA: Diagnosis not present

## 2015-08-27 DIAGNOSIS — N2581 Secondary hyperparathyroidism of renal origin: Secondary | ICD-10-CM | POA: Diagnosis not present

## 2015-08-27 DIAGNOSIS — Z992 Dependence on renal dialysis: Secondary | ICD-10-CM | POA: Diagnosis not present

## 2015-08-27 DIAGNOSIS — D509 Iron deficiency anemia, unspecified: Secondary | ICD-10-CM | POA: Diagnosis not present

## 2015-08-27 DIAGNOSIS — N186 End stage renal disease: Secondary | ICD-10-CM | POA: Diagnosis not present

## 2015-08-29 ENCOUNTER — Other Ambulatory Visit: Payer: Self-pay | Admitting: *Deleted

## 2015-08-29 DIAGNOSIS — Z992 Dependence on renal dialysis: Secondary | ICD-10-CM | POA: Diagnosis not present

## 2015-08-29 DIAGNOSIS — E785 Hyperlipidemia, unspecified: Secondary | ICD-10-CM | POA: Diagnosis not present

## 2015-08-29 DIAGNOSIS — J45909 Unspecified asthma, uncomplicated: Secondary | ICD-10-CM | POA: Diagnosis not present

## 2015-08-29 DIAGNOSIS — M6281 Muscle weakness (generalized): Secondary | ICD-10-CM | POA: Diagnosis not present

## 2015-08-29 DIAGNOSIS — M797 Fibromyalgia: Secondary | ICD-10-CM | POA: Diagnosis not present

## 2015-08-29 DIAGNOSIS — I2510 Atherosclerotic heart disease of native coronary artery without angina pectoris: Secondary | ICD-10-CM | POA: Diagnosis not present

## 2015-08-29 DIAGNOSIS — N186 End stage renal disease: Secondary | ICD-10-CM | POA: Diagnosis not present

## 2015-08-29 DIAGNOSIS — I120 Hypertensive chronic kidney disease with stage 5 chronic kidney disease or end stage renal disease: Secondary | ICD-10-CM | POA: Diagnosis not present

## 2015-08-29 MED ORDER — NITROGLYCERIN 0.4 MG SL SUBL: SUBLINGUAL_TABLET | SUBLINGUAL | Status: DC

## 2015-08-30 DIAGNOSIS — Z992 Dependence on renal dialysis: Secondary | ICD-10-CM | POA: Diagnosis not present

## 2015-08-30 DIAGNOSIS — D631 Anemia in chronic kidney disease: Secondary | ICD-10-CM | POA: Diagnosis not present

## 2015-08-30 DIAGNOSIS — D509 Iron deficiency anemia, unspecified: Secondary | ICD-10-CM | POA: Diagnosis not present

## 2015-08-30 DIAGNOSIS — N2581 Secondary hyperparathyroidism of renal origin: Secondary | ICD-10-CM | POA: Diagnosis not present

## 2015-08-30 DIAGNOSIS — R110 Nausea: Secondary | ICD-10-CM | POA: Diagnosis not present

## 2015-08-30 DIAGNOSIS — N186 End stage renal disease: Secondary | ICD-10-CM | POA: Diagnosis not present

## 2015-09-01 DIAGNOSIS — Z992 Dependence on renal dialysis: Secondary | ICD-10-CM | POA: Diagnosis not present

## 2015-09-01 DIAGNOSIS — D631 Anemia in chronic kidney disease: Secondary | ICD-10-CM | POA: Diagnosis not present

## 2015-09-01 DIAGNOSIS — D509 Iron deficiency anemia, unspecified: Secondary | ICD-10-CM | POA: Diagnosis not present

## 2015-09-01 DIAGNOSIS — R110 Nausea: Secondary | ICD-10-CM | POA: Diagnosis not present

## 2015-09-01 DIAGNOSIS — N2581 Secondary hyperparathyroidism of renal origin: Secondary | ICD-10-CM | POA: Diagnosis not present

## 2015-09-01 DIAGNOSIS — N186 End stage renal disease: Secondary | ICD-10-CM | POA: Diagnosis not present

## 2015-09-03 DIAGNOSIS — D631 Anemia in chronic kidney disease: Secondary | ICD-10-CM | POA: Diagnosis not present

## 2015-09-03 DIAGNOSIS — R110 Nausea: Secondary | ICD-10-CM | POA: Diagnosis not present

## 2015-09-03 DIAGNOSIS — D509 Iron deficiency anemia, unspecified: Secondary | ICD-10-CM | POA: Diagnosis not present

## 2015-09-03 DIAGNOSIS — N2581 Secondary hyperparathyroidism of renal origin: Secondary | ICD-10-CM | POA: Diagnosis not present

## 2015-09-03 DIAGNOSIS — Z992 Dependence on renal dialysis: Secondary | ICD-10-CM | POA: Diagnosis not present

## 2015-09-03 DIAGNOSIS — N186 End stage renal disease: Secondary | ICD-10-CM | POA: Diagnosis not present

## 2015-09-06 DIAGNOSIS — R110 Nausea: Secondary | ICD-10-CM | POA: Diagnosis not present

## 2015-09-06 DIAGNOSIS — D509 Iron deficiency anemia, unspecified: Secondary | ICD-10-CM | POA: Diagnosis not present

## 2015-09-06 DIAGNOSIS — N186 End stage renal disease: Secondary | ICD-10-CM | POA: Diagnosis not present

## 2015-09-06 DIAGNOSIS — N2581 Secondary hyperparathyroidism of renal origin: Secondary | ICD-10-CM | POA: Diagnosis not present

## 2015-09-06 DIAGNOSIS — D631 Anemia in chronic kidney disease: Secondary | ICD-10-CM | POA: Diagnosis not present

## 2015-09-06 DIAGNOSIS — Z992 Dependence on renal dialysis: Secondary | ICD-10-CM | POA: Diagnosis not present

## 2015-09-07 DIAGNOSIS — I120 Hypertensive chronic kidney disease with stage 5 chronic kidney disease or end stage renal disease: Secondary | ICD-10-CM | POA: Diagnosis not present

## 2015-09-07 DIAGNOSIS — I2510 Atherosclerotic heart disease of native coronary artery without angina pectoris: Secondary | ICD-10-CM | POA: Diagnosis not present

## 2015-09-07 DIAGNOSIS — J45909 Unspecified asthma, uncomplicated: Secondary | ICD-10-CM | POA: Diagnosis not present

## 2015-09-07 DIAGNOSIS — M6281 Muscle weakness (generalized): Secondary | ICD-10-CM | POA: Diagnosis not present

## 2015-09-07 DIAGNOSIS — M797 Fibromyalgia: Secondary | ICD-10-CM | POA: Diagnosis not present

## 2015-09-07 DIAGNOSIS — N186 End stage renal disease: Secondary | ICD-10-CM | POA: Diagnosis not present

## 2015-09-08 DIAGNOSIS — N2581 Secondary hyperparathyroidism of renal origin: Secondary | ICD-10-CM | POA: Diagnosis not present

## 2015-09-08 DIAGNOSIS — N186 End stage renal disease: Secondary | ICD-10-CM | POA: Diagnosis not present

## 2015-09-08 DIAGNOSIS — Z992 Dependence on renal dialysis: Secondary | ICD-10-CM | POA: Diagnosis not present

## 2015-09-08 DIAGNOSIS — D509 Iron deficiency anemia, unspecified: Secondary | ICD-10-CM | POA: Diagnosis not present

## 2015-09-08 DIAGNOSIS — D631 Anemia in chronic kidney disease: Secondary | ICD-10-CM | POA: Diagnosis not present

## 2015-09-10 DIAGNOSIS — Z992 Dependence on renal dialysis: Secondary | ICD-10-CM | POA: Diagnosis not present

## 2015-09-10 DIAGNOSIS — D509 Iron deficiency anemia, unspecified: Secondary | ICD-10-CM | POA: Diagnosis not present

## 2015-09-10 DIAGNOSIS — N2581 Secondary hyperparathyroidism of renal origin: Secondary | ICD-10-CM | POA: Diagnosis not present

## 2015-09-10 DIAGNOSIS — D631 Anemia in chronic kidney disease: Secondary | ICD-10-CM | POA: Diagnosis not present

## 2015-09-10 DIAGNOSIS — N186 End stage renal disease: Secondary | ICD-10-CM | POA: Diagnosis not present

## 2015-09-12 DIAGNOSIS — J45909 Unspecified asthma, uncomplicated: Secondary | ICD-10-CM | POA: Diagnosis not present

## 2015-09-12 DIAGNOSIS — I120 Hypertensive chronic kidney disease with stage 5 chronic kidney disease or end stage renal disease: Secondary | ICD-10-CM | POA: Diagnosis not present

## 2015-09-12 DIAGNOSIS — M6281 Muscle weakness (generalized): Secondary | ICD-10-CM | POA: Diagnosis not present

## 2015-09-12 DIAGNOSIS — N186 End stage renal disease: Secondary | ICD-10-CM | POA: Diagnosis not present

## 2015-09-12 DIAGNOSIS — M797 Fibromyalgia: Secondary | ICD-10-CM | POA: Diagnosis not present

## 2015-09-12 DIAGNOSIS — I2510 Atherosclerotic heart disease of native coronary artery without angina pectoris: Secondary | ICD-10-CM | POA: Diagnosis not present

## 2015-09-13 DIAGNOSIS — D631 Anemia in chronic kidney disease: Secondary | ICD-10-CM | POA: Diagnosis not present

## 2015-09-13 DIAGNOSIS — Z992 Dependence on renal dialysis: Secondary | ICD-10-CM | POA: Diagnosis not present

## 2015-09-13 DIAGNOSIS — N2581 Secondary hyperparathyroidism of renal origin: Secondary | ICD-10-CM | POA: Diagnosis not present

## 2015-09-13 DIAGNOSIS — D509 Iron deficiency anemia, unspecified: Secondary | ICD-10-CM | POA: Diagnosis not present

## 2015-09-13 DIAGNOSIS — N186 End stage renal disease: Secondary | ICD-10-CM | POA: Diagnosis not present

## 2015-09-14 DIAGNOSIS — M797 Fibromyalgia: Secondary | ICD-10-CM | POA: Diagnosis not present

## 2015-09-14 DIAGNOSIS — M6281 Muscle weakness (generalized): Secondary | ICD-10-CM | POA: Diagnosis not present

## 2015-09-14 DIAGNOSIS — J45909 Unspecified asthma, uncomplicated: Secondary | ICD-10-CM | POA: Diagnosis not present

## 2015-09-14 DIAGNOSIS — N186 End stage renal disease: Secondary | ICD-10-CM | POA: Diagnosis not present

## 2015-09-14 DIAGNOSIS — I2510 Atherosclerotic heart disease of native coronary artery without angina pectoris: Secondary | ICD-10-CM | POA: Diagnosis not present

## 2015-09-14 DIAGNOSIS — I120 Hypertensive chronic kidney disease with stage 5 chronic kidney disease or end stage renal disease: Secondary | ICD-10-CM | POA: Diagnosis not present

## 2015-09-15 DIAGNOSIS — N2581 Secondary hyperparathyroidism of renal origin: Secondary | ICD-10-CM | POA: Diagnosis not present

## 2015-09-15 DIAGNOSIS — D509 Iron deficiency anemia, unspecified: Secondary | ICD-10-CM | POA: Diagnosis not present

## 2015-09-15 DIAGNOSIS — D631 Anemia in chronic kidney disease: Secondary | ICD-10-CM | POA: Diagnosis not present

## 2015-09-15 DIAGNOSIS — Z992 Dependence on renal dialysis: Secondary | ICD-10-CM | POA: Diagnosis not present

## 2015-09-15 DIAGNOSIS — N186 End stage renal disease: Secondary | ICD-10-CM | POA: Diagnosis not present

## 2015-09-17 DIAGNOSIS — D631 Anemia in chronic kidney disease: Secondary | ICD-10-CM | POA: Diagnosis not present

## 2015-09-17 DIAGNOSIS — N186 End stage renal disease: Secondary | ICD-10-CM | POA: Diagnosis not present

## 2015-09-17 DIAGNOSIS — N2581 Secondary hyperparathyroidism of renal origin: Secondary | ICD-10-CM | POA: Diagnosis not present

## 2015-09-17 DIAGNOSIS — Z992 Dependence on renal dialysis: Secondary | ICD-10-CM | POA: Diagnosis not present

## 2015-09-17 DIAGNOSIS — D509 Iron deficiency anemia, unspecified: Secondary | ICD-10-CM | POA: Diagnosis not present

## 2015-09-20 DIAGNOSIS — N186 End stage renal disease: Secondary | ICD-10-CM | POA: Diagnosis not present

## 2015-09-20 DIAGNOSIS — Z992 Dependence on renal dialysis: Secondary | ICD-10-CM | POA: Diagnosis not present

## 2015-09-20 DIAGNOSIS — D631 Anemia in chronic kidney disease: Secondary | ICD-10-CM | POA: Diagnosis not present

## 2015-09-20 DIAGNOSIS — N2581 Secondary hyperparathyroidism of renal origin: Secondary | ICD-10-CM | POA: Diagnosis not present

## 2015-09-20 DIAGNOSIS — D509 Iron deficiency anemia, unspecified: Secondary | ICD-10-CM | POA: Diagnosis not present

## 2015-09-22 DIAGNOSIS — D509 Iron deficiency anemia, unspecified: Secondary | ICD-10-CM | POA: Diagnosis not present

## 2015-09-22 DIAGNOSIS — D631 Anemia in chronic kidney disease: Secondary | ICD-10-CM | POA: Diagnosis not present

## 2015-09-22 DIAGNOSIS — N2581 Secondary hyperparathyroidism of renal origin: Secondary | ICD-10-CM | POA: Diagnosis not present

## 2015-09-22 DIAGNOSIS — N186 End stage renal disease: Secondary | ICD-10-CM | POA: Diagnosis not present

## 2015-09-22 DIAGNOSIS — Z992 Dependence on renal dialysis: Secondary | ICD-10-CM | POA: Diagnosis not present

## 2015-09-23 DIAGNOSIS — I2510 Atherosclerotic heart disease of native coronary artery without angina pectoris: Secondary | ICD-10-CM | POA: Diagnosis not present

## 2015-09-23 DIAGNOSIS — N186 End stage renal disease: Secondary | ICD-10-CM | POA: Diagnosis not present

## 2015-09-23 DIAGNOSIS — I120 Hypertensive chronic kidney disease with stage 5 chronic kidney disease or end stage renal disease: Secondary | ICD-10-CM | POA: Diagnosis not present

## 2015-09-23 DIAGNOSIS — M797 Fibromyalgia: Secondary | ICD-10-CM | POA: Diagnosis not present

## 2015-09-23 DIAGNOSIS — M6281 Muscle weakness (generalized): Secondary | ICD-10-CM | POA: Diagnosis not present

## 2015-09-23 DIAGNOSIS — J45909 Unspecified asthma, uncomplicated: Secondary | ICD-10-CM | POA: Diagnosis not present

## 2015-09-24 DIAGNOSIS — Z992 Dependence on renal dialysis: Secondary | ICD-10-CM | POA: Diagnosis not present

## 2015-09-24 DIAGNOSIS — N186 End stage renal disease: Secondary | ICD-10-CM | POA: Diagnosis not present

## 2015-09-24 DIAGNOSIS — D509 Iron deficiency anemia, unspecified: Secondary | ICD-10-CM | POA: Diagnosis not present

## 2015-09-24 DIAGNOSIS — N2581 Secondary hyperparathyroidism of renal origin: Secondary | ICD-10-CM | POA: Diagnosis not present

## 2015-09-24 DIAGNOSIS — D631 Anemia in chronic kidney disease: Secondary | ICD-10-CM | POA: Diagnosis not present

## 2015-09-27 DIAGNOSIS — N186 End stage renal disease: Secondary | ICD-10-CM | POA: Diagnosis not present

## 2015-09-27 DIAGNOSIS — D509 Iron deficiency anemia, unspecified: Secondary | ICD-10-CM | POA: Diagnosis not present

## 2015-09-27 DIAGNOSIS — D631 Anemia in chronic kidney disease: Secondary | ICD-10-CM | POA: Diagnosis not present

## 2015-09-27 DIAGNOSIS — Z992 Dependence on renal dialysis: Secondary | ICD-10-CM | POA: Diagnosis not present

## 2015-09-27 DIAGNOSIS — N2581 Secondary hyperparathyroidism of renal origin: Secondary | ICD-10-CM | POA: Diagnosis not present

## 2015-09-28 DIAGNOSIS — M6281 Muscle weakness (generalized): Secondary | ICD-10-CM | POA: Diagnosis not present

## 2015-09-28 DIAGNOSIS — N186 End stage renal disease: Secondary | ICD-10-CM | POA: Diagnosis not present

## 2015-09-28 DIAGNOSIS — J45909 Unspecified asthma, uncomplicated: Secondary | ICD-10-CM | POA: Diagnosis not present

## 2015-09-28 DIAGNOSIS — M797 Fibromyalgia: Secondary | ICD-10-CM | POA: Diagnosis not present

## 2015-09-28 DIAGNOSIS — I2510 Atherosclerotic heart disease of native coronary artery without angina pectoris: Secondary | ICD-10-CM | POA: Diagnosis not present

## 2015-09-28 DIAGNOSIS — I120 Hypertensive chronic kidney disease with stage 5 chronic kidney disease or end stage renal disease: Secondary | ICD-10-CM | POA: Diagnosis not present

## 2015-09-29 DIAGNOSIS — D509 Iron deficiency anemia, unspecified: Secondary | ICD-10-CM | POA: Diagnosis not present

## 2015-09-29 DIAGNOSIS — N186 End stage renal disease: Secondary | ICD-10-CM | POA: Diagnosis not present

## 2015-09-29 DIAGNOSIS — N2581 Secondary hyperparathyroidism of renal origin: Secondary | ICD-10-CM | POA: Diagnosis not present

## 2015-09-29 DIAGNOSIS — Z992 Dependence on renal dialysis: Secondary | ICD-10-CM | POA: Diagnosis not present

## 2015-09-29 DIAGNOSIS — D631 Anemia in chronic kidney disease: Secondary | ICD-10-CM | POA: Diagnosis not present

## 2015-10-01 DIAGNOSIS — N2581 Secondary hyperparathyroidism of renal origin: Secondary | ICD-10-CM | POA: Diagnosis not present

## 2015-10-01 DIAGNOSIS — Z992 Dependence on renal dialysis: Secondary | ICD-10-CM | POA: Diagnosis not present

## 2015-10-01 DIAGNOSIS — N186 End stage renal disease: Secondary | ICD-10-CM | POA: Diagnosis not present

## 2015-10-01 DIAGNOSIS — D631 Anemia in chronic kidney disease: Secondary | ICD-10-CM | POA: Diagnosis not present

## 2015-10-01 DIAGNOSIS — D509 Iron deficiency anemia, unspecified: Secondary | ICD-10-CM | POA: Diagnosis not present

## 2015-10-04 DIAGNOSIS — Z992 Dependence on renal dialysis: Secondary | ICD-10-CM | POA: Diagnosis not present

## 2015-10-04 DIAGNOSIS — E785 Hyperlipidemia, unspecified: Secondary | ICD-10-CM | POA: Diagnosis not present

## 2015-10-04 DIAGNOSIS — N2581 Secondary hyperparathyroidism of renal origin: Secondary | ICD-10-CM | POA: Diagnosis not present

## 2015-10-04 DIAGNOSIS — D509 Iron deficiency anemia, unspecified: Secondary | ICD-10-CM | POA: Diagnosis not present

## 2015-10-04 DIAGNOSIS — D631 Anemia in chronic kidney disease: Secondary | ICD-10-CM | POA: Diagnosis not present

## 2015-10-04 DIAGNOSIS — N186 End stage renal disease: Secondary | ICD-10-CM | POA: Diagnosis not present

## 2015-10-05 DIAGNOSIS — M797 Fibromyalgia: Secondary | ICD-10-CM | POA: Diagnosis not present

## 2015-10-05 DIAGNOSIS — M6281 Muscle weakness (generalized): Secondary | ICD-10-CM | POA: Diagnosis not present

## 2015-10-05 DIAGNOSIS — J45909 Unspecified asthma, uncomplicated: Secondary | ICD-10-CM | POA: Diagnosis not present

## 2015-10-05 DIAGNOSIS — I120 Hypertensive chronic kidney disease with stage 5 chronic kidney disease or end stage renal disease: Secondary | ICD-10-CM | POA: Diagnosis not present

## 2015-10-05 DIAGNOSIS — N186 End stage renal disease: Secondary | ICD-10-CM | POA: Diagnosis not present

## 2015-10-05 DIAGNOSIS — I2510 Atherosclerotic heart disease of native coronary artery without angina pectoris: Secondary | ICD-10-CM | POA: Diagnosis not present

## 2015-10-06 DIAGNOSIS — N2581 Secondary hyperparathyroidism of renal origin: Secondary | ICD-10-CM | POA: Diagnosis not present

## 2015-10-06 DIAGNOSIS — N186 End stage renal disease: Secondary | ICD-10-CM | POA: Diagnosis not present

## 2015-10-06 DIAGNOSIS — Z992 Dependence on renal dialysis: Secondary | ICD-10-CM | POA: Diagnosis not present

## 2015-10-06 DIAGNOSIS — D631 Anemia in chronic kidney disease: Secondary | ICD-10-CM | POA: Diagnosis not present

## 2015-10-06 DIAGNOSIS — D509 Iron deficiency anemia, unspecified: Secondary | ICD-10-CM | POA: Diagnosis not present

## 2015-10-07 DIAGNOSIS — N186 End stage renal disease: Secondary | ICD-10-CM | POA: Diagnosis not present

## 2015-10-07 DIAGNOSIS — Z992 Dependence on renal dialysis: Secondary | ICD-10-CM | POA: Diagnosis not present

## 2015-10-08 DIAGNOSIS — Z992 Dependence on renal dialysis: Secondary | ICD-10-CM | POA: Diagnosis not present

## 2015-10-08 DIAGNOSIS — N186 End stage renal disease: Secondary | ICD-10-CM | POA: Diagnosis not present

## 2015-10-08 DIAGNOSIS — D631 Anemia in chronic kidney disease: Secondary | ICD-10-CM | POA: Diagnosis not present

## 2015-10-08 DIAGNOSIS — D509 Iron deficiency anemia, unspecified: Secondary | ICD-10-CM | POA: Diagnosis not present

## 2015-10-08 DIAGNOSIS — N2581 Secondary hyperparathyroidism of renal origin: Secondary | ICD-10-CM | POA: Diagnosis not present

## 2015-10-11 DIAGNOSIS — Z992 Dependence on renal dialysis: Secondary | ICD-10-CM | POA: Diagnosis not present

## 2015-10-11 DIAGNOSIS — D631 Anemia in chronic kidney disease: Secondary | ICD-10-CM | POA: Diagnosis not present

## 2015-10-11 DIAGNOSIS — D509 Iron deficiency anemia, unspecified: Secondary | ICD-10-CM | POA: Diagnosis not present

## 2015-10-11 DIAGNOSIS — N186 End stage renal disease: Secondary | ICD-10-CM | POA: Diagnosis not present

## 2015-10-11 DIAGNOSIS — N2581 Secondary hyperparathyroidism of renal origin: Secondary | ICD-10-CM | POA: Diagnosis not present

## 2015-10-12 DIAGNOSIS — I2510 Atherosclerotic heart disease of native coronary artery without angina pectoris: Secondary | ICD-10-CM | POA: Diagnosis not present

## 2015-10-12 DIAGNOSIS — J45909 Unspecified asthma, uncomplicated: Secondary | ICD-10-CM | POA: Diagnosis not present

## 2015-10-12 DIAGNOSIS — I120 Hypertensive chronic kidney disease with stage 5 chronic kidney disease or end stage renal disease: Secondary | ICD-10-CM | POA: Diagnosis not present

## 2015-10-12 DIAGNOSIS — M797 Fibromyalgia: Secondary | ICD-10-CM | POA: Diagnosis not present

## 2015-10-12 DIAGNOSIS — N186 End stage renal disease: Secondary | ICD-10-CM | POA: Diagnosis not present

## 2015-10-12 DIAGNOSIS — M6281 Muscle weakness (generalized): Secondary | ICD-10-CM | POA: Diagnosis not present

## 2015-10-13 DIAGNOSIS — N186 End stage renal disease: Secondary | ICD-10-CM | POA: Diagnosis not present

## 2015-10-13 DIAGNOSIS — N2581 Secondary hyperparathyroidism of renal origin: Secondary | ICD-10-CM | POA: Diagnosis not present

## 2015-10-13 DIAGNOSIS — Z992 Dependence on renal dialysis: Secondary | ICD-10-CM | POA: Diagnosis not present

## 2015-10-13 DIAGNOSIS — D509 Iron deficiency anemia, unspecified: Secondary | ICD-10-CM | POA: Diagnosis not present

## 2015-10-13 DIAGNOSIS — D631 Anemia in chronic kidney disease: Secondary | ICD-10-CM | POA: Diagnosis not present

## 2015-10-14 DIAGNOSIS — I120 Hypertensive chronic kidney disease with stage 5 chronic kidney disease or end stage renal disease: Secondary | ICD-10-CM | POA: Diagnosis not present

## 2015-10-14 DIAGNOSIS — J45909 Unspecified asthma, uncomplicated: Secondary | ICD-10-CM | POA: Diagnosis not present

## 2015-10-14 DIAGNOSIS — M6281 Muscle weakness (generalized): Secondary | ICD-10-CM | POA: Diagnosis not present

## 2015-10-14 DIAGNOSIS — N186 End stage renal disease: Secondary | ICD-10-CM | POA: Diagnosis not present

## 2015-10-14 DIAGNOSIS — M797 Fibromyalgia: Secondary | ICD-10-CM | POA: Diagnosis not present

## 2015-10-14 DIAGNOSIS — I2510 Atherosclerotic heart disease of native coronary artery without angina pectoris: Secondary | ICD-10-CM | POA: Diagnosis not present

## 2015-10-15 DIAGNOSIS — D509 Iron deficiency anemia, unspecified: Secondary | ICD-10-CM | POA: Diagnosis not present

## 2015-10-15 DIAGNOSIS — D631 Anemia in chronic kidney disease: Secondary | ICD-10-CM | POA: Diagnosis not present

## 2015-10-15 DIAGNOSIS — N186 End stage renal disease: Secondary | ICD-10-CM | POA: Diagnosis not present

## 2015-10-15 DIAGNOSIS — Z992 Dependence on renal dialysis: Secondary | ICD-10-CM | POA: Diagnosis not present

## 2015-10-15 DIAGNOSIS — N2581 Secondary hyperparathyroidism of renal origin: Secondary | ICD-10-CM | POA: Diagnosis not present

## 2015-10-18 DIAGNOSIS — N186 End stage renal disease: Secondary | ICD-10-CM | POA: Diagnosis not present

## 2015-10-18 DIAGNOSIS — N2581 Secondary hyperparathyroidism of renal origin: Secondary | ICD-10-CM | POA: Diagnosis not present

## 2015-10-18 DIAGNOSIS — D631 Anemia in chronic kidney disease: Secondary | ICD-10-CM | POA: Diagnosis not present

## 2015-10-18 DIAGNOSIS — Z992 Dependence on renal dialysis: Secondary | ICD-10-CM | POA: Diagnosis not present

## 2015-10-18 DIAGNOSIS — D509 Iron deficiency anemia, unspecified: Secondary | ICD-10-CM | POA: Diagnosis not present

## 2015-10-19 DIAGNOSIS — I120 Hypertensive chronic kidney disease with stage 5 chronic kidney disease or end stage renal disease: Secondary | ICD-10-CM | POA: Diagnosis not present

## 2015-10-19 DIAGNOSIS — I2510 Atherosclerotic heart disease of native coronary artery without angina pectoris: Secondary | ICD-10-CM | POA: Diagnosis not present

## 2015-10-19 DIAGNOSIS — J45909 Unspecified asthma, uncomplicated: Secondary | ICD-10-CM | POA: Diagnosis not present

## 2015-10-19 DIAGNOSIS — M797 Fibromyalgia: Secondary | ICD-10-CM | POA: Diagnosis not present

## 2015-10-19 DIAGNOSIS — M6281 Muscle weakness (generalized): Secondary | ICD-10-CM | POA: Diagnosis not present

## 2015-10-19 DIAGNOSIS — N186 End stage renal disease: Secondary | ICD-10-CM | POA: Diagnosis not present

## 2015-10-20 DIAGNOSIS — N186 End stage renal disease: Secondary | ICD-10-CM | POA: Diagnosis not present

## 2015-10-20 DIAGNOSIS — D631 Anemia in chronic kidney disease: Secondary | ICD-10-CM | POA: Diagnosis not present

## 2015-10-20 DIAGNOSIS — D509 Iron deficiency anemia, unspecified: Secondary | ICD-10-CM | POA: Diagnosis not present

## 2015-10-20 DIAGNOSIS — N2581 Secondary hyperparathyroidism of renal origin: Secondary | ICD-10-CM | POA: Diagnosis not present

## 2015-10-20 DIAGNOSIS — Z992 Dependence on renal dialysis: Secondary | ICD-10-CM | POA: Diagnosis not present

## 2015-10-22 DIAGNOSIS — Z992 Dependence on renal dialysis: Secondary | ICD-10-CM | POA: Diagnosis not present

## 2015-10-22 DIAGNOSIS — N2581 Secondary hyperparathyroidism of renal origin: Secondary | ICD-10-CM | POA: Diagnosis not present

## 2015-10-22 DIAGNOSIS — D509 Iron deficiency anemia, unspecified: Secondary | ICD-10-CM | POA: Diagnosis not present

## 2015-10-22 DIAGNOSIS — D631 Anemia in chronic kidney disease: Secondary | ICD-10-CM | POA: Diagnosis not present

## 2015-10-22 DIAGNOSIS — N186 End stage renal disease: Secondary | ICD-10-CM | POA: Diagnosis not present

## 2015-10-25 DIAGNOSIS — D631 Anemia in chronic kidney disease: Secondary | ICD-10-CM | POA: Diagnosis not present

## 2015-10-25 DIAGNOSIS — D509 Iron deficiency anemia, unspecified: Secondary | ICD-10-CM | POA: Diagnosis not present

## 2015-10-25 DIAGNOSIS — Z992 Dependence on renal dialysis: Secondary | ICD-10-CM | POA: Diagnosis not present

## 2015-10-25 DIAGNOSIS — N186 End stage renal disease: Secondary | ICD-10-CM | POA: Diagnosis not present

## 2015-10-25 DIAGNOSIS — N2581 Secondary hyperparathyroidism of renal origin: Secondary | ICD-10-CM | POA: Diagnosis not present

## 2015-10-27 DIAGNOSIS — N2581 Secondary hyperparathyroidism of renal origin: Secondary | ICD-10-CM | POA: Diagnosis not present

## 2015-10-27 DIAGNOSIS — Z992 Dependence on renal dialysis: Secondary | ICD-10-CM | POA: Diagnosis not present

## 2015-10-27 DIAGNOSIS — N186 End stage renal disease: Secondary | ICD-10-CM | POA: Diagnosis not present

## 2015-10-27 DIAGNOSIS — D509 Iron deficiency anemia, unspecified: Secondary | ICD-10-CM | POA: Diagnosis not present

## 2015-10-27 DIAGNOSIS — D631 Anemia in chronic kidney disease: Secondary | ICD-10-CM | POA: Diagnosis not present

## 2015-10-29 DIAGNOSIS — N186 End stage renal disease: Secondary | ICD-10-CM | POA: Diagnosis not present

## 2015-10-29 DIAGNOSIS — D631 Anemia in chronic kidney disease: Secondary | ICD-10-CM | POA: Diagnosis not present

## 2015-10-29 DIAGNOSIS — Z992 Dependence on renal dialysis: Secondary | ICD-10-CM | POA: Diagnosis not present

## 2015-10-29 DIAGNOSIS — D509 Iron deficiency anemia, unspecified: Secondary | ICD-10-CM | POA: Diagnosis not present

## 2015-10-29 DIAGNOSIS — N2581 Secondary hyperparathyroidism of renal origin: Secondary | ICD-10-CM | POA: Diagnosis not present

## 2015-11-01 DIAGNOSIS — Z992 Dependence on renal dialysis: Secondary | ICD-10-CM | POA: Diagnosis not present

## 2015-11-01 DIAGNOSIS — N186 End stage renal disease: Secondary | ICD-10-CM | POA: Diagnosis not present

## 2015-11-01 DIAGNOSIS — N2581 Secondary hyperparathyroidism of renal origin: Secondary | ICD-10-CM | POA: Diagnosis not present

## 2015-11-01 DIAGNOSIS — D509 Iron deficiency anemia, unspecified: Secondary | ICD-10-CM | POA: Diagnosis not present

## 2015-11-01 DIAGNOSIS — D631 Anemia in chronic kidney disease: Secondary | ICD-10-CM | POA: Diagnosis not present

## 2015-11-03 DIAGNOSIS — D509 Iron deficiency anemia, unspecified: Secondary | ICD-10-CM | POA: Diagnosis not present

## 2015-11-03 DIAGNOSIS — Z992 Dependence on renal dialysis: Secondary | ICD-10-CM | POA: Diagnosis not present

## 2015-11-03 DIAGNOSIS — N2581 Secondary hyperparathyroidism of renal origin: Secondary | ICD-10-CM | POA: Diagnosis not present

## 2015-11-03 DIAGNOSIS — D631 Anemia in chronic kidney disease: Secondary | ICD-10-CM | POA: Diagnosis not present

## 2015-11-03 DIAGNOSIS — N186 End stage renal disease: Secondary | ICD-10-CM | POA: Diagnosis not present

## 2015-11-05 DIAGNOSIS — Z992 Dependence on renal dialysis: Secondary | ICD-10-CM | POA: Diagnosis not present

## 2015-11-05 DIAGNOSIS — N2581 Secondary hyperparathyroidism of renal origin: Secondary | ICD-10-CM | POA: Diagnosis not present

## 2015-11-05 DIAGNOSIS — N186 End stage renal disease: Secondary | ICD-10-CM | POA: Diagnosis not present

## 2015-11-05 DIAGNOSIS — D631 Anemia in chronic kidney disease: Secondary | ICD-10-CM | POA: Diagnosis not present

## 2015-11-05 DIAGNOSIS — D509 Iron deficiency anemia, unspecified: Secondary | ICD-10-CM | POA: Diagnosis not present

## 2015-11-06 DIAGNOSIS — N186 End stage renal disease: Secondary | ICD-10-CM | POA: Diagnosis not present

## 2015-11-06 DIAGNOSIS — Z992 Dependence on renal dialysis: Secondary | ICD-10-CM | POA: Diagnosis not present

## 2015-11-08 DIAGNOSIS — D631 Anemia in chronic kidney disease: Secondary | ICD-10-CM | POA: Diagnosis not present

## 2015-11-08 DIAGNOSIS — N2581 Secondary hyperparathyroidism of renal origin: Secondary | ICD-10-CM | POA: Diagnosis not present

## 2015-11-08 DIAGNOSIS — D509 Iron deficiency anemia, unspecified: Secondary | ICD-10-CM | POA: Diagnosis not present

## 2015-11-08 DIAGNOSIS — Z992 Dependence on renal dialysis: Secondary | ICD-10-CM | POA: Diagnosis not present

## 2015-11-08 DIAGNOSIS — N186 End stage renal disease: Secondary | ICD-10-CM | POA: Diagnosis not present

## 2015-11-10 DIAGNOSIS — Z992 Dependence on renal dialysis: Secondary | ICD-10-CM | POA: Diagnosis not present

## 2015-11-10 DIAGNOSIS — D509 Iron deficiency anemia, unspecified: Secondary | ICD-10-CM | POA: Diagnosis not present

## 2015-11-10 DIAGNOSIS — N2581 Secondary hyperparathyroidism of renal origin: Secondary | ICD-10-CM | POA: Diagnosis not present

## 2015-11-10 DIAGNOSIS — D631 Anemia in chronic kidney disease: Secondary | ICD-10-CM | POA: Diagnosis not present

## 2015-11-10 DIAGNOSIS — N186 End stage renal disease: Secondary | ICD-10-CM | POA: Diagnosis not present

## 2015-11-12 DIAGNOSIS — D631 Anemia in chronic kidney disease: Secondary | ICD-10-CM | POA: Diagnosis not present

## 2015-11-12 DIAGNOSIS — D509 Iron deficiency anemia, unspecified: Secondary | ICD-10-CM | POA: Diagnosis not present

## 2015-11-12 DIAGNOSIS — N2581 Secondary hyperparathyroidism of renal origin: Secondary | ICD-10-CM | POA: Diagnosis not present

## 2015-11-12 DIAGNOSIS — Z992 Dependence on renal dialysis: Secondary | ICD-10-CM | POA: Diagnosis not present

## 2015-11-12 DIAGNOSIS — N186 End stage renal disease: Secondary | ICD-10-CM | POA: Diagnosis not present

## 2015-11-15 DIAGNOSIS — N186 End stage renal disease: Secondary | ICD-10-CM | POA: Diagnosis not present

## 2015-11-15 DIAGNOSIS — Z992 Dependence on renal dialysis: Secondary | ICD-10-CM | POA: Diagnosis not present

## 2015-11-15 DIAGNOSIS — D509 Iron deficiency anemia, unspecified: Secondary | ICD-10-CM | POA: Diagnosis not present

## 2015-11-15 DIAGNOSIS — N2581 Secondary hyperparathyroidism of renal origin: Secondary | ICD-10-CM | POA: Diagnosis not present

## 2015-11-15 DIAGNOSIS — D631 Anemia in chronic kidney disease: Secondary | ICD-10-CM | POA: Diagnosis not present

## 2015-11-17 DIAGNOSIS — Z992 Dependence on renal dialysis: Secondary | ICD-10-CM | POA: Diagnosis not present

## 2015-11-17 DIAGNOSIS — N2581 Secondary hyperparathyroidism of renal origin: Secondary | ICD-10-CM | POA: Diagnosis not present

## 2015-11-17 DIAGNOSIS — N186 End stage renal disease: Secondary | ICD-10-CM | POA: Diagnosis not present

## 2015-11-17 DIAGNOSIS — D631 Anemia in chronic kidney disease: Secondary | ICD-10-CM | POA: Diagnosis not present

## 2015-11-17 DIAGNOSIS — D509 Iron deficiency anemia, unspecified: Secondary | ICD-10-CM | POA: Diagnosis not present

## 2015-11-19 DIAGNOSIS — N186 End stage renal disease: Secondary | ICD-10-CM | POA: Diagnosis not present

## 2015-11-19 DIAGNOSIS — Z992 Dependence on renal dialysis: Secondary | ICD-10-CM | POA: Diagnosis not present

## 2015-11-19 DIAGNOSIS — D631 Anemia in chronic kidney disease: Secondary | ICD-10-CM | POA: Diagnosis not present

## 2015-11-19 DIAGNOSIS — D509 Iron deficiency anemia, unspecified: Secondary | ICD-10-CM | POA: Diagnosis not present

## 2015-11-19 DIAGNOSIS — N2581 Secondary hyperparathyroidism of renal origin: Secondary | ICD-10-CM | POA: Diagnosis not present

## 2015-11-21 ENCOUNTER — Other Ambulatory Visit: Payer: Self-pay | Admitting: Cardiology

## 2015-11-21 MED ORDER — NITROGLYCERIN 0.4 MG SL SUBL: SUBLINGUAL_TABLET | SUBLINGUAL | Status: DC

## 2015-11-21 NOTE — Telephone Encounter (Signed)
Nitroglycerin was filled 08/29/15 #75 with 1 refill. Will give 1 refill of #25 until pt comes in for June appt with Dr. Harl Bowie

## 2015-11-21 NOTE — Telephone Encounter (Signed)
Sherry Cox needs refill on Nitroglycerin 0.4 mg

## 2015-11-22 DIAGNOSIS — Z992 Dependence on renal dialysis: Secondary | ICD-10-CM | POA: Diagnosis not present

## 2015-11-22 DIAGNOSIS — D509 Iron deficiency anemia, unspecified: Secondary | ICD-10-CM | POA: Diagnosis not present

## 2015-11-22 DIAGNOSIS — N186 End stage renal disease: Secondary | ICD-10-CM | POA: Diagnosis not present

## 2015-11-22 DIAGNOSIS — D631 Anemia in chronic kidney disease: Secondary | ICD-10-CM | POA: Diagnosis not present

## 2015-11-22 DIAGNOSIS — N2581 Secondary hyperparathyroidism of renal origin: Secondary | ICD-10-CM | POA: Diagnosis not present

## 2015-11-24 DIAGNOSIS — N2581 Secondary hyperparathyroidism of renal origin: Secondary | ICD-10-CM | POA: Diagnosis not present

## 2015-11-24 DIAGNOSIS — D631 Anemia in chronic kidney disease: Secondary | ICD-10-CM | POA: Diagnosis not present

## 2015-11-24 DIAGNOSIS — Z992 Dependence on renal dialysis: Secondary | ICD-10-CM | POA: Diagnosis not present

## 2015-11-24 DIAGNOSIS — D509 Iron deficiency anemia, unspecified: Secondary | ICD-10-CM | POA: Diagnosis not present

## 2015-11-24 DIAGNOSIS — N186 End stage renal disease: Secondary | ICD-10-CM | POA: Diagnosis not present

## 2015-11-26 DIAGNOSIS — Z992 Dependence on renal dialysis: Secondary | ICD-10-CM | POA: Diagnosis not present

## 2015-11-26 DIAGNOSIS — N2581 Secondary hyperparathyroidism of renal origin: Secondary | ICD-10-CM | POA: Diagnosis not present

## 2015-11-26 DIAGNOSIS — N186 End stage renal disease: Secondary | ICD-10-CM | POA: Diagnosis not present

## 2015-11-26 DIAGNOSIS — D631 Anemia in chronic kidney disease: Secondary | ICD-10-CM | POA: Diagnosis not present

## 2015-11-26 DIAGNOSIS — D509 Iron deficiency anemia, unspecified: Secondary | ICD-10-CM | POA: Diagnosis not present

## 2015-11-28 ENCOUNTER — Other Ambulatory Visit: Payer: Self-pay | Admitting: Cardiology

## 2015-11-29 DIAGNOSIS — D509 Iron deficiency anemia, unspecified: Secondary | ICD-10-CM | POA: Diagnosis not present

## 2015-11-29 DIAGNOSIS — N186 End stage renal disease: Secondary | ICD-10-CM | POA: Diagnosis not present

## 2015-11-29 DIAGNOSIS — Z992 Dependence on renal dialysis: Secondary | ICD-10-CM | POA: Diagnosis not present

## 2015-11-29 DIAGNOSIS — D631 Anemia in chronic kidney disease: Secondary | ICD-10-CM | POA: Diagnosis not present

## 2015-11-29 DIAGNOSIS — N2581 Secondary hyperparathyroidism of renal origin: Secondary | ICD-10-CM | POA: Diagnosis not present

## 2015-12-01 DIAGNOSIS — D631 Anemia in chronic kidney disease: Secondary | ICD-10-CM | POA: Diagnosis not present

## 2015-12-01 DIAGNOSIS — Z992 Dependence on renal dialysis: Secondary | ICD-10-CM | POA: Diagnosis not present

## 2015-12-01 DIAGNOSIS — N186 End stage renal disease: Secondary | ICD-10-CM | POA: Diagnosis not present

## 2015-12-01 DIAGNOSIS — N2581 Secondary hyperparathyroidism of renal origin: Secondary | ICD-10-CM | POA: Diagnosis not present

## 2015-12-01 DIAGNOSIS — D509 Iron deficiency anemia, unspecified: Secondary | ICD-10-CM | POA: Diagnosis not present

## 2015-12-03 DIAGNOSIS — D631 Anemia in chronic kidney disease: Secondary | ICD-10-CM | POA: Diagnosis not present

## 2015-12-03 DIAGNOSIS — Z992 Dependence on renal dialysis: Secondary | ICD-10-CM | POA: Diagnosis not present

## 2015-12-03 DIAGNOSIS — N2581 Secondary hyperparathyroidism of renal origin: Secondary | ICD-10-CM | POA: Diagnosis not present

## 2015-12-03 DIAGNOSIS — N186 End stage renal disease: Secondary | ICD-10-CM | POA: Diagnosis not present

## 2015-12-03 DIAGNOSIS — D509 Iron deficiency anemia, unspecified: Secondary | ICD-10-CM | POA: Diagnosis not present

## 2015-12-06 DIAGNOSIS — D509 Iron deficiency anemia, unspecified: Secondary | ICD-10-CM | POA: Diagnosis not present

## 2015-12-06 DIAGNOSIS — N2581 Secondary hyperparathyroidism of renal origin: Secondary | ICD-10-CM | POA: Diagnosis not present

## 2015-12-06 DIAGNOSIS — D631 Anemia in chronic kidney disease: Secondary | ICD-10-CM | POA: Diagnosis not present

## 2015-12-06 DIAGNOSIS — Z992 Dependence on renal dialysis: Secondary | ICD-10-CM | POA: Diagnosis not present

## 2015-12-06 DIAGNOSIS — N186 End stage renal disease: Secondary | ICD-10-CM | POA: Diagnosis not present

## 2015-12-07 ENCOUNTER — Encounter (HOSPITAL_COMMUNITY): Payer: Self-pay | Admitting: Psychiatry

## 2015-12-07 ENCOUNTER — Ambulatory Visit (INDEPENDENT_AMBULATORY_CARE_PROVIDER_SITE_OTHER): Payer: Medicare Other | Admitting: Psychiatry

## 2015-12-07 VITALS — BP 190/89 | HR 75 | Ht <= 58 in | Wt 212.6 lb

## 2015-12-07 DIAGNOSIS — F331 Major depressive disorder, recurrent, moderate: Secondary | ICD-10-CM

## 2015-12-07 DIAGNOSIS — F411 Generalized anxiety disorder: Secondary | ICD-10-CM

## 2015-12-07 DIAGNOSIS — Z992 Dependence on renal dialysis: Secondary | ICD-10-CM | POA: Diagnosis not present

## 2015-12-07 DIAGNOSIS — N186 End stage renal disease: Secondary | ICD-10-CM | POA: Diagnosis not present

## 2015-12-07 MED ORDER — SERTRALINE HCL 100 MG PO TABS: 100.0000 mg | ORAL_TABLET | Freq: Every day | ORAL | Status: DC

## 2015-12-07 MED ORDER — CLONAZEPAM 0.5 MG PO TABS: 0.5000 mg | ORAL_TABLET | Freq: Four times a day (QID) | ORAL | Status: DC
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
## 2015-12-08 ENCOUNTER — Other Ambulatory Visit: Payer: Self-pay | Admitting: *Deleted

## 2015-12-08 DIAGNOSIS — N186 End stage renal disease: Secondary | ICD-10-CM | POA: Diagnosis not present

## 2015-12-08 DIAGNOSIS — D631 Anemia in chronic kidney disease: Secondary | ICD-10-CM | POA: Diagnosis not present

## 2015-12-08 DIAGNOSIS — N2581 Secondary hyperparathyroidism of renal origin: Secondary | ICD-10-CM | POA: Diagnosis not present

## 2015-12-08 DIAGNOSIS — D509 Iron deficiency anemia, unspecified: Secondary | ICD-10-CM | POA: Diagnosis not present

## 2015-12-08 DIAGNOSIS — Z992 Dependence on renal dialysis: Secondary | ICD-10-CM | POA: Diagnosis not present

## 2015-12-08 MED ORDER — NITROGLYCERIN 0.4 MG SL SUBL: SUBLINGUAL_TABLET | SUBLINGUAL | Status: DC

## 2015-12-10 DIAGNOSIS — N2581 Secondary hyperparathyroidism of renal origin: Secondary | ICD-10-CM | POA: Diagnosis not present

## 2015-12-10 DIAGNOSIS — D631 Anemia in chronic kidney disease: Secondary | ICD-10-CM | POA: Diagnosis not present

## 2015-12-10 DIAGNOSIS — N186 End stage renal disease: Secondary | ICD-10-CM | POA: Diagnosis not present

## 2015-12-10 DIAGNOSIS — Z992 Dependence on renal dialysis: Secondary | ICD-10-CM | POA: Diagnosis not present

## 2015-12-10 DIAGNOSIS — D509 Iron deficiency anemia, unspecified: Secondary | ICD-10-CM | POA: Diagnosis not present

## 2015-12-13 DIAGNOSIS — D509 Iron deficiency anemia, unspecified: Secondary | ICD-10-CM | POA: Diagnosis not present

## 2015-12-13 DIAGNOSIS — D631 Anemia in chronic kidney disease: Secondary | ICD-10-CM | POA: Diagnosis not present

## 2015-12-13 DIAGNOSIS — N2581 Secondary hyperparathyroidism of renal origin: Secondary | ICD-10-CM | POA: Diagnosis not present

## 2015-12-13 DIAGNOSIS — Z992 Dependence on renal dialysis: Secondary | ICD-10-CM | POA: Diagnosis not present

## 2015-12-13 DIAGNOSIS — N186 End stage renal disease: Secondary | ICD-10-CM | POA: Diagnosis not present

## 2015-12-15 DIAGNOSIS — N2581 Secondary hyperparathyroidism of renal origin: Secondary | ICD-10-CM | POA: Diagnosis not present

## 2015-12-15 DIAGNOSIS — N186 End stage renal disease: Secondary | ICD-10-CM | POA: Diagnosis not present

## 2015-12-15 DIAGNOSIS — D509 Iron deficiency anemia, unspecified: Secondary | ICD-10-CM | POA: Diagnosis not present

## 2015-12-15 DIAGNOSIS — Z992 Dependence on renal dialysis: Secondary | ICD-10-CM | POA: Diagnosis not present

## 2015-12-15 DIAGNOSIS — D631 Anemia in chronic kidney disease: Secondary | ICD-10-CM | POA: Diagnosis not present

## 2015-12-17 DIAGNOSIS — N2581 Secondary hyperparathyroidism of renal origin: Secondary | ICD-10-CM | POA: Diagnosis not present

## 2015-12-17 DIAGNOSIS — N186 End stage renal disease: Secondary | ICD-10-CM | POA: Diagnosis not present

## 2015-12-17 DIAGNOSIS — Z992 Dependence on renal dialysis: Secondary | ICD-10-CM | POA: Diagnosis not present

## 2015-12-17 DIAGNOSIS — D631 Anemia in chronic kidney disease: Secondary | ICD-10-CM | POA: Diagnosis not present

## 2015-12-17 DIAGNOSIS — D509 Iron deficiency anemia, unspecified: Secondary | ICD-10-CM | POA: Diagnosis not present

## 2015-12-20 DIAGNOSIS — N2581 Secondary hyperparathyroidism of renal origin: Secondary | ICD-10-CM | POA: Diagnosis not present

## 2015-12-20 DIAGNOSIS — D509 Iron deficiency anemia, unspecified: Secondary | ICD-10-CM | POA: Diagnosis not present

## 2015-12-20 DIAGNOSIS — N186 End stage renal disease: Secondary | ICD-10-CM | POA: Diagnosis not present

## 2015-12-20 DIAGNOSIS — D631 Anemia in chronic kidney disease: Secondary | ICD-10-CM | POA: Diagnosis not present

## 2015-12-20 DIAGNOSIS — Z992 Dependence on renal dialysis: Secondary | ICD-10-CM | POA: Diagnosis not present

## 2015-12-23 ENCOUNTER — Encounter: Payer: Self-pay | Admitting: *Deleted

## 2015-12-23 ENCOUNTER — Ambulatory Visit (INDEPENDENT_AMBULATORY_CARE_PROVIDER_SITE_OTHER): Payer: Medicare Other | Admitting: Cardiology

## 2015-12-23 ENCOUNTER — Encounter: Payer: Self-pay | Admitting: Cardiology

## 2015-12-23 VITALS — BP 173/63 | HR 72 | Ht <= 58 in | Wt 211.0 lb

## 2015-12-23 DIAGNOSIS — N186 End stage renal disease: Secondary | ICD-10-CM | POA: Diagnosis not present

## 2015-12-23 DIAGNOSIS — D631 Anemia in chronic kidney disease: Secondary | ICD-10-CM | POA: Diagnosis not present

## 2015-12-23 DIAGNOSIS — I10 Essential (primary) hypertension: Secondary | ICD-10-CM | POA: Diagnosis not present

## 2015-12-23 DIAGNOSIS — E785 Hyperlipidemia, unspecified: Secondary | ICD-10-CM

## 2015-12-23 DIAGNOSIS — Z992 Dependence on renal dialysis: Secondary | ICD-10-CM | POA: Diagnosis not present

## 2015-12-23 DIAGNOSIS — N2581 Secondary hyperparathyroidism of renal origin: Secondary | ICD-10-CM | POA: Diagnosis not present

## 2015-12-23 DIAGNOSIS — I2510 Atherosclerotic heart disease of native coronary artery without angina pectoris: Secondary | ICD-10-CM

## 2015-12-23 DIAGNOSIS — D509 Iron deficiency anemia, unspecified: Secondary | ICD-10-CM | POA: Diagnosis not present

## 2015-12-23 DIAGNOSIS — I6523 Occlusion and stenosis of bilateral carotid arteries: Secondary | ICD-10-CM | POA: Diagnosis not present

## 2015-12-23 MED ORDER — NITROGLYCERIN 0.4 MG SL SUBL: SUBLINGUAL_TABLET | SUBLINGUAL | Status: DC

## 2015-12-23 MED ORDER — ATORVASTATIN CALCIUM 80 MG PO TABS: 80.0000 mg | ORAL_TABLET | Freq: Every day | ORAL | Status: DC

## 2015-12-23 NOTE — Patient Instructions (Signed)
Medication Instructions:   Stop Simvastatin.  Begin Atorvastatin 80mg  daily.  Nitroglycerin refilled today.  Medications above sent to [facility] today.   Labwork:   Testing/Procedures:   Follow-Up:  1 year   Any Other Special Instructions Will Be Listed Below (If Applicable).  If you need a refill on your cardiac medications before your next appointment, please call your pharmacy.

## 2015-12-23 NOTE — Progress Notes (Signed)
Clinical Summary Ms. Novitsky is a 55 y.o.female former patient of Dr Ron Parker, this is our first visit together. She is seen for the following medical problems.  1. CAD - prior CABG in 2007 - denies any recent chest pain.  - compliant with meds  2. Carotid stenosis - followed by vascular  3. ESRD - compliant with HD, however missed yesterdays session due to recent health issues with her brother  4. Heart murmur - normal echo 2012, thought to be a flow murmur  5. Hyperlipidemia - compliant with statin  Past Medical History  Diagnosis Date  . Overweight(278.02)   . Anxiety disorder   . Carotid artery stenosis     12/10/2008 doppler 0000000 RICA, 99991111 LICA/see evaluation by Dr. Oneida Alar 2011  . CAD (coronary artery disease)     Total RCA, stress echo [facility] 11/2008-no ischemia, EF 55%; h/o CABG  . Hyperlipidemia   . ESRD on dialysis [facility])     Transplant consideration  . Tobacco abuse   . Hx of CABG     Off pump LIMA to LAD  05/2006  . Ejection fraction     EF 60-65%, echo, June, 2009  . Murmur     October, 2012  . Neuropathy ([place])   . Anxiety   . Hx of tobacco use, presenting hazards to health 04/26/2012  . Irregular heart beat   . Ankle injury   . Hemochromatosis      No Known Allergies   Current Outpatient Prescriptions  Medication Sig Dispense Refill  . albuterol (PROAIR HFA) 108 (90 BASE) MCG/ACT inhaler Inhale 2 puffs into the lungs every 4 (four) hours as needed. For wheezing    . aspirin EC 81 MG tablet Take 1 tablet (81 mg total) by mouth daily.    . clonazePAM (KLONOPIN) 0.5 MG tablet Take 1 tablet (0.5 mg total) by mouth 4 (four) times daily. 120 tablet 3  . diclofenac (VOLTAREN) 75 MG EC tablet Take 75 mg by mouth 2 (two) times daily.    . folic acid (FOLVITE) 1 MG tablet Take 1 mg by mouth daily.    . folic acid-vitamin b complex-vitamin c-selenium-zinc (DIALYVITE) 3 MG TABS Take 1 tablet by mouth daily.      . furosemide (LASIX) 80 MG tablet Take 80  mg by mouth.    Marland Kitchen HYDROcodone-acetaminophen (VICODIN) 5-500 MG per tablet Take by mouth 3 (three) times daily.     Marland Kitchen levothyroxine (SYNTHROID, LEVOTHROID) 150 MCG tablet Take 1 tablet by mouth Daily.    Marland Kitchen LOPRESSOR 50 MG tablet TAKE 1 BY MOUTH TWO TIMES ADAY ON MONDAY, WEDNESDAY   AND FRIDAY AND  TAKE 1     ON ALL OTHER DAYS 40 tablet 9  . nitroGLYCERIN (NITROSTAT) 0.4 MG SL tablet PLACE 1 UNDER TONGUE EVERY 5 MINUTES FOR SEVERE CHEST PAIN. IF NOT RELIEVED IN AFTER 3RD DOSE PROCEED TO THE ED FOR AN EVALUATION 75 tablet 0  . sertraline (ZOLOFT) 100 MG tablet Take 1 tablet (100 mg total) by mouth daily. 90 tablet 3  . sevelamer (RENVELA) 800 MG tablet Take 4,000 mg by mouth 3 (three) times daily with meals. Take 5 tablets (4000mg ) by mouth with meals and 2 tablets by mouth with snacks.    . simvastatin (ZOCOR) 40 MG tablet TAKE 1 BY MOUTH DAILY AT   6 PM 30 tablet 11  . sodium bicarbonate 650 MG tablet Take 650 mg by mouth 2 (two) times daily.     Marland Kitchen  topiramate (TOPAMAX) 25 MG tablet as needed.    . zolpidem (AMBIEN) 5 MG tablet Take 5 mg by mouth at bedtime as needed. For sleep     No current facility-administered medications for this visit.     Past Surgical History  Procedure Laterality Date  . Coronary artery bypass graft  05/2006    Off pump LIMA to LAD  . Inner ear surgery    . Plastic surgery on leg    . Cardiac valve surgery  2007  . Cholecystectomy  1995    Gall Bladder  . Carotid angiogram Bilateral 03/06/2013    Procedure: CAROTID ANGIOGRAM;  Surgeon: Elam Dutch, MD;  Location: [facility] CATH LAB;  Service: Cardiovascular;  Laterality: Bilateral;  . Av fistula placement      Has HD on Tues,Thurs, and Saturday at [facility]     No Known Allergies    Family History  Problem Relation Age of Onset  . Depression Sister   . Dementia Sister   . OCD Sister   . Diabetes Sister   . Hypertension Sister   . Heart attack Sister   . Bipolar disorder Brother   . Drug abuse  Brother   . Diabetes Brother   . Hypertension Brother   . Heart disease Brother   . Bipolar disorder Other   . ADD / ADHD Other   . Seizures Other   . Bipolar disorder Other   . Alcohol abuse Other   . Dementia Mother   . Heart disease Mother   . Hypertension Mother   . Heart attack Mother   . Alcohol abuse Father   . Heart disease Father     Heart Disease before age 88  . Hypertension Father   . Heart attack Father   . Dementia Maternal Aunt   . Paranoid behavior Neg Hx   . Schizophrenia Neg Hx   . Sexual abuse Neg Hx   . Physical abuse Neg Hx   . OCD Sister   . Heart disease Sister     Heart Disease before age 33     Social History Ms. Macgowan reports that she has been smoking Cigarettes.  She has a 9 pack-year smoking history. She has never used smokeless tobacco. Ms. Kratzer reports that she does not drink alcohol.   Review of Systems CONSTITUTIONAL: No weight loss, fever, chills, weakness or fatigue.  HEENT: Eyes: No visual loss, blurred vision, double vision or yellow sclerae.No hearing loss, sneezing, congestion, runny nose or sore throat.  SKIN: No rash or itching.  CARDIOVASCULAR: per HPI RESPIRATORY: No shortness of breath, cough or sputum.  GASTROINTESTINAL: No anorexia, nausea, vomiting or diarrhea. No abdominal pain or blood.  GENITOURINARY: No burning on urination, no polyuria NEUROLOGICAL: No headache, dizziness, syncope, paralysis, ataxia, numbness or tingling in the extremities. No change in bowel or bladder control.  MUSCULOSKELETAL: No muscle, back pain, joint pain or stiffness.  LYMPHATICS: No enlarged nodes. No history of splenectomy.  PSYCHIATRIC: No history of depression or anxiety.  ENDOCRINOLOGIC: No reports of sweating, cold or heat intolerance. No polyuria or polydipsia.  Marland Kitchen   Physical Examination Filed Vitals:   12/23/15 0919  BP: 173/63  Pulse: 72   Filed Vitals:   12/23/15 0919  Height: 4\' 9"  (1.448 m)  Weight: 211 lb (95.709  kg)    Gen: resting comfortably, no acute distress HEENT: no scleral icterus, pupils equal round and reactive, no palptable cervical adenopathy,  CV: RRR, 2/6 systolic murmur RUSB,  no jvd. +bilateral carotid bruits Resp: Clear to auscultation bilaterally GI: abdomen is soft, non-tender, non-distended, normal bowel sounds, no hepatosplenomegaly MSK: extremities are warm, no edema.  Skin: warm, no rash Neuro:  no focal deficits Psych: appropriate affect   Diagnostic Studies  04/2011 echo Study Conclusions  - Left ventricle: The cavity size was normal. Systolic function was normal. The estimated ejection fraction was in the range of 60% to 65%. Wall motion was normal; there were no regional wall motion abnormalities. Doppler parameters are consistent with abnormal left ventricular relaxation (grade 1 diastolic dysfunction). - Aortic valve: Valve area: 1.38cm^2(VTI). Valve area: 1.13cm^2 (Vmax).   01/2015 Carotid US RICA 40-59%, left bifurcatoin 40-59%    Assessment and Plan  1. CAD - no recent symptoms - EKG in clinic SR, no ischemic changes.  - continue current meds  2. Carotid stenosis - continue to follow with vascular  3. HTN - elevated in clinic. She has not taken her meds yet today and also missed HD yesterday - continue to monitor  4. Hyperlipidemia - request most recent labs from pcp -  In setting of known CAD change to high dose statin, atorvastatin 80mg  daily    F/u 1 year    Arnoldo Lenis, M.D.

## 2015-12-24 DIAGNOSIS — N186 End stage renal disease: Secondary | ICD-10-CM | POA: Diagnosis not present

## 2015-12-24 DIAGNOSIS — N2581 Secondary hyperparathyroidism of renal origin: Secondary | ICD-10-CM | POA: Diagnosis not present

## 2015-12-24 DIAGNOSIS — Z992 Dependence on renal dialysis: Secondary | ICD-10-CM | POA: Diagnosis not present

## 2015-12-24 DIAGNOSIS — D631 Anemia in chronic kidney disease: Secondary | ICD-10-CM | POA: Diagnosis not present

## 2015-12-24 DIAGNOSIS — D509 Iron deficiency anemia, unspecified: Secondary | ICD-10-CM | POA: Diagnosis not present

## 2015-12-27 DIAGNOSIS — D509 Iron deficiency anemia, unspecified: Secondary | ICD-10-CM | POA: Diagnosis not present

## 2015-12-27 DIAGNOSIS — D631 Anemia in chronic kidney disease: Secondary | ICD-10-CM | POA: Diagnosis not present

## 2015-12-27 DIAGNOSIS — N2581 Secondary hyperparathyroidism of renal origin: Secondary | ICD-10-CM | POA: Diagnosis not present

## 2015-12-27 DIAGNOSIS — Z992 Dependence on renal dialysis: Secondary | ICD-10-CM | POA: Diagnosis not present

## 2015-12-27 DIAGNOSIS — N186 End stage renal disease: Secondary | ICD-10-CM | POA: Diagnosis not present

## 2015-12-29 DIAGNOSIS — D631 Anemia in chronic kidney disease: Secondary | ICD-10-CM | POA: Diagnosis not present

## 2015-12-29 DIAGNOSIS — N2581 Secondary hyperparathyroidism of renal origin: Secondary | ICD-10-CM | POA: Diagnosis not present

## 2015-12-29 DIAGNOSIS — D509 Iron deficiency anemia, unspecified: Secondary | ICD-10-CM | POA: Diagnosis not present

## 2015-12-29 DIAGNOSIS — N186 End stage renal disease: Secondary | ICD-10-CM | POA: Diagnosis not present

## 2015-12-29 DIAGNOSIS — Z992 Dependence on renal dialysis: Secondary | ICD-10-CM | POA: Diagnosis not present

## 2015-12-30 ENCOUNTER — Other Ambulatory Visit: Payer: Self-pay | Admitting: Cardiology

## 2015-12-31 DIAGNOSIS — Z992 Dependence on renal dialysis: Secondary | ICD-10-CM | POA: Diagnosis not present

## 2015-12-31 DIAGNOSIS — N2581 Secondary hyperparathyroidism of renal origin: Secondary | ICD-10-CM | POA: Diagnosis not present

## 2015-12-31 DIAGNOSIS — D631 Anemia in chronic kidney disease: Secondary | ICD-10-CM | POA: Diagnosis not present

## 2015-12-31 DIAGNOSIS — N186 End stage renal disease: Secondary | ICD-10-CM | POA: Diagnosis not present

## 2015-12-31 DIAGNOSIS — D509 Iron deficiency anemia, unspecified: Secondary | ICD-10-CM | POA: Diagnosis not present

## 2016-01-03 DIAGNOSIS — N2581 Secondary hyperparathyroidism of renal origin: Secondary | ICD-10-CM | POA: Diagnosis not present

## 2016-01-03 DIAGNOSIS — D509 Iron deficiency anemia, unspecified: Secondary | ICD-10-CM | POA: Diagnosis not present

## 2016-01-03 DIAGNOSIS — N186 End stage renal disease: Secondary | ICD-10-CM | POA: Diagnosis not present

## 2016-01-03 DIAGNOSIS — D631 Anemia in chronic kidney disease: Secondary | ICD-10-CM | POA: Diagnosis not present

## 2016-01-03 DIAGNOSIS — Z992 Dependence on renal dialysis: Secondary | ICD-10-CM | POA: Diagnosis not present

## 2016-01-05 DIAGNOSIS — N186 End stage renal disease: Secondary | ICD-10-CM | POA: Diagnosis not present

## 2016-01-05 DIAGNOSIS — D509 Iron deficiency anemia, unspecified: Secondary | ICD-10-CM | POA: Diagnosis not present

## 2016-01-05 DIAGNOSIS — Z992 Dependence on renal dialysis: Secondary | ICD-10-CM | POA: Diagnosis not present

## 2016-01-05 DIAGNOSIS — N2581 Secondary hyperparathyroidism of renal origin: Secondary | ICD-10-CM | POA: Diagnosis not present

## 2016-01-05 DIAGNOSIS — D631 Anemia in chronic kidney disease: Secondary | ICD-10-CM | POA: Diagnosis not present

## 2016-01-06 DIAGNOSIS — N186 End stage renal disease: Secondary | ICD-10-CM | POA: Diagnosis not present

## 2016-01-06 DIAGNOSIS — Z992 Dependence on renal dialysis: Secondary | ICD-10-CM | POA: Diagnosis not present

## 2016-01-07 DIAGNOSIS — N2581 Secondary hyperparathyroidism of renal origin: Secondary | ICD-10-CM | POA: Diagnosis not present

## 2016-01-07 DIAGNOSIS — D631 Anemia in chronic kidney disease: Secondary | ICD-10-CM | POA: Diagnosis not present

## 2016-01-07 DIAGNOSIS — Z992 Dependence on renal dialysis: Secondary | ICD-10-CM | POA: Diagnosis not present

## 2016-01-07 DIAGNOSIS — N186 End stage renal disease: Secondary | ICD-10-CM | POA: Diagnosis not present

## 2016-01-07 DIAGNOSIS — D509 Iron deficiency anemia, unspecified: Secondary | ICD-10-CM | POA: Diagnosis not present

## 2016-01-10 DIAGNOSIS — D509 Iron deficiency anemia, unspecified: Secondary | ICD-10-CM | POA: Diagnosis not present

## 2016-01-10 DIAGNOSIS — Z992 Dependence on renal dialysis: Secondary | ICD-10-CM | POA: Diagnosis not present

## 2016-01-10 DIAGNOSIS — N2581 Secondary hyperparathyroidism of renal origin: Secondary | ICD-10-CM | POA: Diagnosis not present

## 2016-01-10 DIAGNOSIS — D631 Anemia in chronic kidney disease: Secondary | ICD-10-CM | POA: Diagnosis not present

## 2016-01-10 DIAGNOSIS — N186 End stage renal disease: Secondary | ICD-10-CM | POA: Diagnosis not present

## 2016-01-12 ENCOUNTER — Other Ambulatory Visit: Payer: Self-pay | Admitting: Cardiology

## 2016-01-12 DIAGNOSIS — D509 Iron deficiency anemia, unspecified: Secondary | ICD-10-CM | POA: Diagnosis not present

## 2016-01-12 DIAGNOSIS — N186 End stage renal disease: Secondary | ICD-10-CM | POA: Diagnosis not present

## 2016-01-12 DIAGNOSIS — N2581 Secondary hyperparathyroidism of renal origin: Secondary | ICD-10-CM | POA: Diagnosis not present

## 2016-01-12 DIAGNOSIS — Z992 Dependence on renal dialysis: Secondary | ICD-10-CM | POA: Diagnosis not present

## 2016-01-12 DIAGNOSIS — D631 Anemia in chronic kidney disease: Secondary | ICD-10-CM | POA: Diagnosis not present

## 2016-01-14 DIAGNOSIS — N186 End stage renal disease: Secondary | ICD-10-CM | POA: Diagnosis not present

## 2016-01-14 DIAGNOSIS — N2581 Secondary hyperparathyroidism of renal origin: Secondary | ICD-10-CM | POA: Diagnosis not present

## 2016-01-14 DIAGNOSIS — Z992 Dependence on renal dialysis: Secondary | ICD-10-CM | POA: Diagnosis not present

## 2016-01-14 DIAGNOSIS — D631 Anemia in chronic kidney disease: Secondary | ICD-10-CM | POA: Diagnosis not present

## 2016-01-14 DIAGNOSIS — D509 Iron deficiency anemia, unspecified: Secondary | ICD-10-CM | POA: Diagnosis not present

## 2016-01-17 DIAGNOSIS — D631 Anemia in chronic kidney disease: Secondary | ICD-10-CM | POA: Diagnosis not present

## 2016-01-17 DIAGNOSIS — Z992 Dependence on renal dialysis: Secondary | ICD-10-CM | POA: Diagnosis not present

## 2016-01-17 DIAGNOSIS — N2581 Secondary hyperparathyroidism of renal origin: Secondary | ICD-10-CM | POA: Diagnosis not present

## 2016-01-17 DIAGNOSIS — D509 Iron deficiency anemia, unspecified: Secondary | ICD-10-CM | POA: Diagnosis not present

## 2016-01-17 DIAGNOSIS — N186 End stage renal disease: Secondary | ICD-10-CM | POA: Diagnosis not present

## 2016-01-19 DIAGNOSIS — N2581 Secondary hyperparathyroidism of renal origin: Secondary | ICD-10-CM | POA: Diagnosis not present

## 2016-01-19 DIAGNOSIS — Z992 Dependence on renal dialysis: Secondary | ICD-10-CM | POA: Diagnosis not present

## 2016-01-19 DIAGNOSIS — N186 End stage renal disease: Secondary | ICD-10-CM | POA: Diagnosis not present

## 2016-01-19 DIAGNOSIS — D631 Anemia in chronic kidney disease: Secondary | ICD-10-CM | POA: Diagnosis not present

## 2016-01-19 DIAGNOSIS — D509 Iron deficiency anemia, unspecified: Secondary | ICD-10-CM | POA: Diagnosis not present

## 2016-01-21 DIAGNOSIS — N186 End stage renal disease: Secondary | ICD-10-CM | POA: Diagnosis not present

## 2016-01-21 DIAGNOSIS — D509 Iron deficiency anemia, unspecified: Secondary | ICD-10-CM | POA: Diagnosis not present

## 2016-01-21 DIAGNOSIS — D631 Anemia in chronic kidney disease: Secondary | ICD-10-CM | POA: Diagnosis not present

## 2016-01-21 DIAGNOSIS — N2581 Secondary hyperparathyroidism of renal origin: Secondary | ICD-10-CM | POA: Diagnosis not present

## 2016-01-21 DIAGNOSIS — Z992 Dependence on renal dialysis: Secondary | ICD-10-CM | POA: Diagnosis not present

## 2016-01-24 DIAGNOSIS — N186 End stage renal disease: Secondary | ICD-10-CM | POA: Diagnosis not present

## 2016-01-24 DIAGNOSIS — Z992 Dependence on renal dialysis: Secondary | ICD-10-CM | POA: Diagnosis not present

## 2016-01-24 DIAGNOSIS — N2581 Secondary hyperparathyroidism of renal origin: Secondary | ICD-10-CM | POA: Diagnosis not present

## 2016-01-24 DIAGNOSIS — D509 Iron deficiency anemia, unspecified: Secondary | ICD-10-CM | POA: Diagnosis not present

## 2016-01-24 DIAGNOSIS — D631 Anemia in chronic kidney disease: Secondary | ICD-10-CM | POA: Diagnosis not present

## 2016-01-26 DIAGNOSIS — N2581 Secondary hyperparathyroidism of renal origin: Secondary | ICD-10-CM | POA: Diagnosis not present

## 2016-01-26 DIAGNOSIS — D631 Anemia in chronic kidney disease: Secondary | ICD-10-CM | POA: Diagnosis not present

## 2016-01-26 DIAGNOSIS — Z992 Dependence on renal dialysis: Secondary | ICD-10-CM | POA: Diagnosis not present

## 2016-01-26 DIAGNOSIS — N186 End stage renal disease: Secondary | ICD-10-CM | POA: Diagnosis not present

## 2016-01-26 DIAGNOSIS — D509 Iron deficiency anemia, unspecified: Secondary | ICD-10-CM | POA: Diagnosis not present

## 2016-01-28 DIAGNOSIS — D631 Anemia in chronic kidney disease: Secondary | ICD-10-CM | POA: Diagnosis not present

## 2016-01-28 DIAGNOSIS — N2581 Secondary hyperparathyroidism of renal origin: Secondary | ICD-10-CM | POA: Diagnosis not present

## 2016-01-28 DIAGNOSIS — N186 End stage renal disease: Secondary | ICD-10-CM | POA: Diagnosis not present

## 2016-01-28 DIAGNOSIS — D509 Iron deficiency anemia, unspecified: Secondary | ICD-10-CM | POA: Diagnosis not present

## 2016-01-28 DIAGNOSIS — Z992 Dependence on renal dialysis: Secondary | ICD-10-CM | POA: Diagnosis not present

## 2016-01-31 DIAGNOSIS — Z992 Dependence on renal dialysis: Secondary | ICD-10-CM | POA: Diagnosis not present

## 2016-01-31 DIAGNOSIS — N2581 Secondary hyperparathyroidism of renal origin: Secondary | ICD-10-CM | POA: Diagnosis not present

## 2016-01-31 DIAGNOSIS — D631 Anemia in chronic kidney disease: Secondary | ICD-10-CM | POA: Diagnosis not present

## 2016-01-31 DIAGNOSIS — N186 End stage renal disease: Secondary | ICD-10-CM | POA: Diagnosis not present

## 2016-01-31 DIAGNOSIS — D509 Iron deficiency anemia, unspecified: Secondary | ICD-10-CM | POA: Diagnosis not present

## 2016-02-02 DIAGNOSIS — N186 End stage renal disease: Secondary | ICD-10-CM | POA: Diagnosis not present

## 2016-02-02 DIAGNOSIS — D509 Iron deficiency anemia, unspecified: Secondary | ICD-10-CM | POA: Diagnosis not present

## 2016-02-02 DIAGNOSIS — Z992 Dependence on renal dialysis: Secondary | ICD-10-CM | POA: Diagnosis not present

## 2016-02-02 DIAGNOSIS — N2581 Secondary hyperparathyroidism of renal origin: Secondary | ICD-10-CM | POA: Diagnosis not present

## 2016-02-02 DIAGNOSIS — D631 Anemia in chronic kidney disease: Secondary | ICD-10-CM | POA: Diagnosis not present

## 2016-02-03 ENCOUNTER — Encounter: Payer: Self-pay | Admitting: Family

## 2016-02-04 DIAGNOSIS — Z992 Dependence on renal dialysis: Secondary | ICD-10-CM | POA: Diagnosis not present

## 2016-02-04 DIAGNOSIS — D631 Anemia in chronic kidney disease: Secondary | ICD-10-CM | POA: Diagnosis not present

## 2016-02-04 DIAGNOSIS — N186 End stage renal disease: Secondary | ICD-10-CM | POA: Diagnosis not present

## 2016-02-04 DIAGNOSIS — D509 Iron deficiency anemia, unspecified: Secondary | ICD-10-CM | POA: Diagnosis not present

## 2016-02-04 DIAGNOSIS — N2581 Secondary hyperparathyroidism of renal origin: Secondary | ICD-10-CM | POA: Diagnosis not present

## 2016-02-06 DIAGNOSIS — N186 End stage renal disease: Secondary | ICD-10-CM | POA: Diagnosis not present

## 2016-02-06 DIAGNOSIS — Z992 Dependence on renal dialysis: Secondary | ICD-10-CM | POA: Diagnosis not present

## 2016-02-07 DIAGNOSIS — N186 End stage renal disease: Secondary | ICD-10-CM | POA: Diagnosis not present

## 2016-02-07 DIAGNOSIS — D509 Iron deficiency anemia, unspecified: Secondary | ICD-10-CM | POA: Diagnosis not present

## 2016-02-07 DIAGNOSIS — N2581 Secondary hyperparathyroidism of renal origin: Secondary | ICD-10-CM | POA: Diagnosis not present

## 2016-02-07 DIAGNOSIS — R110 Nausea: Secondary | ICD-10-CM | POA: Diagnosis not present

## 2016-02-07 DIAGNOSIS — D631 Anemia in chronic kidney disease: Secondary | ICD-10-CM | POA: Diagnosis not present

## 2016-02-07 DIAGNOSIS — Z992 Dependence on renal dialysis: Secondary | ICD-10-CM | POA: Diagnosis not present

## 2016-02-09 ENCOUNTER — Ambulatory Visit (HOSPITAL_COMMUNITY): Admission: RE | Admit: 2016-02-09 | Payer: Medicare Other | Source: Ambulatory Visit

## 2016-02-09 ENCOUNTER — Ambulatory Visit: Payer: Medicare Other | Admitting: Family

## 2016-02-09 DIAGNOSIS — N2581 Secondary hyperparathyroidism of renal origin: Secondary | ICD-10-CM | POA: Diagnosis not present

## 2016-02-09 DIAGNOSIS — N186 End stage renal disease: Secondary | ICD-10-CM | POA: Diagnosis not present

## 2016-02-09 DIAGNOSIS — Z992 Dependence on renal dialysis: Secondary | ICD-10-CM | POA: Diagnosis not present

## 2016-02-09 DIAGNOSIS — D631 Anemia in chronic kidney disease: Secondary | ICD-10-CM | POA: Diagnosis not present

## 2016-02-09 DIAGNOSIS — D509 Iron deficiency anemia, unspecified: Secondary | ICD-10-CM | POA: Diagnosis not present

## 2016-02-09 DIAGNOSIS — R110 Nausea: Secondary | ICD-10-CM | POA: Diagnosis not present

## 2016-02-11 DIAGNOSIS — Z992 Dependence on renal dialysis: Secondary | ICD-10-CM | POA: Diagnosis not present

## 2016-02-11 DIAGNOSIS — N2581 Secondary hyperparathyroidism of renal origin: Secondary | ICD-10-CM | POA: Diagnosis not present

## 2016-02-11 DIAGNOSIS — N186 End stage renal disease: Secondary | ICD-10-CM | POA: Diagnosis not present

## 2016-02-11 DIAGNOSIS — D509 Iron deficiency anemia, unspecified: Secondary | ICD-10-CM | POA: Diagnosis not present

## 2016-02-11 DIAGNOSIS — D631 Anemia in chronic kidney disease: Secondary | ICD-10-CM | POA: Diagnosis not present

## 2016-02-11 DIAGNOSIS — R110 Nausea: Secondary | ICD-10-CM | POA: Diagnosis not present

## 2016-02-14 DIAGNOSIS — Z992 Dependence on renal dialysis: Secondary | ICD-10-CM | POA: Diagnosis not present

## 2016-02-14 DIAGNOSIS — D631 Anemia in chronic kidney disease: Secondary | ICD-10-CM | POA: Diagnosis not present

## 2016-02-14 DIAGNOSIS — N186 End stage renal disease: Secondary | ICD-10-CM | POA: Diagnosis not present

## 2016-02-14 DIAGNOSIS — N2581 Secondary hyperparathyroidism of renal origin: Secondary | ICD-10-CM | POA: Diagnosis not present

## 2016-02-14 DIAGNOSIS — D509 Iron deficiency anemia, unspecified: Secondary | ICD-10-CM | POA: Diagnosis not present

## 2016-02-14 DIAGNOSIS — R110 Nausea: Secondary | ICD-10-CM | POA: Diagnosis not present

## 2016-02-16 DIAGNOSIS — D509 Iron deficiency anemia, unspecified: Secondary | ICD-10-CM | POA: Diagnosis not present

## 2016-02-16 DIAGNOSIS — R110 Nausea: Secondary | ICD-10-CM | POA: Diagnosis not present

## 2016-02-16 DIAGNOSIS — N186 End stage renal disease: Secondary | ICD-10-CM | POA: Diagnosis not present

## 2016-02-16 DIAGNOSIS — H60501 Unspecified acute noninfective otitis externa, right ear: Secondary | ICD-10-CM | POA: Diagnosis not present

## 2016-02-16 DIAGNOSIS — Z992 Dependence on renal dialysis: Secondary | ICD-10-CM | POA: Diagnosis not present

## 2016-02-16 DIAGNOSIS — D631 Anemia in chronic kidney disease: Secondary | ICD-10-CM | POA: Diagnosis not present

## 2016-02-16 DIAGNOSIS — N2581 Secondary hyperparathyroidism of renal origin: Secondary | ICD-10-CM | POA: Diagnosis not present

## 2016-02-18 DIAGNOSIS — Z992 Dependence on renal dialysis: Secondary | ICD-10-CM | POA: Diagnosis not present

## 2016-02-18 DIAGNOSIS — R110 Nausea: Secondary | ICD-10-CM | POA: Diagnosis not present

## 2016-02-18 DIAGNOSIS — D631 Anemia in chronic kidney disease: Secondary | ICD-10-CM | POA: Diagnosis not present

## 2016-02-18 DIAGNOSIS — N186 End stage renal disease: Secondary | ICD-10-CM | POA: Diagnosis not present

## 2016-02-18 DIAGNOSIS — D509 Iron deficiency anemia, unspecified: Secondary | ICD-10-CM | POA: Diagnosis not present

## 2016-02-18 DIAGNOSIS — N2581 Secondary hyperparathyroidism of renal origin: Secondary | ICD-10-CM | POA: Diagnosis not present

## 2016-02-21 DIAGNOSIS — D509 Iron deficiency anemia, unspecified: Secondary | ICD-10-CM | POA: Diagnosis not present

## 2016-02-21 DIAGNOSIS — Z992 Dependence on renal dialysis: Secondary | ICD-10-CM | POA: Diagnosis not present

## 2016-02-21 DIAGNOSIS — N186 End stage renal disease: Secondary | ICD-10-CM | POA: Diagnosis not present

## 2016-02-21 DIAGNOSIS — N2581 Secondary hyperparathyroidism of renal origin: Secondary | ICD-10-CM | POA: Diagnosis not present

## 2016-02-21 DIAGNOSIS — R110 Nausea: Secondary | ICD-10-CM | POA: Diagnosis not present

## 2016-02-21 DIAGNOSIS — D631 Anemia in chronic kidney disease: Secondary | ICD-10-CM | POA: Diagnosis not present

## 2016-02-23 DIAGNOSIS — D509 Iron deficiency anemia, unspecified: Secondary | ICD-10-CM | POA: Diagnosis not present

## 2016-02-23 DIAGNOSIS — D631 Anemia in chronic kidney disease: Secondary | ICD-10-CM | POA: Diagnosis not present

## 2016-02-23 DIAGNOSIS — R110 Nausea: Secondary | ICD-10-CM | POA: Diagnosis not present

## 2016-02-23 DIAGNOSIS — Z992 Dependence on renal dialysis: Secondary | ICD-10-CM | POA: Diagnosis not present

## 2016-02-23 DIAGNOSIS — N2581 Secondary hyperparathyroidism of renal origin: Secondary | ICD-10-CM | POA: Diagnosis not present

## 2016-02-23 DIAGNOSIS — N186 End stage renal disease: Secondary | ICD-10-CM | POA: Diagnosis not present

## 2016-02-26 DIAGNOSIS — R110 Nausea: Secondary | ICD-10-CM | POA: Diagnosis not present

## 2016-02-26 DIAGNOSIS — N2581 Secondary hyperparathyroidism of renal origin: Secondary | ICD-10-CM | POA: Diagnosis not present

## 2016-02-26 DIAGNOSIS — D509 Iron deficiency anemia, unspecified: Secondary | ICD-10-CM | POA: Diagnosis not present

## 2016-02-26 DIAGNOSIS — D631 Anemia in chronic kidney disease: Secondary | ICD-10-CM | POA: Diagnosis not present

## 2016-02-26 DIAGNOSIS — N186 End stage renal disease: Secondary | ICD-10-CM | POA: Diagnosis not present

## 2016-02-26 DIAGNOSIS — Z992 Dependence on renal dialysis: Secondary | ICD-10-CM | POA: Diagnosis not present

## 2016-02-28 DIAGNOSIS — N2581 Secondary hyperparathyroidism of renal origin: Secondary | ICD-10-CM | POA: Diagnosis not present

## 2016-02-28 DIAGNOSIS — Z79899 Other long term (current) drug therapy: Secondary | ICD-10-CM | POA: Diagnosis not present

## 2016-02-28 DIAGNOSIS — M1990 Unspecified osteoarthritis, unspecified site: Secondary | ICD-10-CM | POA: Diagnosis not present

## 2016-02-28 DIAGNOSIS — R110 Nausea: Secondary | ICD-10-CM | POA: Diagnosis not present

## 2016-02-28 DIAGNOSIS — T360X5A Adverse effect of penicillins, initial encounter: Secondary | ICD-10-CM | POA: Diagnosis not present

## 2016-02-28 DIAGNOSIS — Z992 Dependence on renal dialysis: Secondary | ICD-10-CM | POA: Diagnosis not present

## 2016-02-28 DIAGNOSIS — H6692 Otitis media, unspecified, left ear: Secondary | ICD-10-CM | POA: Diagnosis not present

## 2016-02-28 DIAGNOSIS — H7091 Unspecified mastoiditis, right ear: Secondary | ICD-10-CM | POA: Diagnosis not present

## 2016-02-28 DIAGNOSIS — E039 Hypothyroidism, unspecified: Secondary | ICD-10-CM | POA: Diagnosis not present

## 2016-02-28 DIAGNOSIS — Z791 Long term (current) use of non-steroidal anti-inflammatories (NSAID): Secondary | ICD-10-CM | POA: Diagnosis not present

## 2016-02-28 DIAGNOSIS — Z9889 Other specified postprocedural states: Secondary | ICD-10-CM | POA: Diagnosis not present

## 2016-02-28 DIAGNOSIS — N186 End stage renal disease: Secondary | ICD-10-CM | POA: Diagnosis not present

## 2016-02-28 DIAGNOSIS — D509 Iron deficiency anemia, unspecified: Secondary | ICD-10-CM | POA: Diagnosis not present

## 2016-02-28 DIAGNOSIS — R1110 Vomiting, unspecified: Secondary | ICD-10-CM | POA: Diagnosis not present

## 2016-02-28 DIAGNOSIS — E785 Hyperlipidemia, unspecified: Secondary | ICD-10-CM | POA: Diagnosis not present

## 2016-02-28 DIAGNOSIS — F419 Anxiety disorder, unspecified: Secondary | ICD-10-CM | POA: Diagnosis not present

## 2016-02-28 DIAGNOSIS — H6691 Otitis media, unspecified, right ear: Secondary | ICD-10-CM | POA: Diagnosis not present

## 2016-02-28 DIAGNOSIS — Z87891 Personal history of nicotine dependence: Secondary | ICD-10-CM | POA: Diagnosis not present

## 2016-02-28 DIAGNOSIS — Z7982 Long term (current) use of aspirin: Secondary | ICD-10-CM | POA: Diagnosis not present

## 2016-02-28 DIAGNOSIS — D631 Anemia in chronic kidney disease: Secondary | ICD-10-CM | POA: Diagnosis not present

## 2016-03-01 DIAGNOSIS — N2581 Secondary hyperparathyroidism of renal origin: Secondary | ICD-10-CM | POA: Diagnosis not present

## 2016-03-01 DIAGNOSIS — D631 Anemia in chronic kidney disease: Secondary | ICD-10-CM | POA: Diagnosis not present

## 2016-03-01 DIAGNOSIS — Z992 Dependence on renal dialysis: Secondary | ICD-10-CM | POA: Diagnosis not present

## 2016-03-01 DIAGNOSIS — D509 Iron deficiency anemia, unspecified: Secondary | ICD-10-CM | POA: Diagnosis not present

## 2016-03-01 DIAGNOSIS — N186 End stage renal disease: Secondary | ICD-10-CM | POA: Diagnosis not present

## 2016-03-01 DIAGNOSIS — R110 Nausea: Secondary | ICD-10-CM | POA: Diagnosis not present

## 2016-03-03 DIAGNOSIS — N2581 Secondary hyperparathyroidism of renal origin: Secondary | ICD-10-CM | POA: Diagnosis not present

## 2016-03-03 DIAGNOSIS — R110 Nausea: Secondary | ICD-10-CM | POA: Diagnosis not present

## 2016-03-03 DIAGNOSIS — D631 Anemia in chronic kidney disease: Secondary | ICD-10-CM | POA: Diagnosis not present

## 2016-03-03 DIAGNOSIS — Z992 Dependence on renal dialysis: Secondary | ICD-10-CM | POA: Diagnosis not present

## 2016-03-03 DIAGNOSIS — N186 End stage renal disease: Secondary | ICD-10-CM | POA: Diagnosis not present

## 2016-03-03 DIAGNOSIS — D509 Iron deficiency anemia, unspecified: Secondary | ICD-10-CM | POA: Diagnosis not present

## 2016-03-06 DIAGNOSIS — R110 Nausea: Secondary | ICD-10-CM | POA: Diagnosis not present

## 2016-03-06 DIAGNOSIS — N186 End stage renal disease: Secondary | ICD-10-CM | POA: Diagnosis not present

## 2016-03-06 DIAGNOSIS — N2581 Secondary hyperparathyroidism of renal origin: Secondary | ICD-10-CM | POA: Diagnosis not present

## 2016-03-06 DIAGNOSIS — D509 Iron deficiency anemia, unspecified: Secondary | ICD-10-CM | POA: Diagnosis not present

## 2016-03-06 DIAGNOSIS — D631 Anemia in chronic kidney disease: Secondary | ICD-10-CM | POA: Diagnosis not present

## 2016-03-06 DIAGNOSIS — Z992 Dependence on renal dialysis: Secondary | ICD-10-CM | POA: Diagnosis not present

## 2016-03-07 ENCOUNTER — Encounter (HOSPITAL_COMMUNITY): Payer: Self-pay | Admitting: Psychiatry

## 2016-03-07 ENCOUNTER — Ambulatory Visit (INDEPENDENT_AMBULATORY_CARE_PROVIDER_SITE_OTHER): Payer: Medicare Other | Admitting: Psychiatry

## 2016-03-07 VITALS — BP 181/90 | HR 83 | Ht <= 58 in | Wt 206.0 lb

## 2016-03-07 DIAGNOSIS — I6523 Occlusion and stenosis of bilateral carotid arteries: Secondary | ICD-10-CM

## 2016-03-07 DIAGNOSIS — F331 Major depressive disorder, recurrent, moderate: Secondary | ICD-10-CM | POA: Diagnosis not present

## 2016-03-07 DIAGNOSIS — F411 Generalized anxiety disorder: Secondary | ICD-10-CM

## 2016-03-07 MED ORDER — CLONAZEPAM 0.5 MG PO TABS: 0.5000 mg | ORAL_TABLET | Freq: Four times a day (QID) | ORAL | 3 refills | Status: DC

## 2016-03-07 MED ORDER — SERTRALINE HCL 100 MG PO TABS: 100.0000 mg | ORAL_TABLET | Freq: Every day | ORAL | 3 refills | Status: DC
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
## 2016-03-08 DIAGNOSIS — N2581 Secondary hyperparathyroidism of renal origin: Secondary | ICD-10-CM | POA: Diagnosis not present

## 2016-03-08 DIAGNOSIS — R110 Nausea: Secondary | ICD-10-CM | POA: Diagnosis not present

## 2016-03-08 DIAGNOSIS — N186 End stage renal disease: Secondary | ICD-10-CM | POA: Diagnosis not present

## 2016-03-08 DIAGNOSIS — D509 Iron deficiency anemia, unspecified: Secondary | ICD-10-CM | POA: Diagnosis not present

## 2016-03-08 DIAGNOSIS — D631 Anemia in chronic kidney disease: Secondary | ICD-10-CM | POA: Diagnosis not present

## 2016-03-08 DIAGNOSIS — Z992 Dependence on renal dialysis: Secondary | ICD-10-CM | POA: Diagnosis not present

## 2016-03-10 DIAGNOSIS — N186 End stage renal disease: Secondary | ICD-10-CM | POA: Diagnosis not present

## 2016-03-10 DIAGNOSIS — N2581 Secondary hyperparathyroidism of renal origin: Secondary | ICD-10-CM | POA: Diagnosis not present

## 2016-03-10 DIAGNOSIS — Z23 Encounter for immunization: Secondary | ICD-10-CM | POA: Diagnosis not present

## 2016-03-10 DIAGNOSIS — Z992 Dependence on renal dialysis: Secondary | ICD-10-CM | POA: Diagnosis not present

## 2016-03-10 DIAGNOSIS — D509 Iron deficiency anemia, unspecified: Secondary | ICD-10-CM | POA: Diagnosis not present

## 2016-03-10 DIAGNOSIS — D631 Anemia in chronic kidney disease: Secondary | ICD-10-CM | POA: Diagnosis not present

## 2016-03-13 DIAGNOSIS — Z23 Encounter for immunization: Secondary | ICD-10-CM | POA: Diagnosis not present

## 2016-03-13 DIAGNOSIS — Z992 Dependence on renal dialysis: Secondary | ICD-10-CM | POA: Diagnosis not present

## 2016-03-13 DIAGNOSIS — D509 Iron deficiency anemia, unspecified: Secondary | ICD-10-CM | POA: Diagnosis not present

## 2016-03-13 DIAGNOSIS — N2581 Secondary hyperparathyroidism of renal origin: Secondary | ICD-10-CM | POA: Diagnosis not present

## 2016-03-13 DIAGNOSIS — N186 End stage renal disease: Secondary | ICD-10-CM | POA: Diagnosis not present

## 2016-03-13 DIAGNOSIS — D631 Anemia in chronic kidney disease: Secondary | ICD-10-CM | POA: Diagnosis not present

## 2016-03-15 DIAGNOSIS — N186 End stage renal disease: Secondary | ICD-10-CM | POA: Diagnosis not present

## 2016-03-15 DIAGNOSIS — D509 Iron deficiency anemia, unspecified: Secondary | ICD-10-CM | POA: Diagnosis not present

## 2016-03-15 DIAGNOSIS — N2581 Secondary hyperparathyroidism of renal origin: Secondary | ICD-10-CM | POA: Diagnosis not present

## 2016-03-15 DIAGNOSIS — Z23 Encounter for immunization: Secondary | ICD-10-CM | POA: Diagnosis not present

## 2016-03-15 DIAGNOSIS — Z992 Dependence on renal dialysis: Secondary | ICD-10-CM | POA: Diagnosis not present

## 2016-03-15 DIAGNOSIS — D631 Anemia in chronic kidney disease: Secondary | ICD-10-CM | POA: Diagnosis not present

## 2016-03-17 DIAGNOSIS — N2581 Secondary hyperparathyroidism of renal origin: Secondary | ICD-10-CM | POA: Diagnosis not present

## 2016-03-17 DIAGNOSIS — D509 Iron deficiency anemia, unspecified: Secondary | ICD-10-CM | POA: Diagnosis not present

## 2016-03-17 DIAGNOSIS — N186 End stage renal disease: Secondary | ICD-10-CM | POA: Diagnosis not present

## 2016-03-17 DIAGNOSIS — Z992 Dependence on renal dialysis: Secondary | ICD-10-CM | POA: Diagnosis not present

## 2016-03-17 DIAGNOSIS — Z23 Encounter for immunization: Secondary | ICD-10-CM | POA: Diagnosis not present

## 2016-03-17 DIAGNOSIS — D631 Anemia in chronic kidney disease: Secondary | ICD-10-CM | POA: Diagnosis not present

## 2016-03-19 DIAGNOSIS — D509 Iron deficiency anemia, unspecified: Secondary | ICD-10-CM | POA: Diagnosis not present

## 2016-03-19 DIAGNOSIS — Z23 Encounter for immunization: Secondary | ICD-10-CM | POA: Diagnosis not present

## 2016-03-19 DIAGNOSIS — N2581 Secondary hyperparathyroidism of renal origin: Secondary | ICD-10-CM | POA: Diagnosis not present

## 2016-03-19 DIAGNOSIS — D631 Anemia in chronic kidney disease: Secondary | ICD-10-CM | POA: Diagnosis not present

## 2016-03-19 DIAGNOSIS — N186 End stage renal disease: Secondary | ICD-10-CM | POA: Diagnosis not present

## 2016-03-19 DIAGNOSIS — Z992 Dependence on renal dialysis: Secondary | ICD-10-CM | POA: Diagnosis not present

## 2016-03-22 DIAGNOSIS — Z23 Encounter for immunization: Secondary | ICD-10-CM | POA: Diagnosis not present

## 2016-03-22 DIAGNOSIS — D631 Anemia in chronic kidney disease: Secondary | ICD-10-CM | POA: Diagnosis not present

## 2016-03-22 DIAGNOSIS — Z992 Dependence on renal dialysis: Secondary | ICD-10-CM | POA: Diagnosis not present

## 2016-03-22 DIAGNOSIS — N186 End stage renal disease: Secondary | ICD-10-CM | POA: Diagnosis not present

## 2016-03-22 DIAGNOSIS — N2581 Secondary hyperparathyroidism of renal origin: Secondary | ICD-10-CM | POA: Diagnosis not present

## 2016-03-22 DIAGNOSIS — D509 Iron deficiency anemia, unspecified: Secondary | ICD-10-CM | POA: Diagnosis not present

## 2016-03-24 DIAGNOSIS — D631 Anemia in chronic kidney disease: Secondary | ICD-10-CM | POA: Diagnosis not present

## 2016-03-24 DIAGNOSIS — Z23 Encounter for immunization: Secondary | ICD-10-CM | POA: Diagnosis not present

## 2016-03-24 DIAGNOSIS — D509 Iron deficiency anemia, unspecified: Secondary | ICD-10-CM | POA: Diagnosis not present

## 2016-03-24 DIAGNOSIS — Z992 Dependence on renal dialysis: Secondary | ICD-10-CM | POA: Diagnosis not present

## 2016-03-24 DIAGNOSIS — N186 End stage renal disease: Secondary | ICD-10-CM | POA: Diagnosis not present

## 2016-03-24 DIAGNOSIS — N2581 Secondary hyperparathyroidism of renal origin: Secondary | ICD-10-CM | POA: Diagnosis not present

## 2016-03-27 DIAGNOSIS — N2581 Secondary hyperparathyroidism of renal origin: Secondary | ICD-10-CM | POA: Diagnosis not present

## 2016-03-27 DIAGNOSIS — D509 Iron deficiency anemia, unspecified: Secondary | ICD-10-CM | POA: Diagnosis not present

## 2016-03-27 DIAGNOSIS — D631 Anemia in chronic kidney disease: Secondary | ICD-10-CM | POA: Diagnosis not present

## 2016-03-27 DIAGNOSIS — N186 End stage renal disease: Secondary | ICD-10-CM | POA: Diagnosis not present

## 2016-03-27 DIAGNOSIS — Z23 Encounter for immunization: Secondary | ICD-10-CM | POA: Diagnosis not present

## 2016-03-27 DIAGNOSIS — Z992 Dependence on renal dialysis: Secondary | ICD-10-CM | POA: Diagnosis not present

## 2016-03-29 DIAGNOSIS — N2581 Secondary hyperparathyroidism of renal origin: Secondary | ICD-10-CM | POA: Diagnosis not present

## 2016-03-29 DIAGNOSIS — Z992 Dependence on renal dialysis: Secondary | ICD-10-CM | POA: Diagnosis not present

## 2016-03-29 DIAGNOSIS — N186 End stage renal disease: Secondary | ICD-10-CM | POA: Diagnosis not present

## 2016-03-29 DIAGNOSIS — D509 Iron deficiency anemia, unspecified: Secondary | ICD-10-CM | POA: Diagnosis not present

## 2016-03-29 DIAGNOSIS — Z23 Encounter for immunization: Secondary | ICD-10-CM | POA: Diagnosis not present

## 2016-03-29 DIAGNOSIS — D631 Anemia in chronic kidney disease: Secondary | ICD-10-CM | POA: Diagnosis not present

## 2016-03-31 DIAGNOSIS — D509 Iron deficiency anemia, unspecified: Secondary | ICD-10-CM | POA: Diagnosis not present

## 2016-03-31 DIAGNOSIS — N2581 Secondary hyperparathyroidism of renal origin: Secondary | ICD-10-CM | POA: Diagnosis not present

## 2016-03-31 DIAGNOSIS — Z992 Dependence on renal dialysis: Secondary | ICD-10-CM | POA: Diagnosis not present

## 2016-03-31 DIAGNOSIS — N186 End stage renal disease: Secondary | ICD-10-CM | POA: Diagnosis not present

## 2016-03-31 DIAGNOSIS — D631 Anemia in chronic kidney disease: Secondary | ICD-10-CM | POA: Diagnosis not present

## 2016-03-31 DIAGNOSIS — Z23 Encounter for immunization: Secondary | ICD-10-CM | POA: Diagnosis not present

## 2016-04-03 DIAGNOSIS — D509 Iron deficiency anemia, unspecified: Secondary | ICD-10-CM | POA: Diagnosis not present

## 2016-04-03 DIAGNOSIS — Z23 Encounter for immunization: Secondary | ICD-10-CM | POA: Diagnosis not present

## 2016-04-03 DIAGNOSIS — D631 Anemia in chronic kidney disease: Secondary | ICD-10-CM | POA: Diagnosis not present

## 2016-04-03 DIAGNOSIS — Z992 Dependence on renal dialysis: Secondary | ICD-10-CM | POA: Diagnosis not present

## 2016-04-03 DIAGNOSIS — N186 End stage renal disease: Secondary | ICD-10-CM | POA: Diagnosis not present

## 2016-04-03 DIAGNOSIS — N2581 Secondary hyperparathyroidism of renal origin: Secondary | ICD-10-CM | POA: Diagnosis not present

## 2016-04-05 DIAGNOSIS — N2581 Secondary hyperparathyroidism of renal origin: Secondary | ICD-10-CM | POA: Diagnosis not present

## 2016-04-05 DIAGNOSIS — Z23 Encounter for immunization: Secondary | ICD-10-CM | POA: Diagnosis not present

## 2016-04-05 DIAGNOSIS — N186 End stage renal disease: Secondary | ICD-10-CM | POA: Diagnosis not present

## 2016-04-05 DIAGNOSIS — Z992 Dependence on renal dialysis: Secondary | ICD-10-CM | POA: Diagnosis not present

## 2016-04-05 DIAGNOSIS — D631 Anemia in chronic kidney disease: Secondary | ICD-10-CM | POA: Diagnosis not present

## 2016-04-05 DIAGNOSIS — D509 Iron deficiency anemia, unspecified: Secondary | ICD-10-CM | POA: Diagnosis not present

## 2016-04-07 DIAGNOSIS — D509 Iron deficiency anemia, unspecified: Secondary | ICD-10-CM | POA: Diagnosis not present

## 2016-04-07 DIAGNOSIS — Z23 Encounter for immunization: Secondary | ICD-10-CM | POA: Diagnosis not present

## 2016-04-07 DIAGNOSIS — N2581 Secondary hyperparathyroidism of renal origin: Secondary | ICD-10-CM | POA: Diagnosis not present

## 2016-04-07 DIAGNOSIS — Z992 Dependence on renal dialysis: Secondary | ICD-10-CM | POA: Diagnosis not present

## 2016-04-07 DIAGNOSIS — N186 End stage renal disease: Secondary | ICD-10-CM | POA: Diagnosis not present

## 2016-04-07 DIAGNOSIS — D631 Anemia in chronic kidney disease: Secondary | ICD-10-CM | POA: Diagnosis not present

## 2016-04-10 DIAGNOSIS — N186 End stage renal disease: Secondary | ICD-10-CM | POA: Diagnosis not present

## 2016-04-10 DIAGNOSIS — Z992 Dependence on renal dialysis: Secondary | ICD-10-CM | POA: Diagnosis not present

## 2016-04-10 DIAGNOSIS — N2581 Secondary hyperparathyroidism of renal origin: Secondary | ICD-10-CM | POA: Diagnosis not present

## 2016-04-10 DIAGNOSIS — D631 Anemia in chronic kidney disease: Secondary | ICD-10-CM | POA: Diagnosis not present

## 2016-04-10 DIAGNOSIS — D509 Iron deficiency anemia, unspecified: Secondary | ICD-10-CM | POA: Diagnosis not present

## 2016-04-12 DIAGNOSIS — D509 Iron deficiency anemia, unspecified: Secondary | ICD-10-CM | POA: Diagnosis not present

## 2016-04-12 DIAGNOSIS — N186 End stage renal disease: Secondary | ICD-10-CM | POA: Diagnosis not present

## 2016-04-12 DIAGNOSIS — Z992 Dependence on renal dialysis: Secondary | ICD-10-CM | POA: Diagnosis not present

## 2016-04-12 DIAGNOSIS — D631 Anemia in chronic kidney disease: Secondary | ICD-10-CM | POA: Diagnosis not present

## 2016-04-12 DIAGNOSIS — N2581 Secondary hyperparathyroidism of renal origin: Secondary | ICD-10-CM | POA: Diagnosis not present

## 2016-04-14 DIAGNOSIS — N186 End stage renal disease: Secondary | ICD-10-CM | POA: Diagnosis not present

## 2016-04-14 DIAGNOSIS — N2581 Secondary hyperparathyroidism of renal origin: Secondary | ICD-10-CM | POA: Diagnosis not present

## 2016-04-14 DIAGNOSIS — Z992 Dependence on renal dialysis: Secondary | ICD-10-CM | POA: Diagnosis not present

## 2016-04-14 DIAGNOSIS — D509 Iron deficiency anemia, unspecified: Secondary | ICD-10-CM | POA: Diagnosis not present

## 2016-04-14 DIAGNOSIS — D631 Anemia in chronic kidney disease: Secondary | ICD-10-CM | POA: Diagnosis not present

## 2016-04-17 DIAGNOSIS — D631 Anemia in chronic kidney disease: Secondary | ICD-10-CM | POA: Diagnosis not present

## 2016-04-17 DIAGNOSIS — Z992 Dependence on renal dialysis: Secondary | ICD-10-CM | POA: Diagnosis not present

## 2016-04-17 DIAGNOSIS — D509 Iron deficiency anemia, unspecified: Secondary | ICD-10-CM | POA: Diagnosis not present

## 2016-04-17 DIAGNOSIS — N2581 Secondary hyperparathyroidism of renal origin: Secondary | ICD-10-CM | POA: Diagnosis not present

## 2016-04-17 DIAGNOSIS — N186 End stage renal disease: Secondary | ICD-10-CM | POA: Diagnosis not present

## 2016-04-19 DIAGNOSIS — N186 End stage renal disease: Secondary | ICD-10-CM | POA: Diagnosis not present

## 2016-04-19 DIAGNOSIS — D509 Iron deficiency anemia, unspecified: Secondary | ICD-10-CM | POA: Diagnosis not present

## 2016-04-19 DIAGNOSIS — N2581 Secondary hyperparathyroidism of renal origin: Secondary | ICD-10-CM | POA: Diagnosis not present

## 2016-04-19 DIAGNOSIS — D631 Anemia in chronic kidney disease: Secondary | ICD-10-CM | POA: Diagnosis not present

## 2016-04-19 DIAGNOSIS — Z992 Dependence on renal dialysis: Secondary | ICD-10-CM | POA: Diagnosis not present

## 2016-04-21 DIAGNOSIS — D631 Anemia in chronic kidney disease: Secondary | ICD-10-CM | POA: Diagnosis not present

## 2016-04-21 DIAGNOSIS — N186 End stage renal disease: Secondary | ICD-10-CM | POA: Diagnosis not present

## 2016-04-21 DIAGNOSIS — N2581 Secondary hyperparathyroidism of renal origin: Secondary | ICD-10-CM | POA: Diagnosis not present

## 2016-04-21 DIAGNOSIS — D509 Iron deficiency anemia, unspecified: Secondary | ICD-10-CM | POA: Diagnosis not present

## 2016-04-21 DIAGNOSIS — Z992 Dependence on renal dialysis: Secondary | ICD-10-CM | POA: Diagnosis not present

## 2016-04-24 DIAGNOSIS — N186 End stage renal disease: Secondary | ICD-10-CM | POA: Diagnosis not present

## 2016-04-24 DIAGNOSIS — D509 Iron deficiency anemia, unspecified: Secondary | ICD-10-CM | POA: Diagnosis not present

## 2016-04-24 DIAGNOSIS — N2581 Secondary hyperparathyroidism of renal origin: Secondary | ICD-10-CM | POA: Diagnosis not present

## 2016-04-24 DIAGNOSIS — D631 Anemia in chronic kidney disease: Secondary | ICD-10-CM | POA: Diagnosis not present

## 2016-04-24 DIAGNOSIS — Z992 Dependence on renal dialysis: Secondary | ICD-10-CM | POA: Diagnosis not present

## 2016-04-26 DIAGNOSIS — D509 Iron deficiency anemia, unspecified: Secondary | ICD-10-CM | POA: Diagnosis not present

## 2016-04-26 DIAGNOSIS — N2581 Secondary hyperparathyroidism of renal origin: Secondary | ICD-10-CM | POA: Diagnosis not present

## 2016-04-26 DIAGNOSIS — N186 End stage renal disease: Secondary | ICD-10-CM | POA: Diagnosis not present

## 2016-04-26 DIAGNOSIS — Z992 Dependence on renal dialysis: Secondary | ICD-10-CM | POA: Diagnosis not present

## 2016-04-26 DIAGNOSIS — D631 Anemia in chronic kidney disease: Secondary | ICD-10-CM | POA: Diagnosis not present

## 2016-04-28 DIAGNOSIS — N2581 Secondary hyperparathyroidism of renal origin: Secondary | ICD-10-CM | POA: Diagnosis not present

## 2016-04-28 DIAGNOSIS — Z992 Dependence on renal dialysis: Secondary | ICD-10-CM | POA: Diagnosis not present

## 2016-04-28 DIAGNOSIS — D509 Iron deficiency anemia, unspecified: Secondary | ICD-10-CM | POA: Diagnosis not present

## 2016-04-28 DIAGNOSIS — D631 Anemia in chronic kidney disease: Secondary | ICD-10-CM | POA: Diagnosis not present

## 2016-04-28 DIAGNOSIS — N186 End stage renal disease: Secondary | ICD-10-CM | POA: Diagnosis not present

## 2016-05-01 DIAGNOSIS — N2581 Secondary hyperparathyroidism of renal origin: Secondary | ICD-10-CM | POA: Diagnosis not present

## 2016-05-01 DIAGNOSIS — D509 Iron deficiency anemia, unspecified: Secondary | ICD-10-CM | POA: Diagnosis not present

## 2016-05-01 DIAGNOSIS — Z992 Dependence on renal dialysis: Secondary | ICD-10-CM | POA: Diagnosis not present

## 2016-05-01 DIAGNOSIS — D631 Anemia in chronic kidney disease: Secondary | ICD-10-CM | POA: Diagnosis not present

## 2016-05-01 DIAGNOSIS — N186 End stage renal disease: Secondary | ICD-10-CM | POA: Diagnosis not present

## 2016-05-03 DIAGNOSIS — Z992 Dependence on renal dialysis: Secondary | ICD-10-CM | POA: Diagnosis not present

## 2016-05-03 DIAGNOSIS — D509 Iron deficiency anemia, unspecified: Secondary | ICD-10-CM | POA: Diagnosis not present

## 2016-05-03 DIAGNOSIS — D631 Anemia in chronic kidney disease: Secondary | ICD-10-CM | POA: Diagnosis not present

## 2016-05-03 DIAGNOSIS — N186 End stage renal disease: Secondary | ICD-10-CM | POA: Diagnosis not present

## 2016-05-03 DIAGNOSIS — N2581 Secondary hyperparathyroidism of renal origin: Secondary | ICD-10-CM | POA: Diagnosis not present

## 2016-05-05 DIAGNOSIS — N186 End stage renal disease: Secondary | ICD-10-CM | POA: Diagnosis not present

## 2016-05-05 DIAGNOSIS — D631 Anemia in chronic kidney disease: Secondary | ICD-10-CM | POA: Diagnosis not present

## 2016-05-05 DIAGNOSIS — D509 Iron deficiency anemia, unspecified: Secondary | ICD-10-CM | POA: Diagnosis not present

## 2016-05-05 DIAGNOSIS — N2581 Secondary hyperparathyroidism of renal origin: Secondary | ICD-10-CM | POA: Diagnosis not present

## 2016-05-05 DIAGNOSIS — Z992 Dependence on renal dialysis: Secondary | ICD-10-CM | POA: Diagnosis not present

## 2016-05-08 DIAGNOSIS — N2581 Secondary hyperparathyroidism of renal origin: Secondary | ICD-10-CM | POA: Diagnosis not present

## 2016-05-08 DIAGNOSIS — N186 End stage renal disease: Secondary | ICD-10-CM | POA: Diagnosis not present

## 2016-05-08 DIAGNOSIS — D631 Anemia in chronic kidney disease: Secondary | ICD-10-CM | POA: Diagnosis not present

## 2016-05-08 DIAGNOSIS — D509 Iron deficiency anemia, unspecified: Secondary | ICD-10-CM | POA: Diagnosis not present

## 2016-05-08 DIAGNOSIS — Z992 Dependence on renal dialysis: Secondary | ICD-10-CM | POA: Diagnosis not present

## 2016-05-10 DIAGNOSIS — D509 Iron deficiency anemia, unspecified: Secondary | ICD-10-CM | POA: Diagnosis not present

## 2016-05-10 DIAGNOSIS — N2581 Secondary hyperparathyroidism of renal origin: Secondary | ICD-10-CM | POA: Diagnosis not present

## 2016-05-10 DIAGNOSIS — Z992 Dependence on renal dialysis: Secondary | ICD-10-CM | POA: Diagnosis not present

## 2016-05-10 DIAGNOSIS — N186 End stage renal disease: Secondary | ICD-10-CM | POA: Diagnosis not present

## 2016-05-10 DIAGNOSIS — D631 Anemia in chronic kidney disease: Secondary | ICD-10-CM | POA: Diagnosis not present

## 2016-05-12 DIAGNOSIS — N186 End stage renal disease: Secondary | ICD-10-CM | POA: Diagnosis not present

## 2016-05-12 DIAGNOSIS — D509 Iron deficiency anemia, unspecified: Secondary | ICD-10-CM | POA: Diagnosis not present

## 2016-05-12 DIAGNOSIS — Z992 Dependence on renal dialysis: Secondary | ICD-10-CM | POA: Diagnosis not present

## 2016-05-12 DIAGNOSIS — D631 Anemia in chronic kidney disease: Secondary | ICD-10-CM | POA: Diagnosis not present

## 2016-05-12 DIAGNOSIS — N2581 Secondary hyperparathyroidism of renal origin: Secondary | ICD-10-CM | POA: Diagnosis not present

## 2016-05-13 DIAGNOSIS — J45901 Unspecified asthma with (acute) exacerbation: Secondary | ICD-10-CM | POA: Diagnosis not present

## 2016-05-13 DIAGNOSIS — N186 End stage renal disease: Secondary | ICD-10-CM | POA: Diagnosis not present

## 2016-05-13 DIAGNOSIS — R05 Cough: Secondary | ICD-10-CM | POA: Diagnosis not present

## 2016-05-13 DIAGNOSIS — E039 Hypothyroidism, unspecified: Secondary | ICD-10-CM | POA: Diagnosis not present

## 2016-05-13 DIAGNOSIS — J4541 Moderate persistent asthma with (acute) exacerbation: Secondary | ICD-10-CM | POA: Diagnosis not present

## 2016-05-13 DIAGNOSIS — Z6841 Body Mass Index (BMI) 40.0 and over, adult: Secondary | ICD-10-CM | POA: Diagnosis not present

## 2016-05-13 DIAGNOSIS — Z992 Dependence on renal dialysis: Secondary | ICD-10-CM | POA: Diagnosis not present

## 2016-05-13 DIAGNOSIS — Z720 Tobacco use: Secondary | ICD-10-CM | POA: Diagnosis not present

## 2016-05-13 DIAGNOSIS — I120 Hypertensive chronic kidney disease with stage 5 chronic kidney disease or end stage renal disease: Secondary | ICD-10-CM | POA: Diagnosis not present

## 2016-05-14 DIAGNOSIS — D631 Anemia in chronic kidney disease: Secondary | ICD-10-CM | POA: Diagnosis present

## 2016-05-14 DIAGNOSIS — Z9889 Other specified postprocedural states: Secondary | ICD-10-CM | POA: Diagnosis not present

## 2016-05-14 DIAGNOSIS — E6609 Other obesity due to excess calories: Secondary | ICD-10-CM | POA: Diagnosis present

## 2016-05-14 DIAGNOSIS — F419 Anxiety disorder, unspecified: Secondary | ICD-10-CM | POA: Diagnosis present

## 2016-05-14 DIAGNOSIS — J441 Chronic obstructive pulmonary disease with (acute) exacerbation: Secondary | ICD-10-CM | POA: Diagnosis not present

## 2016-05-14 DIAGNOSIS — E039 Hypothyroidism, unspecified: Secondary | ICD-10-CM | POA: Diagnosis present

## 2016-05-14 DIAGNOSIS — E785 Hyperlipidemia, unspecified: Secondary | ICD-10-CM | POA: Diagnosis present

## 2016-05-14 DIAGNOSIS — F17200 Nicotine dependence, unspecified, uncomplicated: Secondary | ICD-10-CM | POA: Diagnosis present

## 2016-05-14 DIAGNOSIS — G4700 Insomnia, unspecified: Secondary | ICD-10-CM | POA: Diagnosis present

## 2016-05-14 DIAGNOSIS — Z6841 Body Mass Index (BMI) 40.0 and over, adult: Secondary | ICD-10-CM | POA: Diagnosis not present

## 2016-05-14 DIAGNOSIS — Z79891 Long term (current) use of opiate analgesic: Secondary | ICD-10-CM | POA: Diagnosis not present

## 2016-05-14 DIAGNOSIS — R0600 Dyspnea, unspecified: Secondary | ICD-10-CM | POA: Diagnosis not present

## 2016-05-14 DIAGNOSIS — Z79899 Other long term (current) drug therapy: Secondary | ICD-10-CM | POA: Diagnosis not present

## 2016-05-14 DIAGNOSIS — N189 Chronic kidney disease, unspecified: Secondary | ICD-10-CM | POA: Diagnosis not present

## 2016-05-14 DIAGNOSIS — N186 End stage renal disease: Secondary | ICD-10-CM | POA: Diagnosis present

## 2016-05-14 DIAGNOSIS — J4541 Moderate persistent asthma with (acute) exacerbation: Secondary | ICD-10-CM | POA: Diagnosis present

## 2016-05-14 DIAGNOSIS — Z992 Dependence on renal dialysis: Secondary | ICD-10-CM | POA: Diagnosis not present

## 2016-05-14 DIAGNOSIS — I120 Hypertensive chronic kidney disease with stage 5 chronic kidney disease or end stage renal disease: Secondary | ICD-10-CM | POA: Diagnosis present

## 2016-05-14 DIAGNOSIS — J45901 Unspecified asthma with (acute) exacerbation: Secondary | ICD-10-CM | POA: Diagnosis not present

## 2016-05-14 DIAGNOSIS — Z881 Allergy status to other antibiotic agents status: Secondary | ICD-10-CM | POA: Diagnosis not present

## 2016-05-14 DIAGNOSIS — M1990 Unspecified osteoarthritis, unspecified site: Secondary | ICD-10-CM | POA: Diagnosis present

## 2016-05-14 DIAGNOSIS — Z7982 Long term (current) use of aspirin: Secondary | ICD-10-CM | POA: Diagnosis not present

## 2016-05-14 DIAGNOSIS — I10 Essential (primary) hypertension: Secondary | ICD-10-CM | POA: Diagnosis not present

## 2016-05-17 DIAGNOSIS — D509 Iron deficiency anemia, unspecified: Secondary | ICD-10-CM | POA: Diagnosis not present

## 2016-05-17 DIAGNOSIS — N186 End stage renal disease: Secondary | ICD-10-CM | POA: Diagnosis not present

## 2016-05-17 DIAGNOSIS — N2581 Secondary hyperparathyroidism of renal origin: Secondary | ICD-10-CM | POA: Diagnosis not present

## 2016-05-17 DIAGNOSIS — Z992 Dependence on renal dialysis: Secondary | ICD-10-CM | POA: Diagnosis not present

## 2016-05-17 DIAGNOSIS — D631 Anemia in chronic kidney disease: Secondary | ICD-10-CM | POA: Diagnosis not present

## 2016-05-19 DIAGNOSIS — D631 Anemia in chronic kidney disease: Secondary | ICD-10-CM | POA: Diagnosis not present

## 2016-05-19 DIAGNOSIS — N186 End stage renal disease: Secondary | ICD-10-CM | POA: Diagnosis not present

## 2016-05-19 DIAGNOSIS — N2581 Secondary hyperparathyroidism of renal origin: Secondary | ICD-10-CM | POA: Diagnosis not present

## 2016-05-19 DIAGNOSIS — D509 Iron deficiency anemia, unspecified: Secondary | ICD-10-CM | POA: Diagnosis not present

## 2016-05-19 DIAGNOSIS — Z992 Dependence on renal dialysis: Secondary | ICD-10-CM | POA: Diagnosis not present

## 2016-05-22 DIAGNOSIS — Z992 Dependence on renal dialysis: Secondary | ICD-10-CM | POA: Diagnosis not present

## 2016-05-22 DIAGNOSIS — D631 Anemia in chronic kidney disease: Secondary | ICD-10-CM | POA: Diagnosis not present

## 2016-05-22 DIAGNOSIS — D509 Iron deficiency anemia, unspecified: Secondary | ICD-10-CM | POA: Diagnosis not present

## 2016-05-22 DIAGNOSIS — N2581 Secondary hyperparathyroidism of renal origin: Secondary | ICD-10-CM | POA: Diagnosis not present

## 2016-05-22 DIAGNOSIS — N186 End stage renal disease: Secondary | ICD-10-CM | POA: Diagnosis not present

## 2016-05-24 DIAGNOSIS — D509 Iron deficiency anemia, unspecified: Secondary | ICD-10-CM | POA: Diagnosis not present

## 2016-05-24 DIAGNOSIS — Z992 Dependence on renal dialysis: Secondary | ICD-10-CM | POA: Diagnosis not present

## 2016-05-24 DIAGNOSIS — N186 End stage renal disease: Secondary | ICD-10-CM | POA: Diagnosis not present

## 2016-05-24 DIAGNOSIS — N2581 Secondary hyperparathyroidism of renal origin: Secondary | ICD-10-CM | POA: Diagnosis not present

## 2016-05-24 DIAGNOSIS — D631 Anemia in chronic kidney disease: Secondary | ICD-10-CM | POA: Diagnosis not present

## 2016-05-25 DIAGNOSIS — J4541 Moderate persistent asthma with (acute) exacerbation: Secondary | ICD-10-CM | POA: Diagnosis not present

## 2016-05-26 DIAGNOSIS — N186 End stage renal disease: Secondary | ICD-10-CM | POA: Diagnosis not present

## 2016-05-26 DIAGNOSIS — D631 Anemia in chronic kidney disease: Secondary | ICD-10-CM | POA: Diagnosis not present

## 2016-05-26 DIAGNOSIS — N2581 Secondary hyperparathyroidism of renal origin: Secondary | ICD-10-CM | POA: Diagnosis not present

## 2016-05-26 DIAGNOSIS — D509 Iron deficiency anemia, unspecified: Secondary | ICD-10-CM | POA: Diagnosis not present

## 2016-05-26 DIAGNOSIS — Z992 Dependence on renal dialysis: Secondary | ICD-10-CM | POA: Diagnosis not present

## 2016-05-29 DIAGNOSIS — N2581 Secondary hyperparathyroidism of renal origin: Secondary | ICD-10-CM | POA: Diagnosis not present

## 2016-05-29 DIAGNOSIS — N186 End stage renal disease: Secondary | ICD-10-CM | POA: Diagnosis not present

## 2016-05-29 DIAGNOSIS — D631 Anemia in chronic kidney disease: Secondary | ICD-10-CM | POA: Diagnosis not present

## 2016-05-29 DIAGNOSIS — D509 Iron deficiency anemia, unspecified: Secondary | ICD-10-CM | POA: Diagnosis not present

## 2016-05-29 DIAGNOSIS — Z992 Dependence on renal dialysis: Secondary | ICD-10-CM | POA: Diagnosis not present

## 2016-05-31 DIAGNOSIS — D509 Iron deficiency anemia, unspecified: Secondary | ICD-10-CM | POA: Diagnosis not present

## 2016-05-31 DIAGNOSIS — N186 End stage renal disease: Secondary | ICD-10-CM | POA: Diagnosis not present

## 2016-05-31 DIAGNOSIS — D631 Anemia in chronic kidney disease: Secondary | ICD-10-CM | POA: Diagnosis not present

## 2016-05-31 DIAGNOSIS — Z992 Dependence on renal dialysis: Secondary | ICD-10-CM | POA: Diagnosis not present

## 2016-05-31 DIAGNOSIS — N2581 Secondary hyperparathyroidism of renal origin: Secondary | ICD-10-CM | POA: Diagnosis not present

## 2016-06-02 DIAGNOSIS — N2581 Secondary hyperparathyroidism of renal origin: Secondary | ICD-10-CM | POA: Diagnosis not present

## 2016-06-02 DIAGNOSIS — N186 End stage renal disease: Secondary | ICD-10-CM | POA: Diagnosis not present

## 2016-06-02 DIAGNOSIS — D631 Anemia in chronic kidney disease: Secondary | ICD-10-CM | POA: Diagnosis not present

## 2016-06-02 DIAGNOSIS — Z992 Dependence on renal dialysis: Secondary | ICD-10-CM | POA: Diagnosis not present

## 2016-06-02 DIAGNOSIS — D509 Iron deficiency anemia, unspecified: Secondary | ICD-10-CM | POA: Diagnosis not present

## 2016-06-05 DIAGNOSIS — D631 Anemia in chronic kidney disease: Secondary | ICD-10-CM | POA: Diagnosis not present

## 2016-06-05 DIAGNOSIS — N2581 Secondary hyperparathyroidism of renal origin: Secondary | ICD-10-CM | POA: Diagnosis not present

## 2016-06-05 DIAGNOSIS — Z992 Dependence on renal dialysis: Secondary | ICD-10-CM | POA: Diagnosis not present

## 2016-06-05 DIAGNOSIS — N186 End stage renal disease: Secondary | ICD-10-CM | POA: Diagnosis not present

## 2016-06-05 DIAGNOSIS — D509 Iron deficiency anemia, unspecified: Secondary | ICD-10-CM | POA: Diagnosis not present

## 2016-06-07 DIAGNOSIS — N2581 Secondary hyperparathyroidism of renal origin: Secondary | ICD-10-CM | POA: Diagnosis not present

## 2016-06-07 DIAGNOSIS — D509 Iron deficiency anemia, unspecified: Secondary | ICD-10-CM | POA: Diagnosis not present

## 2016-06-07 DIAGNOSIS — D631 Anemia in chronic kidney disease: Secondary | ICD-10-CM | POA: Diagnosis not present

## 2016-06-07 DIAGNOSIS — Z992 Dependence on renal dialysis: Secondary | ICD-10-CM | POA: Diagnosis not present

## 2016-06-07 DIAGNOSIS — N186 End stage renal disease: Secondary | ICD-10-CM | POA: Diagnosis not present

## 2016-06-09 DIAGNOSIS — N186 End stage renal disease: Secondary | ICD-10-CM | POA: Diagnosis not present

## 2016-06-09 DIAGNOSIS — Z992 Dependence on renal dialysis: Secondary | ICD-10-CM | POA: Diagnosis not present

## 2016-06-09 DIAGNOSIS — D631 Anemia in chronic kidney disease: Secondary | ICD-10-CM | POA: Diagnosis not present

## 2016-06-09 DIAGNOSIS — N2581 Secondary hyperparathyroidism of renal origin: Secondary | ICD-10-CM | POA: Diagnosis not present

## 2016-06-09 DIAGNOSIS — D509 Iron deficiency anemia, unspecified: Secondary | ICD-10-CM | POA: Diagnosis not present

## 2016-06-12 DIAGNOSIS — N2581 Secondary hyperparathyroidism of renal origin: Secondary | ICD-10-CM | POA: Diagnosis not present

## 2016-06-12 DIAGNOSIS — D631 Anemia in chronic kidney disease: Secondary | ICD-10-CM | POA: Diagnosis not present

## 2016-06-12 DIAGNOSIS — N186 End stage renal disease: Secondary | ICD-10-CM | POA: Diagnosis not present

## 2016-06-12 DIAGNOSIS — D509 Iron deficiency anemia, unspecified: Secondary | ICD-10-CM | POA: Diagnosis not present

## 2016-06-12 DIAGNOSIS — Z992 Dependence on renal dialysis: Secondary | ICD-10-CM | POA: Diagnosis not present

## 2016-06-14 DIAGNOSIS — D509 Iron deficiency anemia, unspecified: Secondary | ICD-10-CM | POA: Diagnosis not present

## 2016-06-14 DIAGNOSIS — D631 Anemia in chronic kidney disease: Secondary | ICD-10-CM | POA: Diagnosis not present

## 2016-06-14 DIAGNOSIS — N2581 Secondary hyperparathyroidism of renal origin: Secondary | ICD-10-CM | POA: Diagnosis not present

## 2016-06-14 DIAGNOSIS — Z992 Dependence on renal dialysis: Secondary | ICD-10-CM | POA: Diagnosis not present

## 2016-06-14 DIAGNOSIS — N186 End stage renal disease: Secondary | ICD-10-CM | POA: Diagnosis not present

## 2016-06-19 DIAGNOSIS — N186 End stage renal disease: Secondary | ICD-10-CM | POA: Diagnosis not present

## 2016-06-19 DIAGNOSIS — Z992 Dependence on renal dialysis: Secondary | ICD-10-CM | POA: Diagnosis not present

## 2016-06-19 DIAGNOSIS — D631 Anemia in chronic kidney disease: Secondary | ICD-10-CM | POA: Diagnosis not present

## 2016-06-19 DIAGNOSIS — N2581 Secondary hyperparathyroidism of renal origin: Secondary | ICD-10-CM | POA: Diagnosis not present

## 2016-06-19 DIAGNOSIS — D509 Iron deficiency anemia, unspecified: Secondary | ICD-10-CM | POA: Diagnosis not present

## 2016-06-20 ENCOUNTER — Ambulatory Visit (HOSPITAL_COMMUNITY): Payer: Self-pay | Admitting: Psychiatry

## 2016-06-21 DIAGNOSIS — D631 Anemia in chronic kidney disease: Secondary | ICD-10-CM | POA: Diagnosis not present

## 2016-06-21 DIAGNOSIS — N186 End stage renal disease: Secondary | ICD-10-CM | POA: Diagnosis not present

## 2016-06-21 DIAGNOSIS — N2581 Secondary hyperparathyroidism of renal origin: Secondary | ICD-10-CM | POA: Diagnosis not present

## 2016-06-21 DIAGNOSIS — D509 Iron deficiency anemia, unspecified: Secondary | ICD-10-CM | POA: Diagnosis not present

## 2016-06-21 DIAGNOSIS — Z992 Dependence on renal dialysis: Secondary | ICD-10-CM | POA: Diagnosis not present

## 2016-06-23 DIAGNOSIS — D631 Anemia in chronic kidney disease: Secondary | ICD-10-CM | POA: Diagnosis not present

## 2016-06-23 DIAGNOSIS — Z992 Dependence on renal dialysis: Secondary | ICD-10-CM | POA: Diagnosis not present

## 2016-06-23 DIAGNOSIS — N186 End stage renal disease: Secondary | ICD-10-CM | POA: Diagnosis not present

## 2016-06-23 DIAGNOSIS — D509 Iron deficiency anemia, unspecified: Secondary | ICD-10-CM | POA: Diagnosis not present

## 2016-06-23 DIAGNOSIS — N2581 Secondary hyperparathyroidism of renal origin: Secondary | ICD-10-CM | POA: Diagnosis not present

## 2016-06-25 ENCOUNTER — Telehealth (HOSPITAL_COMMUNITY): Payer: Self-pay | Admitting: *Deleted

## 2016-06-25 NOTE — Telephone Encounter (Signed)
left voice message, provider out of office 07/19/15.

## 2016-06-26 DIAGNOSIS — D509 Iron deficiency anemia, unspecified: Secondary | ICD-10-CM | POA: Diagnosis not present

## 2016-06-26 DIAGNOSIS — N186 End stage renal disease: Secondary | ICD-10-CM | POA: Diagnosis not present

## 2016-06-26 DIAGNOSIS — N2581 Secondary hyperparathyroidism of renal origin: Secondary | ICD-10-CM | POA: Diagnosis not present

## 2016-06-26 DIAGNOSIS — Z992 Dependence on renal dialysis: Secondary | ICD-10-CM | POA: Diagnosis not present

## 2016-06-26 DIAGNOSIS — D631 Anemia in chronic kidney disease: Secondary | ICD-10-CM | POA: Diagnosis not present

## 2016-06-28 ENCOUNTER — Other Ambulatory Visit: Payer: Self-pay | Admitting: Cardiology

## 2016-06-28 DIAGNOSIS — D631 Anemia in chronic kidney disease: Secondary | ICD-10-CM | POA: Diagnosis not present

## 2016-06-28 DIAGNOSIS — Z992 Dependence on renal dialysis: Secondary | ICD-10-CM | POA: Diagnosis not present

## 2016-06-28 DIAGNOSIS — D509 Iron deficiency anemia, unspecified: Secondary | ICD-10-CM | POA: Diagnosis not present

## 2016-06-28 DIAGNOSIS — N2581 Secondary hyperparathyroidism of renal origin: Secondary | ICD-10-CM | POA: Diagnosis not present

## 2016-06-28 DIAGNOSIS — N186 End stage renal disease: Secondary | ICD-10-CM | POA: Diagnosis not present

## 2016-06-28 MED ORDER — NITROGLYCERIN 0.4 MG SL SUBL: SUBLINGUAL_TABLET | SUBLINGUAL | 3 refills | Status: DC

## 2016-06-28 NOTE — Telephone Encounter (Signed)
° °  1. Which medications need to be refilled? (please list name of each medication and dose if known)  nitroGLYCERIN (NITROSTAT) 0.4 MG SL tablet [[id number]]   2. Which pharmacy/location (including street and city if local pharmacy) is medication to be sent to? [facility] in [place]   3. Do they need a 30 day or 90 day supply?

## 2016-06-28 NOTE — Telephone Encounter (Signed)
Refilled at [facility]

## 2016-06-30 DIAGNOSIS — D509 Iron deficiency anemia, unspecified: Secondary | ICD-10-CM | POA: Diagnosis not present

## 2016-06-30 DIAGNOSIS — D631 Anemia in chronic kidney disease: Secondary | ICD-10-CM | POA: Diagnosis not present

## 2016-06-30 DIAGNOSIS — N2581 Secondary hyperparathyroidism of renal origin: Secondary | ICD-10-CM | POA: Diagnosis not present

## 2016-06-30 DIAGNOSIS — Z992 Dependence on renal dialysis: Secondary | ICD-10-CM | POA: Diagnosis not present

## 2016-06-30 DIAGNOSIS — N186 End stage renal disease: Secondary | ICD-10-CM | POA: Diagnosis not present

## 2016-07-03 DIAGNOSIS — D631 Anemia in chronic kidney disease: Secondary | ICD-10-CM | POA: Diagnosis not present

## 2016-07-03 DIAGNOSIS — Z992 Dependence on renal dialysis: Secondary | ICD-10-CM | POA: Diagnosis not present

## 2016-07-03 DIAGNOSIS — D509 Iron deficiency anemia, unspecified: Secondary | ICD-10-CM | POA: Diagnosis not present

## 2016-07-03 DIAGNOSIS — N2581 Secondary hyperparathyroidism of renal origin: Secondary | ICD-10-CM | POA: Diagnosis not present

## 2016-07-03 DIAGNOSIS — N186 End stage renal disease: Secondary | ICD-10-CM | POA: Diagnosis not present

## 2016-07-05 DIAGNOSIS — N186 End stage renal disease: Secondary | ICD-10-CM | POA: Diagnosis not present

## 2016-07-05 DIAGNOSIS — D631 Anemia in chronic kidney disease: Secondary | ICD-10-CM | POA: Diagnosis not present

## 2016-07-05 DIAGNOSIS — N2581 Secondary hyperparathyroidism of renal origin: Secondary | ICD-10-CM | POA: Diagnosis not present

## 2016-07-05 DIAGNOSIS — D509 Iron deficiency anemia, unspecified: Secondary | ICD-10-CM | POA: Diagnosis not present

## 2016-07-05 DIAGNOSIS — Z992 Dependence on renal dialysis: Secondary | ICD-10-CM | POA: Diagnosis not present

## 2016-07-07 DIAGNOSIS — N2581 Secondary hyperparathyroidism of renal origin: Secondary | ICD-10-CM | POA: Diagnosis not present

## 2016-07-07 DIAGNOSIS — D631 Anemia in chronic kidney disease: Secondary | ICD-10-CM | POA: Diagnosis not present

## 2016-07-07 DIAGNOSIS — Z992 Dependence on renal dialysis: Secondary | ICD-10-CM | POA: Diagnosis not present

## 2016-07-07 DIAGNOSIS — D509 Iron deficiency anemia, unspecified: Secondary | ICD-10-CM | POA: Diagnosis not present

## 2016-07-07 DIAGNOSIS — N186 End stage renal disease: Secondary | ICD-10-CM | POA: Diagnosis not present

## 2016-07-08 DIAGNOSIS — Z992 Dependence on renal dialysis: Secondary | ICD-10-CM | POA: Diagnosis not present

## 2016-07-08 DIAGNOSIS — N186 End stage renal disease: Secondary | ICD-10-CM | POA: Diagnosis not present

## 2016-07-10 DIAGNOSIS — Z992 Dependence on renal dialysis: Secondary | ICD-10-CM | POA: Diagnosis not present

## 2016-07-10 DIAGNOSIS — D509 Iron deficiency anemia, unspecified: Secondary | ICD-10-CM | POA: Diagnosis not present

## 2016-07-10 DIAGNOSIS — N186 End stage renal disease: Secondary | ICD-10-CM | POA: Diagnosis not present

## 2016-07-10 DIAGNOSIS — N2581 Secondary hyperparathyroidism of renal origin: Secondary | ICD-10-CM | POA: Diagnosis not present

## 2016-07-10 DIAGNOSIS — D631 Anemia in chronic kidney disease: Secondary | ICD-10-CM | POA: Diagnosis not present

## 2016-07-11 ENCOUNTER — Other Ambulatory Visit (HOSPITAL_COMMUNITY): Payer: Self-pay | Admitting: Psychiatry

## 2016-07-12 DIAGNOSIS — N2581 Secondary hyperparathyroidism of renal origin: Secondary | ICD-10-CM | POA: Diagnosis not present

## 2016-07-12 DIAGNOSIS — N186 End stage renal disease: Secondary | ICD-10-CM | POA: Diagnosis not present

## 2016-07-12 DIAGNOSIS — D631 Anemia in chronic kidney disease: Secondary | ICD-10-CM | POA: Diagnosis not present

## 2016-07-12 DIAGNOSIS — D509 Iron deficiency anemia, unspecified: Secondary | ICD-10-CM | POA: Diagnosis not present

## 2016-07-12 DIAGNOSIS — Z992 Dependence on renal dialysis: Secondary | ICD-10-CM | POA: Diagnosis not present

## 2016-07-14 DIAGNOSIS — D509 Iron deficiency anemia, unspecified: Secondary | ICD-10-CM | POA: Diagnosis not present

## 2016-07-14 DIAGNOSIS — Z992 Dependence on renal dialysis: Secondary | ICD-10-CM | POA: Diagnosis not present

## 2016-07-14 DIAGNOSIS — N186 End stage renal disease: Secondary | ICD-10-CM | POA: Diagnosis not present

## 2016-07-14 DIAGNOSIS — N2581 Secondary hyperparathyroidism of renal origin: Secondary | ICD-10-CM | POA: Diagnosis not present

## 2016-07-14 DIAGNOSIS — D631 Anemia in chronic kidney disease: Secondary | ICD-10-CM | POA: Diagnosis not present

## 2016-07-18 ENCOUNTER — Ambulatory Visit (HOSPITAL_COMMUNITY): Payer: Self-pay | Admitting: Psychiatry

## 2016-07-18 DIAGNOSIS — N186 End stage renal disease: Secondary | ICD-10-CM | POA: Diagnosis not present

## 2016-07-18 DIAGNOSIS — N2581 Secondary hyperparathyroidism of renal origin: Secondary | ICD-10-CM | POA: Diagnosis not present

## 2016-07-18 DIAGNOSIS — Z992 Dependence on renal dialysis: Secondary | ICD-10-CM | POA: Diagnosis not present

## 2016-07-18 DIAGNOSIS — D631 Anemia in chronic kidney disease: Secondary | ICD-10-CM | POA: Diagnosis not present

## 2016-07-18 DIAGNOSIS — D509 Iron deficiency anemia, unspecified: Secondary | ICD-10-CM | POA: Diagnosis not present

## 2016-07-19 DIAGNOSIS — Z992 Dependence on renal dialysis: Secondary | ICD-10-CM | POA: Diagnosis not present

## 2016-07-19 DIAGNOSIS — N186 End stage renal disease: Secondary | ICD-10-CM | POA: Diagnosis not present

## 2016-07-19 DIAGNOSIS — D509 Iron deficiency anemia, unspecified: Secondary | ICD-10-CM | POA: Diagnosis not present

## 2016-07-19 DIAGNOSIS — N2581 Secondary hyperparathyroidism of renal origin: Secondary | ICD-10-CM | POA: Diagnosis not present

## 2016-07-19 DIAGNOSIS — D631 Anemia in chronic kidney disease: Secondary | ICD-10-CM | POA: Diagnosis not present

## 2016-07-20 ENCOUNTER — Ambulatory Visit (INDEPENDENT_AMBULATORY_CARE_PROVIDER_SITE_OTHER): Payer: Medicare Other | Admitting: Psychiatry

## 2016-07-20 ENCOUNTER — Encounter (HOSPITAL_COMMUNITY): Payer: Self-pay | Admitting: Psychiatry

## 2016-07-20 VITALS — BP 162/86 | HR 78 | Ht <= 58 in | Wt 202.0 lb

## 2016-07-20 DIAGNOSIS — Z888 Allergy status to other drugs, medicaments and biological substances status: Secondary | ICD-10-CM | POA: Diagnosis not present

## 2016-07-20 DIAGNOSIS — F411 Generalized anxiety disorder: Secondary | ICD-10-CM | POA: Diagnosis not present

## 2016-07-20 DIAGNOSIS — F331 Major depressive disorder, recurrent, moderate: Secondary | ICD-10-CM

## 2016-07-20 DIAGNOSIS — Z9889 Other specified postprocedural states: Secondary | ICD-10-CM

## 2016-07-20 MED ORDER — CLONAZEPAM 0.5 MG PO TABS: 0.5000 mg | ORAL_TABLET | Freq: Four times a day (QID) | ORAL | 3 refills | Status: DC

## 2016-07-20 MED ORDER — SERTRALINE HCL 100 MG PO TABS: 100.0000 mg | ORAL_TABLET | Freq: Every day | ORAL | 3 refills | Status: DC
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
## 2016-07-21 DIAGNOSIS — D509 Iron deficiency anemia, unspecified: Secondary | ICD-10-CM | POA: Diagnosis not present

## 2016-07-21 DIAGNOSIS — N2581 Secondary hyperparathyroidism of renal origin: Secondary | ICD-10-CM | POA: Diagnosis not present

## 2016-07-21 DIAGNOSIS — D631 Anemia in chronic kidney disease: Secondary | ICD-10-CM | POA: Diagnosis not present

## 2016-07-21 DIAGNOSIS — Z992 Dependence on renal dialysis: Secondary | ICD-10-CM | POA: Diagnosis not present

## 2016-07-21 DIAGNOSIS — N186 End stage renal disease: Secondary | ICD-10-CM | POA: Diagnosis not present

## 2016-07-24 DIAGNOSIS — D631 Anemia in chronic kidney disease: Secondary | ICD-10-CM | POA: Diagnosis not present

## 2016-07-24 DIAGNOSIS — Z992 Dependence on renal dialysis: Secondary | ICD-10-CM | POA: Diagnosis not present

## 2016-07-24 DIAGNOSIS — D509 Iron deficiency anemia, unspecified: Secondary | ICD-10-CM | POA: Diagnosis not present

## 2016-07-24 DIAGNOSIS — N2581 Secondary hyperparathyroidism of renal origin: Secondary | ICD-10-CM | POA: Diagnosis not present

## 2016-07-24 DIAGNOSIS — N186 End stage renal disease: Secondary | ICD-10-CM | POA: Diagnosis not present

## 2016-07-27 DIAGNOSIS — N2581 Secondary hyperparathyroidism of renal origin: Secondary | ICD-10-CM | POA: Diagnosis not present

## 2016-07-27 DIAGNOSIS — N186 End stage renal disease: Secondary | ICD-10-CM | POA: Diagnosis not present

## 2016-07-27 DIAGNOSIS — Z992 Dependence on renal dialysis: Secondary | ICD-10-CM | POA: Diagnosis not present

## 2016-07-27 DIAGNOSIS — D509 Iron deficiency anemia, unspecified: Secondary | ICD-10-CM | POA: Diagnosis not present

## 2016-07-27 DIAGNOSIS — D631 Anemia in chronic kidney disease: Secondary | ICD-10-CM | POA: Diagnosis not present

## 2016-07-28 DIAGNOSIS — N186 End stage renal disease: Secondary | ICD-10-CM | POA: Diagnosis not present

## 2016-07-28 DIAGNOSIS — D509 Iron deficiency anemia, unspecified: Secondary | ICD-10-CM | POA: Diagnosis not present

## 2016-07-28 DIAGNOSIS — Z992 Dependence on renal dialysis: Secondary | ICD-10-CM | POA: Diagnosis not present

## 2016-07-28 DIAGNOSIS — D631 Anemia in chronic kidney disease: Secondary | ICD-10-CM | POA: Diagnosis not present

## 2016-07-28 DIAGNOSIS — N2581 Secondary hyperparathyroidism of renal origin: Secondary | ICD-10-CM | POA: Diagnosis not present

## 2016-07-31 DIAGNOSIS — Z992 Dependence on renal dialysis: Secondary | ICD-10-CM | POA: Diagnosis not present

## 2016-07-31 DIAGNOSIS — N186 End stage renal disease: Secondary | ICD-10-CM | POA: Diagnosis not present

## 2016-07-31 DIAGNOSIS — D509 Iron deficiency anemia, unspecified: Secondary | ICD-10-CM | POA: Diagnosis not present

## 2016-07-31 DIAGNOSIS — N2581 Secondary hyperparathyroidism of renal origin: Secondary | ICD-10-CM | POA: Diagnosis not present

## 2016-07-31 DIAGNOSIS — D631 Anemia in chronic kidney disease: Secondary | ICD-10-CM | POA: Diagnosis not present

## 2016-08-02 DIAGNOSIS — Z992 Dependence on renal dialysis: Secondary | ICD-10-CM | POA: Diagnosis not present

## 2016-08-02 DIAGNOSIS — D631 Anemia in chronic kidney disease: Secondary | ICD-10-CM | POA: Diagnosis not present

## 2016-08-02 DIAGNOSIS — N2581 Secondary hyperparathyroidism of renal origin: Secondary | ICD-10-CM | POA: Diagnosis not present

## 2016-08-02 DIAGNOSIS — N186 End stage renal disease: Secondary | ICD-10-CM | POA: Diagnosis not present

## 2016-08-02 DIAGNOSIS — D509 Iron deficiency anemia, unspecified: Secondary | ICD-10-CM | POA: Diagnosis not present

## 2016-08-04 DIAGNOSIS — D509 Iron deficiency anemia, unspecified: Secondary | ICD-10-CM | POA: Diagnosis not present

## 2016-08-04 DIAGNOSIS — Z992 Dependence on renal dialysis: Secondary | ICD-10-CM | POA: Diagnosis not present

## 2016-08-04 DIAGNOSIS — D631 Anemia in chronic kidney disease: Secondary | ICD-10-CM | POA: Diagnosis not present

## 2016-08-04 DIAGNOSIS — N186 End stage renal disease: Secondary | ICD-10-CM | POA: Diagnosis not present

## 2016-08-04 DIAGNOSIS — N2581 Secondary hyperparathyroidism of renal origin: Secondary | ICD-10-CM | POA: Diagnosis not present

## 2016-08-07 DIAGNOSIS — D631 Anemia in chronic kidney disease: Secondary | ICD-10-CM | POA: Diagnosis not present

## 2016-08-07 DIAGNOSIS — N186 End stage renal disease: Secondary | ICD-10-CM | POA: Diagnosis not present

## 2016-08-07 DIAGNOSIS — N2581 Secondary hyperparathyroidism of renal origin: Secondary | ICD-10-CM | POA: Diagnosis not present

## 2016-08-07 DIAGNOSIS — Z992 Dependence on renal dialysis: Secondary | ICD-10-CM | POA: Diagnosis not present

## 2016-08-07 DIAGNOSIS — D509 Iron deficiency anemia, unspecified: Secondary | ICD-10-CM | POA: Diagnosis not present

## 2016-08-08 DIAGNOSIS — Z992 Dependence on renal dialysis: Secondary | ICD-10-CM | POA: Diagnosis not present

## 2016-08-08 DIAGNOSIS — N186 End stage renal disease: Secondary | ICD-10-CM | POA: Diagnosis not present

## 2016-08-09 DIAGNOSIS — Z992 Dependence on renal dialysis: Secondary | ICD-10-CM | POA: Diagnosis not present

## 2016-08-09 DIAGNOSIS — D509 Iron deficiency anemia, unspecified: Secondary | ICD-10-CM | POA: Diagnosis not present

## 2016-08-09 DIAGNOSIS — N2581 Secondary hyperparathyroidism of renal origin: Secondary | ICD-10-CM | POA: Diagnosis not present

## 2016-08-09 DIAGNOSIS — D631 Anemia in chronic kidney disease: Secondary | ICD-10-CM | POA: Diagnosis not present

## 2016-08-09 DIAGNOSIS — N186 End stage renal disease: Secondary | ICD-10-CM | POA: Diagnosis not present

## 2016-08-11 DIAGNOSIS — N186 End stage renal disease: Secondary | ICD-10-CM | POA: Diagnosis not present

## 2016-08-11 DIAGNOSIS — D631 Anemia in chronic kidney disease: Secondary | ICD-10-CM | POA: Diagnosis not present

## 2016-08-11 DIAGNOSIS — Z992 Dependence on renal dialysis: Secondary | ICD-10-CM | POA: Diagnosis not present

## 2016-08-11 DIAGNOSIS — D509 Iron deficiency anemia, unspecified: Secondary | ICD-10-CM | POA: Diagnosis not present

## 2016-08-11 DIAGNOSIS — N2581 Secondary hyperparathyroidism of renal origin: Secondary | ICD-10-CM | POA: Diagnosis not present

## 2016-08-14 DIAGNOSIS — D631 Anemia in chronic kidney disease: Secondary | ICD-10-CM | POA: Diagnosis not present

## 2016-08-14 DIAGNOSIS — D509 Iron deficiency anemia, unspecified: Secondary | ICD-10-CM | POA: Diagnosis not present

## 2016-08-14 DIAGNOSIS — N2581 Secondary hyperparathyroidism of renal origin: Secondary | ICD-10-CM | POA: Diagnosis not present

## 2016-08-14 DIAGNOSIS — N186 End stage renal disease: Secondary | ICD-10-CM | POA: Diagnosis not present

## 2016-08-14 DIAGNOSIS — Z992 Dependence on renal dialysis: Secondary | ICD-10-CM | POA: Diagnosis not present

## 2016-08-16 ENCOUNTER — Other Ambulatory Visit: Payer: Self-pay | Admitting: Cardiology

## 2016-08-16 DIAGNOSIS — N2581 Secondary hyperparathyroidism of renal origin: Secondary | ICD-10-CM | POA: Diagnosis not present

## 2016-08-16 DIAGNOSIS — D631 Anemia in chronic kidney disease: Secondary | ICD-10-CM | POA: Diagnosis not present

## 2016-08-16 DIAGNOSIS — Z992 Dependence on renal dialysis: Secondary | ICD-10-CM | POA: Diagnosis not present

## 2016-08-16 DIAGNOSIS — N186 End stage renal disease: Secondary | ICD-10-CM | POA: Diagnosis not present

## 2016-08-16 DIAGNOSIS — D509 Iron deficiency anemia, unspecified: Secondary | ICD-10-CM | POA: Diagnosis not present

## 2016-08-18 DIAGNOSIS — D509 Iron deficiency anemia, unspecified: Secondary | ICD-10-CM | POA: Diagnosis not present

## 2016-08-18 DIAGNOSIS — Z992 Dependence on renal dialysis: Secondary | ICD-10-CM | POA: Diagnosis not present

## 2016-08-18 DIAGNOSIS — N186 End stage renal disease: Secondary | ICD-10-CM | POA: Diagnosis not present

## 2016-08-18 DIAGNOSIS — D631 Anemia in chronic kidney disease: Secondary | ICD-10-CM | POA: Diagnosis not present

## 2016-08-18 DIAGNOSIS — N2581 Secondary hyperparathyroidism of renal origin: Secondary | ICD-10-CM | POA: Diagnosis not present

## 2016-08-21 DIAGNOSIS — D509 Iron deficiency anemia, unspecified: Secondary | ICD-10-CM | POA: Diagnosis not present

## 2016-08-21 DIAGNOSIS — Z992 Dependence on renal dialysis: Secondary | ICD-10-CM | POA: Diagnosis not present

## 2016-08-21 DIAGNOSIS — D631 Anemia in chronic kidney disease: Secondary | ICD-10-CM | POA: Diagnosis not present

## 2016-08-21 DIAGNOSIS — N2581 Secondary hyperparathyroidism of renal origin: Secondary | ICD-10-CM | POA: Diagnosis not present

## 2016-08-21 DIAGNOSIS — N186 End stage renal disease: Secondary | ICD-10-CM | POA: Diagnosis not present

## 2016-08-23 DIAGNOSIS — Z992 Dependence on renal dialysis: Secondary | ICD-10-CM | POA: Diagnosis not present

## 2016-08-23 DIAGNOSIS — D631 Anemia in chronic kidney disease: Secondary | ICD-10-CM | POA: Diagnosis not present

## 2016-08-23 DIAGNOSIS — D509 Iron deficiency anemia, unspecified: Secondary | ICD-10-CM | POA: Diagnosis not present

## 2016-08-23 DIAGNOSIS — N2581 Secondary hyperparathyroidism of renal origin: Secondary | ICD-10-CM | POA: Diagnosis not present

## 2016-08-23 DIAGNOSIS — N186 End stage renal disease: Secondary | ICD-10-CM | POA: Diagnosis not present

## 2016-08-25 DIAGNOSIS — N186 End stage renal disease: Secondary | ICD-10-CM | POA: Diagnosis not present

## 2016-08-25 DIAGNOSIS — Z992 Dependence on renal dialysis: Secondary | ICD-10-CM | POA: Diagnosis not present

## 2016-08-25 DIAGNOSIS — D509 Iron deficiency anemia, unspecified: Secondary | ICD-10-CM | POA: Diagnosis not present

## 2016-08-25 DIAGNOSIS — D631 Anemia in chronic kidney disease: Secondary | ICD-10-CM | POA: Diagnosis not present

## 2016-08-25 DIAGNOSIS — N2581 Secondary hyperparathyroidism of renal origin: Secondary | ICD-10-CM | POA: Diagnosis not present

## 2016-08-28 DIAGNOSIS — N186 End stage renal disease: Secondary | ICD-10-CM | POA: Diagnosis not present

## 2016-08-28 DIAGNOSIS — Z992 Dependence on renal dialysis: Secondary | ICD-10-CM | POA: Diagnosis not present

## 2016-08-28 DIAGNOSIS — D509 Iron deficiency anemia, unspecified: Secondary | ICD-10-CM | POA: Diagnosis not present

## 2016-08-28 DIAGNOSIS — D631 Anemia in chronic kidney disease: Secondary | ICD-10-CM | POA: Diagnosis not present

## 2016-08-28 DIAGNOSIS — N2581 Secondary hyperparathyroidism of renal origin: Secondary | ICD-10-CM | POA: Diagnosis not present

## 2016-08-30 DIAGNOSIS — Z992 Dependence on renal dialysis: Secondary | ICD-10-CM | POA: Diagnosis not present

## 2016-08-30 DIAGNOSIS — N2581 Secondary hyperparathyroidism of renal origin: Secondary | ICD-10-CM | POA: Diagnosis not present

## 2016-08-30 DIAGNOSIS — D631 Anemia in chronic kidney disease: Secondary | ICD-10-CM | POA: Diagnosis not present

## 2016-08-30 DIAGNOSIS — N186 End stage renal disease: Secondary | ICD-10-CM | POA: Diagnosis not present

## 2016-08-30 DIAGNOSIS — D509 Iron deficiency anemia, unspecified: Secondary | ICD-10-CM | POA: Diagnosis not present

## 2016-09-01 DIAGNOSIS — N2581 Secondary hyperparathyroidism of renal origin: Secondary | ICD-10-CM | POA: Diagnosis not present

## 2016-09-01 DIAGNOSIS — N186 End stage renal disease: Secondary | ICD-10-CM | POA: Diagnosis not present

## 2016-09-01 DIAGNOSIS — D509 Iron deficiency anemia, unspecified: Secondary | ICD-10-CM | POA: Diagnosis not present

## 2016-09-01 DIAGNOSIS — D631 Anemia in chronic kidney disease: Secondary | ICD-10-CM | POA: Diagnosis not present

## 2016-09-01 DIAGNOSIS — Z992 Dependence on renal dialysis: Secondary | ICD-10-CM | POA: Diagnosis not present

## 2016-09-03 ENCOUNTER — Other Ambulatory Visit: Payer: Self-pay | Admitting: *Deleted

## 2016-09-03 MED ORDER — NITROGLYCERIN 0.4 MG SL SUBL: 0.4000 mg | SUBLINGUAL_TABLET | SUBLINGUAL | 10 refills | Status: DC | PRN

## 2016-09-04 DIAGNOSIS — N2581 Secondary hyperparathyroidism of renal origin: Secondary | ICD-10-CM | POA: Diagnosis not present

## 2016-09-04 DIAGNOSIS — D631 Anemia in chronic kidney disease: Secondary | ICD-10-CM | POA: Diagnosis not present

## 2016-09-04 DIAGNOSIS — Z992 Dependence on renal dialysis: Secondary | ICD-10-CM | POA: Diagnosis not present

## 2016-09-04 DIAGNOSIS — N186 End stage renal disease: Secondary | ICD-10-CM | POA: Diagnosis not present

## 2016-09-04 DIAGNOSIS — D509 Iron deficiency anemia, unspecified: Secondary | ICD-10-CM | POA: Diagnosis not present

## 2016-09-05 DIAGNOSIS — N186 End stage renal disease: Secondary | ICD-10-CM | POA: Diagnosis not present

## 2016-09-05 DIAGNOSIS — Z992 Dependence on renal dialysis: Secondary | ICD-10-CM | POA: Diagnosis not present

## 2016-09-06 DIAGNOSIS — N2581 Secondary hyperparathyroidism of renal origin: Secondary | ICD-10-CM | POA: Diagnosis not present

## 2016-09-06 DIAGNOSIS — N186 End stage renal disease: Secondary | ICD-10-CM | POA: Diagnosis not present

## 2016-09-06 DIAGNOSIS — Z992 Dependence on renal dialysis: Secondary | ICD-10-CM | POA: Diagnosis not present

## 2016-09-06 DIAGNOSIS — D631 Anemia in chronic kidney disease: Secondary | ICD-10-CM | POA: Diagnosis not present

## 2016-09-06 DIAGNOSIS — D509 Iron deficiency anemia, unspecified: Secondary | ICD-10-CM | POA: Diagnosis not present

## 2016-09-08 DIAGNOSIS — Z992 Dependence on renal dialysis: Secondary | ICD-10-CM | POA: Diagnosis not present

## 2016-09-08 DIAGNOSIS — N2581 Secondary hyperparathyroidism of renal origin: Secondary | ICD-10-CM | POA: Diagnosis not present

## 2016-09-08 DIAGNOSIS — N186 End stage renal disease: Secondary | ICD-10-CM | POA: Diagnosis not present

## 2016-09-08 DIAGNOSIS — D631 Anemia in chronic kidney disease: Secondary | ICD-10-CM | POA: Diagnosis not present

## 2016-09-08 DIAGNOSIS — D509 Iron deficiency anemia, unspecified: Secondary | ICD-10-CM | POA: Diagnosis not present

## 2016-09-11 DIAGNOSIS — D631 Anemia in chronic kidney disease: Secondary | ICD-10-CM | POA: Diagnosis not present

## 2016-09-11 DIAGNOSIS — Z992 Dependence on renal dialysis: Secondary | ICD-10-CM | POA: Diagnosis not present

## 2016-09-11 DIAGNOSIS — D509 Iron deficiency anemia, unspecified: Secondary | ICD-10-CM | POA: Diagnosis not present

## 2016-09-11 DIAGNOSIS — N186 End stage renal disease: Secondary | ICD-10-CM | POA: Diagnosis not present

## 2016-09-11 DIAGNOSIS — N2581 Secondary hyperparathyroidism of renal origin: Secondary | ICD-10-CM | POA: Diagnosis not present

## 2016-09-13 DIAGNOSIS — Z992 Dependence on renal dialysis: Secondary | ICD-10-CM | POA: Diagnosis not present

## 2016-09-13 DIAGNOSIS — N2581 Secondary hyperparathyroidism of renal origin: Secondary | ICD-10-CM | POA: Diagnosis not present

## 2016-09-13 DIAGNOSIS — D631 Anemia in chronic kidney disease: Secondary | ICD-10-CM | POA: Diagnosis not present

## 2016-09-13 DIAGNOSIS — N186 End stage renal disease: Secondary | ICD-10-CM | POA: Diagnosis not present

## 2016-09-13 DIAGNOSIS — D509 Iron deficiency anemia, unspecified: Secondary | ICD-10-CM | POA: Diagnosis not present

## 2016-09-15 DIAGNOSIS — N2581 Secondary hyperparathyroidism of renal origin: Secondary | ICD-10-CM | POA: Diagnosis not present

## 2016-09-15 DIAGNOSIS — Z992 Dependence on renal dialysis: Secondary | ICD-10-CM | POA: Diagnosis not present

## 2016-09-15 DIAGNOSIS — N186 End stage renal disease: Secondary | ICD-10-CM | POA: Diagnosis not present

## 2016-09-15 DIAGNOSIS — D509 Iron deficiency anemia, unspecified: Secondary | ICD-10-CM | POA: Diagnosis not present

## 2016-09-15 DIAGNOSIS — D631 Anemia in chronic kidney disease: Secondary | ICD-10-CM | POA: Diagnosis not present

## 2016-09-18 DIAGNOSIS — N2581 Secondary hyperparathyroidism of renal origin: Secondary | ICD-10-CM | POA: Diagnosis not present

## 2016-09-18 DIAGNOSIS — D509 Iron deficiency anemia, unspecified: Secondary | ICD-10-CM | POA: Diagnosis not present

## 2016-09-18 DIAGNOSIS — N186 End stage renal disease: Secondary | ICD-10-CM | POA: Diagnosis not present

## 2016-09-18 DIAGNOSIS — Z992 Dependence on renal dialysis: Secondary | ICD-10-CM | POA: Diagnosis not present

## 2016-09-18 DIAGNOSIS — D631 Anemia in chronic kidney disease: Secondary | ICD-10-CM | POA: Diagnosis not present

## 2016-09-20 DIAGNOSIS — N2581 Secondary hyperparathyroidism of renal origin: Secondary | ICD-10-CM | POA: Diagnosis not present

## 2016-09-20 DIAGNOSIS — D509 Iron deficiency anemia, unspecified: Secondary | ICD-10-CM | POA: Diagnosis not present

## 2016-09-20 DIAGNOSIS — N186 End stage renal disease: Secondary | ICD-10-CM | POA: Diagnosis not present

## 2016-09-20 DIAGNOSIS — Z992 Dependence on renal dialysis: Secondary | ICD-10-CM | POA: Diagnosis not present

## 2016-09-20 DIAGNOSIS — D631 Anemia in chronic kidney disease: Secondary | ICD-10-CM | POA: Diagnosis not present

## 2016-09-22 DIAGNOSIS — D509 Iron deficiency anemia, unspecified: Secondary | ICD-10-CM | POA: Diagnosis not present

## 2016-09-22 DIAGNOSIS — N2581 Secondary hyperparathyroidism of renal origin: Secondary | ICD-10-CM | POA: Diagnosis not present

## 2016-09-22 DIAGNOSIS — D631 Anemia in chronic kidney disease: Secondary | ICD-10-CM | POA: Diagnosis not present

## 2016-09-22 DIAGNOSIS — Z992 Dependence on renal dialysis: Secondary | ICD-10-CM | POA: Diagnosis not present

## 2016-09-22 DIAGNOSIS — N186 End stage renal disease: Secondary | ICD-10-CM | POA: Diagnosis not present

## 2016-09-25 DIAGNOSIS — D631 Anemia in chronic kidney disease: Secondary | ICD-10-CM | POA: Diagnosis not present

## 2016-09-25 DIAGNOSIS — N186 End stage renal disease: Secondary | ICD-10-CM | POA: Diagnosis not present

## 2016-09-25 DIAGNOSIS — N2581 Secondary hyperparathyroidism of renal origin: Secondary | ICD-10-CM | POA: Diagnosis not present

## 2016-09-25 DIAGNOSIS — D509 Iron deficiency anemia, unspecified: Secondary | ICD-10-CM | POA: Diagnosis not present

## 2016-09-25 DIAGNOSIS — Z992 Dependence on renal dialysis: Secondary | ICD-10-CM | POA: Diagnosis not present

## 2016-09-27 DIAGNOSIS — N186 End stage renal disease: Secondary | ICD-10-CM | POA: Diagnosis not present

## 2016-09-27 DIAGNOSIS — D509 Iron deficiency anemia, unspecified: Secondary | ICD-10-CM | POA: Diagnosis not present

## 2016-09-27 DIAGNOSIS — Z992 Dependence on renal dialysis: Secondary | ICD-10-CM | POA: Diagnosis not present

## 2016-09-27 DIAGNOSIS — N2581 Secondary hyperparathyroidism of renal origin: Secondary | ICD-10-CM | POA: Diagnosis not present

## 2016-09-27 DIAGNOSIS — D631 Anemia in chronic kidney disease: Secondary | ICD-10-CM | POA: Diagnosis not present

## 2016-09-29 DIAGNOSIS — D509 Iron deficiency anemia, unspecified: Secondary | ICD-10-CM | POA: Diagnosis not present

## 2016-09-29 DIAGNOSIS — N186 End stage renal disease: Secondary | ICD-10-CM | POA: Diagnosis not present

## 2016-09-29 DIAGNOSIS — D631 Anemia in chronic kidney disease: Secondary | ICD-10-CM | POA: Diagnosis not present

## 2016-09-29 DIAGNOSIS — N2581 Secondary hyperparathyroidism of renal origin: Secondary | ICD-10-CM | POA: Diagnosis not present

## 2016-09-29 DIAGNOSIS — Z992 Dependence on renal dialysis: Secondary | ICD-10-CM | POA: Diagnosis not present

## 2016-10-01 DIAGNOSIS — E559 Vitamin D deficiency, unspecified: Secondary | ICD-10-CM | POA: Diagnosis not present

## 2016-10-01 DIAGNOSIS — Z1231 Encounter for screening mammogram for malignant neoplasm of breast: Secondary | ICD-10-CM | POA: Diagnosis not present

## 2016-10-01 DIAGNOSIS — E038 Other specified hypothyroidism: Secondary | ICD-10-CM | POA: Diagnosis not present

## 2016-10-01 DIAGNOSIS — Z1211 Encounter for screening for malignant neoplasm of colon: Secondary | ICD-10-CM | POA: Diagnosis not present

## 2016-10-01 DIAGNOSIS — E7801 Familial hypercholesterolemia: Secondary | ICD-10-CM | POA: Diagnosis not present

## 2016-10-01 DIAGNOSIS — R51 Headache: Secondary | ICD-10-CM | POA: Diagnosis not present

## 2016-10-01 DIAGNOSIS — Z6826 Body mass index (BMI) 26.0-26.9, adult: Secondary | ICD-10-CM | POA: Diagnosis not present

## 2016-10-01 DIAGNOSIS — Z0000 Encounter for general adult medical examination without abnormal findings: Secondary | ICD-10-CM | POA: Diagnosis not present

## 2016-10-01 DIAGNOSIS — Z124 Encounter for screening for malignant neoplasm of cervix: Secondary | ICD-10-CM | POA: Diagnosis not present

## 2016-10-01 DIAGNOSIS — I10 Essential (primary) hypertension: Secondary | ICD-10-CM | POA: Diagnosis not present

## 2016-10-02 DIAGNOSIS — D631 Anemia in chronic kidney disease: Secondary | ICD-10-CM | POA: Diagnosis not present

## 2016-10-02 DIAGNOSIS — D509 Iron deficiency anemia, unspecified: Secondary | ICD-10-CM | POA: Diagnosis not present

## 2016-10-02 DIAGNOSIS — N2581 Secondary hyperparathyroidism of renal origin: Secondary | ICD-10-CM | POA: Diagnosis not present

## 2016-10-02 DIAGNOSIS — E785 Hyperlipidemia, unspecified: Secondary | ICD-10-CM | POA: Diagnosis not present

## 2016-10-02 DIAGNOSIS — Z992 Dependence on renal dialysis: Secondary | ICD-10-CM | POA: Diagnosis not present

## 2016-10-02 DIAGNOSIS — N186 End stage renal disease: Secondary | ICD-10-CM | POA: Diagnosis not present

## 2016-10-03 ENCOUNTER — Telehealth: Payer: Self-pay | Admitting: Cardiology

## 2016-10-03 ENCOUNTER — Other Ambulatory Visit: Payer: Self-pay

## 2016-10-03 MED ORDER — NITROGLYCERIN 0.4 MG SL SUBL: 0.4000 mg | SUBLINGUAL_TABLET | SUBLINGUAL | 0 refills | Status: DC | PRN

## 2016-10-03 NOTE — Telephone Encounter (Signed)
Ok to write for nitro pills 75 tablets per month, 3 refills. She is due to see me in June, we will need to talk about this further at that time   Zandra Abts MD

## 2016-10-03 NOTE — Telephone Encounter (Signed)
Please obtain more info. How many nitros a day is she taking? Why is she taking so many?   J BranCH MD

## 2016-10-03 NOTE — Telephone Encounter (Signed)
nitroGLYCERIN (NITROSTAT) 0.4 MG SL tablet   dIVATA RX  Shes asking for new RX to state she needs 75 pills per month not 90 days. She [facility] she [facility] .

## 2016-10-03 NOTE — Telephone Encounter (Signed)
Pt says she has always taken this many since seeing Dr Ron Parker  - says she take "1 to a few a day"

## 2016-10-03 NOTE — Telephone Encounter (Signed)
Pt made aware - 225 tablets with no refills sent to [facility] - scheduled appt for June

## 2016-10-03 NOTE — Telephone Encounter (Signed)
Pt says she needs 75 tablets per month of NTG - we sent in 74 tablets on 09/05/16 which pt says she is about out and needs refills - pharmacy will not fill since 75 tablets should be 90 day supply - routed to Dr Harl Bowie if ok to send 225 tablets as being requested by pt

## 2016-10-04 DIAGNOSIS — N2581 Secondary hyperparathyroidism of renal origin: Secondary | ICD-10-CM | POA: Diagnosis not present

## 2016-10-04 DIAGNOSIS — D631 Anemia in chronic kidney disease: Secondary | ICD-10-CM | POA: Diagnosis not present

## 2016-10-04 DIAGNOSIS — Z992 Dependence on renal dialysis: Secondary | ICD-10-CM | POA: Diagnosis not present

## 2016-10-04 DIAGNOSIS — N186 End stage renal disease: Secondary | ICD-10-CM | POA: Diagnosis not present

## 2016-10-04 DIAGNOSIS — D509 Iron deficiency anemia, unspecified: Secondary | ICD-10-CM | POA: Diagnosis not present

## 2016-10-06 DIAGNOSIS — D509 Iron deficiency anemia, unspecified: Secondary | ICD-10-CM | POA: Diagnosis not present

## 2016-10-06 DIAGNOSIS — N2581 Secondary hyperparathyroidism of renal origin: Secondary | ICD-10-CM | POA: Diagnosis not present

## 2016-10-06 DIAGNOSIS — D631 Anemia in chronic kidney disease: Secondary | ICD-10-CM | POA: Diagnosis not present

## 2016-10-06 DIAGNOSIS — Z992 Dependence on renal dialysis: Secondary | ICD-10-CM | POA: Diagnosis not present

## 2016-10-06 DIAGNOSIS — N186 End stage renal disease: Secondary | ICD-10-CM | POA: Diagnosis not present

## 2016-10-09 DIAGNOSIS — D631 Anemia in chronic kidney disease: Secondary | ICD-10-CM | POA: Diagnosis not present

## 2016-10-09 DIAGNOSIS — N2581 Secondary hyperparathyroidism of renal origin: Secondary | ICD-10-CM | POA: Diagnosis not present

## 2016-10-09 DIAGNOSIS — Z992 Dependence on renal dialysis: Secondary | ICD-10-CM | POA: Diagnosis not present

## 2016-10-09 DIAGNOSIS — N186 End stage renal disease: Secondary | ICD-10-CM | POA: Diagnosis not present

## 2016-10-09 DIAGNOSIS — D509 Iron deficiency anemia, unspecified: Secondary | ICD-10-CM | POA: Diagnosis not present

## 2016-10-11 DIAGNOSIS — D631 Anemia in chronic kidney disease: Secondary | ICD-10-CM | POA: Diagnosis not present

## 2016-10-11 DIAGNOSIS — N2581 Secondary hyperparathyroidism of renal origin: Secondary | ICD-10-CM | POA: Diagnosis not present

## 2016-10-11 DIAGNOSIS — Z992 Dependence on renal dialysis: Secondary | ICD-10-CM | POA: Diagnosis not present

## 2016-10-11 DIAGNOSIS — D509 Iron deficiency anemia, unspecified: Secondary | ICD-10-CM | POA: Diagnosis not present

## 2016-10-11 DIAGNOSIS — N186 End stage renal disease: Secondary | ICD-10-CM | POA: Diagnosis not present

## 2016-10-13 DIAGNOSIS — D631 Anemia in chronic kidney disease: Secondary | ICD-10-CM | POA: Diagnosis not present

## 2016-10-13 DIAGNOSIS — D509 Iron deficiency anemia, unspecified: Secondary | ICD-10-CM | POA: Diagnosis not present

## 2016-10-13 DIAGNOSIS — N2581 Secondary hyperparathyroidism of renal origin: Secondary | ICD-10-CM | POA: Diagnosis not present

## 2016-10-13 DIAGNOSIS — Z992 Dependence on renal dialysis: Secondary | ICD-10-CM | POA: Diagnosis not present

## 2016-10-13 DIAGNOSIS — N186 End stage renal disease: Secondary | ICD-10-CM | POA: Diagnosis not present

## 2016-10-16 DIAGNOSIS — D631 Anemia in chronic kidney disease: Secondary | ICD-10-CM | POA: Diagnosis not present

## 2016-10-16 DIAGNOSIS — N2581 Secondary hyperparathyroidism of renal origin: Secondary | ICD-10-CM | POA: Diagnosis not present

## 2016-10-16 DIAGNOSIS — D509 Iron deficiency anemia, unspecified: Secondary | ICD-10-CM | POA: Diagnosis not present

## 2016-10-16 DIAGNOSIS — Z992 Dependence on renal dialysis: Secondary | ICD-10-CM | POA: Diagnosis not present

## 2016-10-16 DIAGNOSIS — N186 End stage renal disease: Secondary | ICD-10-CM | POA: Diagnosis not present

## 2016-10-17 ENCOUNTER — Ambulatory Visit (INDEPENDENT_AMBULATORY_CARE_PROVIDER_SITE_OTHER): Payer: Medicare Other | Admitting: Psychiatry

## 2016-10-17 ENCOUNTER — Encounter (HOSPITAL_COMMUNITY): Payer: Self-pay | Admitting: Psychiatry

## 2016-10-17 VITALS — BP 183/82 | HR 61 | Ht <= 58 in | Wt 200.2 lb

## 2016-10-17 DIAGNOSIS — Z79899 Other long term (current) drug therapy: Secondary | ICD-10-CM

## 2016-10-17 DIAGNOSIS — F331 Major depressive disorder, recurrent, moderate: Secondary | ICD-10-CM

## 2016-10-17 DIAGNOSIS — F411 Generalized anxiety disorder: Secondary | ICD-10-CM

## 2016-10-17 MED ORDER — SERTRALINE HCL 100 MG PO TABS: 100.0000 mg | ORAL_TABLET | Freq: Every day | ORAL | 3 refills | Status: DC

## 2016-10-17 MED ORDER — ZOLPIDEM TARTRATE 5 MG PO TABS: 5.0000 mg | ORAL_TABLET | Freq: Every evening | ORAL | 2 refills | Status: DC | PRN

## 2016-10-17 MED ORDER — CLONAZEPAM 0.5 MG PO TABS: 0.5000 mg | ORAL_TABLET | Freq: Four times a day (QID) | ORAL | 3 refills | Status: DC
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
## 2016-10-18 DIAGNOSIS — D509 Iron deficiency anemia, unspecified: Secondary | ICD-10-CM | POA: Diagnosis not present

## 2016-10-18 DIAGNOSIS — D631 Anemia in chronic kidney disease: Secondary | ICD-10-CM | POA: Diagnosis not present

## 2016-10-18 DIAGNOSIS — Z992 Dependence on renal dialysis: Secondary | ICD-10-CM | POA: Diagnosis not present

## 2016-10-18 DIAGNOSIS — N186 End stage renal disease: Secondary | ICD-10-CM | POA: Diagnosis not present

## 2016-10-18 DIAGNOSIS — N2581 Secondary hyperparathyroidism of renal origin: Secondary | ICD-10-CM | POA: Diagnosis not present

## 2016-10-20 DIAGNOSIS — N2581 Secondary hyperparathyroidism of renal origin: Secondary | ICD-10-CM | POA: Diagnosis not present

## 2016-10-20 DIAGNOSIS — Z992 Dependence on renal dialysis: Secondary | ICD-10-CM | POA: Diagnosis not present

## 2016-10-20 DIAGNOSIS — D509 Iron deficiency anemia, unspecified: Secondary | ICD-10-CM | POA: Diagnosis not present

## 2016-10-20 DIAGNOSIS — N186 End stage renal disease: Secondary | ICD-10-CM | POA: Diagnosis not present

## 2016-10-20 DIAGNOSIS — D631 Anemia in chronic kidney disease: Secondary | ICD-10-CM | POA: Diagnosis not present

## 2016-10-23 DIAGNOSIS — D509 Iron deficiency anemia, unspecified: Secondary | ICD-10-CM | POA: Diagnosis not present

## 2016-10-23 DIAGNOSIS — N2581 Secondary hyperparathyroidism of renal origin: Secondary | ICD-10-CM | POA: Diagnosis not present

## 2016-10-23 DIAGNOSIS — D631 Anemia in chronic kidney disease: Secondary | ICD-10-CM | POA: Diagnosis not present

## 2016-10-23 DIAGNOSIS — N186 End stage renal disease: Secondary | ICD-10-CM | POA: Diagnosis not present

## 2016-10-23 DIAGNOSIS — Z992 Dependence on renal dialysis: Secondary | ICD-10-CM | POA: Diagnosis not present

## 2016-10-24 ENCOUNTER — Ambulatory Visit (INDEPENDENT_AMBULATORY_CARE_PROVIDER_SITE_OTHER): Payer: Medicare Other | Admitting: Psychiatry

## 2016-10-24 DIAGNOSIS — F411 Generalized anxiety disorder: Secondary | ICD-10-CM | POA: Diagnosis not present

## 2016-10-24 NOTE — Progress Notes (Signed)
Hello

## 2016-10-24 NOTE — Progress Notes (Signed)
Patient:  Sherry Cox   DOB: [birth year]  MR Number: [id number]  Location: [place]:  [address] [place],  [place], [address]  Start: Wednesday 10/24/2016 3:20 PM End: Wednesday 10/24/2016 4:05 PM  Provider/Observer:     Maurice Small, MSW, LCSW          Chief Complaint:      Chief Complaint  Patient presents with  . Anxiety    Reason For Service:     Jerah Esty is a 56 y.o. female who presents with all standing history of symptoms of depression and anxiety. She is a returning patient to this clinician and was last seen in 2015. She is resuming services due to grief and loss issues related to the deaths of 2 siblings in December 2017 and ongoing health issues.  Interventions Strategy:  Supportive  Participation Level:   Active  Participation Quality:  Appropriate      Behavioral Observation:  Fairly Groomed, Alert, and Depressed.   Current Psychosocial Factors: Deaths of two of her siblings in December 2018, her health,   Content of Session:   Reestablished rapport, reviewed symptoms, discussed stressors, facilitated expression of feelings, began to normalize feelings related to grief,   Current Status:   loss of interest in activities, social withdrawal, poor motivation, fatigue, moving slowly, loss of appetite, sleep difficulty, excessive worry, anxiety  Suicidal/Homicidal:    No  Patient Progress:   Poor. Patient last was seen in by this clinician in 2015. She reports being rinsing lots of physical and mental issues. She has been experiencing severe swelling in various parts of her body including her breast and is scheduled for a mammogram in the near future. She also has been experiencing elevated blood pressure. She reports grief and loss issues related to the deaths of her brother and sister one week apart in December 2017. She expresses sadness over both of her siblings deaths but says she is really having difficulty getting over her sister's death  as they were extremely close and patient talked to her daily. She reports stress related to concerns about one of her 2 remaining siblings who has health issues and depends on patient and her other sibling to help manage her affairs. Patient reports she and the other sister are becoming tired and overwhelmed. She expresses frustration her 32 year old niece is not more involved in her mother's (patient's sister) care.  Target Goals:   1.Reestablished rapport, 2. begin healthy grieving process  Last Reviewed:     Goals Addressed Today:    1,2  Plan:   Return in 2 weeks  Impression/Diagnosis:     Diagnosis:  Axis I: Generalized anxiety disorder          Axis II: Deferred  Kamron Vanwyhe, LCSW 10/24/2016

## 2016-10-25 DIAGNOSIS — N186 End stage renal disease: Secondary | ICD-10-CM | POA: Diagnosis not present

## 2016-10-25 DIAGNOSIS — Z992 Dependence on renal dialysis: Secondary | ICD-10-CM | POA: Diagnosis not present

## 2016-10-25 DIAGNOSIS — D509 Iron deficiency anemia, unspecified: Secondary | ICD-10-CM | POA: Diagnosis not present

## 2016-10-25 DIAGNOSIS — N2581 Secondary hyperparathyroidism of renal origin: Secondary | ICD-10-CM | POA: Diagnosis not present

## 2016-10-25 DIAGNOSIS — D631 Anemia in chronic kidney disease: Secondary | ICD-10-CM | POA: Diagnosis not present

## 2016-10-27 DIAGNOSIS — N2581 Secondary hyperparathyroidism of renal origin: Secondary | ICD-10-CM | POA: Diagnosis not present

## 2016-10-27 DIAGNOSIS — D509 Iron deficiency anemia, unspecified: Secondary | ICD-10-CM | POA: Diagnosis not present

## 2016-10-27 DIAGNOSIS — D631 Anemia in chronic kidney disease: Secondary | ICD-10-CM | POA: Diagnosis not present

## 2016-10-27 DIAGNOSIS — N186 End stage renal disease: Secondary | ICD-10-CM | POA: Diagnosis not present

## 2016-10-27 DIAGNOSIS — Z992 Dependence on renal dialysis: Secondary | ICD-10-CM | POA: Diagnosis not present

## 2016-10-30 DIAGNOSIS — D509 Iron deficiency anemia, unspecified: Secondary | ICD-10-CM | POA: Diagnosis not present

## 2016-10-30 DIAGNOSIS — D631 Anemia in chronic kidney disease: Secondary | ICD-10-CM | POA: Diagnosis not present

## 2016-10-30 DIAGNOSIS — Z992 Dependence on renal dialysis: Secondary | ICD-10-CM | POA: Diagnosis not present

## 2016-10-30 DIAGNOSIS — N186 End stage renal disease: Secondary | ICD-10-CM | POA: Diagnosis not present

## 2016-10-30 DIAGNOSIS — N2581 Secondary hyperparathyroidism of renal origin: Secondary | ICD-10-CM | POA: Diagnosis not present

## 2016-10-31 DIAGNOSIS — I878 Other specified disorders of veins: Secondary | ICD-10-CM | POA: Diagnosis not present

## 2016-10-31 DIAGNOSIS — R928 Other abnormal and inconclusive findings on diagnostic imaging of breast: Secondary | ICD-10-CM | POA: Diagnosis not present

## 2016-10-31 DIAGNOSIS — N6489 Other specified disorders of breast: Secondary | ICD-10-CM | POA: Diagnosis not present

## 2016-10-31 DIAGNOSIS — Z992 Dependence on renal dialysis: Secondary | ICD-10-CM | POA: Diagnosis not present

## 2016-10-31 DIAGNOSIS — R609 Edema, unspecified: Secondary | ICD-10-CM | POA: Diagnosis not present

## 2016-11-01 DIAGNOSIS — Z992 Dependence on renal dialysis: Secondary | ICD-10-CM | POA: Diagnosis not present

## 2016-11-01 DIAGNOSIS — D631 Anemia in chronic kidney disease: Secondary | ICD-10-CM | POA: Diagnosis not present

## 2016-11-01 DIAGNOSIS — N186 End stage renal disease: Secondary | ICD-10-CM | POA: Diagnosis not present

## 2016-11-01 DIAGNOSIS — N2581 Secondary hyperparathyroidism of renal origin: Secondary | ICD-10-CM | POA: Diagnosis not present

## 2016-11-01 DIAGNOSIS — D509 Iron deficiency anemia, unspecified: Secondary | ICD-10-CM | POA: Diagnosis not present

## 2016-11-03 DIAGNOSIS — D631 Anemia in chronic kidney disease: Secondary | ICD-10-CM | POA: Diagnosis not present

## 2016-11-03 DIAGNOSIS — N2581 Secondary hyperparathyroidism of renal origin: Secondary | ICD-10-CM | POA: Diagnosis not present

## 2016-11-03 DIAGNOSIS — Z992 Dependence on renal dialysis: Secondary | ICD-10-CM | POA: Diagnosis not present

## 2016-11-03 DIAGNOSIS — N186 End stage renal disease: Secondary | ICD-10-CM | POA: Diagnosis not present

## 2016-11-03 DIAGNOSIS — D509 Iron deficiency anemia, unspecified: Secondary | ICD-10-CM | POA: Diagnosis not present

## 2016-11-05 DIAGNOSIS — N186 End stage renal disease: Secondary | ICD-10-CM | POA: Diagnosis not present

## 2016-11-05 DIAGNOSIS — Z992 Dependence on renal dialysis: Secondary | ICD-10-CM | POA: Diagnosis not present

## 2016-11-06 DIAGNOSIS — D509 Iron deficiency anemia, unspecified: Secondary | ICD-10-CM | POA: Diagnosis not present

## 2016-11-06 DIAGNOSIS — N2581 Secondary hyperparathyroidism of renal origin: Secondary | ICD-10-CM | POA: Diagnosis not present

## 2016-11-06 DIAGNOSIS — D631 Anemia in chronic kidney disease: Secondary | ICD-10-CM | POA: Diagnosis not present

## 2016-11-06 DIAGNOSIS — Z992 Dependence on renal dialysis: Secondary | ICD-10-CM | POA: Diagnosis not present

## 2016-11-06 DIAGNOSIS — N186 End stage renal disease: Secondary | ICD-10-CM | POA: Diagnosis not present

## 2016-11-08 DIAGNOSIS — N186 End stage renal disease: Secondary | ICD-10-CM | POA: Diagnosis not present

## 2016-11-08 DIAGNOSIS — D509 Iron deficiency anemia, unspecified: Secondary | ICD-10-CM | POA: Diagnosis not present

## 2016-11-08 DIAGNOSIS — Z992 Dependence on renal dialysis: Secondary | ICD-10-CM | POA: Diagnosis not present

## 2016-11-08 DIAGNOSIS — N2581 Secondary hyperparathyroidism of renal origin: Secondary | ICD-10-CM | POA: Diagnosis not present

## 2016-11-08 DIAGNOSIS — D631 Anemia in chronic kidney disease: Secondary | ICD-10-CM | POA: Diagnosis not present

## 2016-11-10 DIAGNOSIS — N2581 Secondary hyperparathyroidism of renal origin: Secondary | ICD-10-CM | POA: Diagnosis not present

## 2016-11-10 DIAGNOSIS — Z992 Dependence on renal dialysis: Secondary | ICD-10-CM | POA: Diagnosis not present

## 2016-11-10 DIAGNOSIS — N186 End stage renal disease: Secondary | ICD-10-CM | POA: Diagnosis not present

## 2016-11-10 DIAGNOSIS — D631 Anemia in chronic kidney disease: Secondary | ICD-10-CM | POA: Diagnosis not present

## 2016-11-10 DIAGNOSIS — D509 Iron deficiency anemia, unspecified: Secondary | ICD-10-CM | POA: Diagnosis not present

## 2016-11-12 ENCOUNTER — Encounter (HOSPITAL_COMMUNITY): Payer: Self-pay | Admitting: Psychiatry

## 2016-11-12 ENCOUNTER — Ambulatory Visit (INDEPENDENT_AMBULATORY_CARE_PROVIDER_SITE_OTHER): Payer: Medicare Other | Admitting: Psychiatry

## 2016-11-12 DIAGNOSIS — F411 Generalized anxiety disorder: Secondary | ICD-10-CM

## 2016-11-12 DIAGNOSIS — F331 Major depressive disorder, recurrent, moderate: Secondary | ICD-10-CM | POA: Diagnosis not present

## 2016-11-12 NOTE — Progress Notes (Signed)
Patient:  Sherry Cox   DOB: 1960-09-01  MR Number: [id number]  Location: [place]:  [address] [place],  [place], [address]  Start: Monday 11/12/2016 9:20 AM End: Monday 11/12/2016 10:15 AM  Provider/Observer:     Maurice Small, MSW, LCSW          Chief Complaint:      Chief Complaint  Patient presents with  . Anxiety    Reason For Service:     [name] is a 56 y.o. female who presents with all standing history of symptoms of depression and anxiety. She is a returning patient to this clinician and was last seen in 2015. She is resuming services due to grief and loss issues related to the deaths of 2 siblings in December 2017 and ongoing health issues.  Interventions Strategy:  Supportive/CBT  Participation Level:   Active  Participation Quality:  Appropriate      Behavioral Observation:  Fairly Groomed, Alert, and Depressed.   Current Psychosocial Factors: Deaths of two of her siblings in December 2018, her health,   Content of Session:    reviewed symptoms, administered PHQ-9, discussed stressors, facilitated expression of feelings, discussed ways to improve self-care, discussed strengths and supports, developed treatment plan  Current Status:   loss of interest in activities, social withdrawal, poor motivation, fatigue, moving slowly, loss of appetite, sleep difficulty, excessive worry, anxiety  Suicidal/Homicidal:    No  Patient Progress:   Poor. Patient last was seen 2 weeks ago. She reports little to no change in symptoms since last session. She is less worried about her health as recent mammogram ruled out cancer but patient still has further testing regarding a culture. She continues to experience grief and loss issues. She also continues to worry about several of her family members  Target Goals:   1.Learn and implement behavioral strategies to overcome depression.    2. Identify stages of grief patient has experienced in the continuum of the  grieving process.    3. Acknowledge dependency on lost loved one and began to refocus life on independent actions to meet emotional needs.  Last Reviewed:   11/12/2016  Goals Addressed Today:    1,2,3  Plan:   Return in 2 weeks  Impression/Diagnosis:     Diagnosis:  Axis I:  Generalized Anxiety Disorder,           Axis II: Deferred  Candela Krul, LCSW 11/12/2016

## 2016-11-13 DIAGNOSIS — N2581 Secondary hyperparathyroidism of renal origin: Secondary | ICD-10-CM | POA: Diagnosis not present

## 2016-11-13 DIAGNOSIS — D509 Iron deficiency anemia, unspecified: Secondary | ICD-10-CM | POA: Diagnosis not present

## 2016-11-13 DIAGNOSIS — Z992 Dependence on renal dialysis: Secondary | ICD-10-CM | POA: Diagnosis not present

## 2016-11-13 DIAGNOSIS — N186 End stage renal disease: Secondary | ICD-10-CM | POA: Diagnosis not present

## 2016-11-13 DIAGNOSIS — D631 Anemia in chronic kidney disease: Secondary | ICD-10-CM | POA: Diagnosis not present

## 2016-11-14 DIAGNOSIS — N61 Mastitis without abscess: Secondary | ICD-10-CM | POA: Diagnosis not present

## 2016-11-15 DIAGNOSIS — D509 Iron deficiency anemia, unspecified: Secondary | ICD-10-CM | POA: Diagnosis not present

## 2016-11-15 DIAGNOSIS — N2581 Secondary hyperparathyroidism of renal origin: Secondary | ICD-10-CM | POA: Diagnosis not present

## 2016-11-15 DIAGNOSIS — Z992 Dependence on renal dialysis: Secondary | ICD-10-CM | POA: Diagnosis not present

## 2016-11-15 DIAGNOSIS — D631 Anemia in chronic kidney disease: Secondary | ICD-10-CM | POA: Diagnosis not present

## 2016-11-15 DIAGNOSIS — N186 End stage renal disease: Secondary | ICD-10-CM | POA: Diagnosis not present

## 2016-11-17 DIAGNOSIS — D509 Iron deficiency anemia, unspecified: Secondary | ICD-10-CM | POA: Diagnosis not present

## 2016-11-17 DIAGNOSIS — Z992 Dependence on renal dialysis: Secondary | ICD-10-CM | POA: Diagnosis not present

## 2016-11-17 DIAGNOSIS — N186 End stage renal disease: Secondary | ICD-10-CM | POA: Diagnosis not present

## 2016-11-17 DIAGNOSIS — D631 Anemia in chronic kidney disease: Secondary | ICD-10-CM | POA: Diagnosis not present

## 2016-11-17 DIAGNOSIS — N2581 Secondary hyperparathyroidism of renal origin: Secondary | ICD-10-CM | POA: Diagnosis not present

## 2016-11-20 DIAGNOSIS — N186 End stage renal disease: Secondary | ICD-10-CM | POA: Diagnosis not present

## 2016-11-20 DIAGNOSIS — D509 Iron deficiency anemia, unspecified: Secondary | ICD-10-CM | POA: Diagnosis not present

## 2016-11-20 DIAGNOSIS — D631 Anemia in chronic kidney disease: Secondary | ICD-10-CM | POA: Diagnosis not present

## 2016-11-20 DIAGNOSIS — N2581 Secondary hyperparathyroidism of renal origin: Secondary | ICD-10-CM | POA: Diagnosis not present

## 2016-11-20 DIAGNOSIS — Z992 Dependence on renal dialysis: Secondary | ICD-10-CM | POA: Diagnosis not present

## 2016-11-22 ENCOUNTER — Other Ambulatory Visit: Payer: Self-pay | Admitting: Cardiology

## 2016-11-22 DIAGNOSIS — N2581 Secondary hyperparathyroidism of renal origin: Secondary | ICD-10-CM | POA: Diagnosis not present

## 2016-11-22 DIAGNOSIS — D509 Iron deficiency anemia, unspecified: Secondary | ICD-10-CM | POA: Diagnosis not present

## 2016-11-22 DIAGNOSIS — Z992 Dependence on renal dialysis: Secondary | ICD-10-CM | POA: Diagnosis not present

## 2016-11-22 DIAGNOSIS — N186 End stage renal disease: Secondary | ICD-10-CM | POA: Diagnosis not present

## 2016-11-22 DIAGNOSIS — D631 Anemia in chronic kidney disease: Secondary | ICD-10-CM | POA: Diagnosis not present

## 2016-11-24 DIAGNOSIS — N2581 Secondary hyperparathyroidism of renal origin: Secondary | ICD-10-CM | POA: Diagnosis not present

## 2016-11-24 DIAGNOSIS — Z992 Dependence on renal dialysis: Secondary | ICD-10-CM | POA: Diagnosis not present

## 2016-11-24 DIAGNOSIS — D631 Anemia in chronic kidney disease: Secondary | ICD-10-CM | POA: Diagnosis not present

## 2016-11-24 DIAGNOSIS — N186 End stage renal disease: Secondary | ICD-10-CM | POA: Diagnosis not present

## 2016-11-24 DIAGNOSIS — D509 Iron deficiency anemia, unspecified: Secondary | ICD-10-CM | POA: Diagnosis not present

## 2016-11-27 DIAGNOSIS — N2581 Secondary hyperparathyroidism of renal origin: Secondary | ICD-10-CM | POA: Diagnosis not present

## 2016-11-27 DIAGNOSIS — Z992 Dependence on renal dialysis: Secondary | ICD-10-CM | POA: Diagnosis not present

## 2016-11-27 DIAGNOSIS — D631 Anemia in chronic kidney disease: Secondary | ICD-10-CM | POA: Diagnosis not present

## 2016-11-27 DIAGNOSIS — D509 Iron deficiency anemia, unspecified: Secondary | ICD-10-CM | POA: Diagnosis not present

## 2016-11-27 DIAGNOSIS — N186 End stage renal disease: Secondary | ICD-10-CM | POA: Diagnosis not present

## 2016-11-28 ENCOUNTER — Ambulatory Visit (INDEPENDENT_AMBULATORY_CARE_PROVIDER_SITE_OTHER): Payer: Medicare Other | Admitting: Psychiatry

## 2016-11-28 ENCOUNTER — Encounter (HOSPITAL_COMMUNITY): Payer: Self-pay | Admitting: Psychiatry

## 2016-11-28 DIAGNOSIS — F411 Generalized anxiety disorder: Secondary | ICD-10-CM | POA: Diagnosis not present

## 2016-11-28 DIAGNOSIS — F331 Major depressive disorder, recurrent, moderate: Secondary | ICD-10-CM | POA: Diagnosis not present

## 2016-11-28 NOTE — Progress Notes (Signed)
Patient:  Sherry Cox   DOB: 01/13/1961  MR Number: [id number]  Location: [place]:  [address] [place],  [place], [address]  Start: Wednesday 11/28/2016 9:03 AM  End: Wednesday 11/28/2016 10:00 AM      Provider/Observer:     Maurice Small, MSW, LCSW          Chief Complaint:      Chief Complaint  Patient presents with  . Anxiety    Reason For Service:     [name] is a 56 y.o. female who presents with all standing history of symptoms of depression and anxiety. She is a returning patient to this clinician and was last seen in 2015. She is resuming services due to grief and loss issues related to the deaths of 2 siblings in 07-15-2016 and ongoing health issues.  Interventions Strategy:  Supportive/CBT  Participation Level:   Active  Participation Quality:  Appropriate      Behavioral Observation:  Fairly Groomed, Alert, and Depressed.   Current Psychosocial Factors: Deaths of two of her siblings in July 15, 2017, her health, recent death of her PCP  Content of Session:    reviewed symptoms, praised and reinforced patient's increased interest in activities and increased social involvement, assisted patient identify effects on mood, assisted patient identify ways to maintain increased activity and social involvement with the use of daily planning, assigned patient to complete forms and bring to next session, facilitated patient sharing the narrative of her sister's death, facilitated expression of feelings,  discussed the connection between sister's death and increased memories of previous losses, provided patient with handout on grief and bereavement, assigned patient to read for next session,   Current Status:   increased interest in activities, increased social involvementl, increased motivation,continued  fatigue, moving slowly, loss of appetite, sleep difficulty, excessive worry, anxiety  Suicidal/Homicidal:    No  Patient Progress:   Fair. Patient  last was seen 2 weeks ago. She reports having a little more energy since PCP changed her thyroid medication. She has been trying to do a few small tasks at home. She also reports going out with family to a dance club to listen to music and recently enjoying a cookout with family. She continues to miss sister but says she is beginning to face sister's death. She reports going to dance clubs was something she and sister used to enjoy doing together. They were very close as they were only 12 months apart in age.  She has had increased thoughts about other losses and potential losses.    Target Goals: 1.Learn and implement behavioral strategies to overcome depression.    2. Identify stages of grief patient has experienced in the continuum of the grieving process.    3. Acknowledge dependency on lost loved one and began to refocus life on independent actions to meet emotional needs.  Last Reviewed:   11/12/2016  Goals Addressed Today:    1,2,3  Plan:   Return in 2 weeks  Impression/Diagnosis:     Diagnosis:  Axis I:  Generalized Anxiety Disorder,           Axis II: Deferred  [name], LCSW 11/28/2016

## 2016-11-29 ENCOUNTER — Other Ambulatory Visit: Payer: Self-pay | Admitting: Cardiology

## 2016-11-29 DIAGNOSIS — Z992 Dependence on renal dialysis: Secondary | ICD-10-CM | POA: Diagnosis not present

## 2016-11-29 DIAGNOSIS — N2581 Secondary hyperparathyroidism of renal origin: Secondary | ICD-10-CM | POA: Diagnosis not present

## 2016-11-29 DIAGNOSIS — N186 End stage renal disease: Secondary | ICD-10-CM | POA: Diagnosis not present

## 2016-11-29 DIAGNOSIS — D509 Iron deficiency anemia, unspecified: Secondary | ICD-10-CM | POA: Diagnosis not present

## 2016-11-29 DIAGNOSIS — D631 Anemia in chronic kidney disease: Secondary | ICD-10-CM | POA: Diagnosis not present

## 2016-12-01 DIAGNOSIS — N2581 Secondary hyperparathyroidism of renal origin: Secondary | ICD-10-CM | POA: Diagnosis not present

## 2016-12-01 DIAGNOSIS — Z992 Dependence on renal dialysis: Secondary | ICD-10-CM | POA: Diagnosis not present

## 2016-12-01 DIAGNOSIS — N186 End stage renal disease: Secondary | ICD-10-CM | POA: Diagnosis not present

## 2016-12-01 DIAGNOSIS — D509 Iron deficiency anemia, unspecified: Secondary | ICD-10-CM | POA: Diagnosis not present

## 2016-12-01 DIAGNOSIS — D631 Anemia in chronic kidney disease: Secondary | ICD-10-CM | POA: Diagnosis not present

## 2016-12-04 DIAGNOSIS — Z992 Dependence on renal dialysis: Secondary | ICD-10-CM | POA: Diagnosis not present

## 2016-12-04 DIAGNOSIS — N186 End stage renal disease: Secondary | ICD-10-CM | POA: Diagnosis not present

## 2016-12-04 DIAGNOSIS — D631 Anemia in chronic kidney disease: Secondary | ICD-10-CM | POA: Diagnosis not present

## 2016-12-04 DIAGNOSIS — N2581 Secondary hyperparathyroidism of renal origin: Secondary | ICD-10-CM | POA: Diagnosis not present

## 2016-12-04 DIAGNOSIS — D509 Iron deficiency anemia, unspecified: Secondary | ICD-10-CM | POA: Diagnosis not present

## 2016-12-06 DIAGNOSIS — D509 Iron deficiency anemia, unspecified: Secondary | ICD-10-CM | POA: Diagnosis not present

## 2016-12-06 DIAGNOSIS — N186 End stage renal disease: Secondary | ICD-10-CM | POA: Diagnosis not present

## 2016-12-06 DIAGNOSIS — Z992 Dependence on renal dialysis: Secondary | ICD-10-CM | POA: Diagnosis not present

## 2016-12-06 DIAGNOSIS — N2581 Secondary hyperparathyroidism of renal origin: Secondary | ICD-10-CM | POA: Diagnosis not present

## 2016-12-06 DIAGNOSIS — D631 Anemia in chronic kidney disease: Secondary | ICD-10-CM | POA: Diagnosis not present

## 2016-12-08 DIAGNOSIS — D509 Iron deficiency anemia, unspecified: Secondary | ICD-10-CM | POA: Diagnosis not present

## 2016-12-08 DIAGNOSIS — Z992 Dependence on renal dialysis: Secondary | ICD-10-CM | POA: Diagnosis not present

## 2016-12-08 DIAGNOSIS — N186 End stage renal disease: Secondary | ICD-10-CM | POA: Diagnosis not present

## 2016-12-08 DIAGNOSIS — N2581 Secondary hyperparathyroidism of renal origin: Secondary | ICD-10-CM | POA: Diagnosis not present

## 2016-12-08 DIAGNOSIS — D631 Anemia in chronic kidney disease: Secondary | ICD-10-CM | POA: Diagnosis not present

## 2016-12-10 ENCOUNTER — Encounter: Payer: Self-pay | Admitting: Cardiology

## 2016-12-10 ENCOUNTER — Ambulatory Visit (INDEPENDENT_AMBULATORY_CARE_PROVIDER_SITE_OTHER): Payer: Medicare Other | Admitting: Cardiology

## 2016-12-10 VITALS — BP 196/71 | HR 65 | Ht <= 58 in | Wt 195.0 lb

## 2016-12-10 DIAGNOSIS — I6523 Occlusion and stenosis of bilateral carotid arteries: Secondary | ICD-10-CM

## 2016-12-10 DIAGNOSIS — E782 Mixed hyperlipidemia: Secondary | ICD-10-CM

## 2016-12-10 DIAGNOSIS — I2510 Atherosclerotic heart disease of native coronary artery without angina pectoris: Secondary | ICD-10-CM

## 2016-12-10 DIAGNOSIS — I10 Essential (primary) hypertension: Secondary | ICD-10-CM | POA: Diagnosis not present

## 2016-12-10 MED ORDER — NITROGLYCERIN 0.4 MG SL SUBL: 0.4000 mg | SUBLINGUAL_TABLET | SUBLINGUAL | 6 refills | Status: DC | PRN

## 2016-12-10 MED ORDER — AMLODIPINE BESYLATE 10 MG PO TABS: 10.0000 mg | ORAL_TABLET | Freq: Every day | ORAL | 3 refills | Status: DC

## 2016-12-10 NOTE — Patient Instructions (Addendum)
Medication Instructions:   Increase Norvasc to 10mg  daily.  Cnontinue all other medications.    Labwork: none  Testing/Procedures: none  Follow-Up: Your physician wants you to follow up in: 6 months.  You will receive a reminder letter in the mail one-two months in advance.  If you don't receive a letter, please call our office to schedule the follow up appointment   Any Other Special Instructions Will Be Listed Below (If Applicable).  If you need a refill on your cardiac medications before your next appointment, please call your pharmacy.

## 2016-12-10 NOTE — Progress Notes (Signed)
Clinical Summary Sherry Cox is a 56 y.o.female seen today for follow up of the following medical problems.   1. CAD - prior CABG in 2007 - denies any recent chest pain.  - compliant with meds  - for years has used NG regularly, multiple times a day. She feels this help with her feelings of palpitations and anxiety.  - no recent chest pain.   2. Carotid stenosis - followed by vascular  3. ESRD - compliant with HD, however missed yesterdays session due to recent health issues with her brother - HD T,TH,Sat  4. Hyperlipidemia - she is compliant with statin  5. HTN - compliant with meds - recently started on norvasc 5mg  by nephrology   Past Medical History:  Diagnosis Date  . Ankle injury   . Anxiety   . Anxiety disorder   . CAD (coronary artery disease)    Total RCA, stress echo [facility] 11/2008-no ischemia, EF 55%; h/o CABG  . Carotid artery stenosis    12/10/2008 doppler 81-85% RICA, 6-31% LICA/see evaluation by Dr. Oneida Alar 2011  . Ejection fraction    EF 60-65%, echo, June, 2009  . ESRD on dialysis [facility])    Transplant consideration  . Hemochromatosis   . Hx of CABG    Off pump LIMA to LAD  05/2006  . Hx of tobacco use, presenting hazards to health 04/26/2012  . Hyperlipidemia   . Irregular heart beat   . Murmur    October, 2012  . Neuropathy   . Overweight(278.02)   . Tobacco abuse      Allergies  Allergen Reactions  . Augmentin [Amoxicillin-Pot Clavulanate] Nausea And Vomiting    Vomiting     Current Outpatient Prescriptions  Medication Sig Dispense Refill  . albuterol (PROAIR HFA) 108 (90 BASE) MCG/ACT inhaler Inhale 2 puffs into the lungs every 4 (four) hours as needed. For wheezing    . aspirin EC 81 MG tablet Take 1 tablet (81 mg total) by mouth daily.    . clonazePAM (KLONOPIN) 0.5 MG tablet Take 1 tablet (0.5 mg total) by mouth 4 (four) times daily. 120 tablet 3  . diclofenac (VOLTAREN) 75 MG EC tablet Take 75 mg by mouth 2 (two) times  daily.    . folic acid (FOLVITE) 1 MG tablet Take 1 mg by mouth daily.    . folic acid-vitamin b complex-vitamin c-selenium-zinc (DIALYVITE) 3 MG TABS Take 1 tablet by mouth daily.      . furosemide (LASIX) 80 MG tablet Take 80 mg by mouth daily as needed.     Marland Kitchen HYDROcodone-acetaminophen (NORCO/VICODIN) 5-325 MG tablet Take 1 tablet by mouth 3 (three) times daily.  0  . levothyroxine (SYNTHROID, LEVOTHROID) 150 MCG tablet Take 175 mcg by mouth Daily.     Marland Kitchen LIPITOR 80 MG tablet TAKE 1 BY MOUTH DAILY      * 80MG  TOTAL *             * STOP SIMVASTATIN * 30 tablet 3  . LOPRESSOR 50 MG tablet TAKE 1 BY MOUTH TWO TIMES  DAILY ON MONDAY, WEDNESDAY AND FRIDAY AND 1 ON ALL    OTHER DAYS 70 tablet 5  . nitroGLYCERIN (NITROSTAT) 0.4 MG SL tablet Place 1 tablet (0.4 mg total) under the tongue every 5 (five) minutes as needed for chest pain. 225 tablet 0  . OXYGEN Inhale into the lungs. On 2    . sertraline (ZOLOFT) 100 MG tablet Take 1 tablet (100 mg  total) by mouth daily. 90 tablet 3  . sevelamer (RENVELA) 800 MG tablet Take 1,600-4,000 mg by mouth as directed. Take 5 tablets (4000mg ) by mouth with meals and 2 tablets by mouth with snacks.    . sodium bicarbonate 650 MG tablet Take 650 mg by mouth 2 (two) times daily.     Marland Kitchen zolpidem (AMBIEN) 5 MG tablet Take 1 tablet (5 mg total) by mouth at bedtime as needed. For sleep 30 tablet 2   No current facility-administered medications for this visit.      Past Surgical History:  Procedure Laterality Date  . AV FISTULA PLACEMENT     Has HD on Tues,Thurs, and Saturday at [facility]  . CARDIAC VALVE SURGERY  2007  . CAROTID ANGIOGRAM Bilateral 03/06/2013   Procedure: CAROTID ANGIOGRAM;  Surgeon: Elam Dutch, MD;  Location: [facility] CATH LAB;  Service: Cardiovascular;  Laterality: Bilateral;  . CHOLECYSTECTOMY  1995   Gall Bladder  . CORONARY ARTERY BYPASS GRAFT  05/2006   Off pump LIMA to LAD  . INNER EAR SURGERY    . Plastic Surgery on leg        Allergies  Allergen Reactions  . Augmentin [Amoxicillin-Pot Clavulanate] Nausea And Vomiting    Vomiting      Family History  Problem Relation Age of Onset  . Depression Sister   . Dementia Sister   . OCD Sister   . Diabetes Sister   . Hypertension Sister   . Heart attack Sister   . Bipolar disorder Brother   . Drug abuse Brother   . Diabetes Brother   . Hypertension Brother   . Heart disease Brother   . Bipolar disorder Other   . ADD / ADHD Other   . Seizures Other   . Bipolar disorder Other   . Alcohol abuse Other   . Dementia Mother   . Heart disease Mother   . Hypertension Mother   . Heart attack Mother   . Alcohol abuse Father   . Heart disease Father        Heart Disease before age 90  . Hypertension Father   . Heart attack Father   . Dementia Maternal Aunt   . OCD Sister   . Heart disease Sister        Heart Disease before age 51  . Paranoid behavior Neg Hx   . Schizophrenia Neg Hx   . Sexual abuse Neg Hx   . Physical abuse Neg Hx      Social History Sherry Cox reports that she has been smoking Cigarettes.  She started smoking about 44 years ago. She has a 9.00 pack-year smoking history. She has never used smokeless tobacco. Sherry Cox reports that she does not drink alcohol.   Review of Systems CONSTITUTIONAL: No weight loss, fever, chills, weakness or fatigue.  HEENT: Eyes: No visual loss, blurred vision, double vision or yellow sclerae.No hearing loss, sneezing, congestion, runny nose or sore throat.  SKIN: No rash or itching.  CARDIOVASCULAR: per hpi RESPIRATORY: No shortness of breath, cough or sputum.  GASTROINTESTINAL: No anorexia, nausea, vomiting or diarrhea. No abdominal pain or blood.  GENITOURINARY: No burning on urination, no polyuria NEUROLOGICAL: No headache, dizziness, syncope, paralysis, ataxia, numbness or tingling in the extremities. No change in bowel or bladder control.  MUSCULOSKELETAL: No muscle, back pain, joint  pain or stiffness.  LYMPHATICS: No enlarged nodes. No history of splenectomy.  PSYCHIATRIC: No history of depression or anxiety.  ENDOCRINOLOGIC: No reports  of sweating, cold or heat intolerance. No polyuria or polydipsia.  Marland Kitchen   Physical Examination Vitals:   12/10/16 1106  BP: (!) 196/71  Pulse: 65   Vitals:   12/10/16 1106  Weight: 195 lb (88.5 kg)  Height: 4\' 10"  (1.473 m)    Gen: resting comfortably, no acute distress HEENT: no scleral icterus, pupils equal round and reactive, no palptable cervical adenopathy,  CV: RRR, 2/6 systolic murmur RUSB, nojvd Resp: Clear to auscultation bilaterally GI: abdomen is soft, non-tender, non-distended, normal bowel sounds, no hepatosplenomegaly MSK: extremities are warm, no edema.  Skin: warm, no rash Neuro:  no focal deficits Psych: appropriate affect   Diagnostic Studies 04/2011 echo Study Conclusions  - Left ventricle: The cavity size was normal. Systolic function was normal. The estimated ejection fraction was in the range of 60% to 65%. Wall motion was normal; there were no regional wall motion abnormalities. Doppler parameters are consistent with abnormal left ventricular relaxation (grade 1 diastolic dysfunction). - Aortic valve: Valve area: 1.38cm^2(VTI). Valve area: 1.13cm^2 (Vmax).   01/2015 Carotid US RICA 40-59%, left bifurcatoin 40-59%     Assessment and Plan  1. CAD - no symptoms. Continue current meds  2. Carotid stenosis - continue to follow with vascular  3. HTN - elevated in clinic. We will increase norvasc to 10 mg daily  4. Hyperlipidemia - continue high dose statin    F/u 1 year      Arnoldo Lenis, M.D.

## 2016-12-11 DIAGNOSIS — N2581 Secondary hyperparathyroidism of renal origin: Secondary | ICD-10-CM | POA: Diagnosis not present

## 2016-12-11 DIAGNOSIS — Z992 Dependence on renal dialysis: Secondary | ICD-10-CM | POA: Diagnosis not present

## 2016-12-11 DIAGNOSIS — D509 Iron deficiency anemia, unspecified: Secondary | ICD-10-CM | POA: Diagnosis not present

## 2016-12-11 DIAGNOSIS — D631 Anemia in chronic kidney disease: Secondary | ICD-10-CM | POA: Diagnosis not present

## 2016-12-11 DIAGNOSIS — N186 End stage renal disease: Secondary | ICD-10-CM | POA: Diagnosis not present

## 2016-12-12 ENCOUNTER — Encounter (HOSPITAL_COMMUNITY): Payer: Self-pay | Admitting: Psychiatry

## 2016-12-12 ENCOUNTER — Ambulatory Visit (INDEPENDENT_AMBULATORY_CARE_PROVIDER_SITE_OTHER): Payer: Medicare Other | Admitting: Psychiatry

## 2016-12-12 DIAGNOSIS — F331 Major depressive disorder, recurrent, moderate: Secondary | ICD-10-CM

## 2016-12-12 DIAGNOSIS — F411 Generalized anxiety disorder: Secondary | ICD-10-CM | POA: Diagnosis not present

## 2016-12-12 NOTE — Progress Notes (Signed)
Patient:  Sherry Cox   DOB: 07-24-1960  MR Number: [id number]  Location: [place]:  [address] [place],  [place], [address]  Start: Wednesday 12/12/2016 11:18 AM  End: Wednesday 12/12/2016 12:00 PM  Provider/Observer:     Maurice Small, MSW, LCSW          Chief Complaint:      Chief Complaint  Patient presents with  . Depression    Reason For Service:     [name] is a 56 y.o. female who presents with all standing history of symptoms of depression and anxiety. She is a returning patient to this clinician and was last seen in 2015. She is resuming services due to grief and loss issues related to the deaths of 2 siblings in December 2017 and ongoing health issues.  Interventions Strategy:  Supportive/CBT  Participation Level:   Active  Participation Quality:  Appropriate      Behavioral Observation:  Fairly Groomed, Alert, and improved mood, less depressed   Current Psychosocial Factors: Deaths of two of her siblings in December 2018, her health,   Content of Session:    reviewed symptoms, praised and reinforced patient's increased interest in activities and increased social involvement, provided psychoeducation regarding the grieving process including discussing acute grief, complicated grief, and integrated grief, normalize symptoms of acute grief that may occur around holidays, anniversaries, and other reminders, assisted patient identify ways to cope with her deceased brother's birthday which is next week by identifying activity or memorializations in which patient could participate,  introduced the five stages of grief, assisted patient identify responses she has experienced related to the denial stage regarding the death of her sister, provided patient with handout on five stages of grief and assigned patient to read for next session   Current Status:   increased interest in activities, increased social involvementl, increased motivation, increased  energy, improved appetite, sleep difficulty, decreased anxiety and worry  Suicidal/Homicidal:    No  Patient Progress:   Good. Patient reports increased motivation and energy since last session. She says her thyroid medication along with her improved protein level have been very helpful. She reports increased involvement in activity including going out dancing for the past two weekends, going out to eat with her family, shopping, planning and cooking meals, going outside and sitting in her yard. She continues to miss her sister but tries to focus on positive memories. She is experiencing some anxiety related to deceased brother's birthday next week and her deceased sister's birthday next month.  Target Goals:   1.Learn and implement behavioral strategies to overcome depression.    2. Identify stages of grief patient has experienced in the continuum of the grieving process.    3. Acknowledge dependency on lost loved one and began to refocus life on independent actions to meet emotional needs.  Last Reviewed:   11/12/2016  Goals Addressed Today:    1,2,3  Plan:   Return in 2 weeks  Impression/Diagnosis:     Diagnosis:  Axis I:  Generalized Anxiety Disorder,           Axis II: Deferred  Velvie Thomaston, LCSW 12/12/2016

## 2016-12-13 ENCOUNTER — Encounter: Payer: Self-pay | Admitting: *Deleted

## 2016-12-13 DIAGNOSIS — N2581 Secondary hyperparathyroidism of renal origin: Secondary | ICD-10-CM | POA: Diagnosis not present

## 2016-12-13 DIAGNOSIS — D509 Iron deficiency anemia, unspecified: Secondary | ICD-10-CM | POA: Diagnosis not present

## 2016-12-13 DIAGNOSIS — N186 End stage renal disease: Secondary | ICD-10-CM | POA: Diagnosis not present

## 2016-12-13 DIAGNOSIS — D631 Anemia in chronic kidney disease: Secondary | ICD-10-CM | POA: Diagnosis not present

## 2016-12-13 DIAGNOSIS — Z992 Dependence on renal dialysis: Secondary | ICD-10-CM | POA: Diagnosis not present

## 2016-12-15 DIAGNOSIS — D631 Anemia in chronic kidney disease: Secondary | ICD-10-CM | POA: Diagnosis not present

## 2016-12-15 DIAGNOSIS — Z992 Dependence on renal dialysis: Secondary | ICD-10-CM | POA: Diagnosis not present

## 2016-12-15 DIAGNOSIS — D509 Iron deficiency anemia, unspecified: Secondary | ICD-10-CM | POA: Diagnosis not present

## 2016-12-15 DIAGNOSIS — N186 End stage renal disease: Secondary | ICD-10-CM | POA: Diagnosis not present

## 2016-12-15 DIAGNOSIS — N2581 Secondary hyperparathyroidism of renal origin: Secondary | ICD-10-CM | POA: Diagnosis not present

## 2016-12-18 DIAGNOSIS — D631 Anemia in chronic kidney disease: Secondary | ICD-10-CM | POA: Diagnosis not present

## 2016-12-18 DIAGNOSIS — Z992 Dependence on renal dialysis: Secondary | ICD-10-CM | POA: Diagnosis not present

## 2016-12-18 DIAGNOSIS — N186 End stage renal disease: Secondary | ICD-10-CM | POA: Diagnosis not present

## 2016-12-18 DIAGNOSIS — D509 Iron deficiency anemia, unspecified: Secondary | ICD-10-CM | POA: Diagnosis not present

## 2016-12-18 DIAGNOSIS — N2581 Secondary hyperparathyroidism of renal origin: Secondary | ICD-10-CM | POA: Diagnosis not present

## 2016-12-20 DIAGNOSIS — N2581 Secondary hyperparathyroidism of renal origin: Secondary | ICD-10-CM | POA: Diagnosis not present

## 2016-12-20 DIAGNOSIS — D509 Iron deficiency anemia, unspecified: Secondary | ICD-10-CM | POA: Diagnosis not present

## 2016-12-20 DIAGNOSIS — D631 Anemia in chronic kidney disease: Secondary | ICD-10-CM | POA: Diagnosis not present

## 2016-12-20 DIAGNOSIS — N186 End stage renal disease: Secondary | ICD-10-CM | POA: Diagnosis not present

## 2016-12-20 DIAGNOSIS — Z992 Dependence on renal dialysis: Secondary | ICD-10-CM | POA: Diagnosis not present

## 2016-12-22 DIAGNOSIS — Z992 Dependence on renal dialysis: Secondary | ICD-10-CM | POA: Diagnosis not present

## 2016-12-22 DIAGNOSIS — N186 End stage renal disease: Secondary | ICD-10-CM | POA: Diagnosis not present

## 2016-12-22 DIAGNOSIS — N2581 Secondary hyperparathyroidism of renal origin: Secondary | ICD-10-CM | POA: Diagnosis not present

## 2016-12-22 DIAGNOSIS — D509 Iron deficiency anemia, unspecified: Secondary | ICD-10-CM | POA: Diagnosis not present

## 2016-12-22 DIAGNOSIS — D631 Anemia in chronic kidney disease: Secondary | ICD-10-CM | POA: Diagnosis not present

## 2016-12-25 DIAGNOSIS — N186 End stage renal disease: Secondary | ICD-10-CM | POA: Diagnosis not present

## 2016-12-25 DIAGNOSIS — D631 Anemia in chronic kidney disease: Secondary | ICD-10-CM | POA: Diagnosis not present

## 2016-12-25 DIAGNOSIS — D509 Iron deficiency anemia, unspecified: Secondary | ICD-10-CM | POA: Diagnosis not present

## 2016-12-25 DIAGNOSIS — Z992 Dependence on renal dialysis: Secondary | ICD-10-CM | POA: Diagnosis not present

## 2016-12-25 DIAGNOSIS — N2581 Secondary hyperparathyroidism of renal origin: Secondary | ICD-10-CM | POA: Diagnosis not present

## 2016-12-26 ENCOUNTER — Encounter (HOSPITAL_COMMUNITY): Payer: Self-pay | Admitting: Psychiatry

## 2016-12-26 ENCOUNTER — Ambulatory Visit (INDEPENDENT_AMBULATORY_CARE_PROVIDER_SITE_OTHER): Payer: Medicare Other | Admitting: Psychiatry

## 2016-12-26 DIAGNOSIS — F411 Generalized anxiety disorder: Secondary | ICD-10-CM

## 2016-12-26 DIAGNOSIS — F331 Major depressive disorder, recurrent, moderate: Secondary | ICD-10-CM

## 2016-12-26 NOTE — Progress Notes (Signed)
Patient:  Sherry Cox   DOB: 1961-01-24  MR Number: [id number]  Location: [place]:  [address] [place],  [place], [address]  Start: Wednesday 12/26/2016 11:14 AM  End: Wednesday 12/26/2016 11:56 AM  Provider/Observer:     Maurice Small, MSW, LCSW          Chief Complaint:      Chief Complaint  Patient presents with  . Anxiety    Reason For Service:     [name] is a 56 y.o. female who presents with all standing history of symptoms of depression and anxiety. She is a returning patient to this clinician and was last seen in 2015. She is resuming services due to grief and loss issues related to the deaths of 2 siblings in December 2017 and ongoing health issues.  Interventions Strategy:  Supportive/grief therapy  Participation Level:   Active  Participation Quality:  Appropriate      Behavioral Observation:  Fairly Groomed, Alert, and improved mood, less depressed   Current Psychosocial Factors: Deaths of two of her siblings in December 2018, her health,   Content of Session:    reviewed symptoms, praised and reinforced patient's continued socialization and involvement in activity, discussed patient's efforts in coping with deceased brother's birthday and effects on patient, continued discussing the stages of grief, assisted patient identify responses she has experienced related to the anger and bargaining stages of grief, assigned patient to record responses she has experienced related to the depression and acceptance stages of grief and bring to next session, assigned patient to bring 1 item that reminds her of her sister to next session   Current Status:   increased interest in activities, increased social involvementl, increased motivation, increased energy, improved appetite, sleep difficulty, decreased anxiety and worry  Suicidal/Homicidal:    No        Patient Progress:   Good. Patient reports continued improved motivation and involvement in  activities since last session. She has resumed singing and  now is planning to start playing her electric keyboard again. She also has maintained involvement in socialization with family and friends and reports enjoying this. She reports she and her sisters shared funny memories of her deceased brother on his birthday and reports this was helpful. She continues to miss her deceased sister.    Target Goals:   1.Learn and implement behavioral strategies to overcome depression.    2. Identify stages of grief patient has experienced in the continuum of the grieving process.    3. Acknowledge dependency on lost loved one and began to refocus life on independent actions to meet emotional needs.  Last Reviewed:   11/12/2016  Goals Addressed Today:    1,2,3  Plan:   Return in 2 weeks  Impression/Diagnosis:     Diagnosis:  Axis I:  Generalized Anxiety Disorder,           Axis II: Deferred  BYNUM,PEGGY, LCSW 12/26/2016

## 2016-12-27 DIAGNOSIS — N2581 Secondary hyperparathyroidism of renal origin: Secondary | ICD-10-CM | POA: Diagnosis not present

## 2016-12-27 DIAGNOSIS — D631 Anemia in chronic kidney disease: Secondary | ICD-10-CM | POA: Diagnosis not present

## 2016-12-27 DIAGNOSIS — Z992 Dependence on renal dialysis: Secondary | ICD-10-CM | POA: Diagnosis not present

## 2016-12-27 DIAGNOSIS — D509 Iron deficiency anemia, unspecified: Secondary | ICD-10-CM | POA: Diagnosis not present

## 2016-12-27 DIAGNOSIS — N186 End stage renal disease: Secondary | ICD-10-CM | POA: Diagnosis not present

## 2016-12-29 DIAGNOSIS — N186 End stage renal disease: Secondary | ICD-10-CM | POA: Diagnosis not present

## 2016-12-29 DIAGNOSIS — D509 Iron deficiency anemia, unspecified: Secondary | ICD-10-CM | POA: Diagnosis not present

## 2016-12-29 DIAGNOSIS — Z992 Dependence on renal dialysis: Secondary | ICD-10-CM | POA: Diagnosis not present

## 2016-12-29 DIAGNOSIS — D631 Anemia in chronic kidney disease: Secondary | ICD-10-CM | POA: Diagnosis not present

## 2016-12-29 DIAGNOSIS — N2581 Secondary hyperparathyroidism of renal origin: Secondary | ICD-10-CM | POA: Diagnosis not present

## 2017-01-01 DIAGNOSIS — Z992 Dependence on renal dialysis: Secondary | ICD-10-CM | POA: Diagnosis not present

## 2017-01-01 DIAGNOSIS — D631 Anemia in chronic kidney disease: Secondary | ICD-10-CM | POA: Diagnosis not present

## 2017-01-01 DIAGNOSIS — N2581 Secondary hyperparathyroidism of renal origin: Secondary | ICD-10-CM | POA: Diagnosis not present

## 2017-01-01 DIAGNOSIS — D509 Iron deficiency anemia, unspecified: Secondary | ICD-10-CM | POA: Diagnosis not present

## 2017-01-01 DIAGNOSIS — N186 End stage renal disease: Secondary | ICD-10-CM | POA: Diagnosis not present

## 2017-01-03 DIAGNOSIS — N186 End stage renal disease: Secondary | ICD-10-CM | POA: Diagnosis not present

## 2017-01-03 DIAGNOSIS — Z992 Dependence on renal dialysis: Secondary | ICD-10-CM | POA: Diagnosis not present

## 2017-01-03 DIAGNOSIS — D509 Iron deficiency anemia, unspecified: Secondary | ICD-10-CM | POA: Diagnosis not present

## 2017-01-03 DIAGNOSIS — D631 Anemia in chronic kidney disease: Secondary | ICD-10-CM | POA: Diagnosis not present

## 2017-01-03 DIAGNOSIS — N2581 Secondary hyperparathyroidism of renal origin: Secondary | ICD-10-CM | POA: Diagnosis not present

## 2017-01-04 ENCOUNTER — Ambulatory Visit (HOSPITAL_COMMUNITY): Payer: Self-pay | Admitting: Psychiatry

## 2017-01-05 DIAGNOSIS — D509 Iron deficiency anemia, unspecified: Secondary | ICD-10-CM | POA: Diagnosis not present

## 2017-01-05 DIAGNOSIS — N186 End stage renal disease: Secondary | ICD-10-CM | POA: Diagnosis not present

## 2017-01-05 DIAGNOSIS — N2581 Secondary hyperparathyroidism of renal origin: Secondary | ICD-10-CM | POA: Diagnosis not present

## 2017-01-05 DIAGNOSIS — D631 Anemia in chronic kidney disease: Secondary | ICD-10-CM | POA: Diagnosis not present

## 2017-01-05 DIAGNOSIS — Z992 Dependence on renal dialysis: Secondary | ICD-10-CM | POA: Diagnosis not present

## 2017-01-07 ENCOUNTER — Encounter (HOSPITAL_COMMUNITY): Payer: Self-pay | Admitting: Psychiatry

## 2017-01-07 ENCOUNTER — Ambulatory Visit (INDEPENDENT_AMBULATORY_CARE_PROVIDER_SITE_OTHER): Payer: Medicare Other | Admitting: Psychiatry

## 2017-01-07 DIAGNOSIS — F339 Major depressive disorder, recurrent, unspecified: Secondary | ICD-10-CM

## 2017-01-07 DIAGNOSIS — D509 Iron deficiency anemia, unspecified: Secondary | ICD-10-CM | POA: Diagnosis not present

## 2017-01-07 DIAGNOSIS — N186 End stage renal disease: Secondary | ICD-10-CM | POA: Diagnosis not present

## 2017-01-07 DIAGNOSIS — N2581 Secondary hyperparathyroidism of renal origin: Secondary | ICD-10-CM | POA: Diagnosis not present

## 2017-01-07 DIAGNOSIS — F331 Major depressive disorder, recurrent, moderate: Secondary | ICD-10-CM

## 2017-01-07 DIAGNOSIS — F411 Generalized anxiety disorder: Secondary | ICD-10-CM | POA: Diagnosis not present

## 2017-01-07 DIAGNOSIS — D631 Anemia in chronic kidney disease: Secondary | ICD-10-CM | POA: Diagnosis not present

## 2017-01-07 DIAGNOSIS — Z992 Dependence on renal dialysis: Secondary | ICD-10-CM | POA: Diagnosis not present

## 2017-01-07 NOTE — Progress Notes (Signed)
Patient:  Sherry Cox   DOB: [birth year]  MR Number: [id number]  Location: [facility]:  [address] [place],  [place], [address]  Start: Monday 01/07/2017 9:32 AM End: Monday 01/07/2017 10:30 AM  Provider/Observer:     Maurice Small, MSW, LCSW          Chief Complaint:      Chief Complaint  Patient presents with  . Anxiety  . Depression    Reason For Service:     [name] is a 56 y.o. female who presents with all standing history of symptoms of depression and anxiety. She is a returning patient to this clinician and was last seen in 2015. She is resuming services due to grief and loss issues related to the deaths of 2 siblings in December 2017 and ongoing health issues.  Interventions Strategy:  Supportive/grief therapy  Participation Level:   Active  Participation Quality:  Appropriate      Behavioral Observation:  Fairly Groomed, Alert, and improved mood, less depressed   Current Psychosocial Factors: Deaths of two of her siblings in December 2018, her health,   Content of Session:    reviewed symptoms, praised and reinforced patient's continued socialization and involvement in activity, continued discussing the stages of grief, assisted patient identify responses she has experienced related to the depression and acceptance stages of grief, provided psychoeducation regarding going through stages and returning to previous stages, assisted patient identify negative and positive feelings she experiences when thinking of favorite objects/momentoes that remind her of her sister, assisted patient identify ways in which she depended on sister and ways she now is addressing that need,  praised and reinforced patient's use of support system, discussed possibility of termination at next session  Current Status:   increased interest in activities, increased social involvementl, increased motivation, increased energy, improved appetite, sleep difficulty, decreased  anxiety and worry  Suicidal/Homicidal:    No        Patient Progress:   Good. Patient reports continued improved motivation and involvement in activities since last session.  She  has maintained involvement in socialization with family and friends and reports enjoying this. She continues to miss her deceased sister but is not overwhelmed. She reports now focusing more on positive memories of sister ane expresses increased acceptance of sister's death. She reports she used to talk to sister a lot when troubled but now is  talking to husband more about her feelings. Her has been helpful and supportive. She also is talking more to her remaining two sisters. She is pleased with her progress, reinvesting in life, and says she is focusing more on improving her self-care. She is pleased she has been walking, managing her diet, and has lost significant amount of weight. Patient reports she didn't have time do homework as she was busy helping one of her sisters who has been having health challenges.    Target Goals:   1.Learn and implement behavioral strategies to overcome depression.    2. Identify stages of grief patient has experienced in the continuum of the grieving process.    3. Acknowledge dependency on lost loved one and began to refocus life on independent actions to meet emotional needs.  Last Reviewed:   11/12/2016  Goals Addressed Today:    1,2,3  Plan:   Return in 2 weeks  Impression/Diagnosis:     Diagnosis:  Axis I:  Generalized Anxiety Disorder,           Axis II: Deferred  [address], [place] 01/07/2017

## 2017-01-10 DIAGNOSIS — D631 Anemia in chronic kidney disease: Secondary | ICD-10-CM | POA: Diagnosis not present

## 2017-01-10 DIAGNOSIS — N186 End stage renal disease: Secondary | ICD-10-CM | POA: Diagnosis not present

## 2017-01-10 DIAGNOSIS — Z992 Dependence on renal dialysis: Secondary | ICD-10-CM | POA: Diagnosis not present

## 2017-01-10 DIAGNOSIS — N2581 Secondary hyperparathyroidism of renal origin: Secondary | ICD-10-CM | POA: Diagnosis not present

## 2017-01-10 DIAGNOSIS — D509 Iron deficiency anemia, unspecified: Secondary | ICD-10-CM | POA: Diagnosis not present

## 2017-01-11 ENCOUNTER — Encounter (HOSPITAL_COMMUNITY): Payer: Self-pay | Admitting: Psychiatry

## 2017-01-11 ENCOUNTER — Ambulatory Visit (INDEPENDENT_AMBULATORY_CARE_PROVIDER_SITE_OTHER): Payer: Medicare Other | Admitting: Psychiatry

## 2017-01-11 VITALS — BP 150/90 | HR 67 | Ht <= 58 in | Wt 182.0 lb

## 2017-01-11 DIAGNOSIS — I6523 Occlusion and stenosis of bilateral carotid arteries: Secondary | ICD-10-CM | POA: Diagnosis not present

## 2017-01-11 DIAGNOSIS — F411 Generalized anxiety disorder: Secondary | ICD-10-CM

## 2017-01-11 DIAGNOSIS — F331 Major depressive disorder, recurrent, moderate: Secondary | ICD-10-CM

## 2017-01-11 MED ORDER — CLONAZEPAM 0.5 MG PO TABS: 0.5000 mg | ORAL_TABLET | Freq: Four times a day (QID) | ORAL | 2 refills | Status: DC

## 2017-01-11 MED ORDER — SERTRALINE HCL 100 MG PO TABS: 100.0000 mg | ORAL_TABLET | Freq: Every day | ORAL | 3 refills | Status: DC

## 2017-01-11 MED ORDER — ZOLPIDEM TARTRATE 5 MG PO TABS: 5.0000 mg | ORAL_TABLET | Freq: Every evening | ORAL | 2 refills | Status: DC | PRN
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
## 2017-01-12 DIAGNOSIS — N186 End stage renal disease: Secondary | ICD-10-CM | POA: Diagnosis not present

## 2017-01-12 DIAGNOSIS — Z992 Dependence on renal dialysis: Secondary | ICD-10-CM | POA: Diagnosis not present

## 2017-01-12 DIAGNOSIS — D631 Anemia in chronic kidney disease: Secondary | ICD-10-CM | POA: Diagnosis not present

## 2017-01-12 DIAGNOSIS — N2581 Secondary hyperparathyroidism of renal origin: Secondary | ICD-10-CM | POA: Diagnosis not present

## 2017-01-12 DIAGNOSIS — D509 Iron deficiency anemia, unspecified: Secondary | ICD-10-CM | POA: Diagnosis not present

## 2017-01-15 DIAGNOSIS — D509 Iron deficiency anemia, unspecified: Secondary | ICD-10-CM | POA: Diagnosis not present

## 2017-01-15 DIAGNOSIS — D631 Anemia in chronic kidney disease: Secondary | ICD-10-CM | POA: Diagnosis not present

## 2017-01-15 DIAGNOSIS — Z992 Dependence on renal dialysis: Secondary | ICD-10-CM | POA: Diagnosis not present

## 2017-01-15 DIAGNOSIS — N186 End stage renal disease: Secondary | ICD-10-CM | POA: Diagnosis not present

## 2017-01-15 DIAGNOSIS — N2581 Secondary hyperparathyroidism of renal origin: Secondary | ICD-10-CM | POA: Diagnosis not present

## 2017-01-17 DIAGNOSIS — N2581 Secondary hyperparathyroidism of renal origin: Secondary | ICD-10-CM | POA: Diagnosis not present

## 2017-01-17 DIAGNOSIS — D509 Iron deficiency anemia, unspecified: Secondary | ICD-10-CM | POA: Diagnosis not present

## 2017-01-17 DIAGNOSIS — D631 Anemia in chronic kidney disease: Secondary | ICD-10-CM | POA: Diagnosis not present

## 2017-01-17 DIAGNOSIS — Z992 Dependence on renal dialysis: Secondary | ICD-10-CM | POA: Diagnosis not present

## 2017-01-17 DIAGNOSIS — N186 End stage renal disease: Secondary | ICD-10-CM | POA: Diagnosis not present

## 2017-01-19 DIAGNOSIS — D509 Iron deficiency anemia, unspecified: Secondary | ICD-10-CM | POA: Diagnosis not present

## 2017-01-19 DIAGNOSIS — N186 End stage renal disease: Secondary | ICD-10-CM | POA: Diagnosis not present

## 2017-01-19 DIAGNOSIS — Z992 Dependence on renal dialysis: Secondary | ICD-10-CM | POA: Diagnosis not present

## 2017-01-19 DIAGNOSIS — D631 Anemia in chronic kidney disease: Secondary | ICD-10-CM | POA: Diagnosis not present

## 2017-01-19 DIAGNOSIS — N2581 Secondary hyperparathyroidism of renal origin: Secondary | ICD-10-CM | POA: Diagnosis not present

## 2017-01-21 ENCOUNTER — Ambulatory Visit (INDEPENDENT_AMBULATORY_CARE_PROVIDER_SITE_OTHER): Payer: Medicare Other | Admitting: Psychiatry

## 2017-01-21 DIAGNOSIS — F411 Generalized anxiety disorder: Secondary | ICD-10-CM

## 2017-01-21 DIAGNOSIS — F331 Major depressive disorder, recurrent, moderate: Secondary | ICD-10-CM

## 2017-01-21 NOTE — Progress Notes (Signed)
Patient:  Sherry Cox   DOB: 1961-01-30  MR Number: [id number]  Location: [place]:  [address] [place],  [place],      [place]  Start: Monday 01/21/2017 11:03 AM End: Monday 01/21/2017 11:40 AM  Provider/Observer:     Maurice Small, MSW, LCSW          Chief Complaint:      Chief Complaint  Patient presents with  . Depression    Reason For Service:     Sherry Cox is a 56 y.o. female who presents with all standing history of symptoms of depression and anxiety. She is a returning patient to this clinician and was last seen in 2015. She is resuming services due to grief and loss issues related to the deaths of 2 siblings in December 2017 and ongoing health issues.  Interventions Strategy:  Supportive/grief therapy  Participation Level:   Active  Participation Quality:  Appropriate      Behavioral Observation:  Fairly Groomed, Alert, and, pleasant, talkative  Current Psychosocial Factors: Deaths of two of her siblings in December 2017, her health,   Content of Session:    reviewed symptoms, praised and reinforced patient's continued socialization and involvement in activity, praised and reinforced patient's skills in coping with the birthday of her deceased sister, discussed ways to continue positive self-care, discussed patient's progress, did termination   Current Status:   increased interest in activities, increased social involvementl, increased motivation, increased energy, improved appetite, decreased anxiety and worry  Suicidal/Homicidal:    No        Patient Progress:   Good. Patient reports continued improved motivation and involvement in activities since last session.  She  has maintained involvement in socialization with family and friends and reports enjoying this. She continues to miss her deceased siblings but is not overwhelmed. She reports her coping very well with her deceased sister's birthday 2 weeks ago. Patient reports acknowledging  her loss and sadness on that day but the and participating in positive/pleasant activities. Patient continues to focus on positive self-care and has continued walking and managing her diet. She reports continued strong support from her husband and her remaining siblings. Patient also is using her spirituality and reports resuming interest in her music and singing. very well now focusing more on positive memories of sister ane expresses increased acceptance of sister's death. She reports she used to talk to sister a lot when troubled but now is  talking to husband more about her feelings. Her has been helpful and supportive. She also is talking more to her remaining two sisters. She is pleased with her progress, reinvesting in life, and says she is focusing more on improving her self-care. She is pleased she has been walking, managing her diet, and has lost significant amount of weight. Patient reports she didn't have time do homework as she was busy helping one of her sisters who has been having health challenges.    Target Goals:   1.Learn and implement behavioral strategies to overcome depression.    2. Identify stages of grief patient has experienced in the continuum of the grieving process.    3. Acknowledge dependency on lost loved one and began to refocus life on independent actions to meet emotional needs.  Last Reviewed:   11/12/2016  Goals Addressed Today:    1,2,3  Plan:   Patient has accomplished goals. Therefore, patient and therapist agreed to terminate therapy services at this time. Patient will continue to see psychiatrist Dr.  Ross for medication management. Patient is encouraged to call this practice should she need therapy services in the future.  Impression/Diagnosis:     Diagnosis:  Axis I:  Generalized Anxiety Disorder,           Axis II: Deferred  BYNUM,PEGGY, LCSW   01/21/2017   Outpatient Therapist Discharge Summary  Sherry Cox    1960/10/30   Admission Date:   10/24/2016 Discharge Date:  01/21/2017 Reason for Discharge:  Treatment completed Diagnosis:  Axis I:  Generalized anxiety disorder   Axis V: 71-80  Comments:  Patient will continue to see psychiatrist Dr. Harrington Challenger for medication management. Patient is encouraged to call this practice should she need therapy services in the future.   Peggy E Bynum LCSW

## 2017-01-22 DIAGNOSIS — N2581 Secondary hyperparathyroidism of renal origin: Secondary | ICD-10-CM | POA: Diagnosis not present

## 2017-01-22 DIAGNOSIS — D509 Iron deficiency anemia, unspecified: Secondary | ICD-10-CM | POA: Diagnosis not present

## 2017-01-22 DIAGNOSIS — N186 End stage renal disease: Secondary | ICD-10-CM | POA: Diagnosis not present

## 2017-01-22 DIAGNOSIS — Z992 Dependence on renal dialysis: Secondary | ICD-10-CM | POA: Diagnosis not present

## 2017-01-22 DIAGNOSIS — D631 Anemia in chronic kidney disease: Secondary | ICD-10-CM | POA: Diagnosis not present

## 2017-01-23 DIAGNOSIS — N61 Mastitis without abscess: Secondary | ICD-10-CM | POA: Diagnosis not present

## 2017-01-24 DIAGNOSIS — N2581 Secondary hyperparathyroidism of renal origin: Secondary | ICD-10-CM | POA: Diagnosis not present

## 2017-01-24 DIAGNOSIS — N186 End stage renal disease: Secondary | ICD-10-CM | POA: Diagnosis not present

## 2017-01-24 DIAGNOSIS — Z992 Dependence on renal dialysis: Secondary | ICD-10-CM | POA: Diagnosis not present

## 2017-01-24 DIAGNOSIS — D509 Iron deficiency anemia, unspecified: Secondary | ICD-10-CM | POA: Diagnosis not present

## 2017-01-24 DIAGNOSIS — D631 Anemia in chronic kidney disease: Secondary | ICD-10-CM | POA: Diagnosis not present

## 2017-01-26 DIAGNOSIS — N186 End stage renal disease: Secondary | ICD-10-CM | POA: Diagnosis not present

## 2017-01-26 DIAGNOSIS — N2581 Secondary hyperparathyroidism of renal origin: Secondary | ICD-10-CM | POA: Diagnosis not present

## 2017-01-26 DIAGNOSIS — D631 Anemia in chronic kidney disease: Secondary | ICD-10-CM | POA: Diagnosis not present

## 2017-01-26 DIAGNOSIS — Z992 Dependence on renal dialysis: Secondary | ICD-10-CM | POA: Diagnosis not present

## 2017-01-26 DIAGNOSIS — D509 Iron deficiency anemia, unspecified: Secondary | ICD-10-CM | POA: Diagnosis not present

## 2017-01-29 DIAGNOSIS — D509 Iron deficiency anemia, unspecified: Secondary | ICD-10-CM | POA: Diagnosis not present

## 2017-01-29 DIAGNOSIS — D631 Anemia in chronic kidney disease: Secondary | ICD-10-CM | POA: Diagnosis not present

## 2017-01-29 DIAGNOSIS — Z992 Dependence on renal dialysis: Secondary | ICD-10-CM | POA: Diagnosis not present

## 2017-01-29 DIAGNOSIS — N2581 Secondary hyperparathyroidism of renal origin: Secondary | ICD-10-CM | POA: Diagnosis not present

## 2017-01-29 DIAGNOSIS — N186 End stage renal disease: Secondary | ICD-10-CM | POA: Diagnosis not present

## 2017-01-31 DIAGNOSIS — D509 Iron deficiency anemia, unspecified: Secondary | ICD-10-CM | POA: Diagnosis not present

## 2017-01-31 DIAGNOSIS — N186 End stage renal disease: Secondary | ICD-10-CM | POA: Diagnosis not present

## 2017-01-31 DIAGNOSIS — Z992 Dependence on renal dialysis: Secondary | ICD-10-CM | POA: Diagnosis not present

## 2017-01-31 DIAGNOSIS — N2581 Secondary hyperparathyroidism of renal origin: Secondary | ICD-10-CM | POA: Diagnosis not present

## 2017-01-31 DIAGNOSIS — D631 Anemia in chronic kidney disease: Secondary | ICD-10-CM | POA: Diagnosis not present

## 2017-02-02 DIAGNOSIS — N186 End stage renal disease: Secondary | ICD-10-CM | POA: Diagnosis not present

## 2017-02-02 DIAGNOSIS — N2581 Secondary hyperparathyroidism of renal origin: Secondary | ICD-10-CM | POA: Diagnosis not present

## 2017-02-02 DIAGNOSIS — D631 Anemia in chronic kidney disease: Secondary | ICD-10-CM | POA: Diagnosis not present

## 2017-02-02 DIAGNOSIS — Z992 Dependence on renal dialysis: Secondary | ICD-10-CM | POA: Diagnosis not present

## 2017-02-02 DIAGNOSIS — D509 Iron deficiency anemia, unspecified: Secondary | ICD-10-CM | POA: Diagnosis not present

## 2017-02-05 DIAGNOSIS — N2581 Secondary hyperparathyroidism of renal origin: Secondary | ICD-10-CM | POA: Diagnosis not present

## 2017-02-05 DIAGNOSIS — D631 Anemia in chronic kidney disease: Secondary | ICD-10-CM | POA: Diagnosis not present

## 2017-02-05 DIAGNOSIS — D509 Iron deficiency anemia, unspecified: Secondary | ICD-10-CM | POA: Diagnosis not present

## 2017-02-05 DIAGNOSIS — Z992 Dependence on renal dialysis: Secondary | ICD-10-CM | POA: Diagnosis not present

## 2017-02-05 DIAGNOSIS — N186 End stage renal disease: Secondary | ICD-10-CM | POA: Diagnosis not present

## 2017-02-07 DIAGNOSIS — Z992 Dependence on renal dialysis: Secondary | ICD-10-CM | POA: Diagnosis not present

## 2017-02-07 DIAGNOSIS — D631 Anemia in chronic kidney disease: Secondary | ICD-10-CM | POA: Diagnosis not present

## 2017-02-07 DIAGNOSIS — N186 End stage renal disease: Secondary | ICD-10-CM | POA: Diagnosis not present

## 2017-02-07 DIAGNOSIS — N2581 Secondary hyperparathyroidism of renal origin: Secondary | ICD-10-CM | POA: Diagnosis not present

## 2017-02-07 DIAGNOSIS — D509 Iron deficiency anemia, unspecified: Secondary | ICD-10-CM | POA: Diagnosis not present

## 2017-02-09 DIAGNOSIS — Z992 Dependence on renal dialysis: Secondary | ICD-10-CM | POA: Diagnosis not present

## 2017-02-09 DIAGNOSIS — N186 End stage renal disease: Secondary | ICD-10-CM | POA: Diagnosis not present

## 2017-02-09 DIAGNOSIS — D631 Anemia in chronic kidney disease: Secondary | ICD-10-CM | POA: Diagnosis not present

## 2017-02-09 DIAGNOSIS — N2581 Secondary hyperparathyroidism of renal origin: Secondary | ICD-10-CM | POA: Diagnosis not present

## 2017-02-09 DIAGNOSIS — D509 Iron deficiency anemia, unspecified: Secondary | ICD-10-CM | POA: Diagnosis not present

## 2017-02-12 DIAGNOSIS — N2581 Secondary hyperparathyroidism of renal origin: Secondary | ICD-10-CM | POA: Diagnosis not present

## 2017-02-12 DIAGNOSIS — D631 Anemia in chronic kidney disease: Secondary | ICD-10-CM | POA: Diagnosis not present

## 2017-02-12 DIAGNOSIS — Z992 Dependence on renal dialysis: Secondary | ICD-10-CM | POA: Diagnosis not present

## 2017-02-12 DIAGNOSIS — D509 Iron deficiency anemia, unspecified: Secondary | ICD-10-CM | POA: Diagnosis not present

## 2017-02-12 DIAGNOSIS — N186 End stage renal disease: Secondary | ICD-10-CM | POA: Diagnosis not present

## 2017-02-14 DIAGNOSIS — Z992 Dependence on renal dialysis: Secondary | ICD-10-CM | POA: Diagnosis not present

## 2017-02-14 DIAGNOSIS — N186 End stage renal disease: Secondary | ICD-10-CM | POA: Diagnosis not present

## 2017-02-14 DIAGNOSIS — D509 Iron deficiency anemia, unspecified: Secondary | ICD-10-CM | POA: Diagnosis not present

## 2017-02-14 DIAGNOSIS — N2581 Secondary hyperparathyroidism of renal origin: Secondary | ICD-10-CM | POA: Diagnosis not present

## 2017-02-14 DIAGNOSIS — D631 Anemia in chronic kidney disease: Secondary | ICD-10-CM | POA: Diagnosis not present

## 2017-02-16 DIAGNOSIS — N186 End stage renal disease: Secondary | ICD-10-CM | POA: Diagnosis not present

## 2017-02-16 DIAGNOSIS — D631 Anemia in chronic kidney disease: Secondary | ICD-10-CM | POA: Diagnosis not present

## 2017-02-16 DIAGNOSIS — Z992 Dependence on renal dialysis: Secondary | ICD-10-CM | POA: Diagnosis not present

## 2017-02-16 DIAGNOSIS — D509 Iron deficiency anemia, unspecified: Secondary | ICD-10-CM | POA: Diagnosis not present

## 2017-02-16 DIAGNOSIS — N2581 Secondary hyperparathyroidism of renal origin: Secondary | ICD-10-CM | POA: Diagnosis not present

## 2017-02-19 DIAGNOSIS — D509 Iron deficiency anemia, unspecified: Secondary | ICD-10-CM | POA: Diagnosis not present

## 2017-02-19 DIAGNOSIS — Z992 Dependence on renal dialysis: Secondary | ICD-10-CM | POA: Diagnosis not present

## 2017-02-19 DIAGNOSIS — D631 Anemia in chronic kidney disease: Secondary | ICD-10-CM | POA: Diagnosis not present

## 2017-02-19 DIAGNOSIS — N2581 Secondary hyperparathyroidism of renal origin: Secondary | ICD-10-CM | POA: Diagnosis not present

## 2017-02-19 DIAGNOSIS — N186 End stage renal disease: Secondary | ICD-10-CM | POA: Diagnosis not present

## 2017-02-21 DIAGNOSIS — D631 Anemia in chronic kidney disease: Secondary | ICD-10-CM | POA: Diagnosis not present

## 2017-02-21 DIAGNOSIS — N186 End stage renal disease: Secondary | ICD-10-CM | POA: Diagnosis not present

## 2017-02-21 DIAGNOSIS — D509 Iron deficiency anemia, unspecified: Secondary | ICD-10-CM | POA: Diagnosis not present

## 2017-02-21 DIAGNOSIS — Z992 Dependence on renal dialysis: Secondary | ICD-10-CM | POA: Diagnosis not present

## 2017-02-21 DIAGNOSIS — N2581 Secondary hyperparathyroidism of renal origin: Secondary | ICD-10-CM | POA: Diagnosis not present

## 2017-02-23 DIAGNOSIS — N2581 Secondary hyperparathyroidism of renal origin: Secondary | ICD-10-CM | POA: Diagnosis not present

## 2017-02-23 DIAGNOSIS — D509 Iron deficiency anemia, unspecified: Secondary | ICD-10-CM | POA: Diagnosis not present

## 2017-02-23 DIAGNOSIS — N186 End stage renal disease: Secondary | ICD-10-CM | POA: Diagnosis not present

## 2017-02-23 DIAGNOSIS — Z992 Dependence on renal dialysis: Secondary | ICD-10-CM | POA: Diagnosis not present

## 2017-02-23 DIAGNOSIS — D631 Anemia in chronic kidney disease: Secondary | ICD-10-CM | POA: Diagnosis not present

## 2017-02-26 DIAGNOSIS — N186 End stage renal disease: Secondary | ICD-10-CM | POA: Diagnosis not present

## 2017-02-26 DIAGNOSIS — D509 Iron deficiency anemia, unspecified: Secondary | ICD-10-CM | POA: Diagnosis not present

## 2017-02-26 DIAGNOSIS — D631 Anemia in chronic kidney disease: Secondary | ICD-10-CM | POA: Diagnosis not present

## 2017-02-26 DIAGNOSIS — N2581 Secondary hyperparathyroidism of renal origin: Secondary | ICD-10-CM | POA: Diagnosis not present

## 2017-02-26 DIAGNOSIS — Z992 Dependence on renal dialysis: Secondary | ICD-10-CM | POA: Diagnosis not present

## 2017-02-28 DIAGNOSIS — D509 Iron deficiency anemia, unspecified: Secondary | ICD-10-CM | POA: Diagnosis not present

## 2017-02-28 DIAGNOSIS — Z992 Dependence on renal dialysis: Secondary | ICD-10-CM | POA: Diagnosis not present

## 2017-02-28 DIAGNOSIS — D631 Anemia in chronic kidney disease: Secondary | ICD-10-CM | POA: Diagnosis not present

## 2017-02-28 DIAGNOSIS — N2581 Secondary hyperparathyroidism of renal origin: Secondary | ICD-10-CM | POA: Diagnosis not present

## 2017-02-28 DIAGNOSIS — N186 End stage renal disease: Secondary | ICD-10-CM | POA: Diagnosis not present

## 2017-03-02 DIAGNOSIS — Z992 Dependence on renal dialysis: Secondary | ICD-10-CM | POA: Diagnosis not present

## 2017-03-02 DIAGNOSIS — N186 End stage renal disease: Secondary | ICD-10-CM | POA: Diagnosis not present

## 2017-03-02 DIAGNOSIS — D509 Iron deficiency anemia, unspecified: Secondary | ICD-10-CM | POA: Diagnosis not present

## 2017-03-02 DIAGNOSIS — N2581 Secondary hyperparathyroidism of renal origin: Secondary | ICD-10-CM | POA: Diagnosis not present

## 2017-03-02 DIAGNOSIS — D631 Anemia in chronic kidney disease: Secondary | ICD-10-CM | POA: Diagnosis not present

## 2017-03-05 DIAGNOSIS — N2581 Secondary hyperparathyroidism of renal origin: Secondary | ICD-10-CM | POA: Diagnosis not present

## 2017-03-05 DIAGNOSIS — D509 Iron deficiency anemia, unspecified: Secondary | ICD-10-CM | POA: Diagnosis not present

## 2017-03-05 DIAGNOSIS — N186 End stage renal disease: Secondary | ICD-10-CM | POA: Diagnosis not present

## 2017-03-05 DIAGNOSIS — D631 Anemia in chronic kidney disease: Secondary | ICD-10-CM | POA: Diagnosis not present

## 2017-03-05 DIAGNOSIS — Z992 Dependence on renal dialysis: Secondary | ICD-10-CM | POA: Diagnosis not present

## 2017-03-07 DIAGNOSIS — N2581 Secondary hyperparathyroidism of renal origin: Secondary | ICD-10-CM | POA: Diagnosis not present

## 2017-03-07 DIAGNOSIS — D509 Iron deficiency anemia, unspecified: Secondary | ICD-10-CM | POA: Diagnosis not present

## 2017-03-07 DIAGNOSIS — N186 End stage renal disease: Secondary | ICD-10-CM | POA: Diagnosis not present

## 2017-03-07 DIAGNOSIS — D631 Anemia in chronic kidney disease: Secondary | ICD-10-CM | POA: Diagnosis not present

## 2017-03-07 DIAGNOSIS — Z992 Dependence on renal dialysis: Secondary | ICD-10-CM | POA: Diagnosis not present

## 2017-03-09 DIAGNOSIS — Z992 Dependence on renal dialysis: Secondary | ICD-10-CM | POA: Diagnosis not present

## 2017-03-09 DIAGNOSIS — N2581 Secondary hyperparathyroidism of renal origin: Secondary | ICD-10-CM | POA: Diagnosis not present

## 2017-03-09 DIAGNOSIS — N186 End stage renal disease: Secondary | ICD-10-CM | POA: Diagnosis not present

## 2017-03-09 DIAGNOSIS — D509 Iron deficiency anemia, unspecified: Secondary | ICD-10-CM | POA: Diagnosis not present

## 2017-03-09 DIAGNOSIS — D631 Anemia in chronic kidney disease: Secondary | ICD-10-CM | POA: Diagnosis not present

## 2017-03-10 ENCOUNTER — Emergency Department (HOSPITAL_COMMUNITY): Payer: Medicare Other

## 2017-03-10 ENCOUNTER — Inpatient Hospital Stay (HOSPITAL_COMMUNITY)
Admission: EM | Admit: 2017-03-10 | Discharge: 2017-03-15 | DRG: 515 | Disposition: A | Discharge status: Home Health | Payer: Medicare Other | Attending: Internal Medicine | Admitting: Internal Medicine

## 2017-03-10 ENCOUNTER — Encounter (HOSPITAL_COMMUNITY): Payer: Self-pay | Admitting: Emergency Medicine

## 2017-03-10 DIAGNOSIS — S82001A Unspecified fracture of right patella, initial encounter for closed fracture: Secondary | ICD-10-CM | POA: Diagnosis not present

## 2017-03-10 DIAGNOSIS — S82031A Displaced transverse fracture of right patella, initial encounter for closed fracture: Principal | ICD-10-CM | POA: Diagnosis present

## 2017-03-10 DIAGNOSIS — Z992 Dependence on renal dialysis: Secondary | ICD-10-CM | POA: Diagnosis not present

## 2017-03-10 DIAGNOSIS — W010XXA Fall on same level from slipping, tripping and stumbling without subsequent striking against object, initial encounter: Secondary | ICD-10-CM | POA: Diagnosis present

## 2017-03-10 DIAGNOSIS — W19XXXA Unspecified fall, initial encounter: Secondary | ICD-10-CM | POA: Diagnosis not present

## 2017-03-10 DIAGNOSIS — Z8249 Family history of ischemic heart disease and other diseases of the circulatory system: Secondary | ICD-10-CM | POA: Diagnosis not present

## 2017-03-10 DIAGNOSIS — Z7982 Long term (current) use of aspirin: Secondary | ICD-10-CM | POA: Diagnosis not present

## 2017-03-10 DIAGNOSIS — Z833 Family history of diabetes mellitus: Secondary | ICD-10-CM

## 2017-03-10 DIAGNOSIS — Z6841 Body Mass Index (BMI) 40.0 and over, adult: Secondary | ICD-10-CM

## 2017-03-10 DIAGNOSIS — N2581 Secondary hyperparathyroidism of renal origin: Secondary | ICD-10-CM | POA: Diagnosis present

## 2017-03-10 DIAGNOSIS — M25561 Pain in right knee: Secondary | ICD-10-CM | POA: Diagnosis not present

## 2017-03-10 DIAGNOSIS — F17210 Nicotine dependence, cigarettes, uncomplicated: Secondary | ICD-10-CM | POA: Diagnosis present

## 2017-03-10 DIAGNOSIS — Z881 Allergy status to other antibiotic agents status: Secondary | ICD-10-CM

## 2017-03-10 DIAGNOSIS — E039 Hypothyroidism, unspecified: Secondary | ICD-10-CM | POA: Diagnosis not present

## 2017-03-10 DIAGNOSIS — Z7951 Long term (current) use of inhaled steroids: Secondary | ICD-10-CM | POA: Diagnosis not present

## 2017-03-10 DIAGNOSIS — M898X9 Other specified disorders of bone, unspecified site: Secondary | ICD-10-CM | POA: Diagnosis present

## 2017-03-10 DIAGNOSIS — Z811 Family history of alcohol abuse and dependence: Secondary | ICD-10-CM | POA: Diagnosis not present

## 2017-03-10 DIAGNOSIS — I120 Hypertensive chronic kidney disease with stage 5 chronic kidney disease or end stage renal disease: Secondary | ICD-10-CM | POA: Diagnosis present

## 2017-03-10 DIAGNOSIS — S8991XA Unspecified injury of right lower leg, initial encounter: Secondary | ICD-10-CM | POA: Diagnosis not present

## 2017-03-10 DIAGNOSIS — S82091A Other fracture of right patella, initial encounter for closed fracture: Secondary | ICD-10-CM | POA: Diagnosis not present

## 2017-03-10 DIAGNOSIS — S82009A Unspecified fracture of unspecified patella, initial encounter for closed fracture: Secondary | ICD-10-CM | POA: Diagnosis present

## 2017-03-10 DIAGNOSIS — Y92009 Unspecified place in unspecified non-institutional (private) residence as the place of occurrence of the external cause: Secondary | ICD-10-CM | POA: Diagnosis not present

## 2017-03-10 DIAGNOSIS — R9431 Abnormal electrocardiogram [ECG] [EKG]: Secondary | ICD-10-CM | POA: Diagnosis not present

## 2017-03-10 DIAGNOSIS — Z419 Encounter for procedure for purposes other than remedying health state, unspecified: Secondary | ICD-10-CM

## 2017-03-10 DIAGNOSIS — E871 Hypo-osmolality and hyponatremia: Secondary | ICD-10-CM | POA: Diagnosis not present

## 2017-03-10 DIAGNOSIS — E669 Obesity, unspecified: Secondary | ICD-10-CM | POA: Diagnosis present

## 2017-03-10 DIAGNOSIS — Z818 Family history of other mental and behavioral disorders: Secondary | ICD-10-CM | POA: Diagnosis not present

## 2017-03-10 DIAGNOSIS — N186 End stage renal disease: Secondary | ICD-10-CM | POA: Diagnosis present

## 2017-03-10 DIAGNOSIS — I2510 Atherosclerotic heart disease of native coronary artery without angina pectoris: Secondary | ICD-10-CM | POA: Diagnosis present

## 2017-03-10 DIAGNOSIS — F419 Anxiety disorder, unspecified: Secondary | ICD-10-CM | POA: Diagnosis present

## 2017-03-10 DIAGNOSIS — S82001S Unspecified fracture of right patella, sequela: Secondary | ICD-10-CM | POA: Diagnosis not present

## 2017-03-10 DIAGNOSIS — D631 Anemia in chronic kidney disease: Secondary | ICD-10-CM | POA: Diagnosis present

## 2017-03-10 DIAGNOSIS — R011 Cardiac murmur, unspecified: Secondary | ICD-10-CM | POA: Diagnosis present

## 2017-03-10 DIAGNOSIS — E785 Hyperlipidemia, unspecified: Secondary | ICD-10-CM | POA: Diagnosis present

## 2017-03-10 DIAGNOSIS — Z9049 Acquired absence of other specified parts of digestive tract: Secondary | ICD-10-CM

## 2017-03-10 DIAGNOSIS — Z09 Encounter for follow-up examination after completed treatment for conditions other than malignant neoplasm: Secondary | ICD-10-CM

## 2017-03-10 DIAGNOSIS — Z951 Presence of aortocoronary bypass graft: Secondary | ICD-10-CM

## 2017-03-10 DIAGNOSIS — T148XXA Other injury of unspecified body region, initial encounter: Secondary | ICD-10-CM | POA: Diagnosis not present

## 2017-03-10 DIAGNOSIS — E8770 Fluid overload, unspecified: Secondary | ICD-10-CM | POA: Diagnosis not present

## 2017-03-10 LAB — CBC
HCT: 31.7 % — ABNORMAL LOW (ref 36.0–46.0)
Hemoglobin: 9.9 g/dL — ABNORMAL LOW (ref 12.0–15.0)
MCH: 30.1 pg (ref 26.0–34.0)
MCHC: 31.2 g/dL (ref 30.0–36.0)
MCV: 96.4 fL (ref 78.0–100.0)
Platelets: 219 10*3/uL (ref 150–400)
RBC: 3.29 MIL/uL — ABNORMAL LOW (ref 3.87–5.11)
RDW: 17.9 % — ABNORMAL HIGH (ref 11.5–15.5)
WBC: 9.6 10*3/uL (ref 4.0–10.5)

## 2017-03-10 LAB — COMPREHENSIVE METABOLIC PANEL
ALK PHOS: 69 U/L (ref 38–126)
ALT: 16 U/L (ref 14–54)
ANION GAP: 11 (ref 5–15)
AST: 18 U/L (ref 15–41)
Albumin: 3.3 g/dL — ABNORMAL LOW (ref 3.5–5.0)
BUN: 16 mg/dL (ref 6–20)
CALCIUM: 8.5 mg/dL — AB (ref 8.9–10.3)
CO2: 25 mmol/L (ref 22–32)
Chloride: 98 mmol/L — ABNORMAL LOW (ref 101–111)
Creatinine, Ser: 5.33 mg/dL — ABNORMAL HIGH (ref 0.44–1.00)
GFR calc non Af Amer: 8 mL/min — ABNORMAL LOW (ref >60)
GFR, EST AFRICAN AMERICAN: 9 mL/min — AB (ref >60)
Glucose, Bld: 110 mg/dL — ABNORMAL HIGH (ref 65–99)
Potassium: 4.6 mmol/L (ref 3.5–5.1)
SODIUM: 134 mmol/L — AB (ref 135–145)
TOTAL PROTEIN: 6.1 g/dL — AB (ref 6.5–8.1)
Total Bilirubin: 0.6 mg/dL (ref 0.3–1.2)

## 2017-03-10 LAB — PROTIME-INR
INR: 0.99
PROTHROMBIN TIME: 13 s (ref 11.4–15.2)

## 2017-03-10 MED ORDER — ACETAMINOPHEN 650 MG RE SUPP: 650.0000 mg | Freq: Four times a day (QID) | RECTAL | Status: DC | PRN

## 2017-03-10 MED ORDER — HEPARIN SODIUM (PORCINE) 5000 UNIT/ML IJ SOLN
5000.0000 [IU] | Freq: Once | INTRAMUSCULAR | Status: AC
  Administered 2017-03-10: 5000 [IU] via SUBCUTANEOUS
  Filled 2017-03-10: qty 1

## 2017-03-10 MED ORDER — ACETAMINOPHEN 325 MG PO TABS
650.0000 mg | ORAL_TABLET | Freq: Four times a day (QID) | ORAL | Status: DC | PRN
  Administered 2017-03-11: 650 mg via ORAL

## 2017-03-10 MED ORDER — HYDROCODONE-ACETAMINOPHEN 5-325 MG PO TABS
1.0000 | ORAL_TABLET | Freq: Three times a day (TID) | ORAL | Status: DC
  Administered 2017-03-10 – 2017-03-11 (×2): 1 via ORAL
  Filled 2017-03-10 (×2): qty 1

## 2017-03-10 MED ORDER — ONDANSETRON HCL 4 MG/2ML IJ SOLN
4.0000 mg | Freq: Four times a day (QID) | INTRAMUSCULAR | Status: DC | PRN
  Administered 2017-03-12 – 2017-03-13 (×2): 4 mg via INTRAVENOUS
  Filled 2017-03-10: qty 2

## 2017-03-10 MED ORDER — SERTRALINE HCL 100 MG PO TABS
100.0000 mg | ORAL_TABLET | Freq: Every day | ORAL | Status: DC
  Administered 2017-03-12 – 2017-03-15 (×4): 100 mg via ORAL
  Filled 2017-03-10 (×4): qty 1

## 2017-03-10 MED ORDER — POVIDONE-IODINE 10 % EX SWAB: 2.0000 "application " | Freq: Once | CUTANEOUS | Status: DC

## 2017-03-10 MED ORDER — SODIUM CHLORIDE 0.9 % IV SOLN
250.0000 mL | INTRAVENOUS | Status: DC | PRN
  Administered 2017-03-11 (×2): via INTRAVENOUS

## 2017-03-10 MED ORDER — DIPHENHYDRAMINE HCL 50 MG/ML IJ SOLN
25.0000 mg | Freq: Four times a day (QID) | INTRAMUSCULAR | Status: DC | PRN
  Administered 2017-03-11: 25 mg via INTRAVENOUS
  Filled 2017-03-10: qty 1

## 2017-03-10 MED ORDER — CHLORHEXIDINE GLUCONATE 4 % EX LIQD
60.0000 mL | Freq: Once | CUTANEOUS | Status: AC
  Administered 2017-03-11: 4 via TOPICAL
  Filled 2017-03-10 (×2): qty 60

## 2017-03-10 MED ORDER — ASPIRIN EC 81 MG PO TBEC: 81.0000 mg | DELAYED_RELEASE_TABLET | Freq: Every day | ORAL | Status: DC

## 2017-03-10 MED ORDER — RENA-VITE PO TABS
1.0000 | ORAL_TABLET | Freq: Every day | ORAL | Status: DC
  Administered 2017-03-11 – 2017-03-14 (×4): 1 via ORAL
  Filled 2017-03-10 (×5): qty 1

## 2017-03-10 MED ORDER — HYDROCODONE-ACETAMINOPHEN 5-325 MG PO TABS
2.0000 | ORAL_TABLET | Freq: Once | ORAL | Status: AC
  Administered 2017-03-10: 2 via ORAL
  Filled 2017-03-10: qty 2

## 2017-03-10 MED ORDER — LEVOTHYROXINE SODIUM 75 MCG PO TABS
175.0000 ug | ORAL_TABLET | Freq: Every day | ORAL | Status: DC
  Administered 2017-03-12 – 2017-03-15 (×4): 175 ug via ORAL
  Filled 2017-03-10 (×5): qty 1

## 2017-03-10 MED ORDER — SODIUM CHLORIDE 0.9% FLUSH: 3.0000 mL | INTRAVENOUS | Status: DC | PRN

## 2017-03-10 MED ORDER — AMLODIPINE BESYLATE 10 MG PO TABS
10.0000 mg | ORAL_TABLET | Freq: Every day | ORAL | Status: DC
  Administered 2017-03-10 – 2017-03-14 (×4): 10 mg via ORAL
  Filled 2017-03-10 (×5): qty 1

## 2017-03-10 MED ORDER — ZOLPIDEM TARTRATE 5 MG PO TABS: 5.0000 mg | ORAL_TABLET | Freq: Every evening | ORAL | Status: DC | PRN

## 2017-03-10 MED ORDER — CLONAZEPAM 0.5 MG PO TABS
0.5000 mg | ORAL_TABLET | Freq: Three times a day (TID) | ORAL | Status: DC | PRN
  Administered 2017-03-11: 0.5 mg via ORAL
  Filled 2017-03-10: qty 1

## 2017-03-10 MED ORDER — SODIUM CHLORIDE 0.9% FLUSH
3.0000 mL | Freq: Two times a day (BID) | INTRAVENOUS | Status: DC
  Administered 2017-03-10 – 2017-03-15 (×9): 3 mL via INTRAVENOUS

## 2017-03-10 MED ORDER — ALBUTEROL SULFATE (2.5 MG/3ML) 0.083% IN NEBU: 3.0000 mL | INHALATION_SOLUTION | RESPIRATORY_TRACT | Status: DC | PRN

## 2017-03-10 MED ORDER — FOLIC ACID 1 MG PO TABS
1.0000 mg | ORAL_TABLET | Freq: Every day | ORAL | Status: DC
  Administered 2017-03-12 – 2017-03-15 (×4): 1 mg via ORAL
  Filled 2017-03-10 (×4): qty 1

## 2017-03-10 MED ORDER — SEVELAMER CARBONATE 800 MG PO TABS
1600.0000 mg | ORAL_TABLET | ORAL | Status: DC | PRN
  Administered 2017-03-14: 1600 mg via ORAL
  Filled 2017-03-10: qty 2

## 2017-03-10 MED ORDER — ATORVASTATIN CALCIUM 80 MG PO TABS
80.0000 mg | ORAL_TABLET | Freq: Every day | ORAL | Status: DC
Start: 2017-03-11 — End: 2017-03-15
  Administered 2017-03-11 – 2017-03-14 (×4): 80 mg via ORAL
  Filled 2017-03-10 (×4): qty 1

## 2017-03-10 MED ORDER — METOPROLOL TARTRATE 50 MG PO TABS
50.0000 mg | ORAL_TABLET | Freq: Two times a day (BID) | ORAL | Status: DC
  Administered 2017-03-10 – 2017-03-15 (×8): 50 mg via ORAL
  Filled 2017-03-10 (×10): qty 1

## 2017-03-10 MED ORDER — CLINDAMYCIN PHOSPHATE 900 MG/50ML IV SOLN
900.0000 mg | INTRAVENOUS | Status: AC
  Administered 2017-03-11: 900 mg via INTRAVENOUS
  Filled 2017-03-10 (×2): qty 50

## 2017-03-10 MED ORDER — SODIUM BICARBONATE 650 MG PO TABS
650.0000 mg | ORAL_TABLET | Freq: Two times a day (BID) | ORAL | Status: DC
  Administered 2017-03-10 – 2017-03-15 (×9): 650 mg via ORAL
  Filled 2017-03-10 (×9): qty 1

## 2017-03-10 MED ORDER — SALINE SPRAY 0.65 % NA SOLN
1.0000 | NASAL | Status: DC | PRN
  Filled 2017-03-10: qty 44

## 2017-03-10 MED ORDER — HYDROMORPHONE HCL 1 MG/ML IJ SOLN
0.5000 mg | INTRAMUSCULAR | Status: DC | PRN
  Administered 2017-03-11 – 2017-03-12 (×5): 0.5 mg via INTRAVENOUS
  Filled 2017-03-10 (×5): qty 1

## 2017-03-10 MED ORDER — NITROGLYCERIN 0.4 MG SL SUBL
0.4000 mg | SUBLINGUAL_TABLET | SUBLINGUAL | Status: DC | PRN
Start: 2017-03-10 — End: 2017-03-15

## 2017-03-10 MED ORDER — SEVELAMER CARBONATE 800 MG PO TABS
4000.0000 mg | ORAL_TABLET | Freq: Three times a day (TID) | ORAL | Status: DC
Start: 2017-03-11 — End: 2017-03-15
  Administered 2017-03-11 – 2017-03-15 (×10): 4000 mg via ORAL
  Filled 2017-03-10 (×10): qty 5

## 2017-03-10 NOTE — ED Triage Notes (Signed)
Received pt from home via EMS with c/o slip and fall while walking back into her house. Pt presents with deformity to right knee. Pt given 100 mcg of Fentanyl by EMS and 4 mg of Zofran. Pt has home O2 at 2 L.

## 2017-03-10 NOTE — ED Notes (Signed)
Patient returned from CT

## 2017-03-10 NOTE — Progress Notes (Signed)
Orthopedic Tech Progress Note Patient Details:  Sherry Cox [birth year] [id number]  Ortho Devices Type of Ortho Device: Knee Immobilizer Ortho Device/Splint Location: RLE Ortho Device/Splint Interventions: Ordered, Application   Braulio Bosch 03/10/2017, 8:18 PM

## 2017-03-10 NOTE — ED Notes (Signed)
Patient is stable and ready to be transport to the floor at this time.  Report was called to 5W RN.  Belongings taken with the patient to the floor.   

## 2017-03-10 NOTE — ED Provider Notes (Addendum)
[place] DEPT Provider Note   CSN: [id number] Arrival date & time: 03/10/17  1646     History   Chief Complaint Chief Complaint  Patient presents with  . Fall  . Knee Injury    HPI Sherry Cox is a 56 y.o. female.  HPI  56 year old female history of coronary artery disease, on dialysis with mechanical falls just prior to arrival. She tripped on some water and landed on her right knee. She has pain and swelling to the right knee. She is unable to bear weight on it. She is transported here via EMS and received pain medication and route. Currently her pain is 7 out of 10. She denies a numbness or tingling distal to the injury. She denies any proximal injury or other injuries. She did not strike her head. She is not on blood thinners. She does take an aspirin daily. She is dialyzed Tuesday Thursday Saturday and Saturdays.  Past Medical History:  Diagnosis Date  . Ankle injury   . Anxiety   . Anxiety disorder   . CAD (coronary artery disease)    Total RCA, stress echo [facility] 11/2008-no ischemia, EF 55%; h/o CABG  . Carotid artery stenosis    12/10/2008 doppler 24-23% RICA, 5-36% LICA/see evaluation by Dr. Oneida Alar 2011  . Ejection fraction    EF 60-65%, echo, June, 2009  . ESRD on dialysis [facility])    Transplant consideration  . Hemochromatosis   . Hx of CABG    Off pump LIMA to LAD  05/2006  . Hx of tobacco use, presenting hazards to health 04/26/2012  . Hyperlipidemia   . Irregular heart beat   . Murmur    October, 2012  . Neuropathy   . Overweight(278.02)   . Tobacco abuse     Patient Active Problem List   Diagnosis Date Noted  . Flow murmur 12/22/2014  . Tobacco abuse 12/11/2013  . 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 Occlusion and stenosis of carotid artery without mention of cerebral infarction 08/30/2011  . Ejection fraction   . CAD (coronary artery disease)   . Carotid artery stenosis   . Hyperlipidemia   . ESRD on dialysis ([place])   . Hx of  tobacco use, presenting hazards to health   . Hx of CABG   . HYPOTHYROIDISM 09/26/2009  . Overweight(278.02) 06/09/2009  . ANXIETY 06/09/2009    Past Surgical History:  Procedure Laterality Date  . AV FISTULA PLACEMENT     Has HD on Tues,Thurs, and Saturday at [facility]  . CARDIAC VALVE SURGERY  2007  . CAROTID ANGIOGRAM Bilateral 03/06/2013   Procedure: CAROTID ANGIOGRAM;  Surgeon: Elam Dutch, MD;  Location: [facility] CATH LAB;  Service: Cardiovascular;  Laterality: Bilateral;  . CHOLECYSTECTOMY  1995   Gall Bladder  . CORONARY ARTERY BYPASS GRAFT  05/2006   Off pump LIMA to LAD  . INNER EAR SURGERY    . Plastic Surgery on leg      OB History    No data available       Home Medications    Prior to Admission medications   Medication Sig Start Date End Date Taking? Authorizing Provider  albuterol (PROAIR HFA) 108 (90 BASE) MCG/ACT inhaler Inhale 2 puffs into the lungs every 4 (four) hours as needed. For wheezing    [provider]  amLODipine (NORVASC) 10 MG tablet Take 1 tablet (10 mg total) by mouth daily. 12/10/16   Arnoldo Lenis, MD  aspirin EC 81  MG tablet Take 1 tablet (81 mg total) by mouth daily. 12/11/13   Carlena Bjornstad, MD  clonazePAM (KLONOPIN) 0.5 MG tablet Take 1 tablet (0.5 mg total) by mouth 4 (four) times daily. 01/11/17   Cloria Spring, MD  diclofenac (VOLTAREN) 75 MG EC tablet Take 75 mg by mouth 2 (two) times daily.    [provider]  folic acid (FOLVITE) 1 MG tablet Take 1 mg by mouth daily.    [provider]  folic acid-vitamin b complex-vitamin c-selenium-zinc (DIALYVITE) 3 MG TABS Take 1 tablet by mouth daily.      [provider]  HYDROcodone-acetaminophen (NORCO/VICODIN) 5-325 MG tablet Take 1 tablet by mouth 3 (three) times daily. 12/20/15   [provider]  levothyroxine (SYNTHROID, LEVOTHROID) 150 MCG tablet Take 175 mcg by mouth Daily.  03/21/11   [provider]  LIPITOR 80 MG tablet TAKE 1  BY MOUTH DAILY      * 80MG  TOTAL *             * STOP SIMVASTATIN * 11/22/16   Branch, Alphonse Guild, MD  LOPRESSOR 50 MG tablet TAKE 1 BY MOUTH TWO TIMES  DAILY ON MONDAY, [facility] AND FRIDAY AND 1 ON ALL    OTHER DAYS 11/29/16   Arnoldo Lenis, MD  nitroGLYCERIN (NITROSTAT) 0.4 MG SL tablet Place 1 tablet (0.4 mg total) under the tongue every 5 (five) minutes as needed for chest pain. 12/10/16   Arnoldo Lenis, MD  OXYGEN Inhale into the lungs. On 2    [provider]  sertraline (ZOLOFT) 100 MG tablet Take 1 tablet (100 mg total) by mouth daily. 01/11/17   Cloria Spring, MD  sevelamer (RENVELA) 800 MG tablet Take 1,600-4,000 mg by mouth as directed. Take 5 tablets (4000mg ) by mouth with meals and 2 tablets by mouth with snacks.    [provider]  sodium bicarbonate 650 MG tablet Take 650 mg by mouth 2 (two) times daily.     [provider]  zolpidem (AMBIEN) 5 MG tablet Take 1 tablet (5 mg total) by mouth at bedtime as needed. For sleep 01/11/17   Cloria Spring, MD    Family History Family History  Problem Relation Age of Onset  . Depression Sister   . Dementia Sister   . OCD Sister   . Diabetes Sister   . Hypertension Sister   . Heart attack Sister   . Bipolar disorder Brother   . Drug abuse Brother   . Diabetes Brother   . Hypertension Brother   . Heart disease Brother   . Bipolar disorder Other   . ADD / ADHD Other   . Seizures Other   . Bipolar disorder Other   . Alcohol abuse Other   . Dementia Mother   . Heart disease Mother   . Hypertension Mother   . Heart attack Mother   . Alcohol abuse Father   . Heart disease Father        Heart Disease before age 49  . Hypertension Father   . Heart attack Father   . Dementia Maternal Aunt   . OCD Sister   . Heart disease Sister        Heart Disease before age 75  . Paranoid behavior Neg Hx   . Schizophrenia Neg Hx   . Sexual abuse Neg Hx   . Physical abuse Neg Hx     Social History Social  History  Substance  Use Topics  . Smoking status: Current Every Day Smoker    Packs/day: 0.30    Years: 30.00    Types: Cigarettes    Start date: 02/02/1972  . Smokeless tobacco: Never Used  . Alcohol use No     Allergies   Augmentin [amoxicillin-pot clavulanate]   Review of Systems Review of Systems  All other systems reviewed and are negative.    Physical Exam Updated Vital Signs BP (!) 166/55   Pulse 73   Temp 97.8 F (36.6 C)   Ht 1.473 m (4\' 10" )   Wt 80.3 kg (177 lb)   SpO2 96%   BMI 36.99 kg/m   Physical Exam  Constitutional: She appears well-developed and well-nourished.  HENT:  Head: Normocephalic and atraumatic.  Eyes: Pupils are equal, round, and reactive to light.  Neck: Normal range of motion.  Cardiovascular: Normal rate and regular rhythm.   Pulmonary/Chest: Effort normal and breath sounds normal.  Abdominal: Soft.  Musculoskeletal:       Legs: Swelling  Nursing note and vitals reviewed.    ED Treatments / Results  Labs (all labs ordered are listed, but only abnormal results are displayed) Labs Reviewed - No data to display  EKG  EKG Interpretation None       Radiology Dg Knee Complete 4 Views Right  Result Date: 03/10/2017 CLINICAL DATA:  Fall onto knee EXAM: RIGHT KNEE - COMPLETE 4+ VIEW COMPARISON:  None. FINDINGS: There is a fracture horizontally through the body of the patella with distraction of the fracture fragments by 3 cm. Prepatellar soft tissue swelling. No fracture the tibia or femur. IMPRESSION: Horizontal fracture through the midbody of the patella with 3 cm of distraction Electronically Signed   By: Suzy Bouchard M.D.   On: 03/10/2017 17:33    Procedures Procedures (including critical care time)  Medications Ordered in ED Medications - No data to display   Initial Impression / Assessment and Plan / ED Course  I have reviewed the triage vital signs and the nursing notes.  Pertinent labs & imaging results that  were available during my care of the patient were reviewed by me and considered in my medical decision making (see chart for details).      1 fall  2 displaced distracted patellar fracture 3 end-stage renal.disease on dialysis 4 anemia likely secondary to #3 appears stable Discussed with Dr. Rolena Infante, on for orthopedic surgery. He states that he will discuss with colleagues and likely have fixed tomorrow. Patient to be nothing by mouth after midnight Discussed with Dr. Maudie Mercury, on call for hospitalist and he will admit. Final Clinical Impressions(s) / ED Diagnoses   Final diagnoses:  Fall, initial encounter  Closed displaced fracture of right patella, unspecified fracture morphology, initial encounter    New Prescriptions New Prescriptions   No medications on file     Pattricia Boss, MD 03/10/17 Ear[place] Navy    Pattricia Boss, MD 03/10/17 [phone]

## 2017-03-10 NOTE — H&P (Addendum)
TRH H&P   Patient Demographics:    Sherry Cox, is a 56 y.o. female  MRN: [id number]   DOB - 19-Oct-1960  Admit Date - 03/10/2017  Outpatient Primary MD for the patient is Patient, No Pcp Per  Referring MD/NP/PA: Pattricia Boss  Outpatient Specialists:  Carlyle Dolly (cardiology) Fran Lowes (nephrology)   Patient coming from: home  Chief Complaint  Patient presents with  . Fall  . Knee Injury      HPI:    Sherry Cox  is a 56 y.o. female, CAD s/p CABG, carotid stenosis,  w ESRD on HD (T, T, S), no hx of osteoporosis, apparently c/o Fall onto a divider for a screen door on the ground where she hurt her knee cap.  No syncope. No chest pain.  Pt presented to ED for evaluation of right knee pain.   In ED, Xray=> IMPRESSION: Horizontal fracture through the midbody of the patella with 3 cm of distraction Orthopedics was consulted by ED, and will be by to evaluate the patient, appreciate their input.   Pt states that she had her routine dialysis yesterday and will not need dialysis til Tuesday.  She had a full session.      Review of systems:    In addition to the HPI above,   No Fever-chills, No Headache, No changes with Vision or hearing, No problems swallowing food or Liquids, No Chest pain, Cough or Shortness of Breath, No Abdominal pain, No Nausea or Vommitting, Bowel movements are regular, No Blood in stool or Urine, No dysuria, No new skin rashes or bruises, Pt is typically able to climb a flight of stairs without difficulty, stable on her HD,  Always completes per patient and no difficulty with hypotension No new weakness, tingling, numbness in any extremity, No recent weight gain or loss, No polyuria, polydypsia or polyphagia, No significant Mental Stressors.  A full 10 point Review of Systems was done, except as stated above, all other  Review of Systems were negative.   With Past History of the following :    Past Medical History:  Diagnosis Date  . Ankle injury   . Anxiety   . Anxiety disorder   . CAD (coronary artery disease)    Total RCA, stress echo [facility] 11/2008-no ischemia, EF 55%; h/o CABG  . Carotid artery stenosis    12/10/2008 doppler 70-48% RICA, 8-89% LICA/see evaluation by Dr. Oneida Alar 2011  . Ejection fraction    EF 60-65%, echo, June, 2009  . ESRD on dialysis [facility])    Transplant consideration  . Hemochromatosis   . Hx of CABG    Off pump LIMA to LAD  05/2006  . Hx of tobacco use, presenting hazards to health 04/26/2012  . Hyperlipidemia   . Irregular heart beat   . Murmur    October, 2012  . Neuropathy   . Overweight(278.02)   .  Tobacco abuse       Past Surgical History:  Procedure Laterality Date  . AV FISTULA PLACEMENT     Has HD on Tues,Thurs, and Saturday at [facility]  . CARDIAC VALVE SURGERY  2007  . CAROTID ANGIOGRAM Bilateral 03/06/2013   Procedure: CAROTID ANGIOGRAM;  Surgeon: Elam Dutch, MD;  Location: [facility] CATH LAB;  Service: Cardiovascular;  Laterality: Bilateral;  . CHOLECYSTECTOMY  1995   Gall Bladder  . CORONARY ARTERY BYPASS GRAFT  05/2006   Off pump LIMA to LAD  . INNER EAR SURGERY    . Plastic Surgery on leg        Social History:     Social History  Substance Use Topics  . Smoking status: Current Every Day Smoker    Packs/day: 0.30    Years: 30.00    Types: Cigarettes    Start date: 02/02/1972  . Smokeless tobacco: Never Used  . Alcohol use No     Lives - at home w husband  Mobility - walks well without assitance     Family History :     Family History  Problem Relation Age of Onset  . Depression Sister   . Dementia Sister   . OCD Sister   . Diabetes Sister   . Hypertension Sister   . Heart attack Sister   . Bipolar disorder Brother   . Drug abuse Brother   . Diabetes Brother   . Hypertension Brother   . Heart disease Brother   .  Bipolar disorder Other   . ADD / ADHD Other   . Seizures Other   . Bipolar disorder Other   . Alcohol abuse Other   . Dementia Mother   . Heart disease Mother   . Hypertension Mother   . Heart attack Mother   . Alcohol abuse Father   . Heart disease Father        Heart Disease before age 69  . Hypertension Father   . Heart attack Father   . Dementia Maternal Aunt   . OCD Sister   . Heart disease Sister        Heart Disease before age 31  . Paranoid behavior Neg Hx   . Schizophrenia Neg Hx   . Sexual abuse Neg Hx   . Physical abuse Neg Hx      Home Medications:   Prior to Admission medications   Medication Sig Start Date End Date Taking? Authorizing Provider  albuterol (PROAIR HFA) 108 (90 BASE) MCG/ACT inhaler Inhale 2 puffs into the lungs every 4 (four) hours as needed. For wheezing    [provider]  amLODipine (NORVASC) 10 MG tablet Take 1 tablet (10 mg total) by mouth daily. 12/10/16   Arnoldo Lenis, MD  aspirin EC 81 MG tablet Take 1 tablet (81 mg total) by mouth daily. 12/11/13   Carlena Bjornstad, MD  clonazePAM (KLONOPIN) 0.5 MG tablet Take 1 tablet (0.5 mg total) by mouth 4 (four) times daily. 01/11/17   Cloria Spring, MD  diclofenac (VOLTAREN) 75 MG EC tablet Take 75 mg by mouth 2 (two) times daily.    [provider]  folic acid (FOLVITE) 1 MG tablet Take 1 mg by mouth daily.    [provider]  folic acid-vitamin b complex-vitamin c-selenium-zinc (DIALYVITE) 3 MG TABS Take 1 tablet by mouth daily.      [provider]  HYDROcodone-acetaminophen (NORCO/VICODIN) 5-325 MG tablet Take 1 tablet by mouth 3 (three)  times daily. 12/20/15   [provider]  levothyroxine (SYNTHROID, LEVOTHROID) 150 MCG tablet Take 175 mcg by mouth Daily.  03/21/11   [provider]  LIPITOR 80 MG tablet TAKE 1 BY MOUTH DAILY      * 80MG  TOTAL *             * STOP SIMVASTATIN * 11/22/16   Branch, Alphonse Guild, MD  LOPRESSOR 50 MG tablet TAKE 1  BY MOUTH TWO TIMES  DAILY ON MONDAY, [facility] AND FRIDAY AND 1 ON ALL    OTHER DAYS 11/29/16   Arnoldo Lenis, MD  nitroGLYCERIN (NITROSTAT) 0.4 MG SL tablet Place 1 tablet (0.4 mg total) under the tongue every 5 (five) minutes as needed for chest pain. 12/10/16   Arnoldo Lenis, MD  OXYGEN Inhale into the lungs. On 2    [provider]  sertraline (ZOLOFT) 100 MG tablet Take 1 tablet (100 mg total) by mouth daily. 01/11/17   Cloria Spring, MD  sevelamer (RENVELA) 800 MG tablet Take 1,600-4,000 mg by mouth as directed. Take 5 tablets (4000mg ) by mouth with meals and 2 tablets by mouth with snacks.    [provider]  sodium bicarbonate 650 MG tablet Take 650 mg by mouth 2 (two) times daily.     [provider]  zolpidem (AMBIEN) 5 MG tablet Take 1 tablet (5 mg total) by mouth at bedtime as needed. For sleep 01/11/17   Cloria Spring, MD     Allergies:     Allergies  Allergen Reactions  . Augmentin [Amoxicillin-Pot Clavulanate] Nausea And Vomiting    Vomiting     Physical Exam:   Vitals  Blood pressure (!) 162/65, pulse 76, temperature 97.8 F (36.6 C), resp. rate (!) 21, height 4\' 10"  (1.473 m), weight 80.3 kg (177 lb), SpO2 97 %.   1. General  lying in bed in NAD,    2. Normal affect and insight, Not Suicidal or Homicidal, Awake Alert, Oriented X 3.  3. No F.N deficits, ALL C.Nerves Intact, Strength 5/5 all 4 extremities, Sensation intact all 4 extremities, Plantars down going.  4. Ears and Eyes appear Normal, Conjunctivae clear, PERRLA. Moist Oral Mucosa.  5. Supple Neck, No JVD, No cervical lymphadenopathy appriciated, No Carotid Bruits.  6. Symmetrical Chest wall movement, Good air movement bilaterally, CTAB.  7. RRR, s1, s2, 2/6 sem llsb Midline scar  8. Positive Bowel Sounds, Abdomen Soft, No tenderness, No organomegaly appriciated,No rebound -guarding or rigidity.  9.  No Cyanosis, Normal Skin Turgor, No Skin Rash or Bruise.  10.  Good muscle tone,  joints appear normal , no effusions, Normal ROM.  11. No Palpable Lymph Nodes in Neck or Axillae  Left AVF, + thrill/ bruit  Right knee bruise slightly around patella,  Good dp pulse   Data Review:    CBC  Recent Labs Lab 03/10/17 1847  WBC 9.6  HGB 9.9*  HCT 31.7*  PLT 219  MCV 96.4  MCH 30.1  MCHC 31.2  RDW 17.9*   ------------------------------------------------------------------------------------------------------------------  Chemistries   Recent Labs Lab 03/10/17 1847  NA 134*  K 4.6  CL 98*  CO2 25  GLUCOSE 110*  BUN 16  CREATININE 5.33*  CALCIUM 8.5*  AST 18  ALT 16  ALKPHOS 69  BILITOT 0.6   ------------------------------------------------------------------------------------------------------------------ estimated creatinine clearance is 10.5 mL/min (A) (by C-G formula based on SCr of 5.33 mg/dL (H)). ------------------------------------------------------------------------------------------------------------------ No results for input(s): TSH, T4TOTAL, T3FREE, THYROIDAB  in the last 72 hours.  Invalid input(s): FREET3  Coagulation profile  Recent Labs Lab 03/10/17 1847  INR 0.99   ------------------------------------------------------------------------------------------------------------------- No results for input(s): DDIMER in the last 72 hours. -------------------------------------------------------------------------------------------------------------------  Cardiac Enzymes No results for input(s): CKMB, TROPONINI, MYOGLOBIN in the last 168 hours.  Invalid input(s): CK ------------------------------------------------------------------------------------------------------------------ No results found for: BNP   ---------------------------------------------------------------------------------------------------------------  Urinalysis No results found for: COLORURINE, APPEARANCEUR, LABSPEC, PHURINE, GLUCOSEU, HGBUR,  BILIRUBINUR, KETONESUR, PROTEINUR, UROBILINOGEN, NITRITE, LEUKOCYTESUR  ----------------------------------------------------------------------------------------------------------------   Imaging Results:    Ct Knee Right Wo Contrast  Result Date: 03/10/2017 CLINICAL DATA:  Patient slipped and fell on within floor home today. Right knee pain. EXAM: CT OF THE  KNEE WITHOUT CONTRAST TECHNIQUE: Multidetector CT imaging of the knee was performed according to the standard protocol. Multiplanar CT image reconstructions were also generated. COMPARISON:  None. FINDINGS: Bones/Joint/Cartilage An acute, closed, horizontal fracture through the body of the patella with 3.4 cm of distraction is noted. Tiny avulsion off the medial patella may reflect a tiny avulsion involving the medial patellofemoral ligament. There is a shallow impaction fracture of the anterior lateral femoral condyle, series 68, image 81 and series 3, image 86. Ligaments Suboptimally assessed by CT. Muscles and Tendons No intramuscular hemorrhage or atrophy. Soft tissues Prepatellar soft tissue contusion and swelling is noted with interposed 3.7 x 5.7 x 3 cm hematoma between the patellar fragments. Small joint effusion. IMPRESSION: 1. Acute, closed, horizontal fracture through the body of the patella with 3.4 cm of distraction noted. Interposed hematoma with overlying prepatellar soft tissue swelling. 2. Tiny avulsion fracture right off the medial aspect of the patella may reflect a small avulsion involving the medial patellofemoral ligament. 3. Subtle shallow impaction fracture of the anterior aspect of the lateral femoral condyle. Electronically Signed   By: Ashley Royalty M.D.   On: 03/10/2017 19:28   Dg Knee Complete 4 Views Right  Result Date: 03/10/2017 CLINICAL DATA:  Fall onto knee EXAM: RIGHT KNEE - COMPLETE 4+ VIEW COMPARISON:  None. FINDINGS: There is a fracture horizontally through the body of the patella with distraction of the fracture  fragments by 3 cm. Prepatellar soft tissue swelling. No fracture the tibia or femur. IMPRESSION: Horizontal fracture through the midbody of the patella with 3 cm of distraction Electronically Signed   By: Suzy Bouchard M.D.   On: 03/10/2017 17:33     Assessment & Plan:    Active Problems:   Patellar fracture    R patella fracture NPO after midnite Orthopedics consulted by ED, appreciate input.  Knee imobilizer placed Pt is medically cleared to have surgery tomorrow Benefit of having surgery outweigts the risk at this time.   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  Code Status FULL CODE  Likely DC to  home  Condition GUARDED    Consults called:  orthopedics by ED  Admission status: inpatient  Time spent in minutes : 45 minutes   Jani Gravel M.D on 03/10/2017 at 7:58 PM  Between 7am to 7pm - Pager - [phone]. After 7pm go to [email or web address] - password [email or web address]  [facility] - Office  [phone]

## 2017-03-10 NOTE — ED Notes (Signed)
Patient transported to X-ray 

## 2017-03-11 ENCOUNTER — Inpatient Hospital Stay (HOSPITAL_COMMUNITY): Payer: Medicare Other

## 2017-03-11 ENCOUNTER — Inpatient Hospital Stay (HOSPITAL_COMMUNITY): Payer: Medicare Other | Admitting: Anesthesiology

## 2017-03-11 ENCOUNTER — Encounter (HOSPITAL_COMMUNITY)
Admission: EM | Disposition: A | Discharge status: Home Health | Payer: Self-pay | Source: Home / Self Care | Attending: Internal Medicine

## 2017-03-11 DIAGNOSIS — S82031A Displaced transverse fracture of right patella, initial encounter for closed fracture: Secondary | ICD-10-CM | POA: Diagnosis present

## 2017-03-11 DIAGNOSIS — W19XXXA Unspecified fall, initial encounter: Secondary | ICD-10-CM

## 2017-03-11 HISTORY — PX: ORIF PATELLA: SHX5033

## 2017-03-11 LAB — COMPREHENSIVE METABOLIC PANEL
ALBUMIN: 3 g/dL — AB (ref 3.5–5.0)
ALT: 15 U/L (ref 14–54)
ANION GAP: 11 (ref 5–15)
AST: 29 U/L (ref 15–41)
Alkaline Phosphatase: 63 U/L (ref 38–126)
BILIRUBIN TOTAL: 1 mg/dL (ref 0.3–1.2)
BUN: 18 mg/dL (ref 6–20)
CO2: 27 mmol/L (ref 22–32)
Calcium: 8.3 mg/dL — ABNORMAL LOW (ref 8.9–10.3)
Chloride: 98 mmol/L — ABNORMAL LOW (ref 101–111)
Creatinine, Ser: 6.24 mg/dL — ABNORMAL HIGH (ref 0.44–1.00)
GFR calc Af Amer: 8 mL/min — ABNORMAL LOW (ref >60)
GFR calc non Af Amer: 7 mL/min — ABNORMAL LOW (ref >60)
Glucose, Bld: 74 mg/dL (ref 65–99)
POTASSIUM: 5.2 mmol/L — AB (ref 3.5–5.1)
SODIUM: 136 mmol/L (ref 135–145)
TOTAL PROTEIN: 5.2 g/dL — AB (ref 6.5–8.1)

## 2017-03-11 LAB — CBC WITH DIFFERENTIAL/PLATELET
BASOS ABS: 0 10*3/uL (ref 0.0–0.1)
BASOS PCT: 0 %
Eosinophils Absolute: 0.1 10*3/uL (ref 0.0–0.7)
Eosinophils Relative: 2 %
HCT: 28.2 % — ABNORMAL LOW (ref 36.0–46.0)
HEMOGLOBIN: 8.8 g/dL — AB (ref 12.0–15.0)
Lymphocytes Relative: 15 %
Lymphs Abs: 1.1 10*3/uL (ref 0.7–4.0)
MCH: 30.3 pg (ref 26.0–34.0)
MCHC: 31.2 g/dL (ref 30.0–36.0)
MCV: 97.2 fL (ref 78.0–100.0)
MONO ABS: 0.5 10*3/uL (ref 0.1–1.0)
Monocytes Relative: 7 %
NEUTROS ABS: 5.7 10*3/uL (ref 1.7–7.7)
NEUTROS PCT: 76 %
Platelets: 238 10*3/uL (ref 150–400)
RBC: 2.9 MIL/uL — AB (ref 3.87–5.11)
RDW: 18.1 % — AB (ref 11.5–15.5)
WBC: 7.5 10*3/uL (ref 4.0–10.5)

## 2017-03-11 LAB — SURGICAL PCR SCREEN
MRSA, PCR: NEGATIVE
STAPHYLOCOCCUS AUREUS: NEGATIVE

## 2017-03-11 SURGERY — OPEN REDUCTION INTERNAL FIXATION (ORIF) PATELLA
Anesthesia: Spinal | Laterality: Right

## 2017-03-11 MED ORDER — OXYCODONE HCL 5 MG PO TABS
ORAL_TABLET | ORAL | Status: AC
  Filled 2017-03-11: qty 2

## 2017-03-11 MED ORDER — BUPIVACAINE IN DEXTROSE 0.75-8.25 % IT SOLN
INTRATHECAL | Status: DC | PRN
  Administered 2017-03-11: 1.4 mL via INTRATHECAL

## 2017-03-11 MED ORDER — CLINDAMYCIN PHOSPHATE 600 MG/50ML IV SOLN
600.0000 mg | Freq: Four times a day (QID) | INTRAVENOUS | Status: AC
  Administered 2017-03-11 – 2017-03-12 (×3): 600 mg via INTRAVENOUS
  Filled 2017-03-11 (×3): qty 50

## 2017-03-11 MED ORDER — FENTANYL CITRATE (PF) 100 MCG/2ML IJ SOLN
INTRAMUSCULAR | Status: AC
  Administered 2017-03-11: 17:00:00
  Filled 2017-03-11: qty 2

## 2017-03-11 MED ORDER — FENTANYL CITRATE (PF) 250 MCG/5ML IJ SOLN
INTRAMUSCULAR | Status: AC
  Filled 2017-03-11: qty 5

## 2017-03-11 MED ORDER — 0.9 % SODIUM CHLORIDE (POUR BTL) OPTIME
TOPICAL | Status: DC | PRN
  Administered 2017-03-11: 1000 mL

## 2017-03-11 MED ORDER — MIDAZOLAM HCL 2 MG/2ML IJ SOLN
INTRAMUSCULAR | Status: AC
  Filled 2017-03-11: qty 2

## 2017-03-11 MED ORDER — PROPOFOL 500 MG/50ML IV EMUL
INTRAVENOUS | Status: DC | PRN
  Administered 2017-03-11: 25 ug/kg/min via INTRAVENOUS

## 2017-03-11 MED ORDER — FENTANYL CITRATE (PF) 100 MCG/2ML IJ SOLN
25.0000 ug | INTRAMUSCULAR | Status: DC | PRN
  Administered 2017-03-11 (×3): 50 ug via INTRAVENOUS

## 2017-03-11 MED ORDER — SODIUM CHLORIDE 0.9 % IV SOLN
INTRAVENOUS | Status: DC
  Administered 2017-03-11: 13:00:00 via INTRAVENOUS

## 2017-03-11 MED ORDER — ACETAMINOPHEN 650 MG RE SUPP: 650.0000 mg | Freq: Four times a day (QID) | RECTAL | Status: DC | PRN

## 2017-03-11 MED ORDER — OXYCODONE HCL 5 MG PO TABS
5.0000 mg | ORAL_TABLET | Freq: Once | ORAL | Status: AC
  Administered 2017-03-11: 10 mg via ORAL

## 2017-03-11 MED ORDER — LORAZEPAM 2 MG/ML IJ SOLN: 0.5000 mg | Freq: Once | INTRAMUSCULAR | Status: DC

## 2017-03-11 MED ORDER — PHENYLEPHRINE HCL 10 MG/ML IJ SOLN
INTRAMUSCULAR | Status: DC | PRN
  Administered 2017-03-11: 80 ug via INTRAVENOUS

## 2017-03-11 MED ORDER — LIDOCAINE HCL (CARDIAC) 20 MG/ML IV SOLN
INTRAVENOUS | Status: DC | PRN
  Administered 2017-03-11: 50 mg via INTRAVENOUS

## 2017-03-11 MED ORDER — GLYCOPYRROLATE 0.2 MG/ML IJ SOLN
INTRAMUSCULAR | Status: DC | PRN
  Administered 2017-03-11: 0.2 mg via INTRAVENOUS

## 2017-03-11 MED ORDER — METOCLOPRAMIDE HCL 10 MG PO TABS: 5.0000 mg | ORAL_TABLET | Freq: Three times a day (TID) | ORAL | Status: DC | PRN

## 2017-03-11 MED ORDER — FENTANYL CITRATE (PF) 100 MCG/2ML IJ SOLN
INTRAMUSCULAR | Status: DC | PRN
  Administered 2017-03-11: 50 ug via INTRAVENOUS

## 2017-03-11 MED ORDER — FENTANYL CITRATE (PF) 100 MCG/2ML IJ SOLN
INTRAMUSCULAR | Status: AC
  Administered 2017-03-11: 50 ug via INTRAVENOUS
  Filled 2017-03-11: qty 2

## 2017-03-11 MED ORDER — MIDAZOLAM HCL 5 MG/5ML IJ SOLN
INTRAMUSCULAR | Status: DC | PRN
  Administered 2017-03-11: 1 mg via INTRAVENOUS

## 2017-03-11 MED ORDER — ASPIRIN EC 81 MG PO TBEC
81.0000 mg | DELAYED_RELEASE_TABLET | Freq: Two times a day (BID) | ORAL | Status: DC
  Administered 2017-03-11 – 2017-03-15 (×8): 81 mg via ORAL
  Filled 2017-03-11 (×8): qty 1

## 2017-03-11 MED ORDER — METOCLOPRAMIDE HCL 5 MG/ML IJ SOLN: 5.0000 mg | Freq: Three times a day (TID) | INTRAMUSCULAR | Status: DC | PRN

## 2017-03-11 MED ORDER — ACETAMINOPHEN 325 MG PO TABS: 650.0000 mg | ORAL_TABLET | Freq: Four times a day (QID) | ORAL | Status: DC | PRN

## 2017-03-11 MED ORDER — PROPOFOL 10 MG/ML IV BOLUS
INTRAVENOUS | Status: AC
  Filled 2017-03-11: qty 20

## 2017-03-11 MED ORDER — ACETAMINOPHEN 325 MG PO TABS
ORAL_TABLET | ORAL | Status: AC
  Filled 2017-03-11: qty 2

## 2017-03-11 SURGICAL SUPPLY — 100 items
ADH SKN CLS APL DERMABOND .7 (GAUZE/BANDAGES/DRESSINGS)
BANDAGE ACE 4X5 VEL STRL LF (GAUZE/BANDAGES/DRESSINGS) ×1 IMPLANT
BANDAGE ACE 6X5 VEL STRL LF (GAUZE/BANDAGES/DRESSINGS) ×4 IMPLANT
BANDAGE ESMARK 6X9 LF (GAUZE/BANDAGES/DRESSINGS) ×2 IMPLANT
BIT DRILL 2.9 CANN QC NONSTRL (BIT) ×3 IMPLANT
BLADE CLIPPER SURG (BLADE) IMPLANT
BLADE SAW SGTL 18X1.27X75 (BLADE) ×1 IMPLANT
BLADE SAW SGTL 18X1.27X75MM (BLADE)
BNDG CMPR 9X6 STRL LF SNTH (GAUZE/BANDAGES/DRESSINGS) ×2
BNDG COHESIVE 4X5 TAN STRL (GAUZE/BANDAGES/DRESSINGS) ×4 IMPLANT
BNDG ESMARK 6X9 LF (GAUZE/BANDAGES/DRESSINGS) ×4
CHLORAPREP W/TINT 26ML (MISCELLANEOUS) ×5 IMPLANT
CLOSURE WOUND 1/2 X4 (GAUZE/BANDAGES/DRESSINGS)
COVER SURGICAL LIGHT HANDLE (MISCELLANEOUS) ×5 IMPLANT
CUFF TOURNIQUET SINGLE 34IN LL (TOURNIQUET CUFF) ×4 IMPLANT
DECANTER SPIKE VIAL GLASS SM (MISCELLANEOUS) ×1 IMPLANT
DERMABOND ADVANCED (GAUZE/BANDAGES/DRESSINGS)
DERMABOND ADVANCED .7 DNX12 (GAUZE/BANDAGES/DRESSINGS) ×2 IMPLANT
DRAPE C-ARM 35X43 STRL (DRAPES) ×3 IMPLANT
DRAPE C-ARM 42X72 X-RAY (DRAPES) ×5 IMPLANT
DRAPE C-ARMOR (DRAPES) ×4 IMPLANT
DRAPE HALF SHEET 40X57 (DRAPES) ×4 IMPLANT
DRAPE IMP U-DRAPE 54X76 (DRAPES) ×2 IMPLANT
DRAPE STERI IOBAN 125X83 (DRAPES) ×4 IMPLANT
DRAPE U-SHAPE 47X51 STRL (DRAPES) ×6 IMPLANT
DRSG ADAPTIC 3X8 NADH LF (GAUZE/BANDAGES/DRESSINGS) ×1 IMPLANT
DRSG AQUACEL AG ADV 3.5X10 (GAUZE/BANDAGES/DRESSINGS) ×1 IMPLANT
DRSG PAD ABDOMINAL 8X10 ST (GAUZE/BANDAGES/DRESSINGS) ×1 IMPLANT
DRSG VAC ATS LRG SENSATRAC (GAUZE/BANDAGES/DRESSINGS) ×3 IMPLANT
ELECT BLADE 4.0 EZ CLEAN MEGAD (MISCELLANEOUS)
ELECT REM PT RETURN 9FT ADLT (ELECTROSURGICAL) ×4
ELECTRODE BLDE 4.0 EZ CLN MEGD (MISCELLANEOUS) ×1 IMPLANT
ELECTRODE REM PT RTRN 9FT ADLT (ELECTROSURGICAL) ×3 IMPLANT
EVACUATOR 1/8 PVC DRAIN (DRAIN) IMPLANT
FACESHIELD WRAPAROUND (MASK) ×4 IMPLANT
FACESHIELD WRAPAROUND OR TEAM (MASK) ×1 IMPLANT
GAUZE SPONGE 4X4 12PLY STRL (GAUZE/BANDAGES/DRESSINGS) ×2 IMPLANT
GLOVE BIO SURGEON STRL SZ8.5 (GLOVE) ×12 IMPLANT
GLOVE BIOGEL PI IND STRL 8.5 (GLOVE) ×4 IMPLANT
GLOVE BIOGEL PI INDICATOR 8.5 (GLOVE) ×4
GOWN SPEC L3 XXLG W/TWL (GOWN DISPOSABLE) ×8 IMPLANT
GOWN STRL REUS W/ TWL LRG LVL3 (GOWN DISPOSABLE) ×4 IMPLANT
GOWN STRL REUS W/TWL 2XL LVL3 (GOWN DISPOSABLE) ×4 IMPLANT
GOWN STRL REUS W/TWL LRG LVL3 (GOWN DISPOSABLE) ×12 IMPLANT
HANDPIECE INTERPULSE COAX TIP (DISPOSABLE)
HOOD PEEL AWAY FACE SHEILD DIS (HOOD) ×2 IMPLANT
IMMOBILIZER KNEE 22 (SOFTGOODS) ×4 IMPLANT
K-WIRE ACE 1.6X6 (WIRE) ×8
KIT BASIN OR (CUSTOM PROCEDURE TRAY) ×4 IMPLANT
KIT ROOM TURNOVER OR (KITS) ×4 IMPLANT
KWIRE ACE 1.6X6 (WIRE) ×2 IMPLANT
MANIFOLD NEPTUNE II (INSTRUMENTS) ×2 IMPLANT
MARKER SKIN DUAL TIP RULER LAB (MISCELLANEOUS) ×4 IMPLANT
NDL 1/2 CIR MAYO (NEEDLE) ×1 IMPLANT
NDL SPNL 18GX3.5 QUINCKE PK (NEEDLE) ×1 IMPLANT
NEEDLE 1/2 CIR MAYO (NEEDLE) ×4 IMPLANT
NEEDLE SPNL 18GX3.5 QUINCKE PK (NEEDLE) IMPLANT
NS IRRIG 1000ML POUR BTL (IV SOLUTION) ×5 IMPLANT
PACK ORTHO EXTREMITY (CUSTOM PROCEDURE TRAY) ×4 IMPLANT
PACK TOTAL JOINT (CUSTOM PROCEDURE TRAY) ×1 IMPLANT
PACK UNIVERSAL I (CUSTOM PROCEDURE TRAY) ×1 IMPLANT
PAD ARMBOARD 7.5X6 YLW CONV (MISCELLANEOUS) ×6 IMPLANT
PAD CAST 4YDX4 CTTN HI CHSV (CAST SUPPLIES) ×2 IMPLANT
PADDING CAST ABS 6INX4YD NS (CAST SUPPLIES)
PADDING CAST ABS COTTON 6X4 NS (CAST SUPPLIES) ×2 IMPLANT
PADDING CAST COTTON 4X4 STRL (CAST SUPPLIES)
PREVENA INCISION MGT 90 150 (MISCELLANEOUS) ×3 IMPLANT
RETRIEVER SUT HEWSON (MISCELLANEOUS) ×7 IMPLANT
SCREW ACE CAN 4.0 34M (Screw) ×6 IMPLANT
SEALER BIPOLAR AQUA 6.0 (INSTRUMENTS) ×3 IMPLANT
SET HNDPC FAN SPRY TIP SCT (DISPOSABLE) ×1 IMPLANT
SOL PREP POV-IOD 4OZ 10% (MISCELLANEOUS) ×1 IMPLANT
SPONGE LAP 4X18 X RAY DECT (DISPOSABLE) ×4 IMPLANT
STOCKINETTE IMPERVIOUS 9X36 MD (GAUZE/BANDAGES/DRESSINGS) ×4 IMPLANT
STRIP CLOSURE SKIN 1/2X4 (GAUZE/BANDAGES/DRESSINGS) ×1 IMPLANT
SUCTION FRAZIER HANDLE 10FR (MISCELLANEOUS) ×2
SUCTION TUBE FRAZIER 10FR DISP (MISCELLANEOUS) ×2 IMPLANT
SUT ETHIBOND NAB CT1 #1 30IN (SUTURE) ×2 IMPLANT
SUT FIBERWIRE #2 38 T-5 BLUE (SUTURE) ×8
SUT MNCRL AB 3-0 PS2 18 (SUTURE) ×4 IMPLANT
SUT MNCRL AB 4-0 PS2 18 (SUTURE) ×4 IMPLANT
SUT MON AB 2-0 CT1 27 (SUTURE) ×4 IMPLANT
SUT MON AB 2-0 CT1 36 (SUTURE) ×4 IMPLANT
SUT VIC AB 1 CT1 27 (SUTURE) ×12
SUT VIC AB 1 CT1 27XBRD ANBCTR (SUTURE) ×2 IMPLANT
SUT VIC AB 1 CT1 27XBRD ANTBC (SUTURE) ×4 IMPLANT
SUT VIC AB 2-0 CT1 27 (SUTURE) ×4
SUT VIC AB 2-0 CT1 TAPERPNT 27 (SUTURE) ×2 IMPLANT
SUT VIC AB 3-0 PS2 18 (SUTURE)
SUT VIC AB 3-0 PS2 18XBRD (SUTURE) IMPLANT
SUT VLOC 180 0 24IN GS25 (SUTURE) ×1 IMPLANT
SUTURE FIBERWR #2 38 T-5 BLUE (SUTURE) ×3 IMPLANT
SYR 50ML LL SCALE MARK (SYRINGE) ×1 IMPLANT
TOWEL OR 17X24 6PK STRL BLUE (TOWEL DISPOSABLE) ×4 IMPLANT
TOWEL OR 17X26 10 PK STRL BLUE (TOWEL DISPOSABLE) ×12 IMPLANT
TRAY FOLEY CATH SILVER 16FR (SET/KITS/TRAYS/PACK) IMPLANT
TUBE CONNECTING 12'X1/4 (SUCTIONS) ×1
TUBE CONNECTING 12X1/4 (SUCTIONS) ×3 IMPLANT
WATER STERILE IRR 1000ML POUR (IV SOLUTION) ×3 IMPLANT
YANKAUER SUCT BULB TIP NO VENT (SUCTIONS) ×4 IMPLANT

## 2017-03-11 NOTE — Progress Notes (Signed)
[facility]                                                                              Patient Demographics  Sherry Cox, is a 56 y.o. female, DOB - Mar 27, 1961, CVE:[id number]  Admit date - 03/10/2017   Admitting Physician Jani Gravel, MD  Outpatient Primary MD for the patient is Patient, No Pcp Per  Outpatient specialists:   LOS - 1  days   Medical records reviewed and are as summarized below:    Chief Complaint  Patient presents with  . Fall  . Knee Injury       Brief summary   Patient is a 56 year old female with CAD status post CABG, carotid stenosis, ESRD on HD, TTS, presented with a mechanical fall, right knee pain. CT knee showed horizontal patellar fracture with 3.4 cm of distraction, tiny avulsion fracture right of the medial aspect of patella. Orthopedics was consulted, planned for surgery 9/3  Assessment & Plan    Principal Problem:  acute right Patellar fracture status post mechanical fall - knee immobilizer placed - NPO for patellar surgery today - Pain control, PT OT after surgery  Active Problems:   ESRD on dialysis (HCC)TTS - Last dialysis on Saturday -  notified renal, will need hemodialysis in a.m. - continue Renvela, sodium bicarbonate    CAD (coronary artery disease) - currently symptomatic,continue aspirin, statin    Hyponatremia - mild, resolved  Anemia of chronic disease sec to ESRD - currently stable, may need transfusion after the surgery  Hypothyroidism - Continue Synthroid  Anxiety - Continue Klonopin, sertraline per outpatient home does  Code Status: full CODE STATUS DVT Prophylaxis:  Awaiting surgery Family Communication: Discussed in detail with the patient, all imaging results, lab results explained to the patient    Disposition Plan:   Time Spent in minutes   25 minutes  Procedures:    Consultants:   Renal  Ortho  Antimicrobials:      Medications  Scheduled Meds: . amLODipine  10  mg Oral QHS  . aspirin EC  81 mg Oral Daily  . atorvastatin  80 mg Oral q1800  . b complex-vitamin c-folic acid  1 tablet Oral QHS  . chlorhexidine  60 mL Topical Once  . folic acid  1 mg Oral Daily  . HYDROcodone-acetaminophen  1 tablet Oral TID  . levothyroxine  175 mcg Oral QAC breakfast  . metoprolol tartrate  50 mg Oral BID  . povidone-iodine  2 application Topical Once  . sertraline  100 mg Oral Daily  . sevelamer carbonate  4,000 mg Oral TID WC  . sodium bicarbonate  650 mg Oral BID  . sodium chloride flush  3 mL Intravenous Q12H   Continuous Infusions: . sodium chloride    . clindamycin (CLEOCIN) IV     PRN Meds:.sodium chloride, acetaminophen **OR** acetaminophen, albuterol, clonazePAM, diphenhydrAMINE, HYDROmorphone (DILAUDID) injection, nitroGLYCERIN, ondansetron (ZOFRAN) IV, sevelamer carbonate, sodium chloride, sodium chloride flush   Antibiotics   Anti-infectives    Start     Dose/Rate Route Frequency Ordered Stop   03/11/17 0800  clindamycin (CLEOCIN) IVPB 900 mg  900 mg 100 mL/hr over 30 Minutes Intravenous To [facility] Surgical 03/10/17 2100 03/12/17 0800        Subjective:   Sherry Cox was seen and examined today. Somewhat anxious for impending surgery, pain currently controlled. Patient denies dizziness, chest pain, shortness of breath, abdominal pain, N/V/D/C, new weakness, numbess, tingling. No acute events overnight.    Objective:   Vitals:   03/10/17 2000 03/10/17 2048 03/11/17 0426 03/11/17 0847  BP: (!) 148/58 (!) 169/55 (!) 143/44 (!) 126/57  Pulse: 65 71 67 (!) 51  Resp: 13 17 18    Temp:  98.1 F (36.7 C) 97.9 F (36.6 C)   TempSrc:  Oral Oral   SpO2:  100% 97%   Weight:   86.8 kg (191 lb 5.8 oz)   Height:  4\' 10"  (1.473 m)      Intake/Output Summary (Last 24 hours) at 03/11/17 1017 Last data filed at 03/11/17 0900  Gross per 24 hour  Intake              100 ml  Output                0 ml  Net              100 ml      Wt Readings from Last 3 Encounters:  03/11/17 86.8 kg (191 lb 5.8 oz)  12/10/16 88.5 kg (195 lb)  12/23/15 95.7 kg (211 lb)     Exam  General: Alert and oriented x 3, NAD  Eyes:   HEENT:  Atraumatic, normocephalic  Cardiovascular: S1 S2 auscultated, no rubs, murmurs or gallops. Regular rate and rhythm.  Respiratory: Clear to auscultation bilaterally, no wheezing, rales or rhonchi  Gastrointestinal: Soft, nontender, nondistended, + bowel sounds  Ext: no pedal edema LLE, right knee in immobilizer  Neuro: no new deficits  Musculoskeletal: right knee in the immobilizer  Skin: No rashes  Psych: Normal affect and demeanor, alert and oriented x3    Data Reviewed:  I have personally reviewed following labs and imaging studies  Micro Results Recent Results (from the past 240 hour(s))  Surgical pcr screen     Status: None   Collection Time: 03/10/17 10:37 PM  Result Value Ref Range Status   MRSA, PCR NEGATIVE NEGATIVE Final   Staphylococcus aureus NEGATIVE NEGATIVE Final    Comment: (NOTE) The Xpert SA Assay (FDA approved for NASAL specimens in patients 55 years of age and older), is one component of a comprehensive surveillance program. It is not intended to diagnose infection nor to guide or monitor treatment.     Radiology Reports Ct Knee Right Wo Contrast  Result Date: 03/10/2017 CLINICAL DATA:  Patient slipped and fell on within floor home today. Right knee pain. EXAM: CT OF THE  KNEE WITHOUT CONTRAST TECHNIQUE: Multidetector CT imaging of the knee was performed according to the standard protocol. Multiplanar CT image reconstructions were also generated. COMPARISON:  None. FINDINGS: Bones/Joint/Cartilage An acute, closed, horizontal fracture through the body of the patella with 3.4 cm of distraction is noted. Tiny avulsion off the medial patella may reflect a tiny avulsion involving the medial patellofemoral ligament. There is a shallow impaction fracture of the  anterior lateral femoral condyle, series 68, image 81 and series 3, image 86. Ligaments Suboptimally assessed by CT. Muscles and Tendons No intramuscular hemorrhage or atrophy. Soft tissues Prepatellar soft tissue contusion and swelling is noted with interposed 3.7 x 5.7 x 3 cm hematoma between the patellar fragments.  Small joint effusion. IMPRESSION: 1. Acute, closed, horizontal fracture through the body of the patella with 3.4 cm of distraction noted. Interposed hematoma with overlying prepatellar soft tissue swelling. 2. Tiny avulsion fracture right off the medial aspect of the patella may reflect a small avulsion involving the medial patellofemoral ligament. 3. Subtle shallow impaction fracture of the anterior aspect of the lateral femoral condyle. Electronically Signed   By: Ashley Royalty M.D.   On: 03/10/2017 19:28   Dg Knee Complete 4 Views Right  Result Date: 03/10/2017 CLINICAL DATA:  Fall onto knee EXAM: RIGHT KNEE - COMPLETE 4+ VIEW COMPARISON:  None. FINDINGS: There is a fracture horizontally through the body of the patella with distraction of the fracture fragments by 3 cm. Prepatellar soft tissue swelling. No fracture the tibia or femur. IMPRESSION: Horizontal fracture through the midbody of the patella with 3 cm of distraction Electronically Signed   By: Suzy Bouchard M.D.   On: 03/10/2017 17:33    Lab Data:  CBC:  Recent Labs Lab 03/10/17 1847 03/11/17 0338  WBC 9.6 7.5  NEUTROABS  --  5.7  HGB 9.9* 8.8*  HCT 31.7* 28.2*  MCV 96.4 97.2  PLT 219 480   Basic Metabolic Panel:  Recent Labs Lab 03/10/17 1847 03/11/17 0338  NA 134* 136  K 4.6 5.2*  CL 98* 98*  CO2 25 27  GLUCOSE 110* 74  BUN 16 18  CREATININE 5.33* 6.24*  CALCIUM 8.5* 8.3*   GFR: Estimated Creatinine Clearance: 9.4 mL/min (A) (by C-G formula based on SCr of 6.24 mg/dL (H)). Liver Function Tests:  Recent Labs Lab 03/10/17 1847 03/11/17 0338  AST 18 29  ALT 16 15  ALKPHOS 69 63  BILITOT 0.6 1.0   PROT 6.1* 5.2*  ALBUMIN 3.3* 3.0*   No results for input(s): LIPASE, AMYLASE in the last 168 hours. No results for input(s): AMMONIA in the last 168 hours. Coagulation Profile:  Recent Labs Lab 03/10/17 1847  INR 0.99   Cardiac Enzymes: No results for input(s): CKTOTAL, CKMB, CKMBINDEX, TROPONINI in the last 168 hours. BNP (last 3 results) No results for input(s): PROBNP in the last 8760 hours. HbA1C: No results for input(s): HGBA1C in the last 72 hours. CBG: No results for input(s): GLUCAP in the last 168 hours. Lipid Profile: No results for input(s): CHOL, HDL, LDLCALC, TRIG, CHOLHDL, LDLDIRECT in the last 72 hours. Thyroid Function Tests: No results for input(s): TSH, T4TOTAL, FREET4, T3FREE, THYROIDAB in the last 72 hours. Anemia Panel: No results for input(s): VITAMINB12, FOLATE, FERRITIN, TIBC, IRON, RETICCTPCT in the last 72 hours. Urine analysis: No results found for: COLORURINE, APPEARANCEUR, LABSPEC, PHURINE, GLUCOSEU, HGBUR, BILIRUBINUR, KETONESUR, PROTEINUR, UROBILINOGEN, NITRITE, LEUKOCYTESUR   Clifton Safley M.D. [facility] 03/11/2017, 10:17 AM  Pager: [phone] Between 7am to 7pm - call Pager - [phone]  After 7pm go to [email or web address] - password [email or web address]  Call night coverage person covering after 7pm

## 2017-03-11 NOTE — Consult Note (Signed)
ORTHOPAEDIC CONSULTATION  REQUESTING PHYSICIAN: Mendel Corning, MD  PCP:  Patient, No Pcp Per  Chief Complaint: Right knee pain.  HPI: Sherry Cox is a 56 y.o. female with multiple medical problems including hemodialysis Tuesday, Thursday, and Saturday. She tripped and fell at home last night. She had right knee pain and inability to weight-bear. She was brought to the emergency Department at [facility], where x-rays revealed a displaced transverse right patellar fracture. I was asked to assume care by my partner, Dr. Rolena Infante. She was admitted to the hospitalist service for perioperative risk stratification and medical optimization. She denies additional injuries.  Past Medical History:  Diagnosis Date  . Ankle injury   . Anxiety   . Anxiety disorder   . CAD (coronary artery disease)    Total RCA, stress echo [facility] 11/2008-no ischemia, EF 55%; h/o CABG  . Carotid artery stenosis    12/10/2008 doppler 73-53% RICA, 2-99% LICA/see evaluation by Dr. Oneida Alar 2011  . Ejection fraction    EF 60-65%, echo, June, 2009  . ESRD on dialysis [facility])    Transplant consideration  . Hemochromatosis   . Hx of CABG    Off pump LIMA to LAD  05/2006  . Hx of tobacco use, presenting hazards to health 04/26/2012  . Hyperlipidemia   . Irregular heart beat   . Murmur    October, 2012  . Neuropathy   . Overweight(278.02)   . Tobacco abuse    Past Surgical History:  Procedure Laterality Date  . AV FISTULA PLACEMENT     Has HD on Tues,Thurs, and Saturday at [facility]  . CARDIAC VALVE SURGERY  2007  . CAROTID ANGIOGRAM Bilateral 03/06/2013   Procedure: CAROTID ANGIOGRAM;  Surgeon: Elam Dutch, MD;  Location: [facility] CATH LAB;  Service: Cardiovascular;  Laterality: Bilateral;  . CHOLECYSTECTOMY  1995   Gall Bladder  . CORONARY ARTERY BYPASS GRAFT  05/2006   Off pump LIMA to LAD  . INNER EAR SURGERY    . Plastic Surgery on leg     Social History   Social History  . Marital status:  Married    Spouse name: N/A  . Number of children: N/A  . Years of education: N/A   Occupational History  . DISABLED    Social History Main Topics  . Smoking status: Current Every Day Smoker    Packs/day: 0.30    Years: 30.00    Types: Cigarettes    Start date: 02/02/1972  . Smokeless tobacco: Never Used  . Alcohol use No  . Drug use: No  . Sexual activity: Yes   Other Topics Concern  . None   Social History Narrative  . None   Family History  Problem Relation Age of Onset  . Depression Sister   . Dementia Sister   . OCD Sister   . Diabetes Sister   . Hypertension Sister   . Heart attack Sister   . Bipolar disorder Brother   . Drug abuse Brother   . Diabetes Brother   . Hypertension Brother   . Heart disease Brother   . Bipolar disorder Other   . ADD / ADHD Other   . Seizures Other   . Bipolar disorder Other   . Alcohol abuse Other   . Dementia Mother   . Heart disease Mother   . Hypertension Mother   . Heart attack Mother   . Alcohol abuse Father   . Heart disease Father  Heart Disease before age 42  . Hypertension Father   . Heart attack Father   . Dementia Maternal Aunt   . OCD Sister   . Heart disease Sister        Heart Disease before age 39  . Paranoid behavior Neg Hx   . Schizophrenia Neg Hx   . Sexual abuse Neg Hx   . Physical abuse Neg Hx    Allergies  Allergen Reactions  . Augmentin [Amoxicillin-Pot Clavulanate] Nausea And Vomiting    Vomiting   Prior to Admission medications   Medication Sig Start Date End Date Taking? Authorizing Provider  albuterol (PROAIR HFA) 108 (90 BASE) MCG/ACT inhaler Inhale 2 puffs into the lungs every 4 (four) hours as needed. For wheezing   Yes [provider]  amLODipine (NORVASC) 10 MG tablet Take 1 tablet (10 mg total) by mouth daily. Patient taking differently: Take 10 mg by mouth at bedtime.  12/10/16  Yes BranchAlphonse Guild, MD  Artificial Tear Ointment (DRY EYES OP) Place 1 drop into both  eyes daily.   Yes [provider]  aspirin EC 81 MG tablet Take 1 tablet (81 mg total) by mouth daily. 12/11/13  Yes Carlena Bjornstad, MD  clonazePAM (KLONOPIN) 0.5 MG tablet Take 1 tablet (0.5 mg total) by mouth 4 (four) times daily. Patient taking differently: Take 0.5 mg by mouth as needed.  01/11/17  Yes Cloria Spring, MD  folic acid (FOLVITE) 1 MG tablet Take 1 mg by mouth daily.   Yes [provider]  folic acid-vitamin b complex-vitamin c-selenium-zinc (DIALYVITE) 3 MG TABS Take 1 tablet by mouth daily.     Yes [provider]  HYDROcodone-acetaminophen (NORCO/VICODIN) 5-325 MG tablet Take 1 tablet by mouth 3 (three) times daily. 12/20/15  Yes [provider]  ibuprofen (ADVIL,MOTRIN) 200 MG tablet Take 400 mg by mouth every 6 (six) hours as needed for mild pain.   Yes [provider]  levothyroxine (SYNTHROID, LEVOTHROID) 175 MCG tablet Take 175 mcg by mouth Daily.  03/21/11  Yes [provider]  LIPITOR 80 MG tablet TAKE 1 BY MOUTH DAILY      * 80MG  TOTAL *             * STOP SIMVASTATIN * 11/22/16  Yes Branch, Alphonse Guild, MD  LOPRESSOR 50 MG tablet TAKE 1 BY MOUTH TWO TIMES  DAILY ON MONDAY, WEDNESDAY AND FRIDAY AND 1 ON ALL    OTHER DAYS Patient taking differently: TAKE 50 mg daily 11/29/16  Yes Branch, Alphonse Guild, MD  nitroGLYCERIN (NITROSTAT) 0.4 MG SL tablet Place 1 tablet (0.4 mg total) under the tongue every 5 (five) minutes as needed for chest pain. 12/10/16  Yes BranchAlphonse Guild, MD  OXYGEN Inhale into the lungs. On 2   Yes [provider]  sertraline (ZOLOFT) 100 MG tablet Take 1 tablet (100 mg total) by mouth daily. 01/11/17  Yes Cloria Spring, MD  sevelamer (RENVELA) 800 MG tablet Take 1,600-4,000 mg by mouth as directed. Take 5 tablets (4000mg ) by mouth with meals and 1600 tablets by mouth with snacks.   Yes [provider]  sodium bicarbonate 650 MG tablet Take 650 mg by mouth 2 (two) times daily.    Yes [provider]  sodium chloride (OCEAN) 0.65 % SOLN nasal spray Place 1 spray into both nostrils as needed for congestion.   Yes [provider]  zolpidem (AMBIEN) 5 MG tablet Take 1 tablet (5  mg total) by mouth at bedtime as needed. For sleep Patient not taking: Reported on 03/10/2017 01/11/17   Cloria Spring, MD   Ct Knee Right Wo Contrast  Result Date: 03/10/2017 CLINICAL DATA:  Patient slipped and fell on within floor home today. Right knee pain. EXAM: CT OF THE  KNEE WITHOUT CONTRAST TECHNIQUE: Multidetector CT imaging of the knee was performed according to the standard protocol. Multiplanar CT image reconstructions were also generated. COMPARISON:  None. FINDINGS: Bones/Joint/Cartilage An acute, closed, horizontal fracture through the body of the patella with 3.4 cm of distraction is noted. Tiny avulsion off the medial patella may reflect a tiny avulsion involving the medial patellofemoral ligament. There is a shallow impaction fracture of the anterior lateral femoral condyle, series 68, image 81 and series 3, image 86. Ligaments Suboptimally assessed by CT. Muscles and Tendons No intramuscular hemorrhage or atrophy. Soft tissues Prepatellar soft tissue contusion and swelling is noted with interposed 3.7 x 5.7 x 3 cm hematoma between the patellar fragments. Small joint effusion. IMPRESSION: 1. Acute, closed, horizontal fracture through the body of the patella with 3.4 cm of distraction noted. Interposed hematoma with overlying prepatellar soft tissue swelling. 2. Tiny avulsion fracture right off the medial aspect of the patella may reflect a small avulsion involving the medial patellofemoral ligament. 3. Subtle shallow impaction fracture of the anterior aspect of the lateral femoral condyle. Electronically Signed   By: Ashley Royalty M.D.   On: 03/10/2017 19:28   Dg Knee Complete 4 Views Right  Result Date: 03/10/2017 CLINICAL DATA:  Fall onto knee EXAM: RIGHT KNEE - COMPLETE 4+ VIEW COMPARISON:   None. FINDINGS: There is a fracture horizontally through the body of the patella with distraction of the fracture fragments by 3 cm. Prepatellar soft tissue swelling. No fracture the tibia or femur. IMPRESSION: Horizontal fracture through the midbody of the patella with 3 cm of distraction Electronically Signed   By: Suzy Bouchard M.D.   On: 03/10/2017 17:33    Positive ROS: All other systems have been reviewed and were otherwise negative with the exception of those mentioned in the HPI and as above.  Physical Exam: General: Alert, no acute distress Cardiovascular: No pedal edema Respiratory: No cyanosis, no use of accessory musculature GI: No organomegaly, abdomen is soft and non-tender Skin: No lesions in the area of chief complaint Neurologic: Sensation intact distally Psychiatric: Patient is competent for consent with normal mood and affect Lymphatic: No axillary or cervical lymphadenopathy  MUSCULOSKELETAL: Examination of the right knee reveals significant swelling and ecchymosis. She has a superficial abrasion over the lateral aspect of the patella. She cannot perform a straight leg raise. She is neurovascularly intact distally.  Assessment: 1. Displaced right patella fracture. 2. Multiple medical problems.  Plan: I discussed the findings with the patient. She has a displaced right patella fracture. I recommended open reduction internal fixation in order to restore her extensor mechanism anatomy. We discussed the risks, benefits, and alternatives. Please see statement of risk. She is at an increased risk of nonunion and wound infection due to her dialysis treatments. We will plan for surgery today.    Luqman Perrelli, [name], MD Cell [phone]    03/11/2017 1:19 PM

## 2017-03-11 NOTE — Progress Notes (Signed)
Betadine swab to nares as ordered.

## 2017-03-11 NOTE — Anesthesia Procedure Notes (Signed)
Spinal  Patient location during procedure: OR Start time: 03/11/2017 1:32 PM End time: 03/11/2017 1:36 PM Staffing Anesthesiologist: Renold Don E Performed: anesthesiologist  Preanesthetic Checklist Completed: patient identified, surgical consent, pre-op evaluation, timeout performed, IV checked, risks and benefits discussed and monitors and equipment checked Spinal Block Patient position: sitting Prep: DuraPrep Patient monitoring: heart rate, cardiac monitor, continuous pulse ox and blood pressure Approach: midline Location: L3-4 Injection technique: single-shot Needle Needle type: Pencan  Needle gauge: 24 G Additional Notes Functioning IV was confirmed and monitors were applied. Sterile prep and drape, including hand hygiene, mask, and sterile gloves were used. The patient was positioned and the spine was prepped. The skin was anesthetized with lidocaine. Free flow of clear CSF was obtained prior to injecting local anesthetic into the CSF. The spinal needle aspirated freely following injection. The needle was carefully withdrawn. The patient tolerated the procedure well. Consent was obtained prior to the procedure with all questions answered and concerns addressed. Risks including, but not limited to, bleeding, infection, nerve damage, paralysis, failed block, inadequate analgesia, allergic reaction, high spinal, itching, and headache were discussed and the patient wished to proceed.  Renold Don, MD

## 2017-03-11 NOTE — Transfer of Care (Signed)
Immediate Anesthesia Transfer of Care Note  Patient: Sherry Cox  Procedure(s) Performed: Procedure(s): OPEN REDUCTION INTERNAL (ORIF) FIXATION PATELLA (Right)  Patient Location: PACU  Anesthesia Type:MAC  Level of Consciousness: awake, alert  and oriented  Airway & Oxygen Therapy: Patient connected to nasal cannula oxygen  Post-op Assessment: Post -op Vital signs reviewed and stable  Post vital signs: stable  Last Vitals:  Vitals:   03/11/17 0426 03/11/17 0847  BP: (!) 143/44 (!) 126/57  Pulse: 67 (!) 51  Resp: 18   Temp: 36.6 C   SpO2: 97%     Last Pain:  Vitals:   03/11/17 0920  TempSrc:   PainSc: Asleep      Patients Stated Pain Goal: 2 (33/43/56 8616)  Complications: No apparent anesthesia complications

## 2017-03-11 NOTE — Anesthesia Preprocedure Evaluation (Addendum)
Anesthesia Evaluation  Patient identified by MRN, date of birth, ID band Patient awake    Reviewed: Allergy & Precautions, NPO status , Patient's Chart, lab work & pertinent test results  Airway Mallampati: II  TM Distance: >3 FB Neck ROM: Full    Dental  (+) Dental Advisory Given, Teeth Intact   Pulmonary Current Smoker,     + wheezing      Cardiovascular + CAD, + CABG and + Peripheral Vascular Disease  Normal cardiovascular exam Rhythm:Regular Rate:Normal  Carotid US 2016 - b/l 40-59% ICAS; Prolonged QT on EKG   Neuro/Psych PSYCHIATRIC DISORDERS Anxiety negative neurological ROS     GI/Hepatic negative GI ROS, Neg liver ROS,   Endo/Other  Hypothyroidism Obesity  Renal/GU ESRF and DialysisRenal disease  negative genitourinary   Musculoskeletal negative musculoskeletal ROS (+)   Abdominal   Peds  Hematology  (+) anemia ,   Anesthesia Other Findings   Reproductive/Obstetrics                          Anesthesia Physical Anesthesia Plan  ASA: III  Anesthesia Plan: Spinal   Post-op Pain Management:    Induction: Intravenous  PONV Risk Score and Plan: 3 and Ondansetron, Dexamethasone, Midazolam, Propofol infusion and Treatment may vary due to age or medical condition  Airway Management Planned: Nasal Cannula and Natural Airway  Additional Equipment:   Intra-op Plan:   Post-operative Plan:   Informed Consent: I have reviewed the patients History and Physical, chart, labs and discussed the procedure including the risks, benefits and alternatives for the proposed anesthesia with the patient or authorized representative who has indicated his/her understanding and acceptance.   Dental advisory given  Plan Discussed with: CRNA  Anesthesia Plan Comments:         Anesthesia Quick Evaluation

## 2017-03-11 NOTE — Discharge Instructions (Signed)
°  Dr. Rod Can Adult Hip & Knee Specialist [facility] [address] [place], [place] [address] [phone]   POSTOPERATIVE DIRECTIONS  Weight bearing status Weight bearing as tolerated with knee immobilizer and assist device (walker, cane, etc) as directed, use it as long as suggested by your surgeon or therapist, typically at least 6 weeks.  Wear knee immobilizer at all times Perform Quad sets in the knee immobilizer. Keep VAC dressing clean and dry.  Charge VAC unit nightly.

## 2017-03-11 NOTE — Progress Notes (Signed)
Pt. Back from surgery, vs stable. Pain medicine given. Pt. Resting comfortably.

## 2017-03-11 NOTE — Brief Op Note (Signed)
03/11/2017  2:51 PM  PATIENT:  Sherry Cox  56 y.o. female  PRE-OPERATIVE DIAGNOSIS:  Right Patella Fracture  POST-OPERATIVE DIAGNOSIS:  Right Patella Neck Fracture  PROCEDURE:  Procedure(s): OPEN REDUCTION INTERNAL (ORIF) FIXATION PATELLA (Right)  SURGEON:  Surgeon(s) and Role:    * Cande Mastropietro, Aaron Edelman, MD - Primary  PHYSICIAN ASSISTANT: brad dixon, pa-c  ASSISTANTS: staff   ANESTHESIA:   spinal  EBL:  Total I/O In: 500 [I.V.:500] Out: 100 [Blood:100]  BLOOD ADMINISTERED:none  DRAINS: Prevena VAC  LOCAL MEDICATIONS USED:  NONE  SPECIMEN:  No Specimen  DISPOSITION OF SPECIMEN:  N/A  COUNTS:  YES  TOURNIQUET:   Total Tourniquet Time Documented: Thigh (Right) - 34 minutes Total: Thigh (Right) - 34 minutes   DICTATION: .Other Dictation: Dictation Number [id number]  PLAN OF CARE: Admit to inpatient   PATIENT DISPOSITION:  PACU - hemodynamically stable.   Delay start of Pharmacological VTE agent (>24hrs) due to surgical blood loss or risk of bleeding: no

## 2017-03-11 NOTE — Progress Notes (Signed)
Placed on tele to monitor  

## 2017-03-11 NOTE — Progress Notes (Signed)
Sherry Cox, is a 56 y.o. female  MRN: [id number]   DOB - [birth year]  Admit Date - 03/10/2017  Outpatient Primary MD for the patient is Patient, No Pcp Per  Referring MD/NP/PA: Pattricia Boss  Outpatient Specialists:  Carlyle Dolly (cardiology) Fran Lowes (nephrology)   Patient coming from: home     Chief Complaint  Patient presents with  . Fall  . Knee Injury      HPI:    Sherry Cox  is a 56 y.o. female, CAD s/p CABG, carotid stenosis,  w ESRD on HD (T, T, S), no hx of osteoporosis, apparently c/o Fall onto a divider for a screen door on the ground where she hurt her knee cap.  No syncope. No chest pain.  Pt presented to ED for evaluation of right knee pain.   In ED, Xray=> IMPRESSION: Horizontal fracture through the midbody of the patella with 3 cm of distraction Orthopedics was consulted by ED, and will be by to evaluate the patient, appreciate their input.   Pt states that she had her routine dialysis yesterday and will not need dialysis til Tuesday.  She had a full session.      Review of systems:    In addition to the HPI above,   No Fever-chills, No Headache, No changes with Vision or hearing, No problems swallowing food or Liquids, No Chest pain, Cough or Shortness of Breath, No Abdominal pain, No Nausea or Vommitting, Bowel movements are regular, No Blood in stool or Urine, No dysuria, No new skin rashes or bruises, Pt is typically able to climb a flight of stairs without difficulty, stable on her HD,  Always completes per patient and no difficulty with hypotension No new weakness, tingling, numbness in any extremity, No recent weight gain or loss, No polyuria, polydypsia or polyphagia, No significant Mental Stressors.  A full 10 point Review of Systems was done, except as stated above, all other Review of Systems were negative.   With Past History of the following :        Past Medical History:  Diagnosis Date   . Ankle injury   . Anxiety   . Anxiety disorder   . CAD (coronary artery disease)    Total RCA, stress echo [facility] 11/2008-no ischemia, EF 55%; h/o CABG  . Carotid artery stenosis    12/10/2008 doppler 76-54% RICA, 6-50% LICA/see evaluation by Dr. Oneida Alar 2011  . Ejection fraction    EF 60-65%, echo, June, 2009  . ESRD on dialysis [facility])    Transplant consideration  . Hemochromatosis   . Hx of CABG    Off pump LIMA to LAD  05/2006  . Hx of tobacco use, presenting hazards to health 04/26/2012  . Hyperlipidemia   . Irregular heart beat   . Murmur    October, 2012  . Neuropathy   . Overweight(278.02)   . Tobacco abuse    Patient is alert and oriented, placed on tele.  Oriented to room.  Will continue to monitor.

## 2017-03-11 NOTE — Anesthesia Postprocedure Evaluation (Signed)
Anesthesia Post Note  Patient: Sherry Cox  Procedure(s) Performed: Procedure(s) (LRB): OPEN REDUCTION INTERNAL (ORIF) FIXATION PATELLA (Right)     Patient location during evaluation: PACU Anesthesia Type: Spinal Level of consciousness: awake and alert Pain management: pain level controlled Vital Signs Assessment: post-procedure vital signs reviewed and stable Respiratory status: spontaneous breathing and respiratory function stable Cardiovascular status: blood pressure returned to baseline and stable Postop Assessment: spinal receding Anesthetic complications: no    Last Vitals:  Vitals:   03/11/17 1600 03/11/17 1615  BP:    Pulse: 64   Resp: 13   Temp:  36.5 C  SpO2: 100%     Last Pain:  Vitals:   03/11/17 1615  TempSrc:   PainSc: [place]

## 2017-03-12 ENCOUNTER — Encounter (HOSPITAL_COMMUNITY): Payer: Self-pay | Admitting: Orthopedic Surgery

## 2017-03-12 LAB — BASIC METABOLIC PANEL
Anion gap: 12 (ref 5–15)
BUN: 28 mg/dL — AB (ref 6–20)
CALCIUM: 8.5 mg/dL — AB (ref 8.9–10.3)
CO2: 25 mmol/L (ref 22–32)
CREATININE: 7.53 mg/dL — AB (ref 0.44–1.00)
Chloride: 95 mmol/L — ABNORMAL LOW (ref 101–111)
GFR calc Af Amer: 6 mL/min — ABNORMAL LOW (ref >60)
GFR, EST NON AFRICAN AMERICAN: 5 mL/min — AB (ref >60)
GLUCOSE: 94 mg/dL (ref 65–99)
Potassium: 5.5 mmol/L — ABNORMAL HIGH (ref 3.5–5.1)
SODIUM: 132 mmol/L — AB (ref 135–145)

## 2017-03-12 LAB — CBC
HCT: 28.4 % — ABNORMAL LOW (ref 36.0–46.0)
Hemoglobin: 9 g/dL — ABNORMAL LOW (ref 12.0–15.0)
MCH: 30.6 pg (ref 26.0–34.0)
MCHC: 31.7 g/dL (ref 30.0–36.0)
MCV: 96.6 fL (ref 78.0–100.0)
PLATELETS: 235 10*3/uL (ref 150–400)
RBC: 2.94 MIL/uL — AB (ref 3.87–5.11)
RDW: 18 % — AB (ref 11.5–15.5)
WBC: 10.4 10*3/uL (ref 4.0–10.5)

## 2017-03-12 LAB — HIV ANTIBODY (ROUTINE TESTING W REFLEX): HIV SCREEN 4TH GENERATION: NONREACTIVE

## 2017-03-12 MED ORDER — DOXERCALCIFEROL 4 MCG/2ML IV SOLN
1.0000 ug | INTRAVENOUS | Status: DC
  Administered 2017-03-12 – 2017-03-14 (×2): 1 ug via INTRAVENOUS
  Filled 2017-03-12: qty 2

## 2017-03-12 MED ORDER — METHOCARBAMOL 1000 MG/10ML IJ SOLN
500.0000 mg | Freq: Four times a day (QID) | INTRAVENOUS | Status: DC | PRN
  Administered 2017-03-12 (×2): 500 mg via INTRAVENOUS
  Filled 2017-03-12 (×3): qty 5

## 2017-03-12 MED ORDER — DARBEPOETIN ALFA 60 MCG/0.3ML IJ SOSY
60.0000 ug | PREFILLED_SYRINGE | INTRAMUSCULAR | Status: DC
  Administered 2017-03-14: 60 ug via INTRAVENOUS
  Filled 2017-03-12: qty 0.3

## 2017-03-12 MED ORDER — DOXERCALCIFEROL 4 MCG/2ML IV SOLN
INTRAVENOUS | Status: AC
  Filled 2017-03-12: qty 2

## 2017-03-12 MED ORDER — SENNOSIDES-DOCUSATE SODIUM 8.6-50 MG PO TABS
1.0000 | ORAL_TABLET | Freq: Two times a day (BID) | ORAL | Status: DC
  Administered 2017-03-12 – 2017-03-15 (×7): 1 via ORAL
  Filled 2017-03-12 (×7): qty 1

## 2017-03-12 MED ORDER — HYDROCODONE-ACETAMINOPHEN 5-325 MG PO TABS
1.0000 | ORAL_TABLET | ORAL | Status: DC | PRN
  Administered 2017-03-13 (×2): 2 via ORAL
  Administered 2017-03-14: 1 via ORAL
  Administered 2017-03-14: 2 via ORAL
  Administered 2017-03-14 (×2): 1 via ORAL
  Administered 2017-03-15: 2 via ORAL
  Filled 2017-03-12: qty 2
  Filled 2017-03-12: qty 1
  Filled 2017-03-12 (×2): qty 2
  Filled 2017-03-12: qty 1
  Filled 2017-03-12: qty 2

## 2017-03-12 MED ORDER — HYDROMORPHONE HCL 1 MG/ML IJ SOLN
0.5000 mg | INTRAMUSCULAR | Status: DC | PRN
  Administered 2017-03-12 – 2017-03-13 (×6): 1 mg via INTRAVENOUS
  Filled 2017-03-12 (×6): qty 1

## 2017-03-12 MED ORDER — POLYETHYLENE GLYCOL 3350 17 G PO PACK
17.0000 g | PACK | Freq: Every day | ORAL | Status: DC | PRN
  Administered 2017-03-14 – 2017-03-15 (×2): 17 g via ORAL
  Filled 2017-03-12 (×2): qty 1

## 2017-03-12 MED ORDER — ONDANSETRON HCL 4 MG/2ML IJ SOLN
INTRAMUSCULAR | Status: AC
  Filled 2017-03-12: qty 2

## 2017-03-12 MED ORDER — HYDROMORPHONE HCL 1 MG/ML IJ SOLN
1.0000 mg | Freq: Once | INTRAMUSCULAR | Status: AC
  Administered 2017-03-12: 1 mg via INTRAVENOUS
  Filled 2017-03-12: qty 1

## 2017-03-12 MED ORDER — HEPARIN SODIUM (PORCINE) 5000 UNIT/ML IJ SOLN
5000.0000 [IU] | Freq: Three times a day (TID) | INTRAMUSCULAR | Status: DC
  Administered 2017-03-12 – 2017-03-15 (×9): 5000 [IU] via SUBCUTANEOUS
  Filled 2017-03-12 (×9): qty 1

## 2017-03-12 NOTE — Consult Note (Signed)
[facility] Renal Consultation Note  Indication for Consultation:  Management of ESRD/hemodialysis; anemia, hypertension/volume and secondary hyperparathyroidism  HPI: Sherry Cox is a 56 y.o. female with ESRD 2/2 "kidney stones" on Chronic HD TTS ([facility]  [place] , Dr Hinda Lenis  Follows her ) admitted post fall with  Displaced right patella fracture and Dr Delfino Lovett preformed  03/11/17 Open reduction and internal fixation of right patella.   Last HD on schedule Sat 9/01 with LUA AVF ,usually getting to EDW and low wt gains. Currently pain controlled post op  And denies sob, chest pain , or fevers, chills, or abdominal  pain.      Past Medical History:  Diagnosis Date  . Ankle injury   . Anxiety   . Anxiety disorder   . CAD (coronary artery disease)    Total RCA, stress echo [facility] 11/2008-no ischemia, EF 55%; h/o CABG  . Carotid artery stenosis    12/10/2008 doppler 12-87% Sherry, 8-67% LICA/see evaluation by Dr. Oneida Alar 2011  . Ejection fraction    EF 60-65%, echo, June, 2009  . ESRD on dialysis [facility])    Transplant consideration  . Hemochromatosis   . Hx of CABG    Off pump LIMA to LAD  05/2006  . Hx of tobacco use, presenting hazards to health 04/26/2012  . Hyperlipidemia   . Irregular heart beat   . Murmur    October, 2012  . Neuropathy   . Overweight(278.02)   . Tobacco abuse     Past Surgical History:  Procedure Laterality Date  . AV FISTULA PLACEMENT     Has HD on Tues,Thurs, and Saturday at [facility]  . CARDIAC VALVE SURGERY  2007  . CAROTID ANGIOGRAM Bilateral 03/06/2013   Procedure: CAROTID ANGIOGRAM;  Surgeon: Elam Dutch, MD;  Location: [facility] CATH LAB;  Service: Cardiovascular;  Laterality: Bilateral;  . CHOLECYSTECTOMY  1995   Gall Bladder  . CORONARY ARTERY BYPASS GRAFT  05/2006   Off pump LIMA to LAD  . INNER EAR SURGERY    . ORIF PATELLA Right 03/11/2017   Procedure: OPEN REDUCTION INTERNAL (ORIF) FIXATION PATELLA;  Surgeon:  Rod Can, MD;  Location: [place];  Service: Orthopedics;  Laterality: Right;  . Plastic Surgery on leg        Family History  Problem Relation Age of Onset  . Depression Sister   . Dementia Sister   . OCD Sister   . Diabetes Sister   . Hypertension Sister   . Heart attack Sister   . Bipolar disorder Brother   . Drug abuse Brother   . Diabetes Brother   . Hypertension Brother   . Heart disease Brother   . Bipolar disorder Other   . ADD / ADHD Other   . Seizures Other   . Bipolar disorder Other   . Alcohol abuse Other   . Dementia Mother   . Heart disease Mother   . Hypertension Mother   . Heart attack Mother   . Alcohol abuse Father   . Heart disease Father        Heart Disease before age 16  . Hypertension Father   . Heart attack Father   . Dementia Maternal Aunt   . OCD Sister   . Heart disease Sister        Heart Disease before age 66  . Paranoid behavior Neg Hx   . Schizophrenia Neg Hx   . Sexual abuse Neg Hx   . Physical  abuse Neg Hx       reports that she has been smoking Cigarettes.  She started smoking about 45 years ago. She has a 9.00 pack-year smoking history. She has never used smokeless tobacco. She reports that she does not drink alcohol or use drugs.   Allergies  Allergen Reactions  . Augmentin [Amoxicillin-Pot Clavulanate] Nausea And Vomiting    Vomiting    Prior to Admission medications   Medication Sig Start Date End Date Taking? Authorizing Provider  albuterol (PROAIR HFA) 108 (90 BASE) MCG/ACT inhaler Inhale 2 puffs into the lungs every 4 (four) hours as needed. For wheezing   Yes [provider]  amLODipine (NORVASC) 10 MG tablet Take 1 tablet (10 mg total) by mouth daily. Patient taking differently: Take 10 mg by mouth at bedtime.  12/10/16  Yes BranchAlphonse Guild, MD  Artificial Tear Ointment (DRY EYES OP) Place 1 drop into both eyes daily.   Yes [provider]  aspirin EC 81 MG tablet Take 1 tablet (81 mg total) by  mouth daily. 12/11/13  Yes Carlena Bjornstad, MD  clonazePAM (KLONOPIN) 0.5 MG tablet Take 1 tablet (0.5 mg total) by mouth 4 (four) times daily. Patient taking differently: Take 0.5 mg by mouth as needed.  01/11/17  Yes Cloria Spring, MD  folic acid (FOLVITE) 1 MG tablet Take 1 mg by mouth daily.   Yes [provider]  folic acid-vitamin b complex-vitamin c-selenium-zinc (DIALYVITE) 3 MG TABS Take 1 tablet by mouth daily.     Yes [provider]  HYDROcodone-acetaminophen (NORCO/VICODIN) 5-325 MG tablet Take 1 tablet by mouth 3 (three) times daily. 12/20/15  Yes [provider]  ibuprofen (ADVIL,MOTRIN) 200 MG tablet Take 400 mg by mouth every 6 (six) hours as needed for mild pain.   Yes [provider]  levothyroxine (SYNTHROID, LEVOTHROID) 175 MCG tablet Take 175 mcg by mouth Daily.  03/21/11  Yes [provider]  LIPITOR 80 MG tablet TAKE 1 BY MOUTH DAILY      * 80MG TOTAL *             * STOP SIMVASTATIN * 11/22/16  Yes Branch, Alphonse Guild, MD  LOPRESSOR 50 MG tablet TAKE 1 BY MOUTH TWO TIMES  DAILY ON MONDAY, WEDNESDAY AND FRIDAY AND 1 ON ALL    OTHER DAYS Patient taking differently: TAKE 50 mg daily 11/29/16  Yes Branch, Alphonse Guild, MD  nitroGLYCERIN (NITROSTAT) 0.4 MG SL tablet Place 1 tablet (0.4 mg total) under the tongue every 5 (five) minutes as needed for chest pain. 12/10/16  Yes BranchAlphonse Guild, MD  OXYGEN Inhale into the lungs. On 2   Yes [provider]  sertraline (ZOLOFT) 100 MG tablet Take 1 tablet (100 mg total) by mouth daily. 01/11/17  Yes Cloria Spring, MD  sevelamer (RENVELA) 800 MG tablet Take 1,600-4,000 mg by mouth as directed. Take 5 tablets (4068m) by mouth with meals and 1600 tablets by mouth with snacks.   Yes [provider]  sodium bicarbonate 650 MG tablet Take 650 mg by mouth 2 (two) times daily.    Yes [provider]  sodium chloride (OCEAN) 0.65 % SOLN nasal spray Place 1 spray into both nostrils  as needed for congestion.   Yes [provider]  zolpidem (AMBIEN) 5 MG tablet Take 1 tablet (5 mg total) by mouth at bedtime as needed. For sleep Patient not taking: Reported on 03/10/2017 01/11/17   RCloria Spring  MD     Anti-infectives    Start     Dose/Rate Route Frequency Ordered Stop   03/11/17 2000  clindamycin (CLEOCIN) IVPB 600 mg     600 mg 100 mL/hr over 30 Minutes Intravenous Every 6 hours 03/11/17 1712 03/12/17 0951   03/11/17 0800  clindamycin (CLEOCIN) IVPB 900 mg     900 mg 100 mL/hr over 30 Minutes Intravenous To ShortStay Surgical 03/10/17 2100 03/11/17 1731      Results for orders placed or performed during the hospital encounter of 03/10/17 (from the past 48 hour(s))  CBC     Status: Abnormal   Collection Time: 03/10/17  6:47 PM  Result Value Ref Range   WBC 9.6 4.0 - 10.5 K/uL   RBC 3.29 (L) 3.87 - 5.11 MIL/uL   Hemoglobin 9.9 (L) 12.0 - 15.0 g/dL   HCT 31.7 (L) 36.0 - 46.0 %   MCV 96.4 78.0 - 100.0 fL   MCH 30.1 26.0 - 34.0 pg   MCHC 31.2 30.0 - 36.0 g/dL   RDW 17.9 (H) 11.5 - 15.5 %   Platelets 219 150 - 400 K/uL  Comprehensive metabolic panel     Status: Abnormal   Collection Time: 03/10/17  6:47 PM  Result Value Ref Range   Sodium 134 (L) 135 - 145 mmol/L   Potassium 4.6 3.5 - 5.1 mmol/L   Chloride 98 (L) 101 - 111 mmol/L   CO2 25 22 - 32 mmol/L   Glucose, Bld 110 (H) 65 - 99 mg/dL   BUN 16 6 - 20 mg/dL   Creatinine, Ser 5.33 (H) 0.44 - 1.00 mg/dL   Calcium 8.5 (L) 8.9 - 10.3 mg/dL   Total Protein 6.1 (L) 6.5 - 8.1 g/dL   Albumin 3.3 (L) 3.5 - 5.0 g/dL   AST 18 15 - 41 U/L   ALT 16 14 - 54 U/L   Alkaline Phosphatase 69 38 - 126 U/L   Total Bilirubin 0.6 0.3 - 1.2 mg/dL   GFR calc non Af Amer 8 (L) >60 mL/min   GFR calc Af Amer 9 (L) >60 mL/min    Comment: (NOTE) The eGFR has been calculated using the CKD EPI equation. This calculation has not been validated in all clinical situations. eGFR's persistently <60 mL/min signify possible  Chronic Kidney Disease.    Anion gap 11 5 - 15  Protime-INR     Status: None   Collection Time: 03/10/17  6:47 PM  Result Value Ref Range   Prothrombin Time 13.0 11.4 - 15.2 seconds   INR 0.99   Surgical pcr screen     Status: None   Collection Time: 03/10/17 10:37 PM  Result Value Ref Range   MRSA, PCR NEGATIVE NEGATIVE   Staphylococcus aureus NEGATIVE NEGATIVE    Comment: (NOTE) The Xpert SA Assay (FDA approved for NASAL specimens in patients 105 years of age and older), is one component of a comprehensive surveillance program. It is not intended to diagnose infection nor to guide or monitor treatment.   Comprehensive metabolic panel     Status: Abnormal   Collection Time: 03/11/17  3:38 AM  Result Value Ref Range   Sodium 136 135 - 145 mmol/L   Potassium 5.2 (H) 3.5 - 5.1 mmol/L    Comment: SPECIMEN HEMOLYZED. HEMOLYSIS MAY AFFECT INTEGRITY OF RESULTS.   Chloride 98 (L) 101 - 111 mmol/L   CO2 27 22 - 32 mmol/L   Glucose, Bld 74 65 - 99 mg/dL  BUN 18 6 - 20 mg/dL   Creatinine, Ser 6.24 (H) 0.44 - 1.00 mg/dL   Calcium 8.3 (L) 8.9 - 10.3 mg/dL   Total Protein 5.2 (L) 6.5 - 8.1 g/dL   Albumin 3.0 (L) 3.5 - 5.0 g/dL   AST 29 15 - 41 U/L   ALT 15 14 - 54 U/L   Alkaline Phosphatase 63 38 - 126 U/L   Total Bilirubin 1.0 0.3 - 1.2 mg/dL   GFR calc non Af Amer 7 (L) >60 mL/min   GFR calc Af Amer 8 (L) >60 mL/min    Comment: (NOTE) The eGFR has been calculated using the CKD EPI equation. This calculation has not been validated in all clinical situations. eGFR's persistently <60 mL/min signify possible Chronic Kidney Disease.    Anion gap 11 5 - 15  CBC WITH DIFFERENTIAL     Status: Abnormal   Collection Time: 03/11/17  3:38 AM  Result Value Ref Range   WBC 7.5 4.0 - 10.5 K/uL   RBC 2.90 (L) 3.87 - 5.11 MIL/uL   Hemoglobin 8.8 (L) 12.0 - 15.0 g/dL   HCT 28.2 (L) 36.0 - 46.0 %   MCV 97.2 78.0 - 100.0 fL   MCH 30.3 26.0 - 34.0 pg   MCHC 31.2 30.0 - 36.0 g/dL   RDW 18.1  (H) 11.5 - 15.5 %   Platelets 238 150 - 400 K/uL   Neutrophils Relative % 76 %   Neutro Abs 5.7 1.7 - 7.7 K/uL   Lymphocytes Relative 15 %   Lymphs Abs 1.1 0.7 - 4.0 K/uL   Monocytes Relative 7 %   Monocytes Absolute 0.5 0.1 - 1.0 K/uL   Eosinophils Relative 2 %   Eosinophils Absolute 0.1 0.0 - 0.7 K/uL   Basophils Relative 0 %   Basophils Absolute 0.0 0.0 - 0.1 K/uL  HIV antibody     Status: None   Collection Time: 03/11/17  3:38 AM  Result Value Ref Range   HIV Screen 4th Generation wRfx Non Reactive Non Reactive    Comment: (NOTE) Performed At: [facility] [place], [place] [id number] Lindon Romp MD [id number]   CBC     Status: Abnormal   Collection Time: 03/12/17  5:43 AM  Result Value Ref Range   WBC 10.4 4.0 - 10.5 K/uL   RBC 2.94 (L) 3.87 - 5.11 MIL/uL   Hemoglobin 9.0 (L) 12.0 - 15.0 g/dL   HCT 28.4 (L) 36.0 - 46.0 %   MCV 96.6 78.0 - 100.0 fL   MCH 30.6 26.0 - 34.0 pg   MCHC 31.7 30.0 - 36.0 g/dL   RDW 18.0 (H) 11.5 - 15.5 %   Platelets 235 150 - 400 K/uL  Basic metabolic panel     Status: Abnormal   Collection Time: 03/12/17  5:43 AM  Result Value Ref Range   Sodium 132 (L) 135 - 145 mmol/L   Potassium 5.5 (H) 3.5 - 5.1 mmol/L   Chloride 95 (L) 101 - 111 mmol/L   CO2 25 22 - 32 mmol/L   Glucose, Bld 94 65 - 99 mg/dL   BUN 28 (H) 6 - 20 mg/dL   Creatinine, Ser 7.53 (H) 0.44 - 1.00 mg/dL   Calcium 8.5 (L) 8.9 - 10.3 mg/dL   GFR calc non Af Amer 5 (L) >60 mL/min   GFR calc Af Amer 6 (L) >60 mL/min    Comment: (NOTE) The eGFR has been calculated using the CKD EPI  equation. This calculation has not been validated in all clinical situations. eGFR's persistently <60 mL/min signify possible Chronic Kidney Disease.    Anion gap 12 5 - 15     ROS: see hpi for positives    Physical Exam: Vitals:   03/12/17 0448 03/12/17 0841  BP: (!) 156/46 (!) 162/53  Pulse: 62 63  Resp: 17   Temp: 98.6 F (37 C)   SpO2: 96%       General: alert WD WN ,WF  NAD  HEENT: [place]  EOMI, MMM  Neck: supple ,no jvd  Heart: RRR  No mur , rub or gallop Lungs: CTA  Anteriorly  Unlabored breathing  Abdomen: Obese soft , Nt , ND Extremities: No  Pedal edema / R lower leg in brace Skin:  No overt rash , warm  Neuro: Alert OX4  Dialysis Access: Pos bruit LUA AVF with mild aneurysmal  Formations   Dialysis Orders: Center: [facility]   on TTS . EDW 80.5 kg HD Bath 2k 2.5 ca  Time 3hrs Heparin 2,000 . Access LUA AVF BFR 400 DFR 600    Hec. 1 mcg IV/HD Epogen 32,000   Units IV/HD    Assessment/Plan 1. Displaced right patella fracture/ sp  ORIF- RTX per Ortho/Admit  2. ESRD -  HD Schedule TTS  k 5.5  HD today   3. Hypertension/volume  -  Home meds =Amlodipine 10 mg q day  / Lopressor 50 mg bid / uf 1 l today  4. Anemia  - EPO on hd  Give Thurs Aranesp here/monitor HGB trend post op  5. Metabolic bone disease -  Hec. On hd / Renvela as binder  6. CAD - Asymp , On statin , Aspirin   Admit team  RX   Ernest Haber, PA-C [place] [phone] 03/12/2017, 1:39 PM   Pt seen, examined and agree w A/P as above.  Kelly Splinter MD [employer] pager [phone]   03/12/2017, 3:30 PM

## 2017-03-12 NOTE — Progress Notes (Signed)
[facility]                                                                              Patient Demographics  Sherry Cox, is a 56 y.o. female, DOB - 1960-08-05, DDU:[id number]  Admit date - 03/10/2017   Admitting Physician Sherry Gravel, MD  Outpatient Primary MD for the patient is Patient, No Pcp Per  Outpatient specialists:   LOS - 2  days   Medical records reviewed and are as summarized below:    Chief Complaint  Patient presents with  . Fall  . Knee Injury       Brief summary   Patient is a 56 year old female with CAD status post CABG, carotid stenosis, ESRD on HD, TTS, presented with a mechanical fall, right knee pain. CT knee showed horizontal patellar fracture with 3.4 cm of distraction, tiny avulsion fracture right of the medial aspect of patella. Orthopedics was consulted, Underwent ORIF of the right patellar fracture on 9/3   Assessment & Plan    Principal Problem:  acute right Patellar fracture status post mechanical fall - Orthopedics was consulted, patient underwent ORIF of the right patellar fracture on 9/3, postop day #1 - Pain controlled, H&H stable, 9.0 - Start PTOT today, management per orthopedics  Active Problems:   ESRD on dialysis (HCC)TTS - Last dialysis on Saturday - needs HD today, nephrology consulted - continue Renvela, sodium bicarbonate    CAD (coronary artery disease) - currently symptomatic,continue aspirin, statin    Hyponatremia - mild  Anemia of chronic disease sec to ESRD - Patient is stable 9.0  Hypothyroidism - Continue Synthroid  Anxiety - Continue Klonopin, sertraline per outpatient home does  Code Status: full CODE STATUS DVT Prophylaxis:  Started on heparin subcutaneous Family Communication: Discussed in detail with the patient, all imaging results, lab results explained to the patient    Disposition Plan:   Time Spent in minutes   25 minutes  Procedures:    Consultants:    Renal  Ortho  Antimicrobials:      Medications  Scheduled Meds: . amLODipine  10 mg Oral QHS  . aspirin EC  81 mg Oral BID WC  . atorvastatin  80 mg Oral q1800  . folic acid  1 mg Oral Daily  . levothyroxine  175 mcg Oral QAC breakfast  . metoprolol tartrate  50 mg Oral BID  . multivitamin  1 tablet Oral QHS  . senna-docusate  1 tablet Oral BID  . sertraline  100 mg Oral Daily  . sevelamer carbonate  4,000 mg Oral TID WC  . sodium bicarbonate  650 mg Oral BID  . sodium chloride flush  3 mL Intravenous Q12H   Continuous Infusions: . methocarbamol (ROBAXIN)  IV Stopped (03/12/17 0856)   PRN Meds:.acetaminophen **OR** acetaminophen, albuterol, clonazePAM, diphenhydrAMINE, HYDROcodone-acetaminophen, HYDROmorphone (DILAUDID) injection, methocarbamol (ROBAXIN)  IV, metoCLOPramide **OR** metoCLOPramide (REGLAN) injection, nitroGLYCERIN, ondansetron (ZOFRAN) IV, polyethylene glycol, sevelamer carbonate, sodium chloride, sodium chloride flush   Antibiotics   Anti-infectives    Start     Dose/Rate Route Frequency Ordered Stop   03/11/17 2000  clindamycin (CLEOCIN) IVPB 600 mg  600 mg 100 mL/hr over 30 Minutes Intravenous Every 6 hours 03/11/17 1712 03/12/17 0951   03/11/17 0800  clindamycin (CLEOCIN) IVPB 900 mg     900 mg 100 mL/hr over 30 Minutes Intravenous To [facility] 03/10/17 2100 03/11/17 1731        Subjective:   Sherry Cox was seen and examined today. Pain controlled. Patient denies dizziness, chest pain, shortness of breath, abdominal pain, N/V/D/C, new weakness, numbess, tingling. No acute events overnight.  Pain in the right leg controlled  Objective:   Vitals:   03/11/17 1700 03/11/17 2028 03/12/17 0448 03/12/17 0841  BP: (!) 139/44 (!) 158/49 (!) 156/46 (!) 162/53  Pulse: (!) 51 (!) 59 62 63  Resp: 12 18 17    Temp: 97.8 F (36.6 C) 98 F (36.7 C) 98.6 F (37 C)   TempSrc:   Oral   SpO2:  96% 96%   Weight:   87 kg (191 lb 12.8 oz)    Height:        Intake/Output Summary (Last 24 hours) at 03/12/17 1328 Last data filed at 03/12/17 9629  Gross per 24 hour  Intake           618.83 ml  Output              900 ml  Net          -281.17 ml     Wt Readings from Last 3 Encounters:  03/12/17 87 kg (191 lb 12.8 oz)  12/10/16 88.5 kg (195 lb)  12/23/15 95.7 kg (211 lb)     Exam General: Alert and oriented x 3, NAD Eyes:  HEENT:  Atraumatic, normocephalic Cardiovascular: S1 S2 auscultated, Regular rate and rhythm. No pedal edema LLE Respiratory: Clear to auscultation bilaterally, no wheezing, rales or rhonchi Gastrointestinal: Soft, nontender, nondistended, + bowel sounds Ext: no pedal edema bilaterally, right lower extremity in the immobilizer Neuro: no new deficits Musculoskeletal: No digital cyanosis, clubbing Skin: No rashes Psych: Normal affect and demeanor, alert and oriented x3      Data Reviewed:  I have personally reviewed following labs and imaging studies  Micro Results Recent Results (from the past 240 hour(s))  Surgical pcr screen     Status: None   Collection Time: 03/10/17 10:37 PM  Result Value Ref Range Status   MRSA, PCR NEGATIVE NEGATIVE Final   Staphylococcus aureus NEGATIVE NEGATIVE Final    Comment: (NOTE) The Xpert SA Assay (FDA approved for NASAL specimens in patients 58 years of age and older), is one component of a comprehensive surveillance program. It is not intended to diagnose infection nor to guide or monitor treatment.     Radiology Reports Dg Knee 1-2 Views Right  Result Date: 03/11/2017 CLINICAL DATA:  56 year old female undergoing ORIF right patella fracture. EXAM: RIGHT KNEE - 1-2 VIEW; DG C-ARM 61-120 MIN COMPARISON:  Right knee CT 03/10/2017 FLUOROSCOPY TIME:  0 minutes 30 seconds FINDINGS: Intraoperative fluoroscopic spot views of the right knee in the AP and lateral projection. Two cannulated screws traverse the horizontal mid patella fracture. Fracture alignment  appears near anatomic. The hardware appears intact. IMPRESSION: Right patella ORIF with no adverse features. Electronically Signed   By: Genevie Ann M.D.   On: 03/11/2017 15:14   Ct Knee Right Wo Contrast  Result Date: 03/10/2017 CLINICAL DATA:  Patient slipped and fell on within floor home today. Right knee pain. EXAM: CT OF THE  KNEE WITHOUT CONTRAST TECHNIQUE: Multidetector CT imaging of the knee  was performed according to the standard protocol. Multiplanar CT image reconstructions were also generated. COMPARISON:  None. FINDINGS: Bones/Joint/Cartilage An acute, closed, horizontal fracture through the body of the patella with 3.4 cm of distraction is noted. Tiny avulsion off the medial patella may reflect a tiny avulsion involving the medial patellofemoral ligament. There is a shallow impaction fracture of the anterior lateral femoral condyle, series 68, image 81 and series 3, image 86. Ligaments Suboptimally assessed by CT. Muscles and Tendons No intramuscular hemorrhage or atrophy. Soft tissues Prepatellar soft tissue contusion and swelling is noted with interposed 3.7 x 5.7 x 3 cm hematoma between the patellar fragments. Small joint effusion. IMPRESSION: 1. Acute, closed, horizontal fracture through the body of the patella with 3.4 cm of distraction noted. Interposed hematoma with overlying prepatellar soft tissue swelling. 2. Tiny avulsion fracture right off the medial aspect of the patella may reflect a small avulsion involving the medial patellofemoral ligament. 3. Subtle shallow impaction fracture of the anterior aspect of the lateral femoral condyle. Electronically Signed   By: Ashley Royalty M.D.   On: 03/10/2017 19:28   Dg Knee Complete 4 Views Right  Result Date: 03/10/2017 CLINICAL DATA:  Fall onto knee EXAM: RIGHT KNEE - COMPLETE 4+ VIEW COMPARISON:  None. FINDINGS: There is a fracture horizontally through the body of the patella with distraction of the fracture fragments by 3 cm. Prepatellar soft  tissue swelling. No fracture the tibia or femur. IMPRESSION: Horizontal fracture through the midbody of the patella with 3 cm of distraction Electronically Signed   By: Suzy Bouchard M.D.   On: 03/10/2017 17:33   Dg Knee Right Port  Result Date: 03/11/2017 CLINICAL DATA:  Post op patella surgery EXAM: PORTABLE RIGHT KNEE - 1-2 VIEW COMPARISON:  03/10/2017 FINDINGS: Interval 2 screw fixation of patellar fracture with near anatomic alignment. Soft tissue gas over the anterior knee, consistent with recent postoperative change. IMPRESSION: Interval screw fixation of patellar fracture with expected postsurgical changes Electronically Signed   By: Donavan Foil M.D.   On: 03/11/2017 16:00   Dg C-arm 1-60 Min  Result Date: 03/11/2017 CLINICAL DATA:  56 year old female undergoing ORIF right patella fracture. EXAM: RIGHT KNEE - 1-2 VIEW; DG C-ARM 61-120 MIN COMPARISON:  Right knee CT 03/10/2017 FLUOROSCOPY TIME:  0 minutes 30 seconds FINDINGS: Intraoperative fluoroscopic spot views of the right knee in the AP and lateral projection. Two cannulated screws traverse the horizontal mid patella fracture. Fracture alignment appears near anatomic. The hardware appears intact. IMPRESSION: Right patella ORIF with no adverse features. Electronically Signed   By: Genevie Ann M.D.   On: 03/11/2017 15:14    Lab Data:  CBC:  Recent Labs Lab 03/10/17 1847 03/11/17 0338 03/12/17 0543  WBC 9.6 7.5 10.4  NEUTROABS  --  5.7  --   HGB 9.9* 8.8* 9.0*  HCT 31.7* 28.2* 28.4*  MCV 96.4 97.2 96.6  PLT 219 238 536   Basic Metabolic Panel:  Recent Labs Lab 03/10/17 1847 03/11/17 0338 03/12/17 0543  NA 134* 136 132*  K 4.6 5.2* 5.5*  CL 98* 98* 95*  CO2 25 27 25   GLUCOSE 110* 74 94  BUN 16 18 28*  CREATININE 5.33* 6.24* 7.53*  CALCIUM 8.5* 8.3* 8.5*   GFR: Estimated Creatinine Clearance: 7.8 mL/min (A) (by C-G formula based on SCr of 7.53 mg/dL (H)). Liver Function Tests:  Recent Labs Lab 03/10/17 1847  03/11/17 0338  AST 18 29  ALT 16 15  ALKPHOS 69 63  BILITOT 0.6 1.0  PROT 6.1* 5.2*  ALBUMIN 3.3* 3.0*   No results for input(s): LIPASE, AMYLASE in the last 168 hours. No results for input(s): AMMONIA in the last 168 hours. Coagulation Profile:  Recent Labs Lab 03/10/17 1847  INR 0.99   Cardiac Enzymes: No results for input(s): CKTOTAL, CKMB, CKMBINDEX, TROPONINI in the last 168 hours. BNP (last 3 results) No results for input(s): PROBNP in the last 8760 hours. HbA1C: No results for input(s): HGBA1C in the last 72 hours. CBG: No results for input(s): GLUCAP in the last 168 hours. Lipid Profile: No results for input(s): CHOL, HDL, LDLCALC, TRIG, CHOLHDL, LDLDIRECT in the last 72 hours. Thyroid Function Tests: No results for input(s): TSH, T4TOTAL, FREET4, T3FREE, THYROIDAB in the last 72 hours. Anemia Panel: No results for input(s): VITAMINB12, FOLATE, FERRITIN, TIBC, IRON, RETICCTPCT in the last 72 hours. Urine analysis: No results found for: COLORURINE, APPEARANCEUR, LABSPEC, PHURINE, GLUCOSEU, HGBUR, BILIRUBINUR, KETONESUR, PROTEINUR, UROBILINOGEN, NITRITE, LEUKOCYTESUR   Ripudeep Rai M.D. [facility] 03/12/2017, 1:28 PM  Pager: [phone] Between 7am to 7pm - call Pager - [phone]  After 7pm go to [email or web address] - password [email or web address]  Call night coverage person covering after 7pm

## 2017-03-12 NOTE — Op Note (Signed)
NAME:  Sherry Cox, Sherry Cox                  ACCOUNT NO.:  MEDICAL RECORD NO.:  [id number]  LOCATION:                                 FACILITY:  PHYSICIAN:  Rod Can, MD     DATE OF BIRTH:  27-Dec-1960  DATE OF PROCEDURE:  03/11/2017 DATE OF DISCHARGE:                              OPERATIVE REPORT   SURGEON:  Rod Can, MD  ASSISTANT:  Merla Riches, PA-C  PREOPERATIVE DIAGNOSIS:  Displaced right patella fracture.  POSTOPERATIVE DIAGNOSIS:  Displaced right patella fracture.  PROCEDURE PERFORMED:  Open reduction and internal fixation of right patella.  IMPLANTS:  Biomet ACE 4.0 mm cannulated screw x2.  ANESTHESIA:  Spinal plus IV sedation.  ESTIMATED BLOOD LOSS:  Minimal.  TOURNIQUET TIME:  34 minutes.  COMPLICATIONS:  None.  TUBES AND DRAINS:  Prevena wound VAC.  COMPLICATIONS:  None.  DISPOSITION:  Stable to PACU.  INDICATIONS:  The patient is a 56 year old female with multiple medical problems including end-stage renal disease, on hemodialysis Tuesday, Thursday, and Saturday.  She fell ground level at home yesterday onto her right knee.  She had right knee pain, inability to weightbear.  She was brought to the emergency department at [facility] where x- rays revealed a displaced right patella fracture.  She was admitted to the hospitalist service, underwent perioperative risk stratification and medical optimization.  We discussed the risks, benefits, and alternatives to ORIF of the right patella.  She elected to proceed.  DESCRIPTION OF PROCEDURE IN DETAIL:  I identified the patient in holding area using 2 identifiers.  The surgical site was marked by myself.  She was taken to the operating room and spinal anesthesia was induced without any difficulty.  She was then positioned supine on the operating room table.  All bony prominences were well padded.  Nonsterile tourniquet was applied to the right groin.  The right lower extremity was prepped and  draped in a normal sterile surgical fashion.  Time-out was called verifying side and site of surgery.  She did receive 900 mg of clindamycin within 60 minutes of beginning the procedure.  I began by using Esmarch to exsanguinate the lower extremity.  I elevated the tourniquet to 300 mmHg.  She did have a laterally-based small superficial abrasion.  I made a curvilinear incision over the patella to avoid this area.  Full-thickness skin flaps were created.  I immediately identified her fracture hematoma which I evacuated.  I sharply debrided the fracture ends with a rongeur.  She did have a transverse patella fracture.  There was no significant comminution.  She did have significant medial and lateral retinacular tears.  I copiously irrigated the knee with saline.  I then reduced the fracture and held the reduction with a point-to-point clamp.  I then placed 2 guidewires for 4.0 mm cannulated screws in a retrograde fashion.  I checked the pin placement and I checked the fracture reduction on biplanar fluoroscopy views.  I then drilled the near cortex.  I measured the length of the screws.  I placed two 34 mm screws.  They got excellent purchase.  I removed the clamp.  I then used a  [place] suture passer to pass a #2 FiberWire through the screws in figure-of-eight fashion.  The suture was tightened and the ends were clipped.  I then obtained final AP and lateral fluoroscopy views confirming a fracture reduction and hardware placement.  There was no chondral penetration.  The wound was then irrigated.  I closed the retinacular tears with #1 Vicryl.  I then closed the deep dermal layer with 2-0 interrupted Monocryl.  Skin was closed with 2-0 nylon vertical mattress.  Prevena VAC was applied followed by an Ace wrap and a knee immobilizer.  The patient was then aroused from anesthesia, taken to PACU in stable condition.  Sponge, needle, and instrument counts were correct at the end of the case  x2. There were no known complications.  Please note that Merla Riches, PA was used throughout the case.  He helped with positioning, reducing the fracture, placing the hardware, closing the wound in a timely fashion.  Postoperatively, we will readmit the patient to the hospital.  She will keep the knee in full extension at all times.  She will wear a knee immobilizer at all times.  She may weightbear as tolerated in the knee immobilizer.  We will place her on aspirin for DVT prophylaxis.  She will work with Physical therapy and undergo disposition planning.  We will need to convert her VAC unit to a portable unit but retain her dressing on discharge.  I will see her 1 week after she leaves the hospital to remove the VAC.  All questions were solicited and answered.          ______________________________ Rod Can, MD     BS/MEDQ  D:  03/11/2017  T:  03/11/2017  Job:  [id number]

## 2017-03-12 NOTE — Evaluation (Signed)
Physical Therapy Evaluation Patient Details Name: Sherry Cox MRN: [id number] DOB: Nov 19, 1960 Today's Date: 03/12/2017   History of Present Illness  [name] a 56 y.o.female,CAD s/p CABG, carotid stenosis, w ESRD on HD (T, T, S), no hx of osteoporosis, apparently c/o Fall onto a divider for a screen door on the ground where she hurt her knee cap. Pt found to have a patella fx. Pt s/p ORIF of R patella 9/3.  Clinical Impression  Pt tolerated OOB mobility well. Pt able to tolerate approx 25% WBing on R LE. Pt educated on proper fit of R KI. Acute PT to con't to follow. 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    Follow Up Recommendations No PT follow up;Supervision/Assistance - 24 hour (will need outpt once cleared by MD for R LE)    Equipment Recommendations  Rolling walker with 5" wheels;3in1 (PT) (YOUTH RW, tub bench)    Recommendations for Other Services       Precautions / Restrictions Precautions Precautions: Fall Precaution Comments: R KI on at all times, no knee flexion Required Braces or Orthoses: Knee Immobilizer - Right (adjusted for optimal support) Knee Immobilizer - Right: On at all times Restrictions Weight Bearing Restrictions: Yes RLE Weight Bearing: Weight bearing as tolerated (in KI) Other Position/Activity Restrictions: NO KNEE FLEXION      Mobility  Bed Mobility Overal bed mobility: Needs Assistance Bed Mobility: Supine to Sit     Supine to sit: Min assist;HOB elevated;Mod assist     General bed mobility comments: minA for R LE management, pt used PT to pull trunk up to sit EOB  Transfers Overall transfer level: Needs assistance Equipment used: Rolling walker (2 wheeled) Transfers: Sit to/from Stand Sit to Stand: Min assist         General transfer comment: v/c's for hand placement  Ambulation/Gait Ambulation/Gait assistance: Min guard Ambulation Distance (Feet): 15 Feet Assistive device: Rolling walker (2 wheeled) Gait  Pattern/deviations: Step-to pattern Gait velocity: slow Gait velocity interpretation: Below normal speed for age/gender General Gait Details: decreased R LE WBing due to pain, very slow, v/c's for sequencing  Stairs            Wheelchair Mobility    Modified Rankin (Stroke Patients Only)       Balance Overall balance assessment: Needs assistance         Standing balance support: During functional activity Standing balance-Leahy Scale: Fair Standing balance comment: needs RW for safe amb                             Pertinent Vitals/Pain Pain Assessment: 0-10 Pain Score: 5  Pain Location: R knee Pain Descriptors / Indicators: Spasm Pain Intervention(s): Monitored during session (pt just received robaxin)    Home Living Family/patient expects to be discharged to:: Private residence Living Arrangements: Spouse/significant other Available Help at Discharge: Family;Available 24 hours/day Type of Home: House Home Access: Stairs to enter Entrance Stairs-Rails: None Entrance Stairs-Number of Steps: 2 Home Layout: One level Home Equipment: Cane - single point      Prior Function Level of Independence: Independent         Comments: doesn't drive, goes to HD T, TH, S     Hand Dominance   Dominant Hand: Right    Extremity/Trunk Assessment   Upper Extremity Assessment Upper Extremity Assessment: Overall WFL for tasks assessed    Lower Extremity Assessment Lower Extremity Assessment: RLE deficits/detail RLE Deficits / Details:  no knee ROM per orders, ankle flexion wnl    Cervical / Trunk Assessment Cervical / Trunk Assessment: Normal  Communication   Communication: No difficulties  Cognition Arousal/Alertness: Awake/alert Behavior During Therapy: WFL for tasks assessed/performed Overall Cognitive Status: Within Functional Limits for tasks assessed                                        General Comments General comments  (skin integrity, edema, etc.): pt reporting of blister on back of R keg from metal stays in [place], PT did not see blister but did place pad over section to prevent further rubbing    Exercises General Exercises - Lower Extremity Ankle Circles/Pumps: AROM;Both;5 reps;Supine Quad Sets: AROM;Left;10 reps;Supine (with 5 sec hold)   Assessment/Plan    PT Assessment Patient needs continued PT services  PT Problem List Decreased strength;Decreased activity tolerance;Decreased balance;Decreased mobility;Pain;Decreased knowledge of use of DME       PT Treatment Interventions DME instruction;Gait training;Stair training;Functional mobility training;Therapeutic activities;Therapeutic exercise;Balance training    PT Goals (Current goals can be found in the Care Plan section)  Acute Rehab PT Goals Patient Stated Goal: stop the spasms PT Goal Formulation: With patient Time For Goal Achievement: 03/19/17 Potential to Achieve Goals: Good    Frequency Min 4X/week   Barriers to discharge Inaccessible home environment 2 steps without handrail    Co-evaluation               AM-PAC PT "6 Clicks" Daily Activity  Outcome Measure Difficulty turning over in bed (including adjusting bedclothes, sheets and blankets)?: A Lot Difficulty moving from lying on back to sitting on the side of the bed? : A Little Difficulty sitting down on and standing up from a chair with arms (e.g., wheelchair, bedside commode, etc,.)?: A Little Help needed moving to and from a bed to chair (including a wheelchair)?: A Little Help needed walking in hospital room?: A Little Help needed climbing 3-5 steps with a railing? : A Lot 6 Click Score: 16    End of Session Equipment Utilized During Treatment: Gait belt Activity Tolerance: Patient tolerated treatment well Patient left: in chair;with call bell/phone within reach Nurse Communication: Mobility status PT Visit Diagnosis: Unsteadiness on feet (R26.81);History of  falling (Z91.81);Difficulty in walking, not elsewhere classified (R26.2);Pain Pain - Right/Left: Right Pain - part of body: Knee    Time: 0828-0908 PT Time Calculation (min) (ACUTE ONLY): 40 min   Charges:   PT Evaluation $PT Eval Moderate Complexity: 1 Mod PT Treatments $Gait Training: 8-22 mins $Therapeutic Activity: 8-22 mins   PT G Codes:        Kittie Plater, PT, DPT Pager #: [phone] Office #: [phone]   [place] 03/12/2017, 9:34 AM

## 2017-03-12 NOTE — Progress Notes (Signed)
CM received consult:  Equipment     Home health needs    Medication needs    Other (see comments)       Comments   PT / OT / SLP DME as needed      PT/OT consults pending, CM to f/u with disposition needs. Whitman Hero RN,BSN,CM [phone]

## 2017-03-12 NOTE — Progress Notes (Signed)
   Subjective:  Patient reports pain as moderate to severe.  C/o poor pain control; muscle spasms.  Objective:   VITALS:   Vitals:   03/11/17 1615 03/11/17 1700 03/11/17 2028 03/12/17 0448  BP:  (!) 139/44 (!) 158/49 (!) 156/46  Pulse:  (!) 51 (!) 59 62  Resp:  12 18 17   Temp: 97.7 F (36.5 C) 97.8 F (36.6 C) 98 F (36.7 C) 98.6 F (37 C)  TempSrc:    Oral  SpO2:   96% 96%  Weight:    87 kg (191 lb 12.8 oz)  Height:        NAD ABD soft Sensation intact distally Intact pulses distally Dorsiflexion/Plantar flexion intact Incision: dressing C/D/I Compartment soft VAC intact Knee immobilizer in place   Lab Results  Component Value Date   WBC 10.4 03/12/2017   HGB 9.0 (L) 03/12/2017   HCT 28.4 (L) 03/12/2017   MCV 96.6 03/12/2017   PLT 235 03/12/2017   BMET    Component Value Date/Time   NA 132 (L) 03/12/2017 0543   K 5.5 (H) 03/12/2017 0543   CL 95 (L) 03/12/2017 0543   CO2 25 03/12/2017 0543   GLUCOSE 94 03/12/2017 0543   BUN 28 (H) 03/12/2017 0543   CREATININE 7.53 (H) 03/12/2017 0543   CALCIUM 8.5 (L) 03/12/2017 0543   GFRNONAA 5 (L) 03/12/2017 0543   GFRAA 6 (L) 03/12/2017 0543     Assessment/Plan: 1 Day Post-Op   Principal Problem:   Patellar fracture Active Problems:   ESRD on dialysis (HCC)   CAD (coronary artery disease)   Hyponatremia   Displaced transverse fracture of right patella, initial encounter for closed fracture   WBAT RLE with walker Knee immobilizer at all times Maintain strict extension of right knee DVT ppx: ASA, SCDs, TEDs PT/OT Pain control: changed frequency of norco, increased PRN dilaudid dose, added robaxin for spasm Dispo: D/C planning, will need to switch house VAC unit to prevena portable unit upon d/c   Sherry Cox, Sherry Cox 03/12/2017, 6:51 AM   Rod Can, MD Cell [phone]

## 2017-03-12 NOTE — Progress Notes (Signed)
Patient complaining of spasms in leg during night and pain 10/10 most of the night.  Patient would benefit in an increase in dosage of dilaudid.  Will continue to monitor.

## 2017-03-13 DIAGNOSIS — N186 End stage renal disease: Secondary | ICD-10-CM

## 2017-03-13 DIAGNOSIS — E871 Hypo-osmolality and hyponatremia: Secondary | ICD-10-CM

## 2017-03-13 DIAGNOSIS — S82001A Unspecified fracture of right patella, initial encounter for closed fracture: Secondary | ICD-10-CM

## 2017-03-13 DIAGNOSIS — Z992 Dependence on renal dialysis: Secondary | ICD-10-CM

## 2017-03-13 DIAGNOSIS — I2510 Atherosclerotic heart disease of native coronary artery without angina pectoris: Secondary | ICD-10-CM

## 2017-03-13 DIAGNOSIS — S82031A Displaced transverse fracture of right patella, initial encounter for closed fracture: Principal | ICD-10-CM

## 2017-03-13 LAB — BASIC METABOLIC PANEL
ANION GAP: 10 (ref 5–15)
BUN: 13 mg/dL (ref 6–20)
CHLORIDE: 95 mmol/L — AB (ref 101–111)
CO2: 27 mmol/L (ref 22–32)
CREATININE: 4.76 mg/dL — AB (ref 0.44–1.00)
Calcium: 8.4 mg/dL — ABNORMAL LOW (ref 8.9–10.3)
GFR, EST AFRICAN AMERICAN: 11 mL/min — AB (ref >60)
GFR, EST NON AFRICAN AMERICAN: 9 mL/min — AB (ref >60)
GLUCOSE: 94 mg/dL (ref 65–99)
Potassium: 4.2 mmol/L (ref 3.5–5.1)
SODIUM: 132 mmol/L — AB (ref 135–145)

## 2017-03-13 LAB — CBC
HCT: 25.7 % — ABNORMAL LOW (ref 36.0–46.0)
HEMOGLOBIN: 8 g/dL — AB (ref 12.0–15.0)
MCH: 30 pg (ref 26.0–34.0)
MCHC: 31.1 g/dL (ref 30.0–36.0)
MCV: 96.3 fL (ref 78.0–100.0)
Platelets: 186 10*3/uL (ref 150–400)
RBC: 2.67 MIL/uL — AB (ref 3.87–5.11)
RDW: 18.2 % — ABNORMAL HIGH (ref 11.5–15.5)
WBC: 7.5 10*3/uL (ref 4.0–10.5)

## 2017-03-13 MED ORDER — ORAL CARE MOUTH RINSE
15.0000 mL | Freq: Two times a day (BID) | OROMUCOSAL | Status: DC
  Administered 2017-03-13 – 2017-03-15 (×4): 15 mL via OROMUCOSAL

## 2017-03-13 NOTE — Progress Notes (Signed)
PROGRESS NOTE    Sherry Cox  AUQ:[id number] DOB: 1960/12/28 DOA: 03/10/2017 PCP: Patient, No Pcp Per   Brief Narrative:  Patient is a 56 year old female with CAD status post CABG, carotid stenosis, ESRD on HD, TTS, presented with a mechanical fall, right knee pain. CT knee showed horizontal patellar fracture with 3.4 cm of distraction, tiny avulsion fracture right of the medial aspect of patella. Patient was seen about opiates and underwent ORIF 03/11/2017.  Assessment & Plan:   Principal Problem:   Patellar fracture Active Problems:   ESRD on dialysis [facility])   CAD (coronary artery disease)   Hyponatremia   Displaced transverse fracture of right patella, initial encounter for closed fracture  #1 acute right displaced patella fracture Secondary to mechanical fall. Patient status post ORIF of patella 03/11/2017. Pain management per orthopedics. PT/OT. Mobilize. Likely need skilled nursing facility on discharge.  #2 end-stage renal disease on hemodialysis Tuesday Thursday Saturday Patient being followed by nephrology. HD per nephrology.  #3 coronary artery disease stable. Continue current regimen of aspirin and statin.  #4 hyponatremia Improved with hemodialysis. Follow.  #5 hypothyroidism Continue Synthroid.  #6 anemia of chronic disease secondary to end-stage renal disease Stable. Per nephrology.  #7 anxiety Stable. Continue home regimen of Zoloft and Klonopin.     DVT prophylaxis: Heparin/per orthopedics Code Status: Full Family Communication: Updated patient and family at bedside. Disposition Plan: Skilled nursing facility when okay with orthopedics.   Consultants:   Orthopedics: Dr. Lyla Glassing 03/11/2017  Nephrology: Dr. Jonnie Finner 03/12/2017  Procedures:   CT right knee 03/10/2017  Plain films of the right knee 03/11/2017, 03/10/2017  ORIF right patella 03/11/2017--Dr.Dr Swinteck  Antimicrobials:   None   Subjective: Patient walking in the room  with a walker and working with physical therapy. Patient denies any shortness of breath. No chest pain.  Objective: Vitals:   03/12/17 2103 03/13/17 0405 03/13/17 0859 03/13/17 1533  BP: (!) 155/38 (!) 154/52 (!) 148/50 (!) 153/57  Pulse: 67 78 75 65  Resp:  16  16  Temp: 99.7 F (37.6 C) 98.7 F (37.1 C)  98.3 F (36.8 C)  TempSrc: Oral   Oral  SpO2: 98% 96%  92%  Weight:  85.5 kg (188 lb 9.6 oz)    Height:        Intake/Output Summary (Last 24 hours) at 03/13/17 2039 Last data filed at 03/13/17 1606  Gross per 24 hour  Intake              440 ml  Output                0 ml  Net              440 ml   Filed Weights   03/12/17 1543 03/12/17 1843 03/13/17 0405  Weight: 86.5 kg (190 lb 11.2 oz) 85.5 kg (188 lb 7.9 oz) 85.5 kg (188 lb 9.6 oz)    Examination:  General exam: Appears calm and comfortable  Respiratory system: Clear to auscultation. Respiratory effort normal. Cardiovascular system: S1 & S2 heard, RRR. No JVD, murmurs, rubs, gallops or clicks. No pedal edema. Gastrointestinal system: Abdomen is nondistended, soft and nontender. No organomegaly or masses felt. Normal bowel sounds heard. Central nervous system: Alert and oriented. No focal neurological deficits. Extremities: Symmetric 5 x 5 power. Skin: No rashes, lesions or ulcers Psychiatry: Judgement and insight appear normal. Mood & affect appropriate.     Data Reviewed: I have personally reviewed following labs and imaging  studies  CBC:  Recent Labs Lab 03/10/17 1847 03/11/17 0338 03/12/17 0543 03/13/17 0602  WBC 9.6 7.5 10.4 7.5  NEUTROABS  --  5.7  --   --   HGB 9.9* 8.8* 9.0* 8.0*  HCT 31.7* 28.2* 28.4* 25.7*  MCV 96.4 97.2 96.6 96.3  PLT 219 238 235 098   Basic Metabolic Panel:  Recent Labs Lab 03/10/17 1847 03/11/17 0338 03/12/17 0543 03/13/17 0602  NA 134* 136 132* 132*  K 4.6 5.2* 5.5* 4.2  CL 98* 98* 95* 95*  CO2 25 27 25 27   GLUCOSE 110* 74 94 94  BUN 16 18 28* 13    CREATININE 5.33* 6.24* 7.53* 4.76*  CALCIUM 8.5* 8.3* 8.5* 8.4*   GFR: Estimated Creatinine Clearance: 12.2 mL/min (A) (by C-G formula based on SCr of 4.76 mg/dL (H)). Liver Function Tests:  Recent Labs Lab 03/10/17 1847 03/11/17 0338  AST 18 29  ALT 16 15  ALKPHOS 69 63  BILITOT 0.6 1.0  PROT 6.1* 5.2*  ALBUMIN 3.3* 3.0*   No results for input(s): LIPASE, AMYLASE in the last 168 hours. No results for input(s): AMMONIA in the last 168 hours. Coagulation Profile:  Recent Labs Lab 03/10/17 1847  INR 0.99   Cardiac Enzymes: No results for input(s): CKTOTAL, CKMB, CKMBINDEX, TROPONINI in the last 168 hours. BNP (last 3 results) No results for input(s): PROBNP in the last 8760 hours. HbA1C: No results for input(s): HGBA1C in the last 72 hours. CBG: No results for input(s): GLUCAP in the last 168 hours. Lipid Profile: No results for input(s): CHOL, HDL, LDLCALC, TRIG, CHOLHDL, LDLDIRECT in the last 72 hours. Thyroid Function Tests: No results for input(s): TSH, T4TOTAL, FREET4, T3FREE, THYROIDAB in the last 72 hours. Anemia Panel: No results for input(s): VITAMINB12, FOLATE, FERRITIN, TIBC, IRON, RETICCTPCT in the last 72 hours. Sepsis Labs: No results for input(s): PROCALCITON, LATICACIDVEN in the last 168 hours.  Recent Results (from the past 240 hour(s))  Surgical pcr screen     Status: None   Collection Time: 03/10/17 10:37 PM  Result Value Ref Range Status   MRSA, PCR NEGATIVE NEGATIVE Final   Staphylococcus aureus NEGATIVE NEGATIVE Final    Comment: (NOTE) The Xpert SA Assay (FDA approved for NASAL specimens in patients 38 years of age and older), is one component of a comprehensive surveillance program. It is not intended to diagnose infection nor to guide or monitor treatment.          Radiology Studies: No results found.      Scheduled Meds: . amLODipine  10 mg Oral QHS  . aspirin EC  81 mg Oral BID WC  . atorvastatin  80 mg Oral q1800  .  [START ON 03/14/2017] darbepoetin (ARANESP) injection - DIALYSIS  60 mcg Intravenous Q Thu-HD  . [START ON 03/14/2017] doxercalciferol  1 mcg Intravenous Q T,Th,Sa-HD  . folic acid  1 mg Oral Daily  . heparin subcutaneous  5,000 Units Subcutaneous Q8H  . levothyroxine  175 mcg Oral QAC breakfast  . mouth rinse  15 mL Mouth Rinse BID  . metoprolol tartrate  50 mg Oral BID  . multivitamin  1 tablet Oral QHS  . senna-docusate  1 tablet Oral BID  . sertraline  100 mg Oral Daily  . sevelamer carbonate  4,000 mg Oral TID WC  . sodium bicarbonate  650 mg Oral BID  . sodium chloride flush  3 mL Intravenous Q12H   Continuous Infusions: . methocarbamol (ROBAXIN)  IV  500 mg (03/12/17 1808)     LOS: 3 days    Time spent: 52 minutes    Faith Patricelli, MD [facility] Pager [phone]  If 7PM-7AM, please contact night-coverage [email or web address] Password [facility] 03/13/2017, 8:39 PM

## 2017-03-13 NOTE — Clinical Social Work Note (Signed)
Clinical Social Work Assessment  Patient Details  Name: Sherry Cox MRN: [id number] Date of Birth: 12-17-1960  Date of referral:  03/13/17               Reason for consult:  Facility Placement                Permission sought to share information with:  Facility Art therapist granted to share information::  Yes, Verbal Permission Granted  Name::        Agency::  SNFs  Relationship::     Contact Information:     Housing/Transportation Living arrangements for the past 2 months:  Single Family Home Source of Information:  Patient Patient Interpreter Needed:  None Criminal Activity/Legal Involvement Pertinent to Current Situation/Hospitalization:  No - Comment as needed Significant Relationships:  Spouse Lives with:  Spouse Do you feel safe going back to the place where you live?  No Need for family participation in patient care:  No (Coment)  Care giving concerns:  CSW received consult for possible SNF placement at time of discharge. CSW spoke with patient regarding PT recommendation of SNF placement at time of discharge. Patient reported that patient's spouse is currently unable to care for patient at their home given patient's current physical needs and fall risk. Patient expressed understanding of PT recommendation and is agreeable to SNF placement at time of discharge. CSW to continue to follow and assist with discharge planning needs.   Social Worker assessment / plan:  CSW spoke with patient concerning possibility of rehab at [facility] before returning home.  Employment status:  Unemployed Forensic scientist:  Medicare PT Recommendations:  [place] / Referral to community resources:  [place]  Patient/Family's Response to care:  Patient recognizes need for rehab before returning home and is agreeable to a SNF in [place] since she has dialysis in [place].  Patient/Family's Understanding of and Emotional  Response to Diagnosis, Current Treatment, and Prognosis:  Patient/family is realistic regarding therapy needs and expressed being hopeful for SNF placement. Patient expressed understanding of CSW role and discharge process as well as medical condition. No questions/concerns about plan or treatment.    Emotional Assessment Appearance:  Appears stated age Attitude/Demeanor/Rapport:  Other (Appropriate) Affect (typically observed):  Accepting, Appropriate Orientation:  Oriented to Self, Oriented to Situation, Oriented to Place, Oriented to  Time Alcohol / Substance use:  Not Applicable Psych involvement (Current and /or in the community):  No (Comment)  Discharge Needs  Concerns to be addressed:  Care Coordination Readmission within the last 30 days:  No Current discharge risk:  None Barriers to Discharge:  Continued Medical Work up   [employer], [place] 03/13/2017, 2:37 PM

## 2017-03-13 NOTE — Progress Notes (Signed)
Physical Therapy Treatment Patient Details Name: Sherry Cox MRN: [id number] DOB: 1961-01-05 Today's Date: 03/13/2017    History of Present Illness [name] a 56 y.o.female,CAD s/p CABG, carotid stenosis, w ESRD on HD (T, T, S), no hx of osteoporosis, apparently c/o Fall onto a divider for a screen door on the ground where she hurt her knee cap. Pt found to have a patella fx. Pt s/p ORIF of R patella 9/3.    PT Comments    Pt with very slow progress towards goals. Required increased assist for bed mobility and continues to be limited in ambulation tolerance secondary to pain and fatigue. Pt reporting she does not feel safe to go home at this point. Discussed SNF with pt and pt agreeable. Recommendations updated. Will continue to follow acutely and progress mobility according to pt tolerance.    Follow Up Recommendations  SNF     Equipment Recommendations  Rolling walker with 5" wheels;3in1 (PT) (YOUTH RW, tub bench)    Recommendations for Other Services       Precautions / Restrictions Precautions Precautions: Fall Precaution Comments: R KI on at all times, no knee flexion Required Braces or Orthoses: Knee Immobilizer - Right Knee Immobilizer - Right: On at all times Restrictions Weight Bearing Restrictions: Yes RLE Weight Bearing: Weight bearing as tolerated    Mobility  Bed Mobility Overal bed mobility: Needs Assistance Bed Mobility: Supine to Sit     Supine to sit: Mod assist;HOB elevated     General bed mobility comments: Mod A for RLE management and assist with trunk elevation. Increased difficulty moving this session.   Transfers Overall transfer level: Needs assistance Equipment used: Rolling walker (2 wheeled) Transfers: Sit to/from Stand Sit to Stand: Min assist         General transfer comment: Min A for lift assist. Verbal cues for hand placement.   Ambulation/Gait Ambulation/Gait assistance: Min guard Ambulation Distance (Feet):  20 Feet Assistive device: Rolling walker (2 wheeled) Gait Pattern/deviations: Step-to pattern Gait velocity: slow Gait velocity interpretation: Below normal speed for age/gender General Gait Details: Very Slow, antalgic gait. Very limited tolerance secondary to fatigue and RLE pain. Verbal cues for sequencing.    Stairs            Wheelchair Mobility    Modified Rankin (Stroke Patients Only)       Balance Overall balance assessment: Needs assistance Sitting-balance support: No upper extremity supported;Feet supported Sitting balance-Leahy Scale: Fair     Standing balance support: During functional activity;Bilateral upper extremity supported Standing balance-Leahy Scale: Poor Standing balance comment: Use of RW for balance                             Cognition Arousal/Alertness: Awake/alert Behavior During Therapy: WFL for tasks assessed/performed Overall Cognitive Status: Within Functional Limits for tasks assessed                                        Exercises      General Comments General comments (skin integrity, edema, etc.): Pt reports she does not feel safe to go home given mobility limitations. Educated about SNF and pt agreeable. Recommendations updated.       Pertinent Vitals/Pain Pain Assessment: Faces Faces Pain Scale: Hurts even more Pain Location: R knee Pain Descriptors / Indicators: Spasm Pain Intervention(s): Limited activity within patient's tolerance;Monitored  during session;Repositioned    Home Living                      Prior Function            PT Goals (current goals can now be found in the care plan section) Acute Rehab PT Goals Patient Stated Goal: stop the spasms PT Goal Formulation: With patient Time For Goal Achievement: 03/19/17 Potential to Achieve Goals: Good Progress towards PT goals: Progressing toward goals (slowly )    Frequency    Min 3X/week      PT Plan Discharge  plan needs to be updated;Frequency needs to be updated    Co-evaluation              AM-PAC PT "6 Clicks" Daily Activity  Outcome Measure  Difficulty turning over in bed (including adjusting bedclothes, sheets and blankets)?: Unable Difficulty moving from lying on back to sitting on the side of the bed? : Unable Difficulty sitting down on and standing up from a chair with arms (e.g., wheelchair, bedside commode, etc,.)?: Unable Help needed moving to and from a bed to chair (including a wheelchair)?: A Little Help needed walking in hospital room?: A Little Help needed climbing 3-5 steps with a railing? : A Lot 6 Click Score: 11    End of Session Equipment Utilized During Treatment: Gait belt;Right knee immobilizer Activity Tolerance: No increased pain;Patient limited by fatigue Patient left: in bed;with call bell/phone within reach;with bed alarm set Nurse Communication: Mobility status PT Visit Diagnosis: Unsteadiness on feet (R26.81);History of falling (Z91.81);Difficulty in walking, not elsewhere classified (R26.2);Pain Pain - Right/Left: Right Pain - part of body: Knee     Time: 1201-1233 PT Time Calculation (min) (ACUTE ONLY): 32 min  Charges:  $Gait Training: 23-37 mins                    G Codes:       Leighton Ruff, PT, DPT  Acute Rehabilitation Services  Pager: [phone]    Rudean Hitt 03/13/2017, 1:09 PM

## 2017-03-13 NOTE — NC FL2 (Signed)
[place] MEDICAID FL2 LEVEL OF CARE SCREENING TOOL     IDENTIFICATION  Patient Name: Sherry Cox Birthdate: 05/26/1961 Sex: female Admission Date (Current Location): 03/10/2017  [facility] and [place] Number:  [employer] and Address:  The [place]. [facility], [place] [address] [place], [place] [address]      Provider Number: [id number]  Attending Physician Name and Address:  Eugenie Filler, MD  Relative Name and Phone Number:       Current Level of Care: Hospital Recommended Level of Care: [place] Prior Approval Number:    Date Approved/Denied:   PASRR Number: [id number]  Discharge Plan: SNF    Current Diagnoses: Patient Active Problem List   Diagnosis Date Noted  . Displaced transverse fracture of right patella, initial encounter for closed fracture 03/11/2017  . Patellar fracture 03/10/2017  . Hyponatremia 03/10/2017  . Flow murmur 12/22/2014  . Tobacco abuse 12/11/2013  . 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 Occlusion and stenosis of carotid artery without mention of cerebral infarction 08/30/2011  . Ejection fraction   . CAD (coronary artery disease)   . Carotid artery stenosis   . Hyperlipidemia   . ESRD on dialysis ([facility])   . Hx of tobacco use, presenting hazards to health   . Hx of CABG   . HYPOTHYROIDISM 09/26/2009  . Overweight(278.02) 06/09/2009  . ANXIETY 06/09/2009    Orientation RESPIRATION BLADDER Height & Weight     Self, Time, Situation, Place  O2 (Nasal cannula 2L) Incontinent, External catheter Weight: 85.5 kg (188 lb 9.6 oz) Height:  4\' 10"  (147.3 cm)  BEHAVIORAL SYMPTOMS/MOOD NEUROLOGICAL BOWEL NUTRITION STATUS      Continent Diet (Please see DC Summary)  AMBULATORY STATUS COMMUNICATION OF NEEDS Skin   Limited Assist Verbally Surgical wounds (Closed incision on knee)                       Personal Care Assistance Level of Assistance  Bathing, Feeding, Dressing Bathing  Assistance: Limited assistance Feeding assistance: Independent Dressing Assistance: Limited assistance     Functional Limitations Info             SPECIAL CARE FACTORS FREQUENCY  PT (By licensed PT)     PT Frequency: 5x/week              Contractures      Additional Factors Info  Code Status, Allergies, Psychotropic Code Status Info: Full Allergies Info: Augmentin Amoxicillin-pot Clavulanate Psychotropic Info: Zoloft         Current Medications (03/13/2017):  This is the current hospital active medication list Current Facility-Administered Medications  Medication Dose Route Frequency Provider Last Rate Last Dose  . acetaminophen (TYLENOL) tablet 650 mg  650 mg Oral Q6H PRN Swinteck, Aaron Edelman, MD       Or  . acetaminophen (TYLENOL) suppository 650 mg  650 mg Rectal Q6H PRN Swinteck, Aaron Edelman, MD      . albuterol (PROVENTIL) (2.5 MG/3ML) 0.083% nebulizer solution 3 mL  3 mL Inhalation Q4H PRN Jani Gravel, MD      . amLODipine (NORVASC) tablet 10 mg  10 mg Oral Loma Sousa, MD   10 mg at 03/11/17 2110  . aspirin EC tablet 81 mg  81 mg Oral BID WC Rod Can, MD   81 mg at 03/13/17 0858  . atorvastatin (LIPITOR) tablet 80 mg  80 mg Oral q1800 Jani Gravel, MD   80 mg at 03/12/17 2107  .  clonazePAM (KLONOPIN) tablet 0.5 mg  0.5 mg Oral TID PRN Jani Gravel, MD   0.5 mg at 03/11/17 2341  . [START ON 03/14/2017] Darbepoetin Alfa (ARANESP) injection 60 mcg  60 mcg Intravenous Q Thu-HD Zeyfang, David, PA-C      . diphenhydrAMINE (BENADRYL) injection 25 mg  25 mg Intravenous Q6H PRN Jani Gravel, MD   25 mg at 03/11/17 2117  . [START ON 03/14/2017] doxercalciferol (HECTOROL) injection 1 mcg  1 mcg Intravenous Q T,Th,Sa-HD Ernest Haber, PA-C   1 mcg at 03/12/17 1808  . folic acid (FOLVITE) tablet 1 mg  1 mg Oral Daily Jani Gravel, MD   1 mg at 03/13/17 0859  . heparin injection 5,000 Units  5,000 Units Subcutaneous Q8H Rai, Ripudeep K, MD   5,000 Units at 03/13/17 1321  .  HYDROcodone-acetaminophen (NORCO/VICODIN) 5-325 MG per tablet 1-2 tablet  1-2 tablet Oral Q4H PRN Rod Can, MD   2 tablet at 03/13/17 1321  . HYDROmorphone (DILAUDID) injection 0.5-1 mg  0.5-1 mg Intravenous Q2H PRN Rod Can, MD   1 mg at 03/13/17 0911  . levothyroxine (SYNTHROID, LEVOTHROID) tablet 175 mcg  175 mcg Oral QAC breakfast Jani Gravel, MD   175 mcg at 03/13/17 [phone]  . MEDLINE mouth rinse  15 mL Mouth Rinse BID Rai, Ripudeep K, MD      . methocarbamol (ROBAXIN) 500 mg in dextrose 5 % 50 mL IVPB  500 mg Intravenous Q6H PRN Rod Can, MD 110 mL/hr at 03/12/17 1808 500 mg at 03/12/17 1808  . metoCLOPramide (REGLAN) tablet 5-10 mg  5-10 mg Oral Q8H PRN Swinteck, Aaron Edelman, MD       Or  . metoCLOPramide (REGLAN) injection 5-10 mg  5-10 mg Intravenous Q8H PRN Swinteck, Aaron Edelman, MD      . metoprolol tartrate (LOPRESSOR) tablet 50 mg  50 mg Oral BID Jani Gravel, MD   50 mg at 03/13/17 0900  . multivitamin (RENA-VIT) tablet 1 tablet  1 tablet Oral QHS Jani Gravel, MD   1 tablet at 03/12/17 2107  . nitroGLYCERIN (NITROSTAT) SL tablet 0.4 mg  0.4 mg Sublingual Q5 min PRN Jani Gravel, MD      . ondansetron [facility]) injection 4 mg  4 mg Intravenous Q6H PRN Jani Gravel, MD   4 mg at 03/13/17 8182  . polyethylene glycol (MIRALAX / GLYCOLAX) packet 17 g  17 g Oral Daily PRN Rai, Ripudeep K, MD      . senna-docusate (Senokot-S) tablet 1 tablet  1 tablet Oral BID Rai, Ripudeep K, MD   1 tablet at 03/13/17 0859  . sertraline (ZOLOFT) tablet 100 mg  100 mg Oral Daily Jani Gravel, MD   100 mg at 03/13/17 0900  . sevelamer carbonate (RENVELA) tablet 1,600 mg  1,600 mg Oral PRN Jani Gravel, MD      . sevelamer carbonate (RENVELA) tablet 4,000 mg  4,000 mg Oral TID WC Jani Gravel, MD   4,000 mg at 03/13/17 1245  . sodium bicarbonate tablet 650 mg  650 mg Oral BID Jani Gravel, MD   650 mg at 03/13/17 0859  . sodium chloride (OCEAN) 0.65 % nasal spray 1 spray  1 spray Each Nare PRN Jani Gravel, MD      .  sodium chloride flush (NS) 0.9 % injection 3 mL  3 mL Intravenous Q12H Jani Gravel, MD   3 mL at 03/13/17 0901  . sodium chloride flush (NS) 0.9 % injection 3 mL  3 mL Intravenous PRN Maudie Mercury,  Jeneen Rinks, MD         Discharge Medications: Please see discharge summary for a list of discharge medications.  Relevant Imaging Results:  Relevant Lab Results:   Additional Information SSN: [id number]  Gets dialysis at [place]. Has pravena wound vac.   Benard Halsted, LCSWA

## 2017-03-13 NOTE — Progress Notes (Signed)
Subjective:  tolerated Hd yest. Postop pain minimal   Objective Vital signs in last 24 hours: Vitals:   03/12/17 1843 03/12/17 2103 03/13/17 0405 03/13/17 0859  BP: (!) 158/62 (!) 155/38 (!) 154/52 (!) 148/50  Pulse: 66 67 78 75  Resp: 16  16   Temp: 98 F (36.7 C) 99.7 F (37.6 C) 98.7 F (37.1 C)   TempSrc: Oral Oral    SpO2: 95% 98% 96%   Weight: 85.5 kg (188 lb 7.9 oz)  85.5 kg (188 lb 9.6 oz)   Height:       Weight change: -0.5 kg (-1 lb 1.6 oz)  Physical Exam: General: alert WD WN ,WF  NAD  Heart: RRR  No mur , rub or gallop Lungs: CTA  Anteriorly  Unlabored breathing  Abdomen: Obese soft , Nt , ND Extremities: No  Pedal edema / R lower leg in brace Dialysis Access: Pos bruit LUA AVF with mild aneurysmal  Formations   Dialysis Orders: Center: [facility]   on TTS . EDW 80.5 kg HD Bath 2k 2.5 ca  Time 3hrs Heparin 2,000 . Access LUA AVF BFR 400 DFR 600    Hec. 1 mcg IV/HD Epogen 32,000   Units IV/HD    Problem/Plan: 1. Displaced right patella fracture/ sp ORIF 03/11/17 - Tx  per Ortho/Admit  2. ESRD -TTS HD.  HD tomorrow. .  3. Hypertension/volume  -   1 Liter uf  yest tolerated / bp 154/52  per bed wt ^ 5 l  tomor uf with hd / need stand wt  When ORTHO OKs standing / on  Home bp meds =Amlodipine 10 mg q day  / Lopressor 50 mg bid  4. Anemia  -hgb  Sl drop post op 9.9 >8.0 give    EPO on hd  q Thurs Aranesp monitor HGB trend post op  5. Metabolic bone disease -  Hec. On hd / Renvela as binder  6. CAD - Asymp , On statin , Aspirin   Admit team  RX    Ernest Haber, PA-C [facility]per [phone] 03/13/2017,9:02 AM  LOS: 3 days   Pt seen, examined and agree w A/P as above.  Kelly Splinter MD [facility] pager [phone]   03/14/2017, 11:46 AM    Labs: Basic Metabolic Panel:  Recent Labs Lab 03/11/17 0338 03/12/17 0543 03/13/17 0602  NA 136 132* 132*  K 5.2* 5.5* 4.2  CL 98* 95* 95*  CO2 27 25 27   GLUCOSE 74 94 94  BUN  18 28* 13  CREATININE 6.24* 7.53* 4.76*  CALCIUM 8.3* 8.5* 8.4*   Liver Function Tests:  Recent Labs Lab 03/10/17 1847 03/11/17 0338  AST 18 29  ALT 16 15  ALKPHOS 69 63  BILITOT 0.6 1.0  PROT 6.1* 5.2*  ALBUMIN 3.3* 3.0*   No results for input(s): LIPASE, AMYLASE in the last 168 hours. No results for input(s): AMMONIA in the last 168 hours. CBC:  Recent Labs Lab 03/10/17 1847 03/11/17 0338 03/12/17 0543 03/13/17 0602  WBC 9.6 7.5 10.4 7.5  NEUTROABS  --  5.7  --   --   HGB 9.9* 8.8* 9.0* 8.0*  HCT 31.7* 28.2* 28.4* 25.7*  MCV 96.4 97.2 96.6 96.3  PLT 219 238 235 186   Cardiac Enzymes: No results for input(s): CKTOTAL, CKMB, CKMBINDEX, TROPONINI in the last 168 hours. CBG: No results for input(s): GLUCAP in the last 168 hours.  Medications: . methocarbamol (ROBAXIN)  IV  500 mg (03/12/17 1808)   . amLODipine  10 mg Oral QHS  . aspirin EC  81 mg Oral BID WC  . atorvastatin  80 mg Oral q1800  . [START ON 03/14/2017] darbepoetin (ARANESP) injection - DIALYSIS  60 mcg Intravenous Q Thu-HD  . [START ON 03/14/2017] doxercalciferol  1 mcg Intravenous Q T,Th,Sa-HD  . folic acid  1 mg Oral Daily  . heparin subcutaneous  5,000 Units Subcutaneous Q8H  . levothyroxine  175 mcg Oral QAC breakfast  . mouth rinse  15 mL Mouth Rinse BID  . metoprolol tartrate  50 mg Oral BID  . multivitamin  1 tablet Oral QHS  . senna-docusate  1 tablet Oral BID  . sertraline  100 mg Oral Daily  . sevelamer carbonate  4,000 mg Oral TID WC  . sodium bicarbonate  650 mg Oral BID  . sodium chloride flush  3 mL Intravenous Q12H

## 2017-03-14 LAB — RENAL FUNCTION PANEL
ALBUMIN: 2.8 g/dL — AB (ref 3.5–5.0)
ANION GAP: 11 (ref 5–15)
BUN: 22 mg/dL — ABNORMAL HIGH (ref 6–20)
CHLORIDE: 91 mmol/L — AB (ref 101–111)
CO2: 28 mmol/L (ref 22–32)
Calcium: 8.6 mg/dL — ABNORMAL LOW (ref 8.9–10.3)
Creatinine, Ser: 6.19 mg/dL — ABNORMAL HIGH (ref 0.44–1.00)
GFR, EST AFRICAN AMERICAN: 8 mL/min — AB (ref >60)
GFR, EST NON AFRICAN AMERICAN: 7 mL/min — AB (ref >60)
Glucose, Bld: 89 mg/dL (ref 65–99)
PHOSPHORUS: 7 mg/dL — AB (ref 2.5–4.6)
POTASSIUM: 4.4 mmol/L (ref 3.5–5.1)
Sodium: 130 mmol/L — ABNORMAL LOW (ref 135–145)

## 2017-03-14 LAB — CBC
HCT: 24.5 % — ABNORMAL LOW (ref 36.0–46.0)
Hemoglobin: 7.8 g/dL — ABNORMAL LOW (ref 12.0–15.0)
MCH: 30.7 pg (ref 26.0–34.0)
MCHC: 31.8 g/dL (ref 30.0–36.0)
MCV: 96.5 fL (ref 78.0–100.0)
PLATELETS: 177 10*3/uL (ref 150–400)
RBC: 2.54 MIL/uL — AB (ref 3.87–5.11)
RDW: 17.9 % — ABNORMAL HIGH (ref 11.5–15.5)
WBC: 7.6 10*3/uL (ref 4.0–10.5)

## 2017-03-14 MED ORDER — DOXERCALCIFEROL 4 MCG/2ML IV SOLN
INTRAVENOUS | Status: AC
  Administered 2017-03-14: 1 ug via INTRAVENOUS
  Filled 2017-03-14: qty 2

## 2017-03-14 MED ORDER — DARBEPOETIN ALFA 60 MCG/0.3ML IJ SOSY
PREFILLED_SYRINGE | INTRAMUSCULAR | Status: AC
  Administered 2017-03-14: 60 ug via INTRAVENOUS
  Filled 2017-03-14: qty 0.3

## 2017-03-14 MED ORDER — HYDROCODONE-ACETAMINOPHEN 5-325 MG PO TABS
ORAL_TABLET | ORAL | Status: AC
  Administered 2017-03-14: 1 via ORAL
  Filled 2017-03-14: qty 2

## 2017-03-14 NOTE — Progress Notes (Addendum)
Subjective:  No problems today, on HD now.  Objective Vital signs in last 24 hours: Vitals:   03/14/17 1000 03/14/17 1030 03/14/17 1100 03/14/17 1106  BP: (!) 132/49 (!) 142/47 (!) 141/53 (!) 148/57  Pulse: (!) 49 (!) 48 (!) 50 60  Resp:      Temp:    98 F (36.7 C)  TempSrc:    Oral  SpO2:    100%  Weight:    84.1 kg (185 lb 6.5 oz)  Height:       Weight change: -0.271 kg (-9.6 oz)  Physical Exam: General: alert WD WN ,WF  NAD  Heart: RRR  No mur , rub or gallop Lungs: CTA  Anteriorly  Unlabored breathing  Abdomen: Obese soft , Nt , ND Extremities: No  Pedal edema / R lower leg in brace Dialysis Access: Pos bruit LUA AVF with mild aneurysmal  Formations   Dialysis:  [facility]   3h  80.5kg   Hep 2000   LUA AVf - hectorol 1 ug - epogen 32,000 tiw   Problems: 1. Displaced right patella fracture - sp ORIF 03/11/17 - Tx  per Ortho/Admit  2. ESRD -TTS HD.  HD tomorrow. 3. Hypertension/volume  -   1 Liter uf  yest tolerated / bp 154/52  per bed wt ^ 5 l  tomor uf with hd / need stand wt  When ORTHO OKs standing / on  Home bp meds =Amlodipine 10 mg q day  / Lopressor 50 mg bid  4. Anemia  -hgb  Sl drop post op 9.9 >8.0 give    EPO on hd  q Thurs Aranesp monitor HGB trend post op  5. Metabolic bone disease -  Hec. On hd / Renvela as binder  6. CAD - Asymp , On statin , Aspirin   Admit team  RX    Plan - next HD Sat  Kelly Splinter MD [facility] pager [phone]   03/14/2017, 11:47 AM    Labs: Basic Metabolic Panel:  Recent Labs Lab 03/12/17 0543 03/13/17 0602 03/14/17 0544  NA 132* 132* 130*  K 5.5* 4.2 4.4  CL 95* 95* 91*  CO2 25 27 28   GLUCOSE 94 94 89  BUN 28* 13 22*  CREATININE 7.53* 4.76* 6.19*  CALCIUM 8.5* 8.4* 8.6*  PHOS  --   --  7.0*   Liver Function Tests:  Recent Labs Lab 03/10/17 1847 03/11/17 0338 03/14/17 0544  AST 18 29  --   ALT 16 15  --   ALKPHOS 69 63  --   BILITOT 0.6 1.0  --   PROT 6.1* 5.2*  --   ALBUMIN  3.3* 3.0* 2.8*   No results for input(s): LIPASE, AMYLASE in the last 168 hours. No results for input(s): AMMONIA in the last 168 hours. CBC:  Recent Labs Lab 03/10/17 1847 03/11/17 0338 03/12/17 0543 03/13/17 0602 03/14/17 0544  WBC 9.6 7.5 10.4 7.5 7.6  NEUTROABS  --  5.7  --   --   --   HGB 9.9* 8.8* 9.0* 8.0* 7.8*  HCT 31.7* 28.2* 28.4* 25.7* 24.5*  MCV 96.4 97.2 96.6 96.3 96.5  PLT 219 238 235 186 177   Cardiac Enzymes: No results for input(s): CKTOTAL, CKMB, CKMBINDEX, TROPONINI in the last 168 hours. CBG: No results for input(s): GLUCAP in the last 168 hours.  Medications: . methocarbamol (ROBAXIN)  IV 500 mg (03/12/17 1808)   . amLODipine  10 mg Oral QHS  .  aspirin EC  81 mg Oral BID WC  . atorvastatin  80 mg Oral q1800  . darbepoetin (ARANESP) injection - DIALYSIS  60 mcg Intravenous Q Thu-HD  . doxercalciferol  1 mcg Intravenous Q T,Th,Sa-HD  . folic acid  1 mg Oral Daily  . heparin subcutaneous  5,000 Units Subcutaneous Q8H  . levothyroxine  175 mcg Oral QAC breakfast  . mouth rinse  15 mL Mouth Rinse BID  . metoprolol tartrate  50 mg Oral BID  . multivitamin  1 tablet Oral QHS  . senna-docusate  1 tablet Oral BID  . sertraline  100 mg Oral Daily  . sevelamer carbonate  4,000 mg Oral TID WC  . sodium bicarbonate  650 mg Oral BID  . sodium chloride flush  3 mL Intravenous Q12H

## 2017-03-14 NOTE — Progress Notes (Signed)
   03/14/17 0800  OT Visit Information  Last OT Received On 03/14/17  Reason Eval/Treat Not Completed Patient at procedure or test/ unavailable (HD)   Will reattempt OT evaluation as able.  Tyrone Schimke OTR/L Pager: [phone]

## 2017-03-14 NOTE — Care Management Note (Signed)
Case Management Note  Patient Details  Name: Sherry Cox Date MRN: [id number] Date of Birth: 1961-06-01  Subjective/Objective:   Presented with a mechanical fall, right knee pain. Hx of  CAD/  CABG, carotid stenosis, ESRD on HD, TTS. HOME DME: cane and oxygen. From home with husband.    s/p ORIF of R patella 03/11/2017            Geraldy Akridge (Spouse)     [phone]            PCP: Dr.Tapper/ [facility]  Action/Plan: Plan is to d/c to home when medically stable. Pt refused to be placed in SNF per PT's  Recommendation. Pt does have transportation to home: husband.  Expected Discharge Date:       03/15/2017           Expected Discharge Plan:  [place]  In-House Referral:  Clinical Social Work (refused  SNF placement)  Discharge planning Services  CM Consult  Post Acute Care Choice:    Choice offered to:  Patient  DME Arranged:    home oxygen, pt active with DME Agency:     HH Arranged:   PT,OT,SW, Nurse Aide [place] Agency:   [facility], referral made pending MD's order. MD aware of need.  Status of Service:  In process, will continue to follow  If discussed at Long Length of Stay Meetings, dates discussed:    Additional Comments:  Sharin Mons, RN 03/14/2017, 2:07 PM

## 2017-03-14 NOTE — Progress Notes (Signed)
PROGRESS NOTE    [name]  VHQ:[id number] DOB: 06/30/1961 DOA: 03/10/2017 PCP: Sherry Cox, No Pcp Per   Brief Narrative:  Sherry Cox is a 56 year old female with CAD status post CABG, carotid stenosis, ESRD on HD, TTS, presented with a mechanical fall, right knee pain. CT knee showed horizontal patellar fracture with 3.4 cm of distraction, tiny avulsion fracture right of the medial aspect of patella. Sherry Cox was seen about opiates and underwent ORIF 03/11/2017.  Assessment & Plan:   Principal Problem:   Patellar fracture Active Problems:   ESRD on dialysis [facility])   CAD (coronary artery disease)   Hyponatremia   Displaced transverse fracture of right patella, initial encounter for closed fracture  #1 acute right displaced patella fracture Secondary to mechanical fall. Sherry Cox status post ORIF of patella 03/11/2017. Pain management per orthopedics. PT/OT. Mobilize. Likely need skilled nursing facility on discharge, however Sherry Cox wants to go home with home health.  #2 end-stage renal disease on hemodialysis Tuesday Thursday Saturday Sherry Cox just returned from hemodialysis today. Stable. Per nephrology.   #3 coronary artery disease Continue current regimen of aspirin and statin.  #4 hyponatremia Improved with hemodialysis. Follow.  #5 hypothyroidism Continue Synthroid.  #6 anemia of chronic disease secondary to end-stage renal disease Stable. Per nephrology.  #7 anxiety Continue home regimen of Zoloft and Klonopin.     DVT prophylaxis: Heparin/per orthopedics Code Status: Full Family Communication: Updated Sherry Cox. No family at bedside. Disposition Plan: Home with home health when okay with orthopedics.   Consultants:   Orthopedics: Dr. Lyla Glassing 03/11/2017  Nephrology: Dr. Jonnie Finner 03/12/2017  Procedures:   CT right knee 03/10/2017  Plain films of the right knee 03/11/2017, 03/10/2017  ORIF right patella 03/11/2017--Dr.Dr Swinteck  Antimicrobials:    None   Subjective: Sherry Cox just returning from hemodialysis. No chest pain. No shortness of breath. Sherry Cox states she spoke with her husband and has decided to go home with home health.  Objective: Vitals:   03/14/17 1030 03/14/17 1100 03/14/17 1106 03/14/17 1349  BP: (!) 142/47 (!) 141/53 (!) 148/57 (!) 137/46  Pulse: (!) 48 (!) 50 60 (!) 56  Resp:    16  Temp:   98 F (36.7 C) 98.3 F (36.8 C)  TempSrc:   Oral Oral  SpO2:   100% 95%  Weight:   84.1 kg (185 lb 6.5 oz)   Height:        Intake/Output Summary (Last 24 hours) at 03/14/17 1355 Last data filed at 03/14/17 1106  Gross per 24 hour  Intake              220 ml  Output             2000 ml  Net            -1780 ml   Filed Weights   03/14/17 0500 03/14/17 0801 03/14/17 1106  Weight: 86.2 kg (190 lb 1.6 oz) 86.1 kg (189 lb 13.1 oz) 84.1 kg (185 lb 6.5 oz)    Examination:  General exam: NAD. Respiratory system: Clear to auscultation. Respiratory effort normal. Cardiovascular system: S1 & S2 heard, RRR. No JVD, murmurs, rubs, gallops or clicks. No pedal edema. Gastrointestinal system: Abdomen is Soft, nontender, nondistended, positive bowel sounds.  Central nervous system: Alert and oriented. No focal neurological deficits. Extremities: Symmetric 5 x 5 power. Skin: No rashes, lesions or ulcers Psychiatry: Judgement and insight appear normal. Mood & affect appropriate.     Data Reviewed: I have personally reviewed following labs and  imaging studies  CBC:  Recent Labs Lab 03/10/17 1847 03/11/17 0338 03/12/17 0543 03/13/17 0602 03/14/17 0544  WBC 9.6 7.5 10.4 7.5 7.6  NEUTROABS  --  5.7  --   --   --   HGB 9.9* 8.8* 9.0* 8.0* 7.8*  HCT 31.7* 28.2* 28.4* 25.7* 24.5*  MCV 96.4 97.2 96.6 96.3 96.5  PLT 219 238 235 186 852   Basic Metabolic Panel:  Recent Labs Lab 03/10/17 1847 03/11/17 0338 03/12/17 0543 03/13/17 0602 03/14/17 0544  NA 134* 136 132* 132* 130*  K 4.6 5.2* 5.5* 4.2 4.4  CL 98*  98* 95* 95* 91*  CO2 25 27 25 27 28   GLUCOSE 110* 74 94 94 89  BUN 16 18 28* 13 22*  CREATININE 5.33* 6.24* 7.53* 4.76* 6.19*  CALCIUM 8.5* 8.3* 8.5* 8.4* 8.6*  PHOS  --   --   --   --  7.0*   GFR: Estimated Creatinine Clearance: 9.3 mL/min (A) (by C-G formula based on SCr of 6.19 mg/dL (H)). Liver Function Tests:  Recent Labs Lab 03/10/17 1847 03/11/17 0338 03/14/17 0544  AST 18 29  --   ALT 16 15  --   ALKPHOS 69 63  --   BILITOT 0.6 1.0  --   PROT 6.1* 5.2*  --   ALBUMIN 3.3* 3.0* 2.8*   No results for input(s): LIPASE, AMYLASE in the last 168 hours. No results for input(s): AMMONIA in the last 168 hours. Coagulation Profile:  Recent Labs Lab 03/10/17 1847  INR 0.99   Cardiac Enzymes: No results for input(s): CKTOTAL, CKMB, CKMBINDEX, TROPONINI in the last 168 hours. BNP (last 3 results) No results for input(s): PROBNP in the last 8760 hours. HbA1C: No results for input(s): HGBA1C in the last 72 hours. CBG: No results for input(s): GLUCAP in the last 168 hours. Lipid Profile: No results for input(s): CHOL, HDL, LDLCALC, TRIG, CHOLHDL, LDLDIRECT in the last 72 hours. Thyroid Function Tests: No results for input(s): TSH, T4TOTAL, FREET4, T3FREE, THYROIDAB in the last 72 hours. Anemia Panel: No results for input(s): VITAMINB12, FOLATE, FERRITIN, TIBC, IRON, RETICCTPCT in the last 72 hours. Sepsis Labs: No results for input(s): PROCALCITON, LATICACIDVEN in the last 168 hours.  Recent Results (from the past 240 hour(s))  Surgical pcr screen     Status: None   Collection Time: 03/10/17 10:37 PM  Result Value Ref Range Status   MRSA, PCR NEGATIVE NEGATIVE Final   Staphylococcus aureus NEGATIVE NEGATIVE Final    Comment: (NOTE) The Xpert SA Assay (FDA approved for NASAL specimens in patients 97 years of age and older), is one component of a comprehensive surveillance program. It is not intended to diagnose infection nor to guide or monitor treatment.           Radiology Studies: No results found.      Scheduled Meds: . amLODipine  10 mg Oral QHS  . aspirin EC  81 mg Oral BID WC  . atorvastatin  80 mg Oral q1800  . darbepoetin (ARANESP) injection - DIALYSIS  60 mcg Intravenous Q Thu-HD  . doxercalciferol  1 mcg Intravenous Q T,Th,Sa-HD  . folic acid  1 mg Oral Daily  . heparin subcutaneous  5,000 Units Subcutaneous Q8H  . levothyroxine  175 mcg Oral QAC breakfast  . mouth rinse  15 mL Mouth Rinse BID  . metoprolol tartrate  50 mg Oral BID  . multivitamin  1 tablet Oral QHS  . senna-docusate  1 tablet Oral  BID  . sertraline  100 mg Oral Daily  . sevelamer carbonate  4,000 mg Oral TID WC  . sodium bicarbonate  650 mg Oral BID  . sodium chloride flush  3 mL Intravenous Q12H   Continuous Infusions: . methocarbamol (ROBAXIN)  IV 500 mg (03/12/17 1808)     LOS: 4 days    Time spent: 72 minutes    Nickole Adamek, MD [facility] Pager [phone]  If 7PM-7AM, please contact night-coverage [email or web address] Password [email or web address] 03/14/2017, 1:55 PM

## 2017-03-14 NOTE — Progress Notes (Signed)
CSW received consult regarding PT recommendation of SNF at discharge.  CSW had SNF liason speak with patient. Patient is refusing SNF and states her husband reports that he can take care of her.  CSW signing off.   Percell Locus Johaan Ryser LCSWA [phone]

## 2017-03-15 DIAGNOSIS — S82001S Unspecified fracture of right patella, sequela: Secondary | ICD-10-CM

## 2017-03-15 LAB — CBC WITH DIFFERENTIAL/PLATELET
BASOS ABS: 0 10*3/uL (ref 0.0–0.1)
Basophils Relative: 0 %
Eosinophils Absolute: 0.3 10*3/uL (ref 0.0–0.7)
Eosinophils Relative: 4 %
HCT: 25 % — ABNORMAL LOW (ref 36.0–46.0)
Hemoglobin: 7.8 g/dL — ABNORMAL LOW (ref 12.0–15.0)
LYMPHS PCT: 11 %
Lymphs Abs: 0.9 10*3/uL (ref 0.7–4.0)
MCH: 30.4 pg (ref 26.0–34.0)
MCHC: 31.2 g/dL (ref 30.0–36.0)
MCV: 97.3 fL (ref 78.0–100.0)
MONO ABS: 0.9 10*3/uL (ref 0.1–1.0)
Monocytes Relative: 12 %
Neutro Abs: 5.8 10*3/uL (ref 1.7–7.7)
Neutrophils Relative %: 73 %
PLATELETS: 184 10*3/uL (ref 150–400)
RBC: 2.57 MIL/uL — ABNORMAL LOW (ref 3.87–5.11)
RDW: 17.8 % — AB (ref 11.5–15.5)
WBC: 7.9 10*3/uL (ref 4.0–10.5)

## 2017-03-15 LAB — BASIC METABOLIC PANEL WITH GFR
Anion gap: 9 (ref 5–15)
BUN: 12 mg/dL (ref 6–20)
CO2: 29 mmol/L (ref 22–32)
Calcium: 8.6 mg/dL — ABNORMAL LOW (ref 8.9–10.3)
Chloride: 94 mmol/L — ABNORMAL LOW (ref 101–111)
Creatinine, Ser: 4.75 mg/dL — ABNORMAL HIGH (ref 0.44–1.00)
GFR calc Af Amer: 11 mL/min — ABNORMAL LOW (ref >60)
GFR calc non Af Amer: 9 mL/min — ABNORMAL LOW (ref >60)
Glucose, Bld: 87 mg/dL (ref 65–99)
Potassium: 4.3 mmol/L (ref 3.5–5.1)
Sodium: 132 mmol/L — ABNORMAL LOW (ref 135–145)

## 2017-03-15 MED ORDER — ASPIRIN 81 MG PO TBEC: 81.0000 mg | DELAYED_RELEASE_TABLET | Freq: Two times a day (BID) | ORAL | 0 refills | Status: AC

## 2017-03-15 MED ORDER — PRO-STAT SUGAR FREE PO LIQD
30.0000 mL | Freq: Two times a day (BID) | ORAL | Status: DC
  Administered 2017-03-15: 30 mL via ORAL
  Filled 2017-03-15: qty 30

## 2017-03-15 MED ORDER — METHOCARBAMOL 500 MG PO TABS: 500.0000 mg | ORAL_TABLET | Freq: Three times a day (TID) | ORAL | 0 refills | Status: DC | PRN

## 2017-03-15 MED ORDER — SENNOSIDES-DOCUSATE SODIUM 8.6-50 MG PO TABS: 1.0000 | ORAL_TABLET | Freq: Two times a day (BID) | ORAL | Status: DC

## 2017-03-15 MED ORDER — PRO-STAT SUGAR FREE PO LIQD: 30.0000 mL | Freq: Two times a day (BID) | ORAL | 0 refills | Status: DC

## 2017-03-15 MED ORDER — HYDROCODONE-ACETAMINOPHEN 5-325 MG PO TABS: 1.0000 | ORAL_TABLET | ORAL | 0 refills | Status: DC | PRN

## 2017-03-15 NOTE — Progress Notes (Signed)
[facility] Progress Note   Subjective: No C/Os. Concerned about having needed equipment for home care upon discharge. Very pleasant, NAD. Needs to get OOB.   Objective Vitals:   03/14/17 1349 03/14/17 2147 03/15/17 0518 03/15/17 0925  BP: (!) 137/46 (!) 147/39 (!) 157/38 (!) 159/44  Pulse: (!) 56 (!) 58 (!) 191 62  Resp: 16 18 18    Temp: 98.3 F (36.8 C) 99.1 F (37.3 C) 98.4 F (36.9 C)   TempSrc: Oral Oral Oral   SpO2: 95% 99% (!) 79%   Weight:   83.8 kg (184 lb 11.9 oz)   Height:       Physical Exam General: Obese, very pleasant, NAD Heart: S1,S2, RRR Lungs: CTAB, no WOB Abdomen: Active BS Extremities: R Knee immobilizer in place. Wound vac with scant serosanguinous drainage. No LLE edema.  Dialysis Access: LUA AVF + Bruit.    Additional Objective Labs: Basic Metabolic Panel:  Recent Labs Lab 03/13/17 0602 03/14/17 0544 03/15/17 0557  NA 132* 130* 132*  K 4.2 4.4 4.3  CL 95* 91* 94*  CO2 27 28 29   GLUCOSE 94 89 87  BUN 13 22* 12  CREATININE 4.76* 6.19* 4.75*  CALCIUM 8.4* 8.6* 8.6*  PHOS  --  7.0*  --    Liver Function Tests:  Recent Labs Lab 03/10/17 1847 03/11/17 0338 03/14/17 0544  AST 18 29  --   ALT 16 15  --   ALKPHOS 69 63  --   BILITOT 0.6 1.0  --   PROT 6.1* 5.2*  --   ALBUMIN 3.3* 3.0* 2.8*   No results for input(s): LIPASE, AMYLASE in the last 168 hours. CBC:  Recent Labs Lab 03/11/17 0338 03/12/17 0543 03/13/17 0602 03/14/17 0544 03/15/17 0557  WBC 7.5 10.4 7.5 7.6 7.9  NEUTROABS 5.7  --   --   --  5.8  HGB 8.8* 9.0* 8.0* 7.8* 7.8*  HCT 28.2* 28.4* 25.7* 24.5* 25.0*  MCV 97.2 96.6 96.3 96.5 97.3  PLT 238 235 186 177 184    Studies/Results: No results found. Medications: . methocarbamol (ROBAXIN)  IV 500 mg (03/12/17 1808)   . amLODipine  10 mg Oral QHS  . aspirin EC  81 mg Oral BID WC  . atorvastatin  80 mg Oral q1800  . darbepoetin (ARANESP) injection - DIALYSIS  60 mcg Intravenous Q Thu-HD  .  doxercalciferol  1 mcg Intravenous Q T,Th,Sa-HD  . folic acid  1 mg Oral Daily  . heparin subcutaneous  5,000 Units Subcutaneous Q8H  . levothyroxine  175 mcg Oral QAC breakfast  . mouth rinse  15 mL Mouth Rinse BID  . metoprolol tartrate  50 mg Oral BID  . multivitamin  1 tablet Oral QHS  . senna-docusate  1 tablet Oral BID  . sertraline  100 mg Oral Daily  . sevelamer carbonate  4,000 mg Oral TID WC  . sodium bicarbonate  650 mg Oral BID  . sodium chloride flush  3 mL Intravenous Q12H   D[facility]   3h  80.5kg   Hep 2000   LUA AVf - hectorol 1 ug - epogen 32,000 tiw  Assessment/Plan: 1. R. Displaced patella fx: S/P ORIF 03/11/17 per Dr. Lyla Glassing. Per primary/Ortho.  2. ESRD -T,Th, S. Next HD 03/16/17. K+ 4.3. Use 2.0 K bath. No heparin.  3. Anemia - HGB 7.8 today. Rec'd 60 mcg Aranesp 03/14/17. Follow HGB. Transfuse if needed.  4. Secondary hyperparathyroidism - cont VDRA/Binders.  Ca 8.6 C Ca 9.6 Phos 7.0.  5. HTN/volume - HD 03/14/17 Pre wt 86.1 Net UF 2.0 liters Post wt 84.1 kg. Still above OP EDW. Possible variance in bed wt. No evidence of volume overload by exam. Attempt UFG 2-2.5 liters tomorrow.  6. Nutrition - Albumin 2.8 renal diet with fld restrictions. Add renal vit/prostat.  7. H/O CAD: per primary  Leandra Vanderweele H. Merril Isakson NP-C 03/15/2017, 9:39 AM  [employer] [phone]

## 2017-03-15 NOTE — Progress Notes (Signed)
Physical Therapy Treatment Patient Details Name: Sherry Cox MRN: [id number] DOB: 10/27/1960 Today's Date: 03/15/2017    History of Present Illness Sherry Cox a 56 y.o.female,CAD s/p CABG, carotid stenosis, w ESRD on HD (T, T, S), no hx of osteoporosis, apparently c/o Fall onto a divider for a screen door on the ground where she hurt her knee cap. Pt found to have a patella fx. Pt s/p ORIF of R patella 9/3.    PT Comments    Pt pleasant and able to increase gait and function. Pt able to perform self care in standing after toileting without assist. Pt educated for stair ambulation, progression of activity and HEP. Will continue to follow with pt safe to return home with assist of spouse.  SpO2 95% on RA    Follow Up Recommendations  Home health PT;Supervision/Assistance - 24 hour     Equipment Recommendations  Rolling walker with 5" wheels;3in1 (PT) (youth RW, pt is 4'10")    Recommendations for Other Services       Precautions / Restrictions Precautions Precautions: Fall Required Braces or Orthoses: Knee Immobilizer - Right Knee Immobilizer - Right: On at all times Restrictions Weight Bearing Restrictions: Yes RLE Weight Bearing: Weight bearing as tolerated    Mobility  Bed Mobility Overal bed mobility: Needs Assistance Bed Mobility: Supine to Sit     Supine to sit: Min assist     General bed mobility comments: assist to move RLE and fully elevate trunk. Pt educated for use of LLE to assist RLE as well as use of belt to move RLE  Transfers Overall transfer level: Needs assistance   Transfers: Sit to/from Stand Sit to Stand: Min guard         General transfer comment: cues for backing fully to surface and hand placement x 3 trials  Ambulation/Gait Ambulation/Gait assistance: Min guard Ambulation Distance (Feet): 60 Feet Assistive device: Rolling walker (2 wheeled) Gait Pattern/deviations: Step-to pattern   Gait velocity interpretation:  Below normal speed for age/gender General Gait Details: slow gait with good balance, limited by fatigue of LUE    Stairs Stairs: Yes   Stair Management: Backwards;With walker Number of Stairs: 2 General stair comments: cues for sequence, safety and spouse present to assist with stairs with handout provided  Wheelchair Mobility    Modified Rankin (Stroke Patients Only)       Balance Overall balance assessment: Needs assistance   Sitting balance-Leahy Scale: Good       Standing balance-Leahy Scale: Fair                              Cognition Arousal/Alertness: Awake/alert Behavior During Therapy: WFL for tasks assessed/performed Overall Cognitive Status: Within Functional Limits for tasks assessed                                        Exercises      General Comments        Pertinent Vitals/Pain Pain Assessment: No/denies pain    Home Living                      Prior Function            PT Goals (current goals can now be found in the care plan section) Progress towards PT goals: Progressing toward goals    Frequency  Min 3X/week      PT Plan Discharge plan needs to be updated    Co-evaluation              AM-PAC PT "6 Clicks" Daily Activity  Outcome Measure  Difficulty turning over in bed (including adjusting bedclothes, sheets and blankets)?: Unable Difficulty moving from lying on back to sitting on the side of the bed? : A Lot Difficulty sitting down on and standing up from a chair with arms (e.g., wheelchair, bedside commode, etc,.)?: A Little Help needed moving to and from a bed to chair (including a wheelchair)?: A Little Help needed walking in hospital room?: A Little Help needed climbing 3-5 steps with a railing? : A Little 6 Click Score: 15    End of Session Equipment Utilized During Treatment: Gait belt;Right knee immobilizer Activity Tolerance: Patient tolerated treatment well Patient  left: in chair;with call bell/phone within reach;with family/visitor present   PT Visit Diagnosis: Unsteadiness on feet (R26.81);History of falling (Z91.81);Difficulty in walking, not elsewhere classified (R26.2)     Time: 8472-0721 PT Time Calculation (min) (ACUTE ONLY): 41 min  Charges:  $Gait Training: 23-37 mins $Therapeutic Activity: 8-22 mins                    G Codes:       Elwyn Reach, PT [phone]   [place] 03/15/2017, 12:43 PM

## 2017-03-15 NOTE — Progress Notes (Signed)
   Subjective:  Patient reports pain as mild to moderate.  No c/o  Objective:   VITALS:   Vitals:   03/14/17 2147 03/15/17 0518 03/15/17 0925 03/15/17 1239  BP: (!) 147/39 (!) 157/38 (!) 159/44   Pulse: (!) 58 (!) 191 62   Resp: 18 18    Temp: 99.1 F (37.3 C) 98.4 F (36.9 C)    TempSrc: Oral Oral    SpO2: 99% (!) 79%  95%  Weight:  83.8 kg (184 lb 11.9 oz)    Height:        NAD ABD soft Sensation intact distally Intact pulses distally Dorsiflexion/Plantar flexion intact Incision: dressing C/D/I Compartment soft VAC intact Knee immobilizer in place   Lab Results  Component Value Date   WBC 7.9 03/15/2017   HGB 7.8 (L) 03/15/2017   HCT 25.0 (L) 03/15/2017   MCV 97.3 03/15/2017   PLT 184 03/15/2017   BMET    Component Value Date/Time   NA 132 (L) 03/15/2017 0557   K 4.3 03/15/2017 0557   CL 94 (L) 03/15/2017 0557   CO2 29 03/15/2017 0557   GLUCOSE 87 03/15/2017 0557   BUN 12 03/15/2017 0557   CREATININE 4.75 (H) 03/15/2017 0557   CALCIUM 8.6 (L) 03/15/2017 0557   GFRNONAA 9 (L) 03/15/2017 0557   GFRAA 11 (L) 03/15/2017 0557     Assessment/Plan: 4 Days Post-Op   Principal Problem:   Patellar fracture Active Problems:   ESRD on dialysis (HCC)   CAD (coronary artery disease)   Hyponatremia   Displaced transverse fracture of right patella, initial encounter for closed fracture   WBAT RLE with walker Knee immobilizer at all times Maintain strict extension of right knee DVT ppx: ASA, SCDs, TEDs PT/OT Pain control: changed frequency of norco, increased PRN dilaudid dose, added robaxin for spasm Dispo: D/C planning, will need to switch house VAC unit to prevena portable unit upon d/c (@ bedside), f/u with me next Fri 9/14 - call to schedule   Sherry Cox, Sherry Cox 03/15/2017, 2:10 PM   Rod Can, MD Cell [phone]

## 2017-03-15 NOTE — Plan of Care (Signed)
Problem: Pain Managment: Goal: General experience of comfort will improve Outcome: Progressing Pt will be at goal tolerance level prior to discharge.  Problem: Skin Integrity: Goal: Risk for impaired skin integrity will decrease Outcome: Progressing Pt will be turned or repositioned q 2 hrs to prevent skin breakdown  Problem: Activity: Goal: Risk for activity intolerance will decrease Outcome: Progressing PT's activity tolerance will increase prior to discharge.

## 2017-03-15 NOTE — Care Management Important Message (Signed)
Important Message  Patient Details  Name: Sherry Cox MRN: [id number] Date of Birth: 08/22/1960   Medicare Important Message Given:  Yes    [name] Abena 03/15/2017, 9:46 AM

## 2017-03-15 NOTE — Progress Notes (Signed)
Occupational Therapy Evaluation Patient Details Name: Sherry Cox MRN: [id number] DOB: 1960/10/23 Today's Date: 03/15/2017    History of Present Illness Sherry Cox a 56 y.o.female,CAD s/p CABG, carotid stenosis, w ESRD on HD (T, T, S), no hx of osteoporosis, apparently c/o Fall onto a divider for a screen door on the ground where she hurt her knee cap. Pt found to have a patella fx. Pt s/p ORIF of R patella 9/3.   Clinical Impression   PTA Pt independent in ADL/IADL and mobility. Pt is currently max A for LB ADL and set up for others. Min guard assist for mobility with RW this session in the room. Pt fatigues quickly. Please see ADL section below for more indepth details. OT went over in depth compensatory strategies for dressing/bathing LB and especially tub transfer with 3 in 1 and RW. Reviewed donning KI with husband as well. Pt and husband verbalized understanding. OT education complete. Pt will have 24 hour assist from her husband at home, and has other good family support.   I STRONGLY feel like the Pt having a wheelchair for transport to and from HD needs to be considered for transfers and also for fatigue as a safety factor to prevent falls in addition to 3 in 1.     Follow Up Recommendations  No OT follow up;Supervision/Assistance - 24 hour    Equipment Recommendations  3 in 1 bedside commode;Wheelchair (measurements OT);Wheelchair cushion (measurements OT)    Recommendations for Other Services       Precautions / Restrictions Precautions Precautions: Fall Precaution Comments: R KI on at all times, no knee flexion Required Braces or Orthoses: Knee Immobilizer - Right Knee Immobilizer - Right: On at all times Restrictions Weight Bearing Restrictions: Yes RLE Weight Bearing: Weight bearing as tolerated      Mobility Bed Mobility Overal bed mobility: Needs Assistance Bed Mobility: Supine to Sit     Supine to sit: Min assist     General bed mobility  comments: Pt sitting OOB in recliner  Transfers Overall transfer level: Needs assistance   Transfers: Sit to/from Stand Sit to Stand: Min guard         General transfer comment: vc for safe hand placement    Balance Overall balance assessment: Needs assistance Sitting-balance support: No upper extremity supported;Feet supported Sitting balance-Sherry Cox: Good     Standing balance support: During functional activity;Bilateral upper extremity supported Standing balance-Sherry Cox: Fair Standing balance comment: Use of RW for balance                            ADL either performed or assessed with clinical judgement   ADL Overall ADL's : Needs assistance/impaired Eating/Feeding: Modified independent;Sitting   Grooming: Set up;Sitting Grooming Details (indicate cue type and reason): in recliner Upper Body Bathing: Moderate assistance;Sitting   Lower Body Bathing: Maximal assistance;With caregiver independent assisting;Sitting/lateral leans   Upper Body Dressing : Set up;Sitting   Lower Body Dressing: Maximal assistance;With caregiver independent assisting;Sitting/lateral leans   Toilet Transfer: Min guard;Minimal assistance;Ambulation;BSC   Toileting- Clothing Manipulation and Hygiene: Maximal assistance;With caregiver independent assisting;Sit to/from stand   Tub/ Shower Transfer: Tub transfer;Moderate assistance;With caregiver independent assisting;Anterior/posterior;3 in Mudlogger Details (indicate cue type and reason): demonstrated with RW and 3 in 1 in room. Pt and husband verbalized understanding Functional mobility during ADLs: Min guard;Minimal assistance;Rolling walker       Vision Patient Visual Report: No change from baseline  Perception     Praxis      Pertinent Vitals/Pain Pain Assessment: 0-10 Pain Score: 4  Pain Location: R knee Pain Descriptors / Indicators: Discomfort Pain Intervention(s):  Monitored during session;Repositioned;Premedicated before session     Hand Dominance Right   Extremity/Trunk Assessment Upper Extremity Assessment Upper Extremity Assessment: Overall WFL for tasks assessed   Lower Extremity Assessment Lower Extremity Assessment: Defer to PT evaluation   Cervical / Trunk Assessment Cervical / Trunk Assessment: Normal   Communication Communication Communication: No difficulties   Cognition Arousal/Alertness: Awake/alert Behavior During Therapy: WFL for tasks assessed/performed Overall Cognitive Status: Within Functional Limits for tasks assessed                                     General Comments  Pt's husband present during session    Exercises     Shoulder Instructions      Home Living Family/patient expects to be discharged to:: Private residence Living Arrangements: Spouse/significant other Available Help at Discharge: Family;Available 24 hours/day Type of Home: House Home Access: Stairs to enter [employer] of Steps: 2 Entrance Stairs-Rails: None Home Layout: One level     Bathroom Shower/Tub: Teacher, early years/pre: Standard     Home Equipment: Cane - single point          Prior Functioning/Environment Level of Independence: Independent        Comments: doesn't drive, goes to HD T, TH, S        OT Problem List: Decreased range of motion;Decreased activity tolerance;Impaired balance (sitting and/or standing);Decreased knowledge of use of DME or AE;Pain      OT Treatment/Interventions:      OT Goals(Current goals can be found in the care plan section) Acute Rehab OT Goals Patient Stated Goal: to get home OT Goal Formulation: With patient/family Time For Goal Achievement: 03/29/17 Potential to Achieve Goals: Good  OT Frequency:     Barriers to D/C:            Co-evaluation              AM-PAC PT "6 Clicks" Daily Activity     Outcome Measure Help from another  person eating meals?: None Help from another person taking care of personal grooming?: A Little Help from another person toileting, which includes using toliet, bedpan, or urinal?: A Lot Help from another person bathing (including washing, rinsing, drying)?: A Lot Help from another person to put on and taking off regular upper body clothing?: A Little Help from another person to put on and taking off regular lower body clothing?: Total 6 Click Score: 15   End of Session Equipment Utilized During Treatment: Gait belt;Rolling walker;Right knee immobilizer  Activity Tolerance: Patient tolerated treatment well Patient left: in chair;with call bell/phone within reach;with family/visitor present  OT Visit Diagnosis: Pain;Other abnormalities of gait and mobility (R26.89) Pain - Right/Left: Right Pain - part of body: Knee                Time: 1221-1259 OT Time Calculation (min): 38 min Charges:  OT General Charges $OT Visit: 1 Visit OT Evaluation $OT Eval Moderate Complexity: 1 Mod OT Treatments $Self Care/Home Management : 23-37 mins G-Codes:    Hulda Humphrey OTR/L [place] 03/15/2017, 3:33 PM

## 2017-03-15 NOTE — Discharge Summary (Signed)
Physician Discharge Summary  Sherry Cox PXT:[id number] DOB: Nov 01, 1960 DOA: 03/10/2017  PCP: Patient, No Pcp Per  Admit date: 03/10/2017 Discharge date: 03/15/2017  Time spent: 50 minutes  Recommendations for Outpatient Follow-up:  1. Follow-up with Dr.Swinteck, orthopedics 03/22/2017.   Discharge Diagnoses:  Principal Problem:   Patellar fracture Active Problems:   ESRD on dialysis [facility])   CAD (coronary artery disease)   Hyponatremia   Displaced transverse fracture of right patella, initial encounter for closed fracture   Discharge Condition: Stable and improved  Diet recommendation: Heart healthy  Filed Weights   03/14/17 0801 03/14/17 1106 03/15/17 0518  Weight: 86.1 kg (189 lb 13.1 oz) 84.1 kg (185 lb 6.5 oz) 83.8 kg (184 lb 11.9 oz)    History of present illness:  Per Dr Sherry Cox  is a 56 y.o. female, CAD s/p CABG, carotid stenosis,  w ESRD on HD (T, T, S), no hx of osteoporosis, apparently c/o Fall onto a divider of a screen door on the ground where she hurt her knee cap.  No syncope. No chest pain.  Pt presented to ED for evaluation of right knee pain.   In ED, Xray=> IMPRESSION: Horizontal fracture through the midbody of the patella with 3 cm of distraction Orthopedics was consulted by ED, and will be by to evaluate the patient, appreciate their input.   Pt stated that she had her routine dialysis 1 day prior to admission, and will not need dialysis til Tuesday.  She had a full session  Hospital Course:  #1 acute right displaced patella fracture Secondary to mechanical fall. Patient status post ORIF of patella 03/11/2017. Pain management per orthopedics. PT/OT. Mobilize. Likely need skilled nursing facility on discharge, however patient wanted to go home with home health. Patient was subsequently discharged home with home health.  #2 end-stage renal disease on hemodialysis Tuesday Thursday Saturday Patient was seen by nephrology and underwent  hemodialysis on her hemodialysis days.   #3 coronary artery disease Patient was maintained on aspirin and statin.  #4 hyponatremia Improved with hemodialysis.   #5 hypothyroidism Continued on home regimen of Synthroid.  #6 anemia of chronic disease secondary to end-stage renal disease Stable.   #7 anxiety Continued on home regimen of Zoloft and Klonopin.   Procedures:  CT right knee 03/10/2017  Plain films of the right knee 03/11/2017, 03/10/2017  ORIF right patella 03/11/2017--Dr.Dr Sherry Cox   Consultations:  Orthopedics: Dr. Lyla Cox 03/11/2017  Nephrology: Dr. Jonnie Cox 03/12/2017  Discharge Exam: Vitals:   03/15/17 1239 03/15/17 1435  BP:  (!) 140/38  Pulse:  64  Resp:    Temp:  98.5 F (36.9 C)  SpO2: 95% 94%    General: NAD Cardiovascular: RRR Respiratory: CTAB  Discharge Instructions   Discharge Instructions    Diet - low sodium heart healthy    Complete by:  As directed      Current Discharge Medication List    START taking these medications   Details  Amino Acids-Protein Hydrolys (FEEDING SUPPLEMENT, PRO-STAT SUGAR FREE 64,) LIQD Take 30 mLs by mouth 2 (two) times daily. Qty: 900 mL, Refills: 0    methocarbamol (ROBAXIN) 500 MG tablet Take 1 tablet (500 mg total) by mouth every 8 (eight) hours as needed for muscle spasms. Qty: 20 tablet, Refills: 0    senna-docusate (SENOKOT-S) 8.6-50 MG tablet Take 1 tablet by mouth 2 (two) times daily.      CONTINUE these medications which have CHANGED   Details  aspirin EC 81  MG EC tablet Take 1 tablet (81 mg total) by mouth 2 (two) times daily with a meal. Use 2 times daily x 4 weeks, then back to home regimen of once daily. Qty: 56 tablet, Refills: 0    HYDROcodone-acetaminophen (NORCO/VICODIN) 5-325 MG tablet Take 1-2 tablets by mouth every 4 (four) hours as needed for moderate pain. Qty: 20 tablet, Refills: 0      CONTINUE these medications which have NOT CHANGED   Details  albuterol  (PROAIR HFA) 108 (90 BASE) MCG/ACT inhaler Inhale 2 puffs into the lungs every 4 (four) hours as needed. For wheezing    amLODipine (NORVASC) 10 MG tablet Take 1 tablet (10 mg total) by mouth daily. Qty: 90 tablet, Refills: 3    Artificial Tear Ointment (DRY EYES OP) Place 1 drop into both eyes daily.    clonazePAM (KLONOPIN) 0.5 MG tablet Take 1 tablet (0.5 mg total) by mouth 4 (four) times daily. Qty: 120 tablet, Refills: 2    folic acid (FOLVITE) 1 MG tablet Take 1 mg by mouth daily.    folic acid-vitamin b complex-vitamin c-selenium-zinc (DIALYVITE) 3 MG TABS Take 1 tablet by mouth daily.      ibuprofen (ADVIL,MOTRIN) 200 MG tablet Take 400 mg by mouth every 6 (six) hours as needed for mild pain.    levothyroxine (SYNTHROID, LEVOTHROID) 175 MCG tablet Take 175 mcg by mouth Daily.     LIPITOR 80 MG tablet TAKE 1 BY MOUTH DAILY      * 80MG  TOTAL *             * STOP SIMVASTATIN * Qty: 30 tablet, Refills: 3    LOPRESSOR 50 MG tablet TAKE 1 BY MOUTH TWO TIMES  DAILY ON MONDAY, WEDNESDAY AND FRIDAY AND 1 ON ALL    OTHER DAYS Qty: 70 tablet, Refills: 5    nitroGLYCERIN (NITROSTAT) 0.4 MG SL tablet Place 1 tablet (0.4 mg total) under the tongue every 5 (five) minutes as needed for chest pain. Qty: 50 tablet, Refills: 6    OXYGEN Inhale into the lungs. On 2    sertraline (ZOLOFT) 100 MG tablet Take 1 tablet (100 mg total) by mouth daily. Qty: 90 tablet, Refills: 3   Associated Diagnoses: Major depressive disorder, recurrent episode, moderate (HCC)    sevelamer (RENVELA) 800 MG tablet Take 1,600-4,000 mg by mouth as directed. Take 5 tablets (4000mg ) by mouth with meals and 1600 tablets by mouth with snacks.    sodium bicarbonate 650 MG tablet Take 650 mg by mouth 2 (two) times daily.     sodium chloride (OCEAN) 0.65 % SOLN nasal spray Place 1 spray into both nostrils as needed for congestion.    zolpidem (AMBIEN) 5 MG tablet Take 1 tablet (5 mg total) by mouth at bedtime as needed.  For sleep Qty: 30 tablet, Refills: 2       Allergies  Allergen Reactions  . Augmentin [Amoxicillin-Pot Clavulanate] Nausea And Vomiting    Vomiting   Follow-up Information    Rod Can, MD. Schedule an appointment as soon as possible for a visit on 03/22/2017.   Specialty:  Orthopedic Surgery Why:  For wound re-check Contact information: [address]. [address] [place] [address] [phone]        [facility] Follow up.   Why:  home health services arranged , office will call and to set up home visits Contact information: [address] [place]  [address] [phone]  HD Follow up on 03/16/2017.   Why:  f/u at HD as scheduled tommorrow Saturday 03/15/2017           The results of significant diagnostics from this hospitalization (including imaging, microbiology, ancillary and laboratory) are listed below for reference.    Significant Diagnostic Studies: Dg Knee 1-2 Views Right  Result Date: 03/11/2017 CLINICAL DATA:  56 year old female undergoing ORIF right patella fracture. EXAM: RIGHT KNEE - 1-2 VIEW; DG C-ARM 61-120 MIN COMPARISON:  Right knee CT 03/10/2017 FLUOROSCOPY TIME:  0 minutes 30 seconds FINDINGS: Intraoperative fluoroscopic spot views of the right knee in the AP and lateral projection. Two cannulated screws traverse the horizontal mid patella fracture. Fracture alignment appears near anatomic. The hardware appears intact. IMPRESSION: Right patella ORIF with no adverse features. Electronically Signed   By: Genevie Ann M.D.   On: 03/11/2017 15:14   Ct Knee Right Wo Contrast  Result Date: 03/10/2017 CLINICAL DATA:  Patient slipped and fell on within floor home today. Right knee pain. EXAM: CT OF THE  KNEE WITHOUT CONTRAST TECHNIQUE: Multidetector CT imaging of the knee was performed according to the standard protocol. Multiplanar CT image reconstructions were also generated. COMPARISON:  None. FINDINGS:  Bones/Joint/Cartilage An acute, closed, horizontal fracture through the body of the patella with 3.4 cm of distraction is noted. Tiny avulsion off the medial patella may reflect a tiny avulsion involving the medial patellofemoral ligament. There is a shallow impaction fracture of the anterior lateral femoral condyle, series 68, image 81 and series 3, image 86. Ligaments Suboptimally assessed by CT. Muscles and Tendons No intramuscular hemorrhage or atrophy. Soft tissues Prepatellar soft tissue contusion and swelling is noted with interposed 3.7 x 5.7 x 3 cm hematoma between the patellar fragments. Small joint effusion. IMPRESSION: 1. Acute, closed, horizontal fracture through the body of the patella with 3.4 cm of distraction noted. Interposed hematoma with overlying prepatellar soft tissue swelling. 2. Tiny avulsion fracture right off the medial aspect of the patella may reflect a small avulsion involving the medial patellofemoral ligament. 3. Subtle shallow impaction fracture of the anterior aspect of the lateral femoral condyle. Electronically Signed   By: Ashley Royalty M.D.   On: 03/10/2017 19:28   Dg Knee Complete 4 Views Right  Result Date: 03/10/2017 CLINICAL DATA:  Fall onto knee EXAM: RIGHT KNEE - COMPLETE 4+ VIEW COMPARISON:  None. FINDINGS: There is a fracture horizontally through the body of the patella with distraction of the fracture fragments by 3 cm. Prepatellar soft tissue swelling. No fracture the tibia or femur. IMPRESSION: Horizontal fracture through the midbody of the patella with 3 cm of distraction Electronically Signed   By: Suzy Bouchard M.D.   On: 03/10/2017 17:33   Dg Knee Right Port  Result Date: 03/11/2017 CLINICAL DATA:  Post op patella surgery EXAM: PORTABLE RIGHT KNEE - 1-2 VIEW COMPARISON:  03/10/2017 FINDINGS: Interval 2 screw fixation of patellar fracture with near anatomic alignment. Soft tissue gas over the anterior knee, consistent with recent postoperative change.  IMPRESSION: Interval screw fixation of patellar fracture with expected postsurgical changes Electronically Signed   By: Donavan Foil M.D.   On: 03/11/2017 16:00   Dg C-arm 1-60 Min  Result Date: 03/11/2017 CLINICAL DATA:  56 year old female undergoing ORIF right patella fracture. EXAM: RIGHT KNEE - 1-2 VIEW; DG C-ARM 61-120 MIN COMPARISON:  Right knee CT 03/10/2017 FLUOROSCOPY TIME:  0 minutes 30 seconds FINDINGS: Intraoperative fluoroscopic spot views of the right knee in the AP and  lateral projection. Two cannulated screws traverse the horizontal mid patella fracture. Fracture alignment appears near anatomic. The hardware appears intact. IMPRESSION: Right patella ORIF with no adverse features. Electronically Signed   By: Genevie Ann M.D.   On: 03/11/2017 15:14    Microbiology: Recent Results (from the past 240 hour(s))  Surgical pcr screen     Status: None   Collection Time: 03/10/17 10:37 PM  Result Value Ref Range Status   MRSA, PCR NEGATIVE NEGATIVE Final   Staphylococcus aureus NEGATIVE NEGATIVE Final    Comment: (NOTE) The Xpert SA Assay (FDA approved for NASAL specimens in patients 52 years of age and older), is one component of a comprehensive surveillance program. It is not intended to diagnose infection nor to guide or monitor treatment.      Labs: Basic Metabolic Panel:  Recent Labs Lab 03/11/17 0338 03/12/17 0543 03/13/17 0602 03/14/17 0544 03/15/17 0557  NA 136 132* 132* 130* 132*  K 5.2* 5.5* 4.2 4.4 4.3  CL 98* 95* 95* 91* 94*  CO2 27 25 27 28 29   GLUCOSE 74 94 94 89 87  BUN 18 28* 13 22* 12  CREATININE 6.24* 7.53* 4.76* 6.19* 4.75*  CALCIUM 8.3* 8.5* 8.4* 8.6* 8.6*  PHOS  --   --   --  7.0*  --    Liver Function Tests:  Recent Labs Lab 03/10/17 1847 03/11/17 0338 03/14/17 0544  AST 18 29  --   ALT 16 15  --   ALKPHOS 69 63  --   BILITOT 0.6 1.0  --   PROT 6.1* 5.2*  --   ALBUMIN 3.3* 3.0* 2.8*   No results for input(s): LIPASE, AMYLASE in the last  168 hours. No results for input(s): AMMONIA in the last 168 hours. CBC:  Recent Labs Lab 03/11/17 0338 03/12/17 0543 03/13/17 0602 03/14/17 0544 03/15/17 0557  WBC 7.5 10.4 7.5 7.6 7.9  NEUTROABS 5.7  --   --   --  5.8  HGB 8.8* 9.0* 8.0* 7.8* 7.8*  HCT 28.2* 28.4* 25.7* 24.5* 25.0*  MCV 97.2 96.6 96.3 96.5 97.3  PLT 238 235 186 177 184   Cardiac Enzymes: No results for input(s): CKTOTAL, CKMB, CKMBINDEX, TROPONINI in the last 168 hours. BNP: BNP (last 3 results) No results for input(s): BNP in the last 8760 hours.  ProBNP (last 3 results) No results for input(s): PROBNP in the last 8760 hours.  CBG: No results for input(s): GLUCAP in the last 168 hours.     SignedIrine Seal MD.  [facility] 03/15/2017, 2:38 PM

## 2017-03-16 DIAGNOSIS — D509 Iron deficiency anemia, unspecified: Secondary | ICD-10-CM | POA: Diagnosis not present

## 2017-03-16 DIAGNOSIS — N186 End stage renal disease: Secondary | ICD-10-CM | POA: Diagnosis not present

## 2017-03-16 DIAGNOSIS — I6523 Occlusion and stenosis of bilateral carotid arteries: Secondary | ICD-10-CM | POA: Diagnosis not present

## 2017-03-16 DIAGNOSIS — F17210 Nicotine dependence, cigarettes, uncomplicated: Secondary | ICD-10-CM | POA: Diagnosis not present

## 2017-03-16 DIAGNOSIS — W01198D Fall on same level from slipping, tripping and stumbling with subsequent striking against other object, subsequent encounter: Secondary | ICD-10-CM | POA: Diagnosis not present

## 2017-03-16 DIAGNOSIS — I2510 Atherosclerotic heart disease of native coronary artery without angina pectoris: Secondary | ICD-10-CM | POA: Diagnosis not present

## 2017-03-16 DIAGNOSIS — E039 Hypothyroidism, unspecified: Secondary | ICD-10-CM | POA: Diagnosis not present

## 2017-03-16 DIAGNOSIS — G629 Polyneuropathy, unspecified: Secondary | ICD-10-CM | POA: Diagnosis not present

## 2017-03-16 DIAGNOSIS — Z7682 Awaiting organ transplant status: Secondary | ICD-10-CM | POA: Diagnosis not present

## 2017-03-16 DIAGNOSIS — Z951 Presence of aortocoronary bypass graft: Secondary | ICD-10-CM | POA: Diagnosis not present

## 2017-03-16 DIAGNOSIS — S82031D Displaced transverse fracture of right patella, subsequent encounter for closed fracture with routine healing: Secondary | ICD-10-CM | POA: Diagnosis not present

## 2017-03-16 DIAGNOSIS — D631 Anemia in chronic kidney disease: Secondary | ICD-10-CM | POA: Diagnosis not present

## 2017-03-16 DIAGNOSIS — F419 Anxiety disorder, unspecified: Secondary | ICD-10-CM | POA: Diagnosis not present

## 2017-03-16 DIAGNOSIS — N2581 Secondary hyperparathyroidism of renal origin: Secondary | ICD-10-CM | POA: Diagnosis not present

## 2017-03-16 DIAGNOSIS — Z992 Dependence on renal dialysis: Secondary | ICD-10-CM | POA: Diagnosis not present

## 2017-03-18 DIAGNOSIS — S82031D Displaced transverse fracture of right patella, subsequent encounter for closed fracture with routine healing: Secondary | ICD-10-CM | POA: Diagnosis not present

## 2017-03-18 DIAGNOSIS — G629 Polyneuropathy, unspecified: Secondary | ICD-10-CM | POA: Diagnosis not present

## 2017-03-18 DIAGNOSIS — I2510 Atherosclerotic heart disease of native coronary artery without angina pectoris: Secondary | ICD-10-CM | POA: Diagnosis not present

## 2017-03-18 DIAGNOSIS — N186 End stage renal disease: Secondary | ICD-10-CM | POA: Diagnosis not present

## 2017-03-18 DIAGNOSIS — I6523 Occlusion and stenosis of bilateral carotid arteries: Secondary | ICD-10-CM | POA: Diagnosis not present

## 2017-03-18 DIAGNOSIS — D631 Anemia in chronic kidney disease: Secondary | ICD-10-CM | POA: Diagnosis not present

## 2017-03-19 DIAGNOSIS — Z992 Dependence on renal dialysis: Secondary | ICD-10-CM | POA: Diagnosis not present

## 2017-03-19 DIAGNOSIS — N2581 Secondary hyperparathyroidism of renal origin: Secondary | ICD-10-CM | POA: Diagnosis not present

## 2017-03-19 DIAGNOSIS — D509 Iron deficiency anemia, unspecified: Secondary | ICD-10-CM | POA: Diagnosis not present

## 2017-03-19 DIAGNOSIS — N186 End stage renal disease: Secondary | ICD-10-CM | POA: Diagnosis not present

## 2017-03-19 DIAGNOSIS — D631 Anemia in chronic kidney disease: Secondary | ICD-10-CM | POA: Diagnosis not present

## 2017-03-20 DIAGNOSIS — N186 End stage renal disease: Secondary | ICD-10-CM | POA: Diagnosis not present

## 2017-03-20 DIAGNOSIS — I2510 Atherosclerotic heart disease of native coronary artery without angina pectoris: Secondary | ICD-10-CM | POA: Diagnosis not present

## 2017-03-20 DIAGNOSIS — G629 Polyneuropathy, unspecified: Secondary | ICD-10-CM | POA: Diagnosis not present

## 2017-03-20 DIAGNOSIS — S82031D Displaced transverse fracture of right patella, subsequent encounter for closed fracture with routine healing: Secondary | ICD-10-CM | POA: Diagnosis not present

## 2017-03-20 DIAGNOSIS — D631 Anemia in chronic kidney disease: Secondary | ICD-10-CM | POA: Diagnosis not present

## 2017-03-20 DIAGNOSIS — I6523 Occlusion and stenosis of bilateral carotid arteries: Secondary | ICD-10-CM | POA: Diagnosis not present

## 2017-03-21 DIAGNOSIS — D631 Anemia in chronic kidney disease: Secondary | ICD-10-CM | POA: Diagnosis not present

## 2017-03-21 DIAGNOSIS — D509 Iron deficiency anemia, unspecified: Secondary | ICD-10-CM | POA: Diagnosis not present

## 2017-03-21 DIAGNOSIS — G629 Polyneuropathy, unspecified: Secondary | ICD-10-CM | POA: Diagnosis not present

## 2017-03-21 DIAGNOSIS — N186 End stage renal disease: Secondary | ICD-10-CM | POA: Diagnosis not present

## 2017-03-21 DIAGNOSIS — S82031D Displaced transverse fracture of right patella, subsequent encounter for closed fracture with routine healing: Secondary | ICD-10-CM | POA: Diagnosis not present

## 2017-03-21 DIAGNOSIS — Z992 Dependence on renal dialysis: Secondary | ICD-10-CM | POA: Diagnosis not present

## 2017-03-21 DIAGNOSIS — I6523 Occlusion and stenosis of bilateral carotid arteries: Secondary | ICD-10-CM | POA: Diagnosis not present

## 2017-03-21 DIAGNOSIS — N2581 Secondary hyperparathyroidism of renal origin: Secondary | ICD-10-CM | POA: Diagnosis not present

## 2017-03-21 DIAGNOSIS — I2510 Atherosclerotic heart disease of native coronary artery without angina pectoris: Secondary | ICD-10-CM | POA: Diagnosis not present

## 2017-03-22 DIAGNOSIS — S82031D Displaced transverse fracture of right patella, subsequent encounter for closed fracture with routine healing: Secondary | ICD-10-CM | POA: Diagnosis not present

## 2017-03-23 DIAGNOSIS — N186 End stage renal disease: Secondary | ICD-10-CM | POA: Diagnosis not present

## 2017-03-23 DIAGNOSIS — D509 Iron deficiency anemia, unspecified: Secondary | ICD-10-CM | POA: Diagnosis not present

## 2017-03-23 DIAGNOSIS — Z992 Dependence on renal dialysis: Secondary | ICD-10-CM | POA: Diagnosis not present

## 2017-03-23 DIAGNOSIS — D631 Anemia in chronic kidney disease: Secondary | ICD-10-CM | POA: Diagnosis not present

## 2017-03-23 DIAGNOSIS — N2581 Secondary hyperparathyroidism of renal origin: Secondary | ICD-10-CM | POA: Diagnosis not present

## 2017-03-25 DIAGNOSIS — I6523 Occlusion and stenosis of bilateral carotid arteries: Secondary | ICD-10-CM | POA: Diagnosis not present

## 2017-03-25 DIAGNOSIS — I2510 Atherosclerotic heart disease of native coronary artery without angina pectoris: Secondary | ICD-10-CM | POA: Diagnosis not present

## 2017-03-25 DIAGNOSIS — D631 Anemia in chronic kidney disease: Secondary | ICD-10-CM | POA: Diagnosis not present

## 2017-03-25 DIAGNOSIS — N186 End stage renal disease: Secondary | ICD-10-CM | POA: Diagnosis not present

## 2017-03-25 DIAGNOSIS — G629 Polyneuropathy, unspecified: Secondary | ICD-10-CM | POA: Diagnosis not present

## 2017-03-25 DIAGNOSIS — S82031D Displaced transverse fracture of right patella, subsequent encounter for closed fracture with routine healing: Secondary | ICD-10-CM | POA: Diagnosis not present

## 2017-03-26 DIAGNOSIS — D631 Anemia in chronic kidney disease: Secondary | ICD-10-CM | POA: Diagnosis not present

## 2017-03-26 DIAGNOSIS — N2581 Secondary hyperparathyroidism of renal origin: Secondary | ICD-10-CM | POA: Diagnosis not present

## 2017-03-26 DIAGNOSIS — I6523 Occlusion and stenosis of bilateral carotid arteries: Secondary | ICD-10-CM | POA: Diagnosis not present

## 2017-03-26 DIAGNOSIS — N186 End stage renal disease: Secondary | ICD-10-CM | POA: Diagnosis not present

## 2017-03-26 DIAGNOSIS — S82031D Displaced transverse fracture of right patella, subsequent encounter for closed fracture with routine healing: Secondary | ICD-10-CM | POA: Diagnosis not present

## 2017-03-26 DIAGNOSIS — Z992 Dependence on renal dialysis: Secondary | ICD-10-CM | POA: Diagnosis not present

## 2017-03-26 DIAGNOSIS — G629 Polyneuropathy, unspecified: Secondary | ICD-10-CM | POA: Diagnosis not present

## 2017-03-26 DIAGNOSIS — I2510 Atherosclerotic heart disease of native coronary artery without angina pectoris: Secondary | ICD-10-CM | POA: Diagnosis not present

## 2017-03-26 DIAGNOSIS — D509 Iron deficiency anemia, unspecified: Secondary | ICD-10-CM | POA: Diagnosis not present

## 2017-03-27 DIAGNOSIS — D631 Anemia in chronic kidney disease: Secondary | ICD-10-CM | POA: Diagnosis not present

## 2017-03-27 DIAGNOSIS — N186 End stage renal disease: Secondary | ICD-10-CM | POA: Diagnosis not present

## 2017-03-27 DIAGNOSIS — I2510 Atherosclerotic heart disease of native coronary artery without angina pectoris: Secondary | ICD-10-CM | POA: Diagnosis not present

## 2017-03-27 DIAGNOSIS — S82031D Displaced transverse fracture of right patella, subsequent encounter for closed fracture with routine healing: Secondary | ICD-10-CM | POA: Diagnosis not present

## 2017-03-27 DIAGNOSIS — G629 Polyneuropathy, unspecified: Secondary | ICD-10-CM | POA: Diagnosis not present

## 2017-03-27 DIAGNOSIS — I6523 Occlusion and stenosis of bilateral carotid arteries: Secondary | ICD-10-CM | POA: Diagnosis not present

## 2017-03-28 DIAGNOSIS — D509 Iron deficiency anemia, unspecified: Secondary | ICD-10-CM | POA: Diagnosis not present

## 2017-03-28 DIAGNOSIS — N2581 Secondary hyperparathyroidism of renal origin: Secondary | ICD-10-CM | POA: Diagnosis not present

## 2017-03-28 DIAGNOSIS — Z992 Dependence on renal dialysis: Secondary | ICD-10-CM | POA: Diagnosis not present

## 2017-03-28 DIAGNOSIS — D631 Anemia in chronic kidney disease: Secondary | ICD-10-CM | POA: Diagnosis not present

## 2017-03-28 DIAGNOSIS — N186 End stage renal disease: Secondary | ICD-10-CM | POA: Diagnosis not present

## 2017-03-29 DIAGNOSIS — Z4789 Encounter for other orthopedic aftercare: Secondary | ICD-10-CM | POA: Diagnosis not present

## 2017-03-29 DIAGNOSIS — S82031D Displaced transverse fracture of right patella, subsequent encounter for closed fracture with routine healing: Secondary | ICD-10-CM | POA: Diagnosis not present

## 2017-03-30 DIAGNOSIS — Z992 Dependence on renal dialysis: Secondary | ICD-10-CM | POA: Diagnosis not present

## 2017-03-30 DIAGNOSIS — N186 End stage renal disease: Secondary | ICD-10-CM | POA: Diagnosis not present

## 2017-03-30 DIAGNOSIS — D509 Iron deficiency anemia, unspecified: Secondary | ICD-10-CM | POA: Diagnosis not present

## 2017-03-30 DIAGNOSIS — D631 Anemia in chronic kidney disease: Secondary | ICD-10-CM | POA: Diagnosis not present

## 2017-03-30 DIAGNOSIS — N2581 Secondary hyperparathyroidism of renal origin: Secondary | ICD-10-CM | POA: Diagnosis not present

## 2017-04-01 DIAGNOSIS — G629 Polyneuropathy, unspecified: Secondary | ICD-10-CM | POA: Diagnosis not present

## 2017-04-01 DIAGNOSIS — I6523 Occlusion and stenosis of bilateral carotid arteries: Secondary | ICD-10-CM | POA: Diagnosis not present

## 2017-04-01 DIAGNOSIS — I2510 Atherosclerotic heart disease of native coronary artery without angina pectoris: Secondary | ICD-10-CM | POA: Diagnosis not present

## 2017-04-01 DIAGNOSIS — N186 End stage renal disease: Secondary | ICD-10-CM | POA: Diagnosis not present

## 2017-04-01 DIAGNOSIS — D631 Anemia in chronic kidney disease: Secondary | ICD-10-CM | POA: Diagnosis not present

## 2017-04-01 DIAGNOSIS — S82031D Displaced transverse fracture of right patella, subsequent encounter for closed fracture with routine healing: Secondary | ICD-10-CM | POA: Diagnosis not present

## 2017-04-02 DIAGNOSIS — D631 Anemia in chronic kidney disease: Secondary | ICD-10-CM | POA: Diagnosis not present

## 2017-04-02 DIAGNOSIS — N186 End stage renal disease: Secondary | ICD-10-CM | POA: Diagnosis not present

## 2017-04-02 DIAGNOSIS — S82031D Displaced transverse fracture of right patella, subsequent encounter for closed fracture with routine healing: Secondary | ICD-10-CM | POA: Diagnosis not present

## 2017-04-02 DIAGNOSIS — D509 Iron deficiency anemia, unspecified: Secondary | ICD-10-CM | POA: Diagnosis not present

## 2017-04-02 DIAGNOSIS — G629 Polyneuropathy, unspecified: Secondary | ICD-10-CM | POA: Diagnosis not present

## 2017-04-02 DIAGNOSIS — I2510 Atherosclerotic heart disease of native coronary artery without angina pectoris: Secondary | ICD-10-CM | POA: Diagnosis not present

## 2017-04-02 DIAGNOSIS — Z992 Dependence on renal dialysis: Secondary | ICD-10-CM | POA: Diagnosis not present

## 2017-04-02 DIAGNOSIS — N2581 Secondary hyperparathyroidism of renal origin: Secondary | ICD-10-CM | POA: Diagnosis not present

## 2017-04-02 DIAGNOSIS — I6523 Occlusion and stenosis of bilateral carotid arteries: Secondary | ICD-10-CM | POA: Diagnosis not present

## 2017-04-03 DIAGNOSIS — S82031D Displaced transverse fracture of right patella, subsequent encounter for closed fracture with routine healing: Secondary | ICD-10-CM | POA: Diagnosis not present

## 2017-04-03 DIAGNOSIS — G629 Polyneuropathy, unspecified: Secondary | ICD-10-CM | POA: Diagnosis not present

## 2017-04-03 DIAGNOSIS — D631 Anemia in chronic kidney disease: Secondary | ICD-10-CM | POA: Diagnosis not present

## 2017-04-03 DIAGNOSIS — N186 End stage renal disease: Secondary | ICD-10-CM | POA: Diagnosis not present

## 2017-04-03 DIAGNOSIS — I2510 Atherosclerotic heart disease of native coronary artery without angina pectoris: Secondary | ICD-10-CM | POA: Diagnosis not present

## 2017-04-03 DIAGNOSIS — I6523 Occlusion and stenosis of bilateral carotid arteries: Secondary | ICD-10-CM | POA: Diagnosis not present

## 2017-04-04 DIAGNOSIS — D631 Anemia in chronic kidney disease: Secondary | ICD-10-CM | POA: Diagnosis not present

## 2017-04-04 DIAGNOSIS — N186 End stage renal disease: Secondary | ICD-10-CM | POA: Diagnosis not present

## 2017-04-04 DIAGNOSIS — Z992 Dependence on renal dialysis: Secondary | ICD-10-CM | POA: Diagnosis not present

## 2017-04-04 DIAGNOSIS — D509 Iron deficiency anemia, unspecified: Secondary | ICD-10-CM | POA: Diagnosis not present

## 2017-04-04 DIAGNOSIS — N2581 Secondary hyperparathyroidism of renal origin: Secondary | ICD-10-CM | POA: Diagnosis not present

## 2017-04-05 DIAGNOSIS — I6523 Occlusion and stenosis of bilateral carotid arteries: Secondary | ICD-10-CM | POA: Diagnosis not present

## 2017-04-05 DIAGNOSIS — D631 Anemia in chronic kidney disease: Secondary | ICD-10-CM | POA: Diagnosis not present

## 2017-04-05 DIAGNOSIS — G629 Polyneuropathy, unspecified: Secondary | ICD-10-CM | POA: Diagnosis not present

## 2017-04-05 DIAGNOSIS — N186 End stage renal disease: Secondary | ICD-10-CM | POA: Diagnosis not present

## 2017-04-05 DIAGNOSIS — S82031D Displaced transverse fracture of right patella, subsequent encounter for closed fracture with routine healing: Secondary | ICD-10-CM | POA: Diagnosis not present

## 2017-04-05 DIAGNOSIS — M25561 Pain in right knee: Secondary | ICD-10-CM | POA: Diagnosis not present

## 2017-04-05 DIAGNOSIS — I2510 Atherosclerotic heart disease of native coronary artery without angina pectoris: Secondary | ICD-10-CM | POA: Diagnosis not present

## 2017-04-06 DIAGNOSIS — D631 Anemia in chronic kidney disease: Secondary | ICD-10-CM | POA: Diagnosis not present

## 2017-04-06 DIAGNOSIS — N186 End stage renal disease: Secondary | ICD-10-CM | POA: Diagnosis not present

## 2017-04-06 DIAGNOSIS — Z992 Dependence on renal dialysis: Secondary | ICD-10-CM | POA: Diagnosis not present

## 2017-04-06 DIAGNOSIS — D509 Iron deficiency anemia, unspecified: Secondary | ICD-10-CM | POA: Diagnosis not present

## 2017-04-06 DIAGNOSIS — N2581 Secondary hyperparathyroidism of renal origin: Secondary | ICD-10-CM | POA: Diagnosis not present

## 2017-04-07 DIAGNOSIS — N186 End stage renal disease: Secondary | ICD-10-CM | POA: Diagnosis not present

## 2017-04-07 DIAGNOSIS — Z992 Dependence on renal dialysis: Secondary | ICD-10-CM | POA: Diagnosis not present

## 2017-04-08 ENCOUNTER — Telehealth (HOSPITAL_COMMUNITY): Payer: Self-pay | Admitting: *Deleted

## 2017-04-08 ENCOUNTER — Ambulatory Visit (HOSPITAL_COMMUNITY): Payer: Medicare Other | Admitting: Psychiatry

## 2017-04-08 NOTE — Telephone Encounter (Signed)
phone call to patient regarding provider out of office.   patient said she will have to call us back to schedule an appointment due to she is in rehab.   she said she fell and broke her knee.

## 2017-04-09 DIAGNOSIS — D509 Iron deficiency anemia, unspecified: Secondary | ICD-10-CM | POA: Diagnosis not present

## 2017-04-09 DIAGNOSIS — N2581 Secondary hyperparathyroidism of renal origin: Secondary | ICD-10-CM | POA: Diagnosis not present

## 2017-04-09 DIAGNOSIS — Z23 Encounter for immunization: Secondary | ICD-10-CM | POA: Diagnosis not present

## 2017-04-09 DIAGNOSIS — D631 Anemia in chronic kidney disease: Secondary | ICD-10-CM | POA: Diagnosis not present

## 2017-04-09 DIAGNOSIS — N186 End stage renal disease: Secondary | ICD-10-CM | POA: Diagnosis not present

## 2017-04-09 DIAGNOSIS — Z992 Dependence on renal dialysis: Secondary | ICD-10-CM | POA: Diagnosis not present

## 2017-04-10 DIAGNOSIS — M25561 Pain in right knee: Secondary | ICD-10-CM | POA: Diagnosis not present

## 2017-04-10 DIAGNOSIS — S82031D Displaced transverse fracture of right patella, subsequent encounter for closed fracture with routine healing: Secondary | ICD-10-CM | POA: Diagnosis not present

## 2017-04-11 DIAGNOSIS — D631 Anemia in chronic kidney disease: Secondary | ICD-10-CM | POA: Diagnosis not present

## 2017-04-11 DIAGNOSIS — N2581 Secondary hyperparathyroidism of renal origin: Secondary | ICD-10-CM | POA: Diagnosis not present

## 2017-04-11 DIAGNOSIS — D509 Iron deficiency anemia, unspecified: Secondary | ICD-10-CM | POA: Diagnosis not present

## 2017-04-11 DIAGNOSIS — Z23 Encounter for immunization: Secondary | ICD-10-CM | POA: Diagnosis not present

## 2017-04-11 DIAGNOSIS — Z992 Dependence on renal dialysis: Secondary | ICD-10-CM | POA: Diagnosis not present

## 2017-04-11 DIAGNOSIS — N186 End stage renal disease: Secondary | ICD-10-CM | POA: Diagnosis not present

## 2017-04-12 ENCOUNTER — Other Ambulatory Visit: Payer: Self-pay | Admitting: Cardiology

## 2017-04-12 DIAGNOSIS — S82031D Displaced transverse fracture of right patella, subsequent encounter for closed fracture with routine healing: Secondary | ICD-10-CM | POA: Diagnosis not present

## 2017-04-12 DIAGNOSIS — M25561 Pain in right knee: Secondary | ICD-10-CM | POA: Diagnosis not present

## 2017-04-13 DIAGNOSIS — N2581 Secondary hyperparathyroidism of renal origin: Secondary | ICD-10-CM | POA: Diagnosis not present

## 2017-04-13 DIAGNOSIS — Z23 Encounter for immunization: Secondary | ICD-10-CM | POA: Diagnosis not present

## 2017-04-13 DIAGNOSIS — D509 Iron deficiency anemia, unspecified: Secondary | ICD-10-CM | POA: Diagnosis not present

## 2017-04-13 DIAGNOSIS — N186 End stage renal disease: Secondary | ICD-10-CM | POA: Diagnosis not present

## 2017-04-13 DIAGNOSIS — Z992 Dependence on renal dialysis: Secondary | ICD-10-CM | POA: Diagnosis not present

## 2017-04-13 DIAGNOSIS — D631 Anemia in chronic kidney disease: Secondary | ICD-10-CM | POA: Diagnosis not present

## 2017-04-16 DIAGNOSIS — D509 Iron deficiency anemia, unspecified: Secondary | ICD-10-CM | POA: Diagnosis not present

## 2017-04-16 DIAGNOSIS — Z992 Dependence on renal dialysis: Secondary | ICD-10-CM | POA: Diagnosis not present

## 2017-04-16 DIAGNOSIS — N186 End stage renal disease: Secondary | ICD-10-CM | POA: Diagnosis not present

## 2017-04-16 DIAGNOSIS — D631 Anemia in chronic kidney disease: Secondary | ICD-10-CM | POA: Diagnosis not present

## 2017-04-16 DIAGNOSIS — N2581 Secondary hyperparathyroidism of renal origin: Secondary | ICD-10-CM | POA: Diagnosis not present

## 2017-04-16 DIAGNOSIS — Z23 Encounter for immunization: Secondary | ICD-10-CM | POA: Diagnosis not present

## 2017-04-16 NOTE — Progress Notes (Signed)
Subjective: BJ:[id number] care, hypothyroidism HPI: Aniyah Nobis is a 56 y.o. female presenting to clinic today for:  1. Hypothyroidism History of hypothyroidism since 1996.  She notes that thyroid spontaneously stopped working thyroid supplementation after being ill with influenza.  She denies history of radioablation or thyroidectomy.  No known family history of thyroid disorder.  She does have a sister with a history of rheumatoid arthritis but otherwise no significant autoimmune disorders.  She notes that in March of this year, her thyroid level was noted to be low and her Synthroid was increased to 175 mcg daily.  She reports good tolerance of this medication.  Denies excessive sedation, weight fluctuations, constipation or diarrhea.  2.  Chronic pain/arthritis Patient reports that she has been on Norco up to 3-4 tablets daily for generalized spine arthritis and pinched nerve.  This was previously prescribed by her PCP.  She reports good control of pain with this.  Denies excessive sedation, decreased respiratory drive, constipation.  She notes that she has been out of this medication for about 2 weeks.  3. Oxygen requirement Patient reports that she was hospitalized within the last year and she has received a note from her was discharged with home oxygen.  She reports intermittent nighttime use of this.   she received a note from her home health care provider stating that she needed recertification for this therapy.  She denies shortness of breath, chest pain.  No known lung disease.  She is a former smoker.  She has been using 2 L via nasal cannula.  4. Anxiety Patient with a past medical history of generalized anxiety disorder.  She is followed by behavioral health for this.  She reports that is well controlled.  5.CAD Patient with a past medical history of CAD and carotid artery stenosis.  She is followed by Dr. Harl Bowie in [place].  She reports compliance with her beta-blocker and  cholesterol medication.  No chest pain, shortness of breath.   Past Medical History:  Diagnosis Date  . Ankle injury   . Anxiety   . Anxiety disorder   . CAD (coronary artery disease)    Total RCA, stress echo [facility] 11/2008-no ischemia, EF 55%; h/o CABG  . Carotid artery stenosis    12/10/2008 doppler 30-86% RICA, 5-78% LICA/see evaluation by Dr. Oneida Alar 2011  . Ejection fraction    EF 60-65%, echo, June, 2009  . ESRD on dialysis [facility])    Transplant consideration  . Hemochromatosis   . Hx of CABG    Off pump LIMA to LAD  05/2006  . Hx of tobacco use, presenting hazards to health 04/26/2012  . Hyperlipidemia   . Irregular heart beat   . Murmur    October, 2012  . Neuropathy   . Overweight(278.02)   . Tobacco abuse    Past Surgical History:  Procedure Laterality Date  . AV FISTULA PLACEMENT     Has HD on Tues,Thurs, and Saturday at [facility]  . CARDIAC VALVE SURGERY  2007  . CAROTID ANGIOGRAM Bilateral 03/06/2013   Procedure: CAROTID ANGIOGRAM;  Surgeon: Elam Dutch, MD;  Location: [facility] CATH LAB;  Service: Cardiovascular;  Laterality: Bilateral;  . CHOLECYSTECTOMY  1995   Gall Bladder  . CORONARY ARTERY BYPASS GRAFT  05/2006   Off pump LIMA to LAD  . INNER EAR SURGERY    . ORIF PATELLA Right 03/11/2017   Procedure: OPEN REDUCTION INTERNAL (ORIF) FIXATION PATELLA;  Surgeon: Rod Can, MD;  Location: [place];  Service: Orthopedics;  Laterality: Right;  . Plastic Surgery on leg     Social History   Social History  . Marital status: Married    Spouse name: N/A  . Number of children: N/A  . Years of education: N/A   Occupational History  . DISABLED    Social History Main Topics  . Smoking status: Current Every Day Smoker    Packs/day: 0.30    Years: 30.00    Types: Cigarettes    Start date: 02/02/1972  . Smokeless tobacco: Never Used  . Alcohol use No  . Drug use: No  . Sexual activity: Yes   Other Topics Concern  . Not on file   Social History Narrative    . No narrative on file   Current Meds  Medication Sig  . albuterol (PROAIR HFA) 108 (90 BASE) MCG/ACT inhaler Inhale 2 puffs into the lungs every 4 (four) hours as needed. For wheezing  . amLODipine (NORVASC) 10 MG tablet Take 1 tablet (10 mg total) by mouth daily. (Patient taking differently: Take 10 mg by mouth at bedtime. )  . Artificial Tear Ointment (DRY EYES OP) Place 1 drop into both eyes daily.  Marland Kitchen aspirin EC 81 MG tablet Take 81 mg by mouth 2 (two) times daily.  Marland Kitchen atorvastatin (LIPITOR) 80 MG tablet TAKE ONE TABLET BY MOUTH DAILY.  . clonazePAM (KLONOPIN) 0.5 MG tablet Take 1 tablet (0.5 mg total) by mouth 4 (four) times daily.  . folic acid (FOLVITE) 1 MG tablet Take 1 mg by mouth daily.  . folic acid-vitamin b complex-vitamin c-selenium-zinc (DIALYVITE) 3 MG TABS Take 1 tablet by mouth daily.    Marland Kitchen HYDROcodone-acetaminophen (NORCO/VICODIN) 5-325 MG tablet Take 1-2 tablets by mouth every 4 (four) hours as needed for moderate pain.  Marland Kitchen ibuprofen (ADVIL,MOTRIN) 200 MG tablet Take 400 mg by mouth every 6 (six) hours as needed for mild pain.  Marland Kitchen levothyroxine (SYNTHROID, LEVOTHROID) 175 MCG tablet Take 175 mcg by mouth Daily.   . metoprolol tartrate (LOPRESSOR) 50 MG tablet TAKE ONE TABLET BY MOUTH TWICE DAILY ON MONDAY, WEDNESDAY AND FRIDAY. TAKE ONE TABLET ON REMAINING DAYS.  Marland Kitchen nitroGLYCERIN (NITROSTAT) 0.4 MG SL tablet Place 1 tablet (0.4 mg total) under the tongue every 5 (five) minutes as needed for chest pain.  . OXYGEN Inhale into the lungs. On 2  . sertraline (ZOLOFT) 100 MG tablet Take 1 tablet (100 mg total) by mouth daily.  . sevelamer (RENVELA) 800 MG tablet Take 1,600-4,000 mg by mouth as directed. Take 5 tablets (4000mg ) by mouth with meals and 1600 tablets by mouth with snacks.  . sodium bicarbonate 650 MG tablet Take 650 mg by mouth 2 (two) times daily.   . [DISCONTINUED] Amino Acids-Protein Hydrolys (FEEDING SUPPLEMENT, PRO-STAT SUGAR FREE 64,) LIQD Take 30 mLs by mouth 2  (two) times daily.   Family History  Problem Relation Age of Onset  . Depression Sister   . Dementia Sister   . OCD Sister   . Diabetes Sister   . Hypertension Sister   . Heart attack Sister   . Bipolar disorder Brother   . Drug abuse Brother   . Diabetes Brother   . Hypertension Brother   . Heart disease Brother   . Hemochromatosis Brother   . Bipolar disorder Other   . ADD / ADHD Other   . Seizures Other   . Bipolar disorder Other   . Alcohol abuse Other   . Dementia Mother   . Heart  disease Mother   . Hypertension Mother   . Heart attack Mother   . Alcohol abuse Father   . Heart disease Father        Heart Disease before age 63  . Hypertension Father   . Heart attack Father   . Dementia Maternal Aunt   . OCD Sister   . Heart disease Sister        Heart Disease before age 14  . Diabetes Sister   . Hemochromatosis Sister   . Hypertension Sister   . Rheum arthritis Sister   . Heart attack Brother   . COPD Brother   . Heart attack Brother   . Paranoid behavior Neg Hx   . Schizophrenia Neg Hx   . Sexual abuse Neg Hx   . Physical abuse Neg Hx    Allergies  Allergen Reactions  . Augmentin [Amoxicillin-Pot Clavulanate] Nausea And Vomiting    Vomiting     Health Maintenance: Flu shot  ROS: Per HPI  Objective: Office vital signs reviewed. BP (!) 178/70   Pulse 68   Temp 97.6 F (36.4 C) (Oral)   Ht 4\' 10"  (1.473 m)   Wt 178 lb (80.7 kg)   SpO2 95%   BMI 37.20 kg/m   Physical Examination:  General: Awake, alert, obese, No acute distress HEENT: Normal    Neck: No masses palpated. No lymphadenopathy; thyroid not palpable    Eyes: No exophthalmos.    Throat: moist mucus membranes, Cardio: regular rate and rhythm, S1S2 heard, no murmurs appreciated Pulm: clear to auscultation bilaterally, no wheezes, rhonchi or rales; normal work of breathing on room air MSK: has brace on RLE Psych: mood stable, speech normal, pleasant  Assessment/ Plan: 56 y.o.  female   1. 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  Will obtain TSH.  For now continue Synthroid 175 mcg.  Release of information for records as below. - TSH  2. BMI 37.0-37.9, adult Weight loss encouraged.  She is limited by recent orthopedic interventions.  We will continue to counsel on lifestyle modification with each visit.  3. Encounter to establish care Release of information form completed today for records from Dr. Irene Shipper office.  Patient reports having had a recent mammogram and Pap smear.  She reports being up-to-date on colonoscopy as well.  Will work on vaccination records and other health maintenance needs. Did review with patient that we do not offer controlled substances on the first visit.  Will obtain records from [facility] prior to prescribing opioid medications.  Patient will follow-up in 1 week for this. ESRD on HD, managed by Dr. Hessie Dibble.  Release of information was completed to get the records from his office. Continue to follow with Dr. Harl Bowie for cardiovascular needs. Continue to follow with behavioral health for behavioral health needs.  Additionally, concern patient's oxygen requirement, she did not  meet O2 requirement today.  No desaturations were appreciated on room air at rest nor with ambulation.  I reemphasized that she likely does not require this.  However I am amenable to referring her to pulmonology for possible sleep study to see if there are any observed desaturations that would meet requirements for home O2.  We will plan to address again at next visit.   Janora Norlander, DO [place] [phone]

## 2017-04-17 DIAGNOSIS — M25561 Pain in right knee: Secondary | ICD-10-CM | POA: Diagnosis not present

## 2017-04-17 DIAGNOSIS — S82031D Displaced transverse fracture of right patella, subsequent encounter for closed fracture with routine healing: Secondary | ICD-10-CM | POA: Diagnosis not present

## 2017-04-18 DIAGNOSIS — N186 End stage renal disease: Secondary | ICD-10-CM | POA: Diagnosis not present

## 2017-04-18 DIAGNOSIS — D509 Iron deficiency anemia, unspecified: Secondary | ICD-10-CM | POA: Diagnosis not present

## 2017-04-18 DIAGNOSIS — Z23 Encounter for immunization: Secondary | ICD-10-CM | POA: Diagnosis not present

## 2017-04-18 DIAGNOSIS — Z992 Dependence on renal dialysis: Secondary | ICD-10-CM | POA: Diagnosis not present

## 2017-04-18 DIAGNOSIS — N2581 Secondary hyperparathyroidism of renal origin: Secondary | ICD-10-CM | POA: Diagnosis not present

## 2017-04-18 DIAGNOSIS — D631 Anemia in chronic kidney disease: Secondary | ICD-10-CM | POA: Diagnosis not present

## 2017-04-20 DIAGNOSIS — D631 Anemia in chronic kidney disease: Secondary | ICD-10-CM | POA: Diagnosis not present

## 2017-04-20 DIAGNOSIS — N2581 Secondary hyperparathyroidism of renal origin: Secondary | ICD-10-CM | POA: Diagnosis not present

## 2017-04-20 DIAGNOSIS — N186 End stage renal disease: Secondary | ICD-10-CM | POA: Diagnosis not present

## 2017-04-20 DIAGNOSIS — Z992 Dependence on renal dialysis: Secondary | ICD-10-CM | POA: Diagnosis not present

## 2017-04-20 DIAGNOSIS — Z23 Encounter for immunization: Secondary | ICD-10-CM | POA: Diagnosis not present

## 2017-04-20 DIAGNOSIS — D509 Iron deficiency anemia, unspecified: Secondary | ICD-10-CM | POA: Diagnosis not present

## 2017-04-23 DIAGNOSIS — D509 Iron deficiency anemia, unspecified: Secondary | ICD-10-CM | POA: Diagnosis not present

## 2017-04-23 DIAGNOSIS — Z992 Dependence on renal dialysis: Secondary | ICD-10-CM | POA: Diagnosis not present

## 2017-04-23 DIAGNOSIS — N186 End stage renal disease: Secondary | ICD-10-CM | POA: Diagnosis not present

## 2017-04-23 DIAGNOSIS — D631 Anemia in chronic kidney disease: Secondary | ICD-10-CM | POA: Diagnosis not present

## 2017-04-23 DIAGNOSIS — N2581 Secondary hyperparathyroidism of renal origin: Secondary | ICD-10-CM | POA: Diagnosis not present

## 2017-04-23 DIAGNOSIS — Z23 Encounter for immunization: Secondary | ICD-10-CM | POA: Diagnosis not present

## 2017-04-24 DIAGNOSIS — S82031D Displaced transverse fracture of right patella, subsequent encounter for closed fracture with routine healing: Secondary | ICD-10-CM | POA: Diagnosis not present

## 2017-04-24 DIAGNOSIS — M25561 Pain in right knee: Secondary | ICD-10-CM | POA: Diagnosis not present

## 2017-04-25 DIAGNOSIS — N2581 Secondary hyperparathyroidism of renal origin: Secondary | ICD-10-CM | POA: Diagnosis not present

## 2017-04-25 DIAGNOSIS — Z992 Dependence on renal dialysis: Secondary | ICD-10-CM | POA: Diagnosis not present

## 2017-04-25 DIAGNOSIS — N186 End stage renal disease: Secondary | ICD-10-CM | POA: Diagnosis not present

## 2017-04-25 DIAGNOSIS — D509 Iron deficiency anemia, unspecified: Secondary | ICD-10-CM | POA: Diagnosis not present

## 2017-04-25 DIAGNOSIS — D631 Anemia in chronic kidney disease: Secondary | ICD-10-CM | POA: Diagnosis not present

## 2017-04-25 DIAGNOSIS — Z23 Encounter for immunization: Secondary | ICD-10-CM | POA: Diagnosis not present

## 2017-04-26 DIAGNOSIS — S82031D Displaced transverse fracture of right patella, subsequent encounter for closed fracture with routine healing: Secondary | ICD-10-CM | POA: Diagnosis not present

## 2017-04-26 DIAGNOSIS — Z4789 Encounter for other orthopedic aftercare: Secondary | ICD-10-CM | POA: Diagnosis not present

## 2017-04-27 DIAGNOSIS — N186 End stage renal disease: Secondary | ICD-10-CM | POA: Diagnosis not present

## 2017-04-27 DIAGNOSIS — Z992 Dependence on renal dialysis: Secondary | ICD-10-CM | POA: Diagnosis not present

## 2017-04-27 DIAGNOSIS — D509 Iron deficiency anemia, unspecified: Secondary | ICD-10-CM | POA: Diagnosis not present

## 2017-04-27 DIAGNOSIS — N2581 Secondary hyperparathyroidism of renal origin: Secondary | ICD-10-CM | POA: Diagnosis not present

## 2017-04-27 DIAGNOSIS — D631 Anemia in chronic kidney disease: Secondary | ICD-10-CM | POA: Diagnosis not present

## 2017-04-27 DIAGNOSIS — Z23 Encounter for immunization: Secondary | ICD-10-CM | POA: Diagnosis not present

## 2017-04-29 ENCOUNTER — Ambulatory Visit (INDEPENDENT_AMBULATORY_CARE_PROVIDER_SITE_OTHER): Payer: Medicare Other | Admitting: Family Medicine

## 2017-04-29 ENCOUNTER — Encounter: Payer: Self-pay | Admitting: Family Medicine

## 2017-04-29 VITALS — BP 178/70 | HR 68 | Temp 97.6°F | Ht <= 58 in | Wt 178.0 lb

## 2017-04-29 DIAGNOSIS — Z6837 Body mass index (BMI) 37.0-37.9, adult: Secondary | ICD-10-CM | POA: Diagnosis not present

## 2017-04-29 DIAGNOSIS — E039 Hypothyroidism, unspecified: Secondary | ICD-10-CM

## 2017-04-29 DIAGNOSIS — Z7689 Persons encountering health services in other specified circumstances: Secondary | ICD-10-CM | POA: Diagnosis not present

## 2017-04-29 NOTE — Patient Instructions (Signed)
I will contact you will the results of your labs.  If anything is abnormal, I will call you.    We will get your records.  Follow up with me in 1 week for chronic pain management.

## 2017-04-30 DIAGNOSIS — Z23 Encounter for immunization: Secondary | ICD-10-CM | POA: Diagnosis not present

## 2017-04-30 DIAGNOSIS — D509 Iron deficiency anemia, unspecified: Secondary | ICD-10-CM | POA: Diagnosis not present

## 2017-04-30 DIAGNOSIS — N186 End stage renal disease: Secondary | ICD-10-CM | POA: Diagnosis not present

## 2017-04-30 DIAGNOSIS — Z992 Dependence on renal dialysis: Secondary | ICD-10-CM | POA: Diagnosis not present

## 2017-04-30 DIAGNOSIS — D631 Anemia in chronic kidney disease: Secondary | ICD-10-CM | POA: Diagnosis not present

## 2017-04-30 DIAGNOSIS — N2581 Secondary hyperparathyroidism of renal origin: Secondary | ICD-10-CM | POA: Diagnosis not present

## 2017-04-30 LAB — TSH: TSH: 3.08 u[IU]/mL (ref 0.450–4.500)

## 2017-05-01 DIAGNOSIS — M25561 Pain in right knee: Secondary | ICD-10-CM | POA: Diagnosis not present

## 2017-05-01 DIAGNOSIS — S82031D Displaced transverse fracture of right patella, subsequent encounter for closed fracture with routine healing: Secondary | ICD-10-CM | POA: Diagnosis not present

## 2017-05-02 DIAGNOSIS — D631 Anemia in chronic kidney disease: Secondary | ICD-10-CM | POA: Diagnosis not present

## 2017-05-02 DIAGNOSIS — N186 End stage renal disease: Secondary | ICD-10-CM | POA: Diagnosis not present

## 2017-05-02 DIAGNOSIS — N2581 Secondary hyperparathyroidism of renal origin: Secondary | ICD-10-CM | POA: Diagnosis not present

## 2017-05-02 DIAGNOSIS — Z23 Encounter for immunization: Secondary | ICD-10-CM | POA: Diagnosis not present

## 2017-05-02 DIAGNOSIS — D509 Iron deficiency anemia, unspecified: Secondary | ICD-10-CM | POA: Diagnosis not present

## 2017-05-02 DIAGNOSIS — Z992 Dependence on renal dialysis: Secondary | ICD-10-CM | POA: Diagnosis not present

## 2017-05-03 DIAGNOSIS — S82031D Displaced transverse fracture of right patella, subsequent encounter for closed fracture with routine healing: Secondary | ICD-10-CM | POA: Diagnosis not present

## 2017-05-03 DIAGNOSIS — M25561 Pain in right knee: Secondary | ICD-10-CM | POA: Diagnosis not present

## 2017-05-04 DIAGNOSIS — N2581 Secondary hyperparathyroidism of renal origin: Secondary | ICD-10-CM | POA: Diagnosis not present

## 2017-05-04 DIAGNOSIS — D631 Anemia in chronic kidney disease: Secondary | ICD-10-CM | POA: Diagnosis not present

## 2017-05-04 DIAGNOSIS — N186 End stage renal disease: Secondary | ICD-10-CM | POA: Diagnosis not present

## 2017-05-04 DIAGNOSIS — Z992 Dependence on renal dialysis: Secondary | ICD-10-CM | POA: Diagnosis not present

## 2017-05-04 DIAGNOSIS — Z23 Encounter for immunization: Secondary | ICD-10-CM | POA: Diagnosis not present

## 2017-05-04 DIAGNOSIS — D509 Iron deficiency anemia, unspecified: Secondary | ICD-10-CM | POA: Diagnosis not present

## 2017-05-06 ENCOUNTER — Ambulatory Visit (INDEPENDENT_AMBULATORY_CARE_PROVIDER_SITE_OTHER): Payer: Medicare Other | Admitting: Family Medicine

## 2017-05-06 ENCOUNTER — Encounter: Payer: Self-pay | Admitting: Family Medicine

## 2017-05-06 VITALS — BP 183/77 | HR 70 | Temp 97.0°F

## 2017-05-06 DIAGNOSIS — Z0289 Encounter for other administrative examinations: Secondary | ICD-10-CM | POA: Diagnosis not present

## 2017-05-06 DIAGNOSIS — M2550 Pain in unspecified joint: Secondary | ICD-10-CM | POA: Diagnosis not present

## 2017-05-06 DIAGNOSIS — G894 Chronic pain syndrome: Secondary | ICD-10-CM | POA: Diagnosis not present

## 2017-05-06 DIAGNOSIS — Z79891 Long term (current) use of opiate analgesic: Secondary | ICD-10-CM | POA: Diagnosis not present

## 2017-05-06 MED ORDER — ALBUTEROL SULFATE HFA 108 (90 BASE) MCG/ACT IN AERS: 2.0000 | INHALATION_SPRAY | RESPIRATORY_TRACT | 0 refills | Status: DC | PRN

## 2017-05-06 MED ORDER — HYDROCODONE-ACETAMINOPHEN 5-325 MG PO TABS: 1.0000 | ORAL_TABLET | Freq: Four times a day (QID) | ORAL | 0 refills | Status: DC | PRN

## 2017-05-06 MED ORDER — LEVOTHYROXINE SODIUM 175 MCG PO TABS: 175.0000 ug | ORAL_TABLET | Freq: Every day | ORAL | 1 refills | Status: DC

## 2017-05-06 MED ORDER — FOLIC ACID 1 MG PO TABS: 1.0000 mg | ORAL_TABLET | Freq: Every day | ORAL | 4 refills | Status: DC

## 2017-05-06 MED ORDER — NALOXONE HCL 4 MG/0.1ML NA LIQD: 1.0000 | Freq: Once | NASAL | 0 refills | Status: AC

## 2017-05-06 NOTE — Patient Instructions (Signed)
Narcotic Guidelines:  1. You cannot get an early refill, even it is lost.  2. You cannot get pain medications from any other doctor, unless it is the emergency department and related to a new problem or injury.  3. You cannot use alcohol, marijuana, cocaine or any other recreational drugs while using this medication. This is very dangerous.  4. You are willing to have your urine drug tested at each visit.  5. You will not drive while using this medication, because that can put yourself and others in serious danger of an accident. 6. If any medication is stolen, then there must be a police report to verify it, or it cannot be refilled.  7. I will not prescribe these medications for longer than 3 months.  8. You must bring your pill bottle to each visit.  9. You must use the same pharmacy for all refills for the medication, unless you clear it with me beforehand.  10. You cannot share or sell this medication.  11. Always take a stool softener to prevent constipation.  You have been given a 1 month supply.  Please make sure to schedule an appointment for refills before you leave today.  I have also prescribed Narcan for emergency use.  Please bring along with your pill bottle to your next visit.

## 2017-05-06 NOTE — Progress Notes (Signed)
Subjective: ES:[id number] pain management HPI: Sherry Cox is a 56 y.o. female presenting to clinic today for:  1. Chronic pain management Patient presenting to clinic for follow up on chronic pain management.  Patient reports that they have been well controlled on Norco 5/325 in the past.  This was previously prescribed by Dr Wenda Overland, patient's previous PCP.  They take 3 tablets total daily.  Patient reports that she was prescribed this for polyarthralgia several years ago.  She notes specifically chronic low back pain with radiculopathy.  She notes that she has had several x-rays of her joints which have shown arthritis.  She has never had an MRI of her back.  She does note left lower extremity numbness that has been present for years.  She reports that she was told by a doctor in the past that there were no interventions available to her for this.  Denies substance use.  She is never been tested for autoimmune arthritis in the past that she knows of.  She is ESRD on hemodialysis and cannot take NSAIDs.  Does not have Narcan at home. Pain is 8/10 today.  Denies somnolence, dizziness, difficulty breathing.      Social Hx: daily smoker.   Family History  Problem Relation Age of Onset  . Depression Sister   . Dementia Sister   . OCD Sister   . Diabetes Sister   . Hypertension Sister   . Heart attack Sister   . Bipolar disorder Brother   . Drug abuse Brother   . Diabetes Brother   . Hypertension Brother   . Heart disease Brother   . Hemochromatosis Brother   . Bipolar disorder Other   . ADD / ADHD Other   . Seizures Other   . Bipolar disorder Other   . Alcohol abuse Other   . Dementia Mother   . Heart disease Mother   . Hypertension Mother   . Heart attack Mother   . Alcohol abuse Father   . Heart disease Father        Heart Disease before age 64  . Hypertension Father   . Heart attack Father   . Dementia Maternal Aunt   . OCD Sister   . Heart disease Sister        Heart  Disease before age 33  . Diabetes Sister   . Hemochromatosis Sister   . Hypertension Sister   . Rheum arthritis Sister   . Heart attack Brother   . COPD Brother   . Heart attack Brother   . Paranoid behavior Neg Hx   . Schizophrenia Neg Hx   . Sexual abuse Neg Hx   . Physical abuse Neg Hx    Past Medical History:  Diagnosis Date  . Ankle injury   . Anxiety   . Anxiety disorder   . CAD (coronary artery disease)    Total RCA, stress echo [facility] 11/2008-no ischemia, EF 55%; h/o CABG  . Carotid artery stenosis    12/10/2008 doppler 62-26% RICA, 3-33% LICA/see evaluation by Dr. Oneida Alar 2011  . Ejection fraction    EF 60-65%, echo, June, 2009  . ESRD on dialysis [facility])    Transplant consideration  . Hemochromatosis   . Hx of CABG    Off pump LIMA to LAD  05/2006  . Hx of tobacco use, presenting hazards to health 04/26/2012  . Hyperlipidemia   . Irregular heart beat   . Murmur    October, 2012  . Neuropathy   .  Overweight(278.02)   . Tobacco abuse    Allergies  Allergen Reactions  . Augmentin [Amoxicillin-Pot Clavulanate] Nausea And Vomiting    Vomiting    Current Outpatient Prescriptions:  .  albuterol (PROAIR HFA) 108 (90 Base) MCG/ACT inhaler, Inhale 2 puffs into the lungs every 4 (four) hours as needed. For wheezing, Disp: 1 Inhaler, Rfl: 0 .  amLODipine (NORVASC) 10 MG tablet, Take 1 tablet (10 mg total) by mouth daily. (Patient taking differently: Take 10 mg by mouth at bedtime. ), Disp: 90 tablet, Rfl: 3 .  Artificial Tear Ointment (DRY EYES OP), Place 1 drop into both eyes daily., Disp: , Rfl:  .  aspirin EC 81 MG tablet, Take 81 mg by mouth 2 (two) times daily., Disp: , Rfl:  .  atorvastatin (LIPITOR) 80 MG tablet, TAKE ONE TABLET BY MOUTH DAILY., Disp: 30 tablet, Rfl: 3 .  clonazePAM (KLONOPIN) 0.5 MG tablet, Take 1 tablet (0.5 mg total) by mouth 4 (four) times daily., Disp: 120 tablet, Rfl: 2 .  folic acid (FOLVITE) 1 MG tablet, Take 1 tablet (1 mg total) by mouth  daily., Disp: 90 tablet, Rfl: 4 .  folic acid-vitamin b complex-vitamin c-selenium-zinc (DIALYVITE) 3 MG TABS, Take 1 tablet by mouth daily.  , Disp: , Rfl:  .  ibuprofen (ADVIL,MOTRIN) 200 MG tablet, Take 400 mg by mouth every 6 (six) hours as needed for mild pain., Disp: , Rfl:  .  levothyroxine (SYNTHROID, LEVOTHROID) 175 MCG tablet, Take 1 tablet (175 mcg total) by mouth daily before breakfast., Disp: 90 tablet, Rfl: 1 .  metoprolol tartrate (LOPRESSOR) 50 MG tablet, TAKE ONE TABLET BY MOUTH TWICE DAILY ON MONDAY, WEDNESDAY AND FRIDAY. TAKE ONE TABLET ON REMAINING DAYS., Disp: 70 tablet, Rfl: 3 .  nitroGLYCERIN (NITROSTAT) 0.4 MG SL tablet, Place 1 tablet (0.4 mg total) under the tongue every 5 (five) minutes as needed for chest pain., Disp: 50 tablet, Rfl: 6 .  OXYGEN, Inhale into the lungs. On 2, Disp: , Rfl:  .  sertraline (ZOLOFT) 100 MG tablet, Take 1 tablet (100 mg total) by mouth daily., Disp: 90 tablet, Rfl: 3 .  sevelamer (RENVELA) 800 MG tablet, Take 1,600-4,000 mg by mouth as directed. Take 5 tablets (4033m) by mouth with meals and 1600 tablets by mouth with snacks., Disp: , Rfl:  .  sodium bicarbonate 650 MG tablet, Take 650 mg by mouth 2 (two) times daily. , Disp: , Rfl:  .  sodium chloride (OCEAN) 0.65 % SOLN nasal spray, Place 1 spray into both nostrils as needed for congestion., Disp: , Rfl:  .  HYDROcodone-acetaminophen (NORCO) 5-325 MG tablet, Take 1 tablet by mouth every 6 (six) hours as needed for moderate pain., Disp: 90 tablet, Rfl: 0 .  naloxone (NARCAN) nasal spray 4 mg/0.1 mL, Place 1 spray into the nose once., Disp: 4 mg, Rfl: 0  Health Maintenance: Flu vaccine needed  ROS: Per HPI  Objective: Office vital signs reviewed. BP (!) 183/77   Pulse 70   Temp (!) 97 F (36.1 C) (Oral)   Physical Examination:  General: Awake, alert, overweight, No acute distress Cardio: regular rate and rhythm, S1S2 heard, blowing murmur throughout appreciated; LUE with AVF w/  palpable thrill present Pulm: clear to auscultation bilaterally, no wheezes, rhonchi or rales; normal work of breathing on room air GI: soft, non-tender, non-distended, bowel sounds present x4, no hepatomegaly, no splenomegaly, no masses Extremities: warm, dry, No edema, cyanosis or clubbing; +1 pulses bilaterally MSK: gait not assessed 2/2  RLE injury w/ brace.  No tenderness to palpation to the shoulders or knees.  There are no palpable bony abnormalities.  She had full active range of motion of the upper extremities. Neuro: light touch sensation in tact bilaterally.  Assessment/ Plan: 56 y.o. female   Chronic pain syndrome The [place] narcotic database was reviewed and demonstrated consistent Norco 5-3 25 use since 2016.  She is also on clonazepam 0.5 mg.  I did review with her the risks of concomitant use of the benzodiazepine and opioid medication, including death.  She understood this risk and wished to continue current medications.  Narcan was prescribed and sent to her pharmacy.  I did instruct her to bring this to next visit.  Reasons for use were reviewed with the patient and her husband.  She is on 15 morphine equivalents per day.  NarxCare Overdose risk score is 290.  Pain contract was signed today.  See EMR for a copy.  Urine drug screen was performed today.  Patient will follow-up in 4 weeks.  We will plan to get ESR, CRP, ANA and rheumatoid factor at that time.  Pain management contract signed - ToxASSURE Select 13 (MW), Urine - HYDROcodone-acetaminophen (NORCO) 5-325 MG tablet; Take 1 tablet by mouth every 6 (six) hours as needed for moderate pain.  Dispense: 90 tablet; Refill: 0 - naloxone (NARCAN) nasal spray 4 mg/0.1 mL; Place 1 spray into the nose once.  Dispense: 4 mg; Refill: 0  Polyarthralgia - C-reactive protein; Future - ANA w/Reflex if Positive; Future - Rheumatoid factor; Future - Sedimentation Rate; Future  Tavion Senkbeil Windell Moulding, [place] [phone]

## 2017-05-06 NOTE — Assessment & Plan Note (Addendum)
The [place] narcotic database was reviewed and demonstrated consistent Norco 5-3 25 use since 2016.  She is also on clonazepam 0.5 mg.  I did review with her the risks of concomitant use of the benzodiazepine and opioid medication, including death.  She understood this risk and wished to continue current medications.  Narcan was prescribed and sent to her pharmacy.  I did instruct her to bring this to next visit.  Reasons for use were reviewed with the patient and her husband.  She is on 15 morphine equivalents per day.  NarxCare Overdose risk score is 290.  Pain contract was signed today.  See EMR for a copy.  Urine drug screen was performed today.  Patient will follow-up in 4 weeks.  We will plan to get ESR, CRP, ANA and rheumatoid factor at that time.

## 2017-05-07 DIAGNOSIS — Z23 Encounter for immunization: Secondary | ICD-10-CM | POA: Diagnosis not present

## 2017-05-07 DIAGNOSIS — D509 Iron deficiency anemia, unspecified: Secondary | ICD-10-CM | POA: Diagnosis not present

## 2017-05-07 DIAGNOSIS — N186 End stage renal disease: Secondary | ICD-10-CM | POA: Diagnosis not present

## 2017-05-07 DIAGNOSIS — Z992 Dependence on renal dialysis: Secondary | ICD-10-CM | POA: Diagnosis not present

## 2017-05-07 DIAGNOSIS — D631 Anemia in chronic kidney disease: Secondary | ICD-10-CM | POA: Diagnosis not present

## 2017-05-07 DIAGNOSIS — N2581 Secondary hyperparathyroidism of renal origin: Secondary | ICD-10-CM | POA: Diagnosis not present

## 2017-05-08 ENCOUNTER — Ambulatory Visit (INDEPENDENT_AMBULATORY_CARE_PROVIDER_SITE_OTHER): Payer: Medicare Other | Admitting: Psychiatry

## 2017-05-08 ENCOUNTER — Encounter (HOSPITAL_COMMUNITY): Payer: Self-pay | Admitting: Psychiatry

## 2017-05-08 VITALS — BP 154/74 | HR 68 | Ht <= 58 in

## 2017-05-08 DIAGNOSIS — M2550 Pain in unspecified joint: Secondary | ICD-10-CM

## 2017-05-08 DIAGNOSIS — Z818 Family history of other mental and behavioral disorders: Secondary | ICD-10-CM | POA: Diagnosis not present

## 2017-05-08 DIAGNOSIS — Z810 Family history of intellectual disabilities: Secondary | ICD-10-CM | POA: Diagnosis not present

## 2017-05-08 DIAGNOSIS — R450 Nervousness: Secondary | ICD-10-CM | POA: Diagnosis not present

## 2017-05-08 DIAGNOSIS — Z811 Family history of alcohol abuse and dependence: Secondary | ICD-10-CM

## 2017-05-08 DIAGNOSIS — N19 Unspecified kidney failure: Secondary | ICD-10-CM

## 2017-05-08 DIAGNOSIS — Z736 Limitation of activities due to disability: Secondary | ICD-10-CM | POA: Diagnosis not present

## 2017-05-08 DIAGNOSIS — F17210 Nicotine dependence, cigarettes, uncomplicated: Secondary | ICD-10-CM | POA: Diagnosis not present

## 2017-05-08 DIAGNOSIS — I6523 Occlusion and stenosis of bilateral carotid arteries: Secondary | ICD-10-CM

## 2017-05-08 DIAGNOSIS — F331 Major depressive disorder, recurrent, moderate: Secondary | ICD-10-CM

## 2017-05-08 DIAGNOSIS — Z992 Dependence on renal dialysis: Secondary | ICD-10-CM

## 2017-05-08 DIAGNOSIS — F411 Generalized anxiety disorder: Secondary | ICD-10-CM

## 2017-05-08 MED ORDER — SERTRALINE HCL 100 MG PO TABS: 100.0000 mg | ORAL_TABLET | Freq: Every day | ORAL | 3 refills | Status: DC

## 2017-05-08 MED ORDER — CLONAZEPAM 0.5 MG PO TABS: 0.5000 mg | ORAL_TABLET | Freq: Four times a day (QID) | ORAL | 2 refills | Status: DC
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
## 2017-05-09 DIAGNOSIS — N2581 Secondary hyperparathyroidism of renal origin: Secondary | ICD-10-CM | POA: Diagnosis not present

## 2017-05-09 DIAGNOSIS — N186 End stage renal disease: Secondary | ICD-10-CM | POA: Diagnosis not present

## 2017-05-09 DIAGNOSIS — D631 Anemia in chronic kidney disease: Secondary | ICD-10-CM | POA: Diagnosis not present

## 2017-05-09 DIAGNOSIS — D509 Iron deficiency anemia, unspecified: Secondary | ICD-10-CM | POA: Diagnosis not present

## 2017-05-09 DIAGNOSIS — Z992 Dependence on renal dialysis: Secondary | ICD-10-CM | POA: Diagnosis not present

## 2017-05-10 DIAGNOSIS — M25561 Pain in right knee: Secondary | ICD-10-CM | POA: Diagnosis not present

## 2017-05-10 DIAGNOSIS — S82031D Displaced transverse fracture of right patella, subsequent encounter for closed fracture with routine healing: Secondary | ICD-10-CM | POA: Diagnosis not present

## 2017-05-10 LAB — TOXASSURE SELECT 13 (MW), URINE

## 2017-05-11 DIAGNOSIS — Z992 Dependence on renal dialysis: Secondary | ICD-10-CM | POA: Diagnosis not present

## 2017-05-11 DIAGNOSIS — N2581 Secondary hyperparathyroidism of renal origin: Secondary | ICD-10-CM | POA: Diagnosis not present

## 2017-05-11 DIAGNOSIS — N186 End stage renal disease: Secondary | ICD-10-CM | POA: Diagnosis not present

## 2017-05-11 DIAGNOSIS — D631 Anemia in chronic kidney disease: Secondary | ICD-10-CM | POA: Diagnosis not present

## 2017-05-11 DIAGNOSIS — D509 Iron deficiency anemia, unspecified: Secondary | ICD-10-CM | POA: Diagnosis not present

## 2017-05-13 DIAGNOSIS — S82031D Displaced transverse fracture of right patella, subsequent encounter for closed fracture with routine healing: Secondary | ICD-10-CM | POA: Diagnosis not present

## 2017-05-14 DIAGNOSIS — D631 Anemia in chronic kidney disease: Secondary | ICD-10-CM | POA: Diagnosis not present

## 2017-05-14 DIAGNOSIS — D509 Iron deficiency anemia, unspecified: Secondary | ICD-10-CM | POA: Diagnosis not present

## 2017-05-14 DIAGNOSIS — N2581 Secondary hyperparathyroidism of renal origin: Secondary | ICD-10-CM | POA: Diagnosis not present

## 2017-05-14 DIAGNOSIS — N186 End stage renal disease: Secondary | ICD-10-CM | POA: Diagnosis not present

## 2017-05-14 DIAGNOSIS — Z992 Dependence on renal dialysis: Secondary | ICD-10-CM | POA: Diagnosis not present

## 2017-05-15 DIAGNOSIS — S82031D Displaced transverse fracture of right patella, subsequent encounter for closed fracture with routine healing: Secondary | ICD-10-CM | POA: Diagnosis not present

## 2017-05-15 DIAGNOSIS — M25561 Pain in right knee: Secondary | ICD-10-CM | POA: Diagnosis not present

## 2017-05-16 DIAGNOSIS — N2581 Secondary hyperparathyroidism of renal origin: Secondary | ICD-10-CM | POA: Diagnosis not present

## 2017-05-16 DIAGNOSIS — Z992 Dependence on renal dialysis: Secondary | ICD-10-CM | POA: Diagnosis not present

## 2017-05-16 DIAGNOSIS — D509 Iron deficiency anemia, unspecified: Secondary | ICD-10-CM | POA: Diagnosis not present

## 2017-05-16 DIAGNOSIS — N186 End stage renal disease: Secondary | ICD-10-CM | POA: Diagnosis not present

## 2017-05-16 DIAGNOSIS — D631 Anemia in chronic kidney disease: Secondary | ICD-10-CM | POA: Diagnosis not present

## 2017-05-17 DIAGNOSIS — S82031D Displaced transverse fracture of right patella, subsequent encounter for closed fracture with routine healing: Secondary | ICD-10-CM | POA: Diagnosis not present

## 2017-05-17 DIAGNOSIS — M25561 Pain in right knee: Secondary | ICD-10-CM | POA: Diagnosis not present

## 2017-05-18 DIAGNOSIS — N186 End stage renal disease: Secondary | ICD-10-CM | POA: Diagnosis not present

## 2017-05-18 DIAGNOSIS — Z992 Dependence on renal dialysis: Secondary | ICD-10-CM | POA: Diagnosis not present

## 2017-05-18 DIAGNOSIS — N2581 Secondary hyperparathyroidism of renal origin: Secondary | ICD-10-CM | POA: Diagnosis not present

## 2017-05-18 DIAGNOSIS — D509 Iron deficiency anemia, unspecified: Secondary | ICD-10-CM | POA: Diagnosis not present

## 2017-05-18 DIAGNOSIS — D631 Anemia in chronic kidney disease: Secondary | ICD-10-CM | POA: Diagnosis not present

## 2017-05-20 ENCOUNTER — Encounter: Payer: Self-pay | Admitting: Family Medicine

## 2017-05-20 DIAGNOSIS — Z992 Dependence on renal dialysis: Secondary | ICD-10-CM

## 2017-05-20 DIAGNOSIS — N189 Chronic kidney disease, unspecified: Secondary | ICD-10-CM

## 2017-05-20 DIAGNOSIS — J449 Chronic obstructive pulmonary disease, unspecified: Secondary | ICD-10-CM | POA: Insufficient documentation

## 2017-05-20 DIAGNOSIS — Q211 Atrial septal defect: Secondary | ICD-10-CM | POA: Insufficient documentation

## 2017-05-20 DIAGNOSIS — N2581 Secondary hyperparathyroidism of renal origin: Secondary | ICD-10-CM

## 2017-05-20 DIAGNOSIS — D631 Anemia in chronic kidney disease: Secondary | ICD-10-CM | POA: Insufficient documentation

## 2017-05-20 DIAGNOSIS — N186 End stage renal disease: Secondary | ICD-10-CM | POA: Insufficient documentation

## 2017-05-20 DIAGNOSIS — Q2111 Secundum atrial septal defect: Secondary | ICD-10-CM | POA: Insufficient documentation

## 2017-05-21 DIAGNOSIS — D509 Iron deficiency anemia, unspecified: Secondary | ICD-10-CM | POA: Diagnosis not present

## 2017-05-21 DIAGNOSIS — N186 End stage renal disease: Secondary | ICD-10-CM | POA: Diagnosis not present

## 2017-05-21 DIAGNOSIS — D631 Anemia in chronic kidney disease: Secondary | ICD-10-CM | POA: Diagnosis not present

## 2017-05-21 DIAGNOSIS — N2581 Secondary hyperparathyroidism of renal origin: Secondary | ICD-10-CM | POA: Diagnosis not present

## 2017-05-21 DIAGNOSIS — Z992 Dependence on renal dialysis: Secondary | ICD-10-CM | POA: Diagnosis not present

## 2017-05-22 DIAGNOSIS — S82031D Displaced transverse fracture of right patella, subsequent encounter for closed fracture with routine healing: Secondary | ICD-10-CM | POA: Diagnosis not present

## 2017-05-22 DIAGNOSIS — M25561 Pain in right knee: Secondary | ICD-10-CM | POA: Diagnosis not present

## 2017-05-23 DIAGNOSIS — Z992 Dependence on renal dialysis: Secondary | ICD-10-CM | POA: Diagnosis not present

## 2017-05-23 DIAGNOSIS — N2581 Secondary hyperparathyroidism of renal origin: Secondary | ICD-10-CM | POA: Diagnosis not present

## 2017-05-23 DIAGNOSIS — D509 Iron deficiency anemia, unspecified: Secondary | ICD-10-CM | POA: Diagnosis not present

## 2017-05-23 DIAGNOSIS — D631 Anemia in chronic kidney disease: Secondary | ICD-10-CM | POA: Diagnosis not present

## 2017-05-23 DIAGNOSIS — N186 End stage renal disease: Secondary | ICD-10-CM | POA: Diagnosis not present

## 2017-05-24 DIAGNOSIS — M25561 Pain in right knee: Secondary | ICD-10-CM | POA: Diagnosis not present

## 2017-05-24 DIAGNOSIS — S82031D Displaced transverse fracture of right patella, subsequent encounter for closed fracture with routine healing: Secondary | ICD-10-CM | POA: Diagnosis not present

## 2017-05-25 DIAGNOSIS — N186 End stage renal disease: Secondary | ICD-10-CM | POA: Diagnosis not present

## 2017-05-25 DIAGNOSIS — Z992 Dependence on renal dialysis: Secondary | ICD-10-CM | POA: Diagnosis not present

## 2017-05-25 DIAGNOSIS — D509 Iron deficiency anemia, unspecified: Secondary | ICD-10-CM | POA: Diagnosis not present

## 2017-05-25 DIAGNOSIS — N2581 Secondary hyperparathyroidism of renal origin: Secondary | ICD-10-CM | POA: Diagnosis not present

## 2017-05-25 DIAGNOSIS — D631 Anemia in chronic kidney disease: Secondary | ICD-10-CM | POA: Diagnosis not present

## 2017-05-27 DIAGNOSIS — M25561 Pain in right knee: Secondary | ICD-10-CM | POA: Diagnosis not present

## 2017-05-27 DIAGNOSIS — S82031D Displaced transverse fracture of right patella, subsequent encounter for closed fracture with routine healing: Secondary | ICD-10-CM | POA: Diagnosis not present

## 2017-05-28 DIAGNOSIS — N2581 Secondary hyperparathyroidism of renal origin: Secondary | ICD-10-CM | POA: Diagnosis not present

## 2017-05-28 DIAGNOSIS — Z992 Dependence on renal dialysis: Secondary | ICD-10-CM | POA: Diagnosis not present

## 2017-05-28 DIAGNOSIS — D631 Anemia in chronic kidney disease: Secondary | ICD-10-CM | POA: Diagnosis not present

## 2017-05-28 DIAGNOSIS — D509 Iron deficiency anemia, unspecified: Secondary | ICD-10-CM | POA: Diagnosis not present

## 2017-05-28 DIAGNOSIS — N186 End stage renal disease: Secondary | ICD-10-CM | POA: Diagnosis not present

## 2017-05-29 DIAGNOSIS — D631 Anemia in chronic kidney disease: Secondary | ICD-10-CM | POA: Diagnosis not present

## 2017-05-29 DIAGNOSIS — D509 Iron deficiency anemia, unspecified: Secondary | ICD-10-CM | POA: Diagnosis not present

## 2017-05-29 DIAGNOSIS — N2581 Secondary hyperparathyroidism of renal origin: Secondary | ICD-10-CM | POA: Diagnosis not present

## 2017-05-29 DIAGNOSIS — Z992 Dependence on renal dialysis: Secondary | ICD-10-CM | POA: Diagnosis not present

## 2017-05-29 DIAGNOSIS — N186 End stage renal disease: Secondary | ICD-10-CM | POA: Diagnosis not present

## 2017-06-01 ENCOUNTER — Other Ambulatory Visit: Payer: Self-pay | Admitting: Cardiology

## 2017-06-01 DIAGNOSIS — D631 Anemia in chronic kidney disease: Secondary | ICD-10-CM | POA: Diagnosis not present

## 2017-06-01 DIAGNOSIS — N2581 Secondary hyperparathyroidism of renal origin: Secondary | ICD-10-CM | POA: Diagnosis not present

## 2017-06-01 DIAGNOSIS — Z992 Dependence on renal dialysis: Secondary | ICD-10-CM | POA: Diagnosis not present

## 2017-06-01 DIAGNOSIS — D509 Iron deficiency anemia, unspecified: Secondary | ICD-10-CM | POA: Diagnosis not present

## 2017-06-01 DIAGNOSIS — N186 End stage renal disease: Secondary | ICD-10-CM | POA: Diagnosis not present

## 2017-06-03 ENCOUNTER — Ambulatory Visit (INDEPENDENT_AMBULATORY_CARE_PROVIDER_SITE_OTHER): Payer: Medicare Other | Admitting: Family Medicine

## 2017-06-03 ENCOUNTER — Encounter: Payer: Self-pay | Admitting: Family Medicine

## 2017-06-03 VITALS — BP 179/78 | HR 76 | Temp 97.3°F | Ht <= 58 in | Wt 175.0 lb

## 2017-06-03 DIAGNOSIS — G894 Chronic pain syndrome: Secondary | ICD-10-CM

## 2017-06-03 DIAGNOSIS — I120 Hypertensive chronic kidney disease with stage 5 chronic kidney disease or end stage renal disease: Secondary | ICD-10-CM | POA: Diagnosis not present

## 2017-06-03 DIAGNOSIS — N186 End stage renal disease: Secondary | ICD-10-CM | POA: Diagnosis not present

## 2017-06-03 DIAGNOSIS — J069 Acute upper respiratory infection, unspecified: Secondary | ICD-10-CM

## 2017-06-03 DIAGNOSIS — M2550 Pain in unspecified joint: Secondary | ICD-10-CM

## 2017-06-03 MED ORDER — FLUTICASONE PROPIONATE 50 MCG/ACT NA SUSP: 2.0000 | Freq: Every day | NASAL | 6 refills | Status: DC

## 2017-06-03 MED ORDER — HYDROCODONE-ACETAMINOPHEN 5-325 MG PO TABS: 1.0000 | ORAL_TABLET | Freq: Four times a day (QID) | ORAL | 0 refills | Status: DC | PRN

## 2017-06-03 NOTE — Assessment & Plan Note (Signed)
Blood pressure not at goal.  Patient has not been taking her medications appropriately over the last couple of days.  I did advise that if blood pressure remains persistently elevated, we will need to add additional medication.  It does not sound that she is dropping too low during hemodialysis.  She will follow-up in 1 month for this.

## 2017-06-03 NOTE — Assessment & Plan Note (Signed)
U tox obtained today.  Norco refilled times 1 month.  The [place] Narcotic Database has been reviewed.  There were no red flags.  Norco refills were appropriate.  She does have Klonopin, which is prescribed by her psychiatrist Dr. Harrington Challenger.  Pain continues to be well-controlled with current regimen.  For this reason, we will continue with current regimen until the risks outweigh the benefits.  Autoimmune workup obtained today.  Will contact patient with results.  If negative, will plan to clinically evaluate patient for fibromyalgia.  Follow-up in 1 month.

## 2017-06-03 NOTE — Patient Instructions (Addendum)
I have ordered autoimmune workup labs to see if this is what is causing your multiple joint pains.  These results should be back in about a week and I will contact you with results.  Follow up in 1 month for chronic pain management.   Chronic Pain, Adult Chronic pain is a type of pain that lasts or keeps coming back (recurs) for at least six months. You may have chronic headaches, abdominal pain, or body pain. Chronic pain may be related to an illness, such as fibromyalgia or complex regional pain syndrome. Sometimes the cause of chronic pain is not known. Chronic pain can make it hard for you to do daily activities. If not treated, chronic pain can lead to other health problems, including anxiety and depression. Treatment depends on the cause and severity of your pain. You may need to work with a pain specialist to come up with a treatment plan. The plan may include medicine, counseling, and physical therapy. Many people benefit from a combination of two or more types of treatment to control their pain. Follow these instructions at home: Lifestyle  Consider keeping a pain diary to share with your health care providers.  Consider talking with a mental health care provider (psychologist) about how to cope with chronic pain.  Consider joining a chronic pain support group.  Try to control or lower your stress levels. Talk to your health care provider about strategies to do this. General instructions   Take over-the-counter and prescription medicines only as told by your health care provider.  Follow your treatment plan as told by your health care provider. This may include: ? Gentle, regular exercise. ? Eating a healthy diet that includes foods such as vegetables, fruits, fish, and lean meats. ? Cognitive or behavioral therapy. ? Working with a Community education officer. ? Meditation or yoga. ? Acupuncture or massage therapy. ? Aroma, color, light, or sound therapy. ? Local electrical  stimulation. ? Shots (injections) of numbing or pain-relieving medicines into the spine or the area of pain.  Check your pain level as told by your health care provider. Ask your health care provider if you should use a pain scale.  Learn as much as you can about how to manage your chronic pain. Ask your health care provider if an intensive pain rehabilitation program or a chronic pain specialist would be helpful.  Keep all follow-up visits as told by your health care provider. This is important. Contact a health care provider if:  Your pain gets worse.  You have new pain.  You have trouble sleeping.  You have trouble doing your normal activities.  Your pain is not controlled with treatment.  Your have side effects from pain medicine.  You feel weak. Get help right away if:  You lose feeling or have numbness in your body.  You lose control of bowel or bladder function.  Your pain suddenly gets much worse.  You develop shaking or chills.  You develop confusion.  You develop chest pain.  You have trouble breathing or shortness of breath.  You pass out.  You have thoughts about hurting yourself or others. This information is not intended to replace advice given to you by your health care provider. Make sure you discuss any questions you have with your health care provider. Document Released: 03/17/2002 Document Revised: 02/23/2016 Document Reviewed: 12/13/2015 Elsevier Interactive Patient Education  2017 [employer].

## 2017-06-03 NOTE — Progress Notes (Signed)
Subjective: CC: Chronic pain management HPI: Sherry Cox is a 56 y.o. female presenting to clinic today for:  1. Chronic pain management Patient presenting to clinic for follow up on chronic pain management.  Patient reports that they are doing well on Norco 50m TID.  They take 3 total tablets daily.  Patient reports that pain in their polyarthralgia is well controlled.  Pain is 3/10 today. Off of medication it is a 9/10.  Denies somnolence, dizziness, difficulty breathing, constipation, falls.      She wonders if she possibly has fibromyalgia.  She is interested in potentially being evaluated for this if her autoimmune workup is negative.  2.  Cold symptoms  Patient reports sinus pressure, nasal stuffiness and malaise that started a week ago.  Denies cough, hemoptysis, rhinorrhea,  headache, SOB, dizziness, rash, nausea, vomiting, diarrhea, fevers, chills, myalgia, sick contacts, recent travel.  Patient has used NyQuil, plain Robitussin and nasal saline rinses with minimal relief of symptoms.  Denies history of COPD or asthma.  Denies tobacco use/ exposure.  3. Hypertension Patient reports Blood pressure at home: 138/60s; she notes that this is often what it will run after her hemodialysis; she reports that she has not been taking both her blood pressure medications over the last few days because she has had a URI.  Her medications include Norvasc 136mand metoprolol 50 mg.  Side effects: None  ROS: Denies headache, dizziness, visual changes, nausea, vomiting, chest pain, LE swelling, abdominal pain or shortness of breath.  Social Hx: non smoker.  Family History  Problem Relation Age of Onset  . Depression Sister   . Dementia Sister   . OCD Sister   . Diabetes Sister   . Hypertension Sister   . Heart attack Sister   . Bipolar disorder Brother   . Drug abuse Brother   . Diabetes Brother   . Hypertension Brother   . Heart disease Brother   . Hemochromatosis Brother   .  Bipolar disorder Other   . ADD / ADHD Other   . Seizures Other   . Bipolar disorder Other   . Alcohol abuse Other   . Dementia Mother   . Heart disease Mother   . Hypertension Mother   . Heart attack Mother   . Alcohol abuse Father   . Heart disease Father        Heart Disease before age 56. Hypertension Father   . Heart attack Father   . Dementia Maternal Aunt   . OCD Sister   . Heart disease Sister        Heart Disease before age 578. Diabetes Sister   . Hemochromatosis Sister   . Hypertension Sister   . Rheum arthritis Sister   . Heart attack Brother   . COPD Brother   . Heart attack Brother   . Paranoid behavior Neg Hx   . Schizophrenia Neg Hx   . Sexual abuse Neg Hx   . Physical abuse Neg Hx    Past Medical History:  Diagnosis Date  . Ankle injury   . Anxiety   . Anxiety disorder   . CAD (coronary artery disease)    [facility] RCA[facility]no ischemia, EF 55%; h/o CABG  . Carotid artery stenosis    12/10/2008 doppler 5032-20%ICA, 0-2-54%ICA/see evaluation by Dr. FiOneida Alar011  . Ejection fraction    EF 60-65%, echo, June, 2009  . [facility]  Transplant consideration  . Hemochromatosis   . Hx of CABG    Off pump LIMA to LAD  05/2006  . Hx of tobacco use, presenting hazards to health 04/26/2012  . Hyperlipidemia   . Irregular heart beat   . Murmur    October, 2012  . Neuropathy   . Overweight(278.02)   . Tobacco abuse    Allergies  Allergen Reactions  . Augmentin [Amoxicillin-Pot Clavulanate] Nausea And Vomiting    Vomiting    Current Outpatient Medications:  .  albuterol (PROAIR HFA) 108 (90 Base) MCG/ACT inhaler, Inhale 2 puffs into the lungs every 4 (four) hours as needed. For wheezing, Disp: 1 Inhaler, Rfl: 0 .  amLODipine (NORVASC) 10 MG tablet, Take 1 tablet (10 mg total) by mouth daily. (Patient taking differently: Take 10 mg by mouth at bedtime. ), Disp: 90 tablet, Rfl: 3 .  Artificial Tear Ointment (DRY EYES OP),  Place 1 drop into both eyes daily., Disp: , Rfl:  .  aspirin EC 81 MG tablet, Take 81 mg by mouth 2 (two) times daily., Disp: , Rfl:  .  atorvastatin (LIPITOR) 80 MG tablet, TAKE ONE TABLET BY MOUTH DAILY., Disp: 30 tablet, Rfl: 3 .  clonazePAM (KLONOPIN) 0.5 MG tablet, Take 1 tablet (0.5 mg total) by mouth 4 (four) times daily., Disp: 120 tablet, Rfl: 2 .  folic acid (FOLVITE) 1 MG tablet, Take 1 tablet (1 mg total) by mouth daily., Disp: 90 tablet, Rfl: 4 .  folic acid-vitamin b complex-vitamin c-selenium-zinc (DIALYVITE) 3 MG TABS, Take 1 tablet by mouth daily.  , Disp: , Rfl:  .  HYDROcodone-acetaminophen (NORCO) 5-325 MG tablet, Take 1 tablet by mouth every 6 (six) hours as needed for moderate pain., Disp: 90 tablet, Rfl: 0 .  ibuprofen (ADVIL,MOTRIN) 200 MG tablet, Take 400 mg by mouth every 6 (six) hours as needed for mild pain., Disp: , Rfl:  .  levothyroxine (SYNTHROID, LEVOTHROID) 175 MCG tablet, Take 1 tablet (175 mcg total) by mouth daily before breakfast., Disp: 90 tablet, Rfl: 1 .  metoprolol tartrate (LOPRESSOR) 50 MG tablet, TAKE ONE TABLET BY MOUTH TWICE DAILY ON MONDAY, WEDNESDAY AND FRIDAY. TAKE ONE TABLET ON REMAINING DAYS., Disp: 70 tablet, Rfl: 3 .  nitroGLYCERIN (NITROSTAT) 0.4 MG SL tablet, DISSOLVE ONE TABLET UNDER TONGUE EVERY 5 MINUTES UP TO 3 DOSES AS NEEDED FOR CHEST PAIN., Disp: 50 tablet, Rfl: 6 .  OXYGEN, Inhale into the lungs. On 2, Disp: , Rfl:  .  sertraline (ZOLOFT) 100 MG tablet, Take 1 tablet (100 mg total) by mouth daily., Disp: 90 tablet, Rfl: 3 .  sevelamer (RENVELA) 800 MG tablet, Take 1,600-4,000 mg by mouth as directed. Take 5 tablets (4042m) by mouth with meals and 1600 tablets by mouth with snacks., Disp: , Rfl:  .  sodium bicarbonate 650 MG tablet, Take 650 mg by mouth 2 (two) times daily. , Disp: , Rfl:  .  sodium chloride (OCEAN) 0.65 % SOLN nasal spray, Place 1 spray into both nostrils as needed for congestion., Disp: , Rfl:  .  NARCAN 4 MG/0.1ML  LIQD nasal spray kit, Place 1 spray into the nose once as needed. overdose, Disp: , Rfl: 0   ROS: Per HPI  Objective: Office vital signs reviewed. BP (!) 179/78   Pulse 76   Temp (!) 97.3 F (36.3 C) (Oral)   Ht '4\' 10"'  (1.473 m)   Wt 175 lb (79.4 kg)   BMI 36.58 kg/m   Physical Examination:  General:  Awake, alert, well nourished, No acute distress HEENT: Normal    Neck: No masses palpated. No lymphadenopathy    Ears: Tympanic membranes intact, normal light reflex, no erythema, no bulging    Eyes: PERRLA, extraocular movement in tact, sclera white    Nose: nasal turbinates moist, clear nasal discharge    Throat: moist mucus membranes, mild oropharyngeal erythema, no tonsillar exudate.  Airway is patent Cardio: regular rate and rhythm, S1S2 heard, no murmurs appreciated; left upper extremity with patent AV fistula Pulm: clear to auscultation bilaterally, no wheezes, rhonchi or rales; normal work of breathing on room air MSK: Slow gait, requires rolling walker for ambulation.  No joint swelling, erythema or joint effusions appreciated. Neuro: Alert and oriented x3, follows all commands.  Assessment/ Plan: 56 y.o. female   Chronic pain syndrome U tox obtained today.  Norco refilled times 1 month.  The [place] Narcotic Database has been reviewed.  There were no red flags.  Norco refills were appropriate.  She does have Klonopin, which is prescribed by her psychiatrist Dr. Harrington Challenger.  Pain continues to be well-controlled with current regimen.  For this reason, we will continue with current regimen until the risks outweigh the benefits.  Autoimmune workup obtained today.  Will contact patient with results.  If negative, will plan to clinically evaluate patient for fibromyalgia.  Follow-up in 1 month.   Benign hypertension with ESRD (end-stage renal disease) ([place]) Blood pressure not at goal.  Patient has not been taking her medications appropriately over the last couple of days.  I did advise that  if blood pressure remains persistently elevated, we will need to add additional medication.  It does not sound that she is dropping too low during hemodialysis.  She will follow-up in 1 month for this.  Polyarthralgia - Sedimentation Rate - Rheumatoid factor - ANA w/Reflex if Positive - C-reactive protein  Viral upper respiratory tract infection No evidence of bacterial infection on today's exam.  She is afebrile and well-appearing.  Continue sinus rinses with saline.  I have added Flonase nasal spray and reviewed how to use this medication with the patient.  If she develops any signs or symptoms of bacterial infection, she will contact me and I will send an appropriate antibiotic.  For now, continue supportive care at home.   Janora Norlander, DO [place] [phone]

## 2017-06-04 DIAGNOSIS — N2581 Secondary hyperparathyroidism of renal origin: Secondary | ICD-10-CM | POA: Diagnosis not present

## 2017-06-04 DIAGNOSIS — D631 Anemia in chronic kidney disease: Secondary | ICD-10-CM | POA: Diagnosis not present

## 2017-06-04 DIAGNOSIS — D509 Iron deficiency anemia, unspecified: Secondary | ICD-10-CM | POA: Diagnosis not present

## 2017-06-04 DIAGNOSIS — N186 End stage renal disease: Secondary | ICD-10-CM | POA: Diagnosis not present

## 2017-06-04 DIAGNOSIS — Z992 Dependence on renal dialysis: Secondary | ICD-10-CM | POA: Diagnosis not present

## 2017-06-04 LAB — ANA W/REFLEX IF POSITIVE: Anti Nuclear Antibody(ANA): NEGATIVE

## 2017-06-04 LAB — RHEUMATOID FACTOR: Rhuematoid fact SerPl-aCnc: <10 IU/mL (ref 0.0–13.9)

## 2017-06-04 LAB — SEDIMENTATION RATE: SED RATE: 28 mm/h (ref 0–40)

## 2017-06-04 LAB — C-REACTIVE PROTEIN: CRP: 11.6 mg/L — AB (ref 0.0–4.9)

## 2017-06-06 DIAGNOSIS — Z992 Dependence on renal dialysis: Secondary | ICD-10-CM | POA: Diagnosis not present

## 2017-06-06 DIAGNOSIS — D509 Iron deficiency anemia, unspecified: Secondary | ICD-10-CM | POA: Diagnosis not present

## 2017-06-06 DIAGNOSIS — D631 Anemia in chronic kidney disease: Secondary | ICD-10-CM | POA: Diagnosis not present

## 2017-06-06 DIAGNOSIS — N2581 Secondary hyperparathyroidism of renal origin: Secondary | ICD-10-CM | POA: Diagnosis not present

## 2017-06-06 DIAGNOSIS — N186 End stage renal disease: Secondary | ICD-10-CM | POA: Diagnosis not present

## 2017-06-07 DIAGNOSIS — M25561 Pain in right knee: Secondary | ICD-10-CM | POA: Diagnosis not present

## 2017-06-07 DIAGNOSIS — S82031D Displaced transverse fracture of right patella, subsequent encounter for closed fracture with routine healing: Secondary | ICD-10-CM | POA: Diagnosis not present

## 2017-06-07 LAB — TOXASSURE SELECT 13 (MW), URINE

## 2017-06-08 DIAGNOSIS — N2581 Secondary hyperparathyroidism of renal origin: Secondary | ICD-10-CM | POA: Diagnosis not present

## 2017-06-08 DIAGNOSIS — Z992 Dependence on renal dialysis: Secondary | ICD-10-CM | POA: Diagnosis not present

## 2017-06-08 DIAGNOSIS — D631 Anemia in chronic kidney disease: Secondary | ICD-10-CM | POA: Diagnosis not present

## 2017-06-08 DIAGNOSIS — N186 End stage renal disease: Secondary | ICD-10-CM | POA: Diagnosis not present

## 2017-06-10 ENCOUNTER — Other Ambulatory Visit: Payer: Self-pay | Admitting: Family Medicine

## 2017-06-10 DIAGNOSIS — Z4789 Encounter for other orthopedic aftercare: Secondary | ICD-10-CM | POA: Diagnosis not present

## 2017-06-10 DIAGNOSIS — S82031D Displaced transverse fracture of right patella, subsequent encounter for closed fracture with routine healing: Secondary | ICD-10-CM | POA: Diagnosis not present

## 2017-06-11 ENCOUNTER — Encounter: Payer: Self-pay | Admitting: Family Medicine

## 2017-06-11 DIAGNOSIS — N186 End stage renal disease: Secondary | ICD-10-CM | POA: Diagnosis not present

## 2017-06-11 DIAGNOSIS — Z992 Dependence on renal dialysis: Secondary | ICD-10-CM | POA: Diagnosis not present

## 2017-06-11 DIAGNOSIS — D631 Anemia in chronic kidney disease: Secondary | ICD-10-CM | POA: Diagnosis not present

## 2017-06-11 DIAGNOSIS — N2581 Secondary hyperparathyroidism of renal origin: Secondary | ICD-10-CM | POA: Diagnosis not present

## 2017-06-12 ENCOUNTER — Encounter: Payer: Self-pay | Admitting: Cardiology

## 2017-06-12 ENCOUNTER — Ambulatory Visit (INDEPENDENT_AMBULATORY_CARE_PROVIDER_SITE_OTHER): Payer: Medicare Other | Admitting: Cardiology

## 2017-06-12 ENCOUNTER — Telehealth: Payer: Self-pay | Admitting: Cardiology

## 2017-06-12 ENCOUNTER — Other Ambulatory Visit: Payer: Self-pay

## 2017-06-12 VITALS — BP 164/74 | HR 73 | Ht <= 58 in | Wt 175.0 lb

## 2017-06-12 DIAGNOSIS — I6523 Occlusion and stenosis of bilateral carotid arteries: Secondary | ICD-10-CM | POA: Diagnosis not present

## 2017-06-12 DIAGNOSIS — R011 Cardiac murmur, unspecified: Secondary | ICD-10-CM

## 2017-06-12 DIAGNOSIS — I2510 Atherosclerotic heart disease of native coronary artery without angina pectoris: Secondary | ICD-10-CM | POA: Diagnosis not present

## 2017-06-12 NOTE — Telephone Encounter (Signed)
ECHO & CAROTID scheduled in [facility] on Dec 19,2018

## 2017-06-12 NOTE — Patient Instructions (Signed)
Your physician wants you to follow-up in: [place] will receive a reminder letter in the mail two months in advance. If you don't receive a letter, please call our office to schedule the follow-up appointment.  Your physician recommends that you continue on your current medications as directed. Please refer to the Current Medication list given to you today.  Your physician has requested that you have an echocardiogram. Echocardiography is a painless test that uses sound waves to create images of your heart. It provides your doctor with information about the size and shape of your heart and how well your heart's chambers and valves are working. This procedure takes approximately one hour. There are no restrictions for this procedure.  Your physician has requested that you have a carotid duplex. This test is an ultrasound of the carotid arteries in your neck. It looks at blood flow through these arteries that supply the brain with blood. Allow one hour for this exam. There are no restrictions or special instructions.  Thank you for choosing [facility]!!

## 2017-06-12 NOTE — Progress Notes (Signed)
Clinical Summary Ms. Diekmann is a 56 y.o.female seen today for follow up of the following medical problems.   1. CAD - prior CABG in 2007 - denies any recent chest pain.  - compliant with meds   - denies any chest pain. No recent SOB/DOE - compliant with meds  2. Carotid stenosis - followed by vascular  3. ESRD - compliant with HD, however missed yesterdays session due to recent health issues with her brother - HD T,TH,Sat  4. Hyperlipidemia - compliant with statin  5. HTN  - she reports bps at HD initially 170s, at end of sessions 140s/60s  Past Medical History:  Diagnosis Date  . Ankle injury   . Anxiety   . Anxiety disorder   . CAD (coronary artery disease)    Total RCA, stress echo [facility] 11/2008-no ischemia, EF 55%; h/o CABG  . Carotid artery stenosis    12/10/2008 doppler 83-41% RICA, 9-62% LICA/see evaluation by Dr. Oneida Alar 2011  . Ejection fraction    EF 60-65%, echo, June, 2009  . ESRD on dialysis [facility])    Transplant consideration  . Hemochromatosis   . Hx of CABG    Off pump LIMA to LAD  05/2006  . Hx of tobacco use, presenting hazards to health 04/26/2012  . Hyperlipidemia   . Irregular heart beat   . Murmur    October, 2012  . Neuropathy   . Overweight(278.02)   . Tobacco abuse      Allergies  Allergen Reactions  . Augmentin [Amoxicillin-Pot Clavulanate] Nausea And Vomiting    Vomiting     Current Outpatient Medications  Medication Sig Dispense Refill  . amLODipine (NORVASC) 10 MG tablet Take 1 tablet (10 mg total) by mouth daily. (Patient taking differently: Take 10 mg by mouth at bedtime. ) 90 tablet 3  . Artificial Tear Ointment (DRY EYES OP) Place 1 drop into both eyes daily.    Marland Kitchen aspirin EC 81 MG tablet Take 81 mg by mouth 2 (two) times daily.    Marland Kitchen atorvastatin (LIPITOR) 80 MG tablet TAKE ONE TABLET BY MOUTH DAILY. 30 tablet 3  . clonazePAM (KLONOPIN) 0.5 MG tablet Take 1 tablet (0.5 mg total) by mouth 4 (four) times daily.  120 tablet 2  . fluticasone (FLONASE) 50 MCG/ACT nasal spray Place 2 sprays into both nostrils daily. 16 g 6  . folic acid (FOLVITE) 1 MG tablet Take 1 tablet (1 mg total) by mouth daily. 90 tablet 4  . folic acid-vitamin b complex-vitamin c-selenium-zinc (DIALYVITE) 3 MG TABS Take 1 tablet by mouth daily.      Marland Kitchen HYDROcodone-acetaminophen (NORCO) 5-325 MG tablet Take 1 tablet by mouth every 6 (six) hours as needed for moderate pain. 90 tablet 0  . ibuprofen (ADVIL,MOTRIN) 200 MG tablet Take 400 mg by mouth every 6 (six) hours as needed for mild pain.    Marland Kitchen levothyroxine (SYNTHROID, LEVOTHROID) 175 MCG tablet Take 1 tablet (175 mcg total) by mouth daily before breakfast. 90 tablet 1  . metoprolol tartrate (LOPRESSOR) 50 MG tablet TAKE ONE TABLET BY MOUTH TWICE DAILY ON MONDAY, WEDNESDAY AND FRIDAY. TAKE ONE TABLET ON REMAINING DAYS. 70 tablet 3  . NARCAN 4 MG/0.1ML LIQD nasal spray kit Place 1 spray into the nose once as needed. overdose  0  . nitroGLYCERIN (NITROSTAT) 0.4 MG SL tablet DISSOLVE ONE TABLET UNDER TONGUE EVERY 5 MINUTES UP TO 3 DOSES AS NEEDED FOR CHEST PAIN. 50 tablet 6  . OXYGEN Inhale into  the lungs. On 2    . sertraline (ZOLOFT) 100 MG tablet Take 1 tablet (100 mg total) by mouth daily. 90 tablet 3  . sevelamer (RENVELA) 800 MG tablet Take 1,600-4,000 mg by mouth as directed. Take 5 tablets (4059m) by mouth with meals and 1600 tablets by mouth with snacks.    . sodium bicarbonate 650 MG tablet Take 650 mg by mouth 2 (two) times daily.     . sodium chloride (OCEAN) 0.65 % SOLN nasal spray Place 1 spray into both nostrils as needed for congestion.    . VENTOLIN HFA 108 (90 Base) MCG/ACT inhaler INHALE TWO PUFFS INTO THE LUNGS EVERY 4 HOURS AS NEEDED FOR FOR WHEEZING. 18 g 2   No current facility-administered medications for this visit.      Past Surgical History:  Procedure Laterality Date  . AV FISTULA PLACEMENT     [facility] . CARDIAC  VALVE SURGERY  2007  . CAROTID ANGIOGRAM Bilateral 03/06/2013   Procedure: CAROTID ANGIOGRAM;  Surgeon: CElam Dutch MD;  [facility];  Service: Cardiovascular;  Laterality: Bilateral;  . CHOLECYSTECTOMY  1995   Gall Bladder  . CORONARY ARTERY BYPASS GRAFT  05/2006   Off pump LIMA to LAD  . INNER EAR SURGERY    . ORIF PATELLA Right 03/11/2017   Procedure: OPEN REDUCTION INTERNAL (ORIF) FIXATION PATELLA;  Surgeon: SRod Can MD;  [place]  Service: [place]ics;  Laterality: Right;  . Plastic Surgery on leg       Allergies  Allergen Reactions  . Augmentin [Amoxicillin-Pot Clavulanate] Nausea And Vomiting    Vomiting      Family History  Problem Relation Age of Onset  . Depression Sister   . Dementia Sister   . OCD Sister   . Diabetes Sister   . Hypertension Sister   . Heart attack Sister   . Bipolar disorder Brother   . Drug abuse Brother   . Diabetes Brother   . Hypertension Brother   . Heart disease Brother   . Hemochromatosis Brother   . Bipolar disorder Other   . ADD / ADHD Other   . Seizures Other   . Bipolar disorder Other   . Alcohol abuse Other   . Dementia Mother   . Heart disease Mother   . Hypertension Mother   . Heart attack Mother   . Alcohol abuse Father   . Heart disease Father        Heart Disease before age 56 . Hypertension Father   . Heart attack Father   . Dementia Maternal Aunt   . OCD Sister   . Heart disease Sister        Heart Disease before age 56 . Diabetes Sister   . Hemochromatosis Sister   . Hypertension Sister   . Rheum arthritis Sister   . Heart attack Brother   . COPD Brother   . Heart attack Brother   . Paranoid behavior Neg Hx   . Schizophrenia Neg Hx   . Sexual abuse Neg Hx   . Physical abuse Neg Hx      Social History Ms. Oehler reports that she has been smoking cigarettes.  She started smoking about 45 years ago. She has a 9.00 pack-year smoking history. she has never used smokeless  tobacco. Ms. EConcepcionreports that she does not drink alcohol.   Review of Systems CONSTITUTIONAL: No weight loss, fever, chills,  weakness or fatigue.  HEENT: Eyes: No visual loss, blurred vision, double vision or yellow sclerae.No hearing loss, sneezing, congestion, runny nose or sore throat.  SKIN: No rash or itching.  CARDIOVASCULAR: per hpi RESPIRATORY: No shortness of breath, cough or sputum.  GASTROINTESTINAL: No anorexia, nausea, vomiting or diarrhea. No abdominal pain or blood.  GENITOURINARY: No burning on urination, no polyuria NEUROLOGICAL: No headache, dizziness, syncope, paralysis, ataxia, numbness or tingling in the extremities. No change in bowel or bladder control.  MUSCULOSKELETAL: No muscle, back pain, joint pain or stiffness.  LYMPHATICS: No enlarged nodes. No history of splenectomy.  PSYCHIATRIC: No history of depression or anxiety.  ENDOCRINOLOGIC: No reports of sweating, cold or heat intolerance. No polyuria or polydipsia.  Marland Kitchen   Physical Examination Vitals:   06/12/17 1108  BP: (!) 164/74  Pulse: 73  SpO2: 98%   Vitals:   06/12/17 1108  Weight: 175 lb (79.4 kg)  Height: '4\' 10"'  (1.473 m)    Gen: resting comfortably, no acute distress HEENT: no scleral icterus, pupils equal round and reactive, no palptable cervical adenopathy,  CV: RRR, 3/6 systolic murmur rusb, no jvd Resp: Clear to auscultation bilaterally GI: abdomen is soft, non-tender, non-distended, normal bowel sounds, no hepatosplenomegaly MSK: extremities are warm, no edema.  Skin: warm, no rash Neuro:  no focal deficits Psych: appropriate affect   Diagnostic Studies 04/2011 echo Study Conclusions  - Left ventricle: The cavity size was normal. Systolic function was normal. The estimated ejection fraction was in the range of 60% to 65%. Wall motion was normal; there were no regional wall motion abnormalities. Doppler parameters are consistent with abnormal left  ventricular relaxation (grade 1 diastolic dysfunction). - Aortic valve: Valve area: 1.38cm^2(VTI). Valve area: 1.13cm^2 (Vmax).   01/2015 Carotid US RICA 40-59%, left bifurcatoin 40-59%    Assessment and Plan   1. CAD - asymptomatic, continue current meds  2. Carotid stenosis - continue to follow with vascular - repeat carotid US  3. HTN - elevated in clinic, bps' after her dialysis sessions are at goal. We will continue to monitor at this time  4. Hyperlipidemia - request labs from pcp - continue statin  5. Heart murmur -obtain echo   Arnoldo Lenis, M.D.

## 2017-06-13 DIAGNOSIS — N2581 Secondary hyperparathyroidism of renal origin: Secondary | ICD-10-CM | POA: Diagnosis not present

## 2017-06-13 DIAGNOSIS — Z992 Dependence on renal dialysis: Secondary | ICD-10-CM | POA: Diagnosis not present

## 2017-06-13 DIAGNOSIS — N186 End stage renal disease: Secondary | ICD-10-CM | POA: Diagnosis not present

## 2017-06-13 DIAGNOSIS — D631 Anemia in chronic kidney disease: Secondary | ICD-10-CM | POA: Diagnosis not present

## 2017-06-14 DIAGNOSIS — S82031D Displaced transverse fracture of right patella, subsequent encounter for closed fracture with routine healing: Secondary | ICD-10-CM | POA: Diagnosis not present

## 2017-06-14 DIAGNOSIS — M25561 Pain in right knee: Secondary | ICD-10-CM | POA: Diagnosis not present

## 2017-06-15 ENCOUNTER — Encounter: Payer: Self-pay | Admitting: Cardiology

## 2017-06-15 DIAGNOSIS — D631 Anemia in chronic kidney disease: Secondary | ICD-10-CM | POA: Diagnosis not present

## 2017-06-15 DIAGNOSIS — N2581 Secondary hyperparathyroidism of renal origin: Secondary | ICD-10-CM | POA: Diagnosis not present

## 2017-06-15 DIAGNOSIS — N186 End stage renal disease: Secondary | ICD-10-CM | POA: Diagnosis not present

## 2017-06-15 DIAGNOSIS — Z992 Dependence on renal dialysis: Secondary | ICD-10-CM | POA: Diagnosis not present

## 2017-06-19 DIAGNOSIS — D631 Anemia in chronic kidney disease: Secondary | ICD-10-CM | POA: Diagnosis not present

## 2017-06-19 DIAGNOSIS — N2581 Secondary hyperparathyroidism of renal origin: Secondary | ICD-10-CM | POA: Diagnosis not present

## 2017-06-19 DIAGNOSIS — Z992 Dependence on renal dialysis: Secondary | ICD-10-CM | POA: Diagnosis not present

## 2017-06-19 DIAGNOSIS — N186 End stage renal disease: Secondary | ICD-10-CM | POA: Diagnosis not present

## 2017-06-20 DIAGNOSIS — Z992 Dependence on renal dialysis: Secondary | ICD-10-CM | POA: Diagnosis not present

## 2017-06-20 DIAGNOSIS — N186 End stage renal disease: Secondary | ICD-10-CM | POA: Diagnosis not present

## 2017-06-20 DIAGNOSIS — D631 Anemia in chronic kidney disease: Secondary | ICD-10-CM | POA: Diagnosis not present

## 2017-06-20 DIAGNOSIS — N2581 Secondary hyperparathyroidism of renal origin: Secondary | ICD-10-CM | POA: Diagnosis not present

## 2017-06-22 DIAGNOSIS — N2581 Secondary hyperparathyroidism of renal origin: Secondary | ICD-10-CM | POA: Diagnosis not present

## 2017-06-22 DIAGNOSIS — Z992 Dependence on renal dialysis: Secondary | ICD-10-CM | POA: Diagnosis not present

## 2017-06-22 DIAGNOSIS — D631 Anemia in chronic kidney disease: Secondary | ICD-10-CM | POA: Diagnosis not present

## 2017-06-22 DIAGNOSIS — N186 End stage renal disease: Secondary | ICD-10-CM | POA: Diagnosis not present

## 2017-06-24 DIAGNOSIS — S82031D Displaced transverse fracture of right patella, subsequent encounter for closed fracture with routine healing: Secondary | ICD-10-CM | POA: Diagnosis not present

## 2017-06-24 DIAGNOSIS — M25561 Pain in right knee: Secondary | ICD-10-CM | POA: Diagnosis not present

## 2017-06-25 DIAGNOSIS — N186 End stage renal disease: Secondary | ICD-10-CM | POA: Diagnosis not present

## 2017-06-25 DIAGNOSIS — Z992 Dependence on renal dialysis: Secondary | ICD-10-CM | POA: Diagnosis not present

## 2017-06-25 DIAGNOSIS — N2581 Secondary hyperparathyroidism of renal origin: Secondary | ICD-10-CM | POA: Diagnosis not present

## 2017-06-25 DIAGNOSIS — D631 Anemia in chronic kidney disease: Secondary | ICD-10-CM | POA: Diagnosis not present

## 2017-06-26 ENCOUNTER — Other Ambulatory Visit: Payer: Self-pay

## 2017-06-27 DIAGNOSIS — D631 Anemia in chronic kidney disease: Secondary | ICD-10-CM | POA: Diagnosis not present

## 2017-06-27 DIAGNOSIS — N186 End stage renal disease: Secondary | ICD-10-CM | POA: Diagnosis not present

## 2017-06-27 DIAGNOSIS — Z992 Dependence on renal dialysis: Secondary | ICD-10-CM | POA: Diagnosis not present

## 2017-06-27 DIAGNOSIS — N2581 Secondary hyperparathyroidism of renal origin: Secondary | ICD-10-CM | POA: Diagnosis not present

## 2017-06-28 DIAGNOSIS — M25561 Pain in right knee: Secondary | ICD-10-CM | POA: Diagnosis not present

## 2017-06-28 DIAGNOSIS — S82031D Displaced transverse fracture of right patella, subsequent encounter for closed fracture with routine healing: Secondary | ICD-10-CM | POA: Diagnosis not present

## 2017-06-29 DIAGNOSIS — Z992 Dependence on renal dialysis: Secondary | ICD-10-CM | POA: Diagnosis not present

## 2017-06-29 DIAGNOSIS — D631 Anemia in chronic kidney disease: Secondary | ICD-10-CM | POA: Diagnosis not present

## 2017-06-29 DIAGNOSIS — N2581 Secondary hyperparathyroidism of renal origin: Secondary | ICD-10-CM | POA: Diagnosis not present

## 2017-06-29 DIAGNOSIS — N186 End stage renal disease: Secondary | ICD-10-CM | POA: Diagnosis not present

## 2017-07-01 DIAGNOSIS — N2581 Secondary hyperparathyroidism of renal origin: Secondary | ICD-10-CM | POA: Diagnosis not present

## 2017-07-01 DIAGNOSIS — N186 End stage renal disease: Secondary | ICD-10-CM | POA: Diagnosis not present

## 2017-07-01 DIAGNOSIS — D631 Anemia in chronic kidney disease: Secondary | ICD-10-CM | POA: Diagnosis not present

## 2017-07-01 DIAGNOSIS — Z992 Dependence on renal dialysis: Secondary | ICD-10-CM | POA: Diagnosis not present

## 2017-07-03 NOTE — Progress Notes (Signed)
Subjective: CC: Chronic pain management HPI: Sherry Cox is a 56 y.o. female presenting to clinic today for:  1. Chronic pain management Patient presenting to clinic for follow up on chronic pain management.  Patient reports that they had been doing well on Norco 53m.  She takes 3 tablets daily.  Patient reports that polyarthralgia is not as well controlled as before.  She had an autoimmune workup given multiple affected joints which was essentially negative except for a mildly elevated CRP.  Of note, she also had a URI at that time.  Pain is 7.5/10 today.  Denies somnolence, dizziness, difficulty breathing.      Additionally, she had a urine drug screen which was negative for Norco but positive for benzos.  This was unexpected as patient reported that had been taking the medication regularly and had taken both an evening dose and a morning dose following hemodialysis the day before.  She notes that she has taken her hydrocodone this morning as well.  2. Hypertension Patient reports that she forgot to take her blood pressure medication today. Denies headache, dizziness, visual changes, nausea, vomiting, chest pain, LE swelling, abdominal pain or shortness of breath.  3.  Lump Patient reports a lump on her left shoulder that is been present for several years.  She notes that this was previously evaluated by her PCP in the past and was told that this was benign.  She denies increased growth, pain, discoloration, unplanned weight loss.  ROS: Per HPI  Past Medical History:  Diagnosis Date  . Ankle injury   . Anxiety   . Anxiety disorder   . CAD (coronary artery disease)    Total [facility]CA[facility]no ischemia, EF 55%; h/o CABG  . Carotid artery stenosis    12/10/2008 doppler 503-88%RICA, 08-28%LICA/see evaluation by Dr. FOneida Alar2011  . Ejection fraction    EF 60-65%, echo, June, 2009  . ESRD on dialysis ([facility]    Transplant consideration  . Hemochromatosis   . Hx of CABG      Off pump LIMA to LAD  05/2006  . Hx of tobacco use, presenting hazards to health 04/26/2012  . Hyperlipidemia   . Irregular heart beat   . Murmur    October, 2012  . Neuropathy   . Overweight(278.02)   . Tobacco abuse    Allergies  Allergen Reactions  . Augmentin [Amoxicillin-Pot Clavulanate] Nausea And Vomiting    Vomiting    Current Outpatient Medications:  .  amLODipine (NORVASC) 10 MG tablet, Take 1 tablet (10 mg total) by mouth daily. (Patient taking differently: Take 10 mg by mouth at bedtime. ), Disp: 90 tablet, Rfl: 3 .  Artificial Tear Ointment (DRY EYES OP), Place 1 drop into both eyes daily., Disp: , Rfl:  .  aspirin EC 81 MG tablet, Take 81 mg by mouth 2 (two) times daily., Disp: , Rfl:  .  atorvastatin (LIPITOR) 80 MG tablet, TAKE ONE TABLET BY MOUTH DAILY., Disp: 30 tablet, Rfl: 3 .  clonazePAM (KLONOPIN) 0.5 MG tablet, Take 1 tablet (0.5 mg total) by mouth 4 (four) times daily., Disp: 120 tablet, Rfl: 2 .  fluticasone (FLONASE) 50 MCG/ACT nasal spray, Place 2 sprays into both nostrils daily., Disp: 16 g, Rfl: 6 .  folic acid (FOLVITE) 1 MG tablet, Take 1 tablet (1 mg total) by mouth daily., Disp: 90 tablet, Rfl: 4 .  folic acid-vitamin b complex-vitamin c-selenium-zinc (DIALYVITE) 3 MG TABS, Take 1 tablet by mouth daily.  ,  Disp: , Rfl:  .  HYDROcodone-acetaminophen (NORCO) 5-325 MG tablet, Take 1 tablet by mouth every 6 (six) hours as needed for moderate pain., Disp: 90 tablet, Rfl: 0 .  ibuprofen (ADVIL,MOTRIN) 200 MG tablet, Take 400 mg by mouth every 6 (six) hours as needed for mild pain., Disp: , Rfl:  .  levothyroxine (SYNTHROID, LEVOTHROID) 175 MCG tablet, Take 1 tablet (175 mcg total) by mouth daily before breakfast., Disp: 90 tablet, Rfl: 1 .  metoprolol tartrate (LOPRESSOR) 50 MG tablet, TAKE ONE TABLET BY MOUTH TWICE DAILY ON MONDAY, WEDNESDAY AND FRIDAY. TAKE ONE TABLET ON REMAINING DAYS., Disp: 70 tablet, Rfl: 3 .  NARCAN 4 MG/0.1ML LIQD nasal spray kit,  Place 1 spray into the nose once as needed. overdose, Disp: , Rfl: 0 .  nitroGLYCERIN (NITROSTAT) 0.4 MG SL tablet, DISSOLVE ONE TABLET UNDER TONGUE EVERY 5 MINUTES UP TO 3 DOSES AS NEEDED FOR CHEST PAIN., Disp: 50 tablet, Rfl: 6 .  OXYGEN, Inhale into the lungs. On 2, Disp: , Rfl:  .  sertraline (ZOLOFT) 100 MG tablet, Take 1 tablet (100 mg total) by mouth daily., Disp: 90 tablet, Rfl: 3 .  sevelamer (RENVELA) 800 MG tablet, Take 1,600-4,000 mg by mouth as directed. Take 5 tablets (4046m) by mouth with meals and 1600 tablets by mouth with snacks., Disp: , Rfl:  .  sodium bicarbonate 650 MG tablet, Take 650 mg by mouth 2 (two) times daily. , Disp: , Rfl:  .  sodium chloride (OCEAN) 0.65 % SOLN nasal spray, Place 1 spray into both nostrils as needed for congestion., Disp: , Rfl:  .  VENTOLIN HFA 108 (90 Base) MCG/ACT inhaler, INHALE TWO PUFFS INTO THE LUNGS EVERY 4 HOURS AS NEEDED FOR FOR WHEEZING., Disp: 18 g, Rfl: 2 Social History   Socioeconomic History  . Marital status: Married    Spouse name: Not on file  . Number of children: Not on file  . Years of education: Not on file  . Highest education level: Not on file  Social Needs  . Financial resource strain: Not on file  . Food insecurity - worry: Not on file  . Food insecurity - inability: Not on file  . Transportation needs - medical: Not on file  . Transportation needs - non-medical: Not on file  Occupational History  . Occupation: DISABLED  Tobacco Use  . Smoking status: Current Every Day Smoker    Packs/day: 0.10    Years: 30.00    Pack years: 3.00    Types: Cigarettes    Start date: 02/02/1972  . Smokeless tobacco: Never Used  . Tobacco comment: 2 cigarettes every 3 days  Substance and Sexual Activity  . Alcohol use: No    Alcohol/week: 0.0 oz  . Drug use: No  . Sexual activity: Yes  Other Topics Concern  . Not on file  Social History Narrative  . Not on file   Family History  Problem Relation Age of Onset  .  Depression Sister   . Dementia Sister   . OCD Sister   . Diabetes Sister   . Hypertension Sister   . Heart attack Sister   . Bipolar disorder Brother   . Drug abuse Brother   . Diabetes Brother   . Hypertension Brother   . Heart disease Brother   . Hemochromatosis Brother   . Bipolar disorder Other   . ADD / ADHD Other   . Seizures Other   . Bipolar disorder Other   . Alcohol  abuse Other   . Dementia Mother   . Heart disease Mother   . Hypertension Mother   . Heart attack Mother   . Alcohol abuse Father   . Heart disease Father        Heart Disease before age 20  . Hypertension Father   . Heart attack Father   . Dementia Maternal Aunt   . OCD Sister   . Heart disease Sister        Heart Disease before age 30  . Diabetes Sister   . Hemochromatosis Sister   . Hypertension Sister   . Rheum arthritis Sister   . Heart attack Brother   . COPD Brother   . Heart attack Brother   . Paranoid behavior Neg Hx   . Schizophrenia Neg Hx   . Sexual abuse Neg Hx   . Physical abuse Neg Hx    Objective: Office vital signs reviewed. BP (!) 162/63   Pulse 79   Temp 99 F (37.2 C) (Oral)   Ht _0  (1.473 m)   Wt 179 lb (81.2 kg)   BMI 37.41 kg/m   Physical Examination:  General: Awake, alert, No acute distress HEENT: Normal    Neck: No masses palpated. No lymphadenopathy Cardio: regular rate and rhythm, S1S2 heard, 2/6 SEM appreciated bilateral sternal borders. Pulm: clear to auscultation bilaterally, no wheezes, rhonchi or rales; normal work of breathing on room air MSK: Moves all extremities independently.  Right lower extremity with brace in place.  She is using a rolling walker for ambulation now.  Spine: She has paraspinal tenderness to palpation throughout the entire spine but with predominance in the upper thoracic area. Neuro: Light touch sensation grossly intact. Skin: Quarter size, well rounded soft tissue mass that is mobile and rubbery in consistency located on  the left posterior shoulder.  Nontender.  No overlying skin changes.  Depression screen [facility] 2/9 07/05/2017 06/03/2017 05/06/2017  Decreased Interest 0 0 0  Down, Depressed, Hopeless - 0 0  PHQ - 2 Score 0 0 0  Some encounter information is confidential and restricted. Go to Review Flowsheets activity to see all data.  Some recent data might be hidden     Assessment/ Plan: 56 y.o. female   Chronic pain syndrome I reviewed patient's last urinary drug screen with her.  Benzodiazepines were present but the Norco was not, this was unexpected given her reports of consistent use.  It is possible that hemodialysis may explain.  However, I would suspect that had she taken the medication that morning of the appointment that it would be present in her system, especially given that the Klonopin was present.  For this reason, I explained to her that I would have to refer her to pain management for further care.  A copy of her UDS was provided to her today.  I refilled her Norco times 1 month while we await for her to establish care with a pain provider.  I reviewed with her and provided her written instructions denoting that this would be the last refill of this controlled substance from this office and that she should be mindful of her use of Norco while she awaits her appointment.  Polyarthralgia Recent autoimmune workup was negative.  CRP was slightly elevated but she also had a URI at the time of measurement.  ANA, RF and ESR were all within normal limits.  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  Lesion was well rounded, mobile.  I offered  referral for excision of lesion.  Patient declined this at this time as it is not bothering her.  Benign hypertension with ESRD (end-stage renal disease) ([place]) Not controlled during today's evaluation.  She has not taken her medication this morning.  I recommended that she take this as soon as she gets home.  She is currently a symptomatic.  The  Narcotic Database has been reviewed.  Patient is currently on chronic clonazepam 0.5 mg prescribed by her psychiatrist.  She has been on this for several years.  Refills of both clonazepam and Norco are appropriate.  There were no red flags.  Patient aware of possible side effects and repercussions of concurrent use of benzodiazepines and opioid medications.  Janora Norlander, DO [place] [phone]

## 2017-07-04 DIAGNOSIS — Z992 Dependence on renal dialysis: Secondary | ICD-10-CM | POA: Diagnosis not present

## 2017-07-04 DIAGNOSIS — N2581 Secondary hyperparathyroidism of renal origin: Secondary | ICD-10-CM | POA: Diagnosis not present

## 2017-07-04 DIAGNOSIS — D631 Anemia in chronic kidney disease: Secondary | ICD-10-CM | POA: Diagnosis not present

## 2017-07-04 DIAGNOSIS — N186 End stage renal disease: Secondary | ICD-10-CM | POA: Diagnosis not present

## 2017-07-05 ENCOUNTER — Ambulatory Visit (INDEPENDENT_AMBULATORY_CARE_PROVIDER_SITE_OTHER): Payer: Medicare Other | Admitting: Family Medicine

## 2017-07-05 ENCOUNTER — Encounter: Payer: Self-pay | Admitting: Family Medicine

## 2017-07-05 VITALS — BP 162/63 | HR 79 | Temp 99.0°F | Ht <= 58 in | Wt 179.0 lb

## 2017-07-05 DIAGNOSIS — M2550 Pain in unspecified joint: Secondary | ICD-10-CM | POA: Diagnosis not present

## 2017-07-05 DIAGNOSIS — G894 Chronic pain syndrome: Secondary | ICD-10-CM | POA: Diagnosis not present

## 2017-07-05 DIAGNOSIS — I120 Hypertensive chronic kidney disease with stage 5 chronic kidney disease or end stage renal disease: Secondary | ICD-10-CM

## 2017-07-05 DIAGNOSIS — N186 End stage renal disease: Secondary | ICD-10-CM | POA: Diagnosis not present

## 2017-07-05 DIAGNOSIS — D171 Benign lipomatous neoplasm of skin and subcutaneous tissue of trunk: Secondary | ICD-10-CM

## 2017-07-05 MED ORDER — HYDROCODONE-ACETAMINOPHEN 5-325 MG PO TABS: 1.0000 | ORAL_TABLET | Freq: Four times a day (QID) | ORAL | 0 refills | Status: DC | PRN

## 2017-07-05 NOTE — Assessment & Plan Note (Signed)
I reviewed patient's last urinary drug screen with her.  Benzodiazepines were present but the Norco was not, this was unexpected given her reports of consistent use.  It is possible that hemodialysis may explain.  However, I would suspect that had she taken the medication that morning of the appointment that it would be present in her system, especially given that the Klonopin was present.  For this reason, I explained to her that I would have to refer her to pain management for further care.  A copy of her UDS was provided to her today.  I refilled her Norco times 1 month while we await for her to establish care with a pain provider.  I reviewed with her and provided her written instructions denoting that this would be the last refill of this controlled substance from this office and that she should be mindful of her use of Norco while she awaits her appointment.

## 2017-07-05 NOTE — Assessment & Plan Note (Signed)
Clinically consistent with a lipoma.  Lesion was well rounded, mobile.  I offered referral for excision of lesion.  Patient declined this at this time as it is not bothering her.

## 2017-07-05 NOTE — Assessment & Plan Note (Signed)
Recent autoimmune workup was negative.  CRP was slightly elevated but she also had a URI at the time of measurement.  ANA, RF and ESR were all within normal limits.

## 2017-07-05 NOTE — Patient Instructions (Signed)
As we discussed during today's visit, your urine drug screen did not show hydrocodone your system.  Given that you are taking this every day, this was unexpected.  For this reason, you have been referred to pain management for further management of your pain medications.  You have been given a one-time, one-month refill of the hydrocodone while you wait to establish with a pain management.  Please be cognizant of your use of this medication while you are waiting to be established with pain management, as no further refills will be provided from this office.

## 2017-07-05 NOTE — Assessment & Plan Note (Signed)
Not controlled during today's evaluation.  She has not taken her medication this morning.  I recommended that she take this as soon as she gets home.  She is currently a symptomatic.

## 2017-07-06 ENCOUNTER — Other Ambulatory Visit (HOSPITAL_COMMUNITY): Payer: Self-pay | Admitting: Psychiatry

## 2017-07-06 DIAGNOSIS — N186 End stage renal disease: Secondary | ICD-10-CM | POA: Diagnosis not present

## 2017-07-06 DIAGNOSIS — Z992 Dependence on renal dialysis: Secondary | ICD-10-CM | POA: Diagnosis not present

## 2017-07-06 DIAGNOSIS — N2581 Secondary hyperparathyroidism of renal origin: Secondary | ICD-10-CM | POA: Diagnosis not present

## 2017-07-06 DIAGNOSIS — D631 Anemia in chronic kidney disease: Secondary | ICD-10-CM | POA: Diagnosis not present

## 2017-07-08 DIAGNOSIS — D631 Anemia in chronic kidney disease: Secondary | ICD-10-CM | POA: Diagnosis not present

## 2017-07-08 DIAGNOSIS — N2581 Secondary hyperparathyroidism of renal origin: Secondary | ICD-10-CM | POA: Diagnosis not present

## 2017-07-08 DIAGNOSIS — Z992 Dependence on renal dialysis: Secondary | ICD-10-CM | POA: Diagnosis not present

## 2017-07-08 DIAGNOSIS — N186 End stage renal disease: Secondary | ICD-10-CM | POA: Diagnosis not present

## 2017-07-10 LAB — TOXASSURE SELECT 13 (MW), URINE

## 2017-07-11 DIAGNOSIS — N186 End stage renal disease: Secondary | ICD-10-CM | POA: Diagnosis not present

## 2017-07-11 DIAGNOSIS — D509 Iron deficiency anemia, unspecified: Secondary | ICD-10-CM | POA: Diagnosis not present

## 2017-07-11 DIAGNOSIS — D631 Anemia in chronic kidney disease: Secondary | ICD-10-CM | POA: Diagnosis not present

## 2017-07-11 DIAGNOSIS — Z992 Dependence on renal dialysis: Secondary | ICD-10-CM | POA: Diagnosis not present

## 2017-07-11 DIAGNOSIS — M25561 Pain in right knee: Secondary | ICD-10-CM | POA: Diagnosis not present

## 2017-07-11 DIAGNOSIS — S82031D Displaced transverse fracture of right patella, subsequent encounter for closed fracture with routine healing: Secondary | ICD-10-CM | POA: Diagnosis not present

## 2017-07-12 DIAGNOSIS — M25561 Pain in right knee: Secondary | ICD-10-CM | POA: Diagnosis not present

## 2017-07-12 DIAGNOSIS — S82031D Displaced transverse fracture of right patella, subsequent encounter for closed fracture with routine healing: Secondary | ICD-10-CM | POA: Diagnosis not present

## 2017-07-13 DIAGNOSIS — D509 Iron deficiency anemia, unspecified: Secondary | ICD-10-CM | POA: Diagnosis not present

## 2017-07-13 DIAGNOSIS — N186 End stage renal disease: Secondary | ICD-10-CM | POA: Diagnosis not present

## 2017-07-13 DIAGNOSIS — D631 Anemia in chronic kidney disease: Secondary | ICD-10-CM | POA: Diagnosis not present

## 2017-07-13 DIAGNOSIS — Z992 Dependence on renal dialysis: Secondary | ICD-10-CM | POA: Diagnosis not present

## 2017-07-16 DIAGNOSIS — N186 End stage renal disease: Secondary | ICD-10-CM | POA: Diagnosis not present

## 2017-07-16 DIAGNOSIS — D509 Iron deficiency anemia, unspecified: Secondary | ICD-10-CM | POA: Diagnosis not present

## 2017-07-16 DIAGNOSIS — Z992 Dependence on renal dialysis: Secondary | ICD-10-CM | POA: Diagnosis not present

## 2017-07-16 DIAGNOSIS — D631 Anemia in chronic kidney disease: Secondary | ICD-10-CM | POA: Diagnosis not present

## 2017-07-17 DIAGNOSIS — S82031D Displaced transverse fracture of right patella, subsequent encounter for closed fracture with routine healing: Secondary | ICD-10-CM | POA: Diagnosis not present

## 2017-07-18 DIAGNOSIS — D509 Iron deficiency anemia, unspecified: Secondary | ICD-10-CM | POA: Diagnosis not present

## 2017-07-18 DIAGNOSIS — Z992 Dependence on renal dialysis: Secondary | ICD-10-CM | POA: Diagnosis not present

## 2017-07-18 DIAGNOSIS — N186 End stage renal disease: Secondary | ICD-10-CM | POA: Diagnosis not present

## 2017-07-18 DIAGNOSIS — D631 Anemia in chronic kidney disease: Secondary | ICD-10-CM | POA: Diagnosis not present

## 2017-07-19 DIAGNOSIS — S82031D Displaced transverse fracture of right patella, subsequent encounter for closed fracture with routine healing: Secondary | ICD-10-CM | POA: Diagnosis not present

## 2017-07-19 DIAGNOSIS — M25561 Pain in right knee: Secondary | ICD-10-CM | POA: Diagnosis not present

## 2017-07-20 DIAGNOSIS — Z992 Dependence on renal dialysis: Secondary | ICD-10-CM | POA: Diagnosis not present

## 2017-07-20 DIAGNOSIS — D631 Anemia in chronic kidney disease: Secondary | ICD-10-CM | POA: Diagnosis not present

## 2017-07-20 DIAGNOSIS — N186 End stage renal disease: Secondary | ICD-10-CM | POA: Diagnosis not present

## 2017-07-20 DIAGNOSIS — D509 Iron deficiency anemia, unspecified: Secondary | ICD-10-CM | POA: Diagnosis not present

## 2017-07-23 DIAGNOSIS — Z992 Dependence on renal dialysis: Secondary | ICD-10-CM | POA: Diagnosis not present

## 2017-07-23 DIAGNOSIS — D631 Anemia in chronic kidney disease: Secondary | ICD-10-CM | POA: Diagnosis not present

## 2017-07-23 DIAGNOSIS — N186 End stage renal disease: Secondary | ICD-10-CM | POA: Diagnosis not present

## 2017-07-23 DIAGNOSIS — D509 Iron deficiency anemia, unspecified: Secondary | ICD-10-CM | POA: Diagnosis not present

## 2017-07-24 DIAGNOSIS — S82031D Displaced transverse fracture of right patella, subsequent encounter for closed fracture with routine healing: Secondary | ICD-10-CM | POA: Diagnosis not present

## 2017-07-24 DIAGNOSIS — M25561 Pain in right knee: Secondary | ICD-10-CM | POA: Diagnosis not present

## 2017-07-25 DIAGNOSIS — D509 Iron deficiency anemia, unspecified: Secondary | ICD-10-CM | POA: Diagnosis not present

## 2017-07-25 DIAGNOSIS — D631 Anemia in chronic kidney disease: Secondary | ICD-10-CM | POA: Diagnosis not present

## 2017-07-25 DIAGNOSIS — Z992 Dependence on renal dialysis: Secondary | ICD-10-CM | POA: Diagnosis not present

## 2017-07-25 DIAGNOSIS — N186 End stage renal disease: Secondary | ICD-10-CM | POA: Diagnosis not present

## 2017-07-26 DIAGNOSIS — M25561 Pain in right knee: Secondary | ICD-10-CM | POA: Diagnosis not present

## 2017-07-26 DIAGNOSIS — S82031D Displaced transverse fracture of right patella, subsequent encounter for closed fracture with routine healing: Secondary | ICD-10-CM | POA: Diagnosis not present

## 2017-07-27 DIAGNOSIS — N186 End stage renal disease: Secondary | ICD-10-CM | POA: Diagnosis not present

## 2017-07-27 DIAGNOSIS — D509 Iron deficiency anemia, unspecified: Secondary | ICD-10-CM | POA: Diagnosis not present

## 2017-07-27 DIAGNOSIS — Z992 Dependence on renal dialysis: Secondary | ICD-10-CM | POA: Diagnosis not present

## 2017-07-27 DIAGNOSIS — D631 Anemia in chronic kidney disease: Secondary | ICD-10-CM | POA: Diagnosis not present

## 2017-07-30 DIAGNOSIS — Z992 Dependence on renal dialysis: Secondary | ICD-10-CM | POA: Diagnosis not present

## 2017-07-30 DIAGNOSIS — N186 End stage renal disease: Secondary | ICD-10-CM | POA: Diagnosis not present

## 2017-07-30 DIAGNOSIS — D509 Iron deficiency anemia, unspecified: Secondary | ICD-10-CM | POA: Diagnosis not present

## 2017-07-30 DIAGNOSIS — D631 Anemia in chronic kidney disease: Secondary | ICD-10-CM | POA: Diagnosis not present

## 2017-07-31 ENCOUNTER — Ambulatory Visit: Payer: Medicare Other

## 2017-07-31 ENCOUNTER — Ambulatory Visit: Payer: Self-pay

## 2017-08-01 ENCOUNTER — Telehealth: Payer: Self-pay

## 2017-08-01 DIAGNOSIS — D631 Anemia in chronic kidney disease: Secondary | ICD-10-CM | POA: Diagnosis not present

## 2017-08-01 DIAGNOSIS — N186 End stage renal disease: Secondary | ICD-10-CM | POA: Diagnosis not present

## 2017-08-01 DIAGNOSIS — D509 Iron deficiency anemia, unspecified: Secondary | ICD-10-CM | POA: Diagnosis not present

## 2017-08-01 DIAGNOSIS — Z992 Dependence on renal dialysis: Secondary | ICD-10-CM | POA: Diagnosis not present

## 2017-08-01 NOTE — Telephone Encounter (Signed)
That said she was wellness nurse stole 30 of my pills

## 2017-08-02 DIAGNOSIS — S82031D Displaced transverse fracture of right patella, subsequent encounter for closed fracture with routine healing: Secondary | ICD-10-CM | POA: Diagnosis not present

## 2017-08-02 DIAGNOSIS — M25561 Pain in right knee: Secondary | ICD-10-CM | POA: Diagnosis not present

## 2017-08-02 NOTE — Telephone Encounter (Signed)
Patient aware and verbalizes understanding. 

## 2017-08-02 NOTE — Telephone Encounter (Signed)
That is very unfortunate.  I released patient's opioid prescriptions to pain management.  She was referred, as her urine was negative for prescribed controlled substance.  Additionally, per the [facility] pain contract which she signed in 05/2017, lost/ stolen medication is not replaceable under any circumstances.  Please have her keep her pain management appt.

## 2017-08-03 DIAGNOSIS — N186 End stage renal disease: Secondary | ICD-10-CM | POA: Diagnosis not present

## 2017-08-03 DIAGNOSIS — Z992 Dependence on renal dialysis: Secondary | ICD-10-CM | POA: Diagnosis not present

## 2017-08-03 DIAGNOSIS — D631 Anemia in chronic kidney disease: Secondary | ICD-10-CM | POA: Diagnosis not present

## 2017-08-03 DIAGNOSIS — D509 Iron deficiency anemia, unspecified: Secondary | ICD-10-CM | POA: Diagnosis not present

## 2017-08-05 ENCOUNTER — Ambulatory Visit (INDEPENDENT_AMBULATORY_CARE_PROVIDER_SITE_OTHER): Payer: Medicare Other | Admitting: Family Medicine

## 2017-08-05 ENCOUNTER — Encounter: Payer: Self-pay | Admitting: Family Medicine

## 2017-08-05 VITALS — BP 163/58 | HR 78 | Temp 99.1°F | Ht <= 58 in | Wt 178.0 lb

## 2017-08-05 DIAGNOSIS — G894 Chronic pain syndrome: Secondary | ICD-10-CM | POA: Diagnosis not present

## 2017-08-05 MED ORDER — HYDROCODONE-ACETAMINOPHEN 5-325 MG PO TABS: 1.0000 | ORAL_TABLET | Freq: Two times a day (BID) | ORAL | 0 refills | Status: AC | PRN

## 2017-08-05 NOTE — Patient Instructions (Signed)
As we discussed, your prescription must last into your appointment with pain management.  No further refills were provided by this office.  Narcotic Guidelines:  1. You cannot get an early refill, even it is lost.  2. You cannot get pain medications from any other doctor, unless it is the emergency department and related to a new problem or injury.  3. You cannot use alcohol, marijuana, cocaine or any other recreational drugs while using this medication. This is very dangerous.  4. You are willing to have your urine drug tested at each visit.  5. You will not drive while using this medication, because that can put yourself and others in serious danger of an accident. 6. If any medication is stolen, then there must be a police report to verify it, or it cannot be refilled.  7. I will not prescribe these medications for longer than 3 months.  8. You must bring your pill bottle to each visit.  9. You must use the same pharmacy for all refills for the medication, unless you clear it with me beforehand.  10. You cannot share or sell this medication.  11. Always take a stool softener to prevent constipation.

## 2017-08-05 NOTE — Assessment & Plan Note (Signed)
Patient provided a 45-day taper of Norco.  Her urine drug screen continues to be negative for hydrocodone.  I suspect that this is likely related to hemodialysis.  However, this continues to be outside the scope of my practice and therefore she was referred to pain management last visit.  She has an appointment with pain management on October 28. The Narcotic Database has been reviewed.  There were no red flags.
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
## 2017-08-06 DIAGNOSIS — N186 End stage renal disease: Secondary | ICD-10-CM | POA: Diagnosis not present

## 2017-08-06 DIAGNOSIS — D509 Iron deficiency anemia, unspecified: Secondary | ICD-10-CM | POA: Diagnosis not present

## 2017-08-06 DIAGNOSIS — Z992 Dependence on renal dialysis: Secondary | ICD-10-CM | POA: Diagnosis not present

## 2017-08-06 DIAGNOSIS — D631 Anemia in chronic kidney disease: Secondary | ICD-10-CM | POA: Diagnosis not present

## 2017-08-07 ENCOUNTER — Encounter (HOSPITAL_COMMUNITY): Payer: Self-pay | Admitting: Psychiatry

## 2017-08-07 ENCOUNTER — Ambulatory Visit (INDEPENDENT_AMBULATORY_CARE_PROVIDER_SITE_OTHER): Payer: Medicare Other | Admitting: Psychiatry

## 2017-08-07 DIAGNOSIS — Z79899 Other long term (current) drug therapy: Secondary | ICD-10-CM | POA: Diagnosis not present

## 2017-08-07 DIAGNOSIS — Z87442 Personal history of urinary calculi: Secondary | ICD-10-CM | POA: Diagnosis not present

## 2017-08-07 DIAGNOSIS — F331 Major depressive disorder, recurrent, moderate: Secondary | ICD-10-CM

## 2017-08-07 DIAGNOSIS — Z811 Family history of alcohol abuse and dependence: Secondary | ICD-10-CM | POA: Diagnosis not present

## 2017-08-07 DIAGNOSIS — Z992 Dependence on renal dialysis: Secondary | ICD-10-CM

## 2017-08-07 DIAGNOSIS — M549 Dorsalgia, unspecified: Secondary | ICD-10-CM

## 2017-08-07 DIAGNOSIS — F17210 Nicotine dependence, cigarettes, uncomplicated: Secondary | ICD-10-CM

## 2017-08-07 DIAGNOSIS — Z813 Family history of other psychoactive substance abuse and dependence: Secondary | ICD-10-CM

## 2017-08-07 DIAGNOSIS — N19 Unspecified kidney failure: Secondary | ICD-10-CM | POA: Diagnosis not present

## 2017-08-07 DIAGNOSIS — F419 Anxiety disorder, unspecified: Secondary | ICD-10-CM

## 2017-08-07 DIAGNOSIS — Z818 Family history of other mental and behavioral disorders: Secondary | ICD-10-CM

## 2017-08-07 DIAGNOSIS — M2550 Pain in unspecified joint: Secondary | ICD-10-CM

## 2017-08-07 MED ORDER — SERTRALINE HCL 100 MG PO TABS: 100.0000 mg | ORAL_TABLET | Freq: Every day | ORAL | 3 refills | Status: DC

## 2017-08-07 MED ORDER — CLONAZEPAM 0.5 MG PO TABS: 0.5000 mg | ORAL_TABLET | Freq: Four times a day (QID) | ORAL | 2 refills | Status: DC
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
## 2017-08-08 DIAGNOSIS — D509 Iron deficiency anemia, unspecified: Secondary | ICD-10-CM | POA: Diagnosis not present

## 2017-08-08 DIAGNOSIS — N186 End stage renal disease: Secondary | ICD-10-CM | POA: Diagnosis not present

## 2017-08-08 DIAGNOSIS — D631 Anemia in chronic kidney disease: Secondary | ICD-10-CM | POA: Diagnosis not present

## 2017-08-08 DIAGNOSIS — Z992 Dependence on renal dialysis: Secondary | ICD-10-CM | POA: Diagnosis not present

## 2017-08-09 ENCOUNTER — Other Ambulatory Visit: Payer: Self-pay | Admitting: Cardiology

## 2017-08-09 ENCOUNTER — Other Ambulatory Visit: Payer: Self-pay | Admitting: Family Medicine

## 2017-08-10 DIAGNOSIS — D631 Anemia in chronic kidney disease: Secondary | ICD-10-CM | POA: Diagnosis not present

## 2017-08-10 DIAGNOSIS — N186 End stage renal disease: Secondary | ICD-10-CM | POA: Diagnosis not present

## 2017-08-10 DIAGNOSIS — Z992 Dependence on renal dialysis: Secondary | ICD-10-CM | POA: Diagnosis not present

## 2017-08-10 DIAGNOSIS — D509 Iron deficiency anemia, unspecified: Secondary | ICD-10-CM | POA: Diagnosis not present

## 2017-08-13 DIAGNOSIS — D631 Anemia in chronic kidney disease: Secondary | ICD-10-CM | POA: Diagnosis not present

## 2017-08-13 DIAGNOSIS — D509 Iron deficiency anemia, unspecified: Secondary | ICD-10-CM | POA: Diagnosis not present

## 2017-08-13 DIAGNOSIS — Z992 Dependence on renal dialysis: Secondary | ICD-10-CM | POA: Diagnosis not present

## 2017-08-13 DIAGNOSIS — N186 End stage renal disease: Secondary | ICD-10-CM | POA: Diagnosis not present

## 2017-08-15 DIAGNOSIS — N186 End stage renal disease: Secondary | ICD-10-CM | POA: Diagnosis not present

## 2017-08-15 DIAGNOSIS — Z992 Dependence on renal dialysis: Secondary | ICD-10-CM | POA: Diagnosis not present

## 2017-08-15 DIAGNOSIS — D509 Iron deficiency anemia, unspecified: Secondary | ICD-10-CM | POA: Diagnosis not present

## 2017-08-15 DIAGNOSIS — D631 Anemia in chronic kidney disease: Secondary | ICD-10-CM | POA: Diagnosis not present

## 2017-08-16 DIAGNOSIS — S82031D Displaced transverse fracture of right patella, subsequent encounter for closed fracture with routine healing: Secondary | ICD-10-CM | POA: Diagnosis not present

## 2017-08-16 DIAGNOSIS — R262 Difficulty in walking, not elsewhere classified: Secondary | ICD-10-CM | POA: Diagnosis not present

## 2017-08-16 DIAGNOSIS — M25561 Pain in right knee: Secondary | ICD-10-CM | POA: Diagnosis not present

## 2017-08-17 DIAGNOSIS — D509 Iron deficiency anemia, unspecified: Secondary | ICD-10-CM | POA: Diagnosis not present

## 2017-08-17 DIAGNOSIS — Z992 Dependence on renal dialysis: Secondary | ICD-10-CM | POA: Diagnosis not present

## 2017-08-17 DIAGNOSIS — N186 End stage renal disease: Secondary | ICD-10-CM | POA: Diagnosis not present

## 2017-08-17 DIAGNOSIS — D631 Anemia in chronic kidney disease: Secondary | ICD-10-CM | POA: Diagnosis not present

## 2017-08-20 DIAGNOSIS — D509 Iron deficiency anemia, unspecified: Secondary | ICD-10-CM | POA: Diagnosis not present

## 2017-08-20 DIAGNOSIS — D631 Anemia in chronic kidney disease: Secondary | ICD-10-CM | POA: Diagnosis not present

## 2017-08-20 DIAGNOSIS — Z992 Dependence on renal dialysis: Secondary | ICD-10-CM | POA: Diagnosis not present

## 2017-08-20 DIAGNOSIS — N186 End stage renal disease: Secondary | ICD-10-CM | POA: Diagnosis not present

## 2017-08-21 DIAGNOSIS — S82031D Displaced transverse fracture of right patella, subsequent encounter for closed fracture with routine healing: Secondary | ICD-10-CM | POA: Diagnosis not present

## 2017-08-21 DIAGNOSIS — M25561 Pain in right knee: Secondary | ICD-10-CM | POA: Diagnosis not present

## 2017-08-21 DIAGNOSIS — R262 Difficulty in walking, not elsewhere classified: Secondary | ICD-10-CM | POA: Diagnosis not present

## 2017-08-22 DIAGNOSIS — D631 Anemia in chronic kidney disease: Secondary | ICD-10-CM | POA: Diagnosis not present

## 2017-08-22 DIAGNOSIS — D509 Iron deficiency anemia, unspecified: Secondary | ICD-10-CM | POA: Diagnosis not present

## 2017-08-22 DIAGNOSIS — N186 End stage renal disease: Secondary | ICD-10-CM | POA: Diagnosis not present

## 2017-08-22 DIAGNOSIS — Z992 Dependence on renal dialysis: Secondary | ICD-10-CM | POA: Diagnosis not present

## 2017-08-23 DIAGNOSIS — R262 Difficulty in walking, not elsewhere classified: Secondary | ICD-10-CM | POA: Diagnosis not present

## 2017-08-23 DIAGNOSIS — S82031D Displaced transverse fracture of right patella, subsequent encounter for closed fracture with routine healing: Secondary | ICD-10-CM | POA: Diagnosis not present

## 2017-08-23 DIAGNOSIS — M25561 Pain in right knee: Secondary | ICD-10-CM | POA: Diagnosis not present

## 2017-08-24 DIAGNOSIS — D509 Iron deficiency anemia, unspecified: Secondary | ICD-10-CM | POA: Diagnosis not present

## 2017-08-24 DIAGNOSIS — D631 Anemia in chronic kidney disease: Secondary | ICD-10-CM | POA: Diagnosis not present

## 2017-08-24 DIAGNOSIS — Z992 Dependence on renal dialysis: Secondary | ICD-10-CM | POA: Diagnosis not present

## 2017-08-24 DIAGNOSIS — N186 End stage renal disease: Secondary | ICD-10-CM | POA: Diagnosis not present

## 2017-08-27 DIAGNOSIS — D509 Iron deficiency anemia, unspecified: Secondary | ICD-10-CM | POA: Diagnosis not present

## 2017-08-27 DIAGNOSIS — N186 End stage renal disease: Secondary | ICD-10-CM | POA: Diagnosis not present

## 2017-08-27 DIAGNOSIS — Z992 Dependence on renal dialysis: Secondary | ICD-10-CM | POA: Diagnosis not present

## 2017-08-27 DIAGNOSIS — D631 Anemia in chronic kidney disease: Secondary | ICD-10-CM | POA: Diagnosis not present

## 2017-08-28 ENCOUNTER — Other Ambulatory Visit: Payer: Medicare Other

## 2017-08-28 DIAGNOSIS — S82031D Displaced transverse fracture of right patella, subsequent encounter for closed fracture with routine healing: Secondary | ICD-10-CM | POA: Diagnosis not present

## 2017-08-29 DIAGNOSIS — D631 Anemia in chronic kidney disease: Secondary | ICD-10-CM | POA: Diagnosis not present

## 2017-08-29 DIAGNOSIS — N186 End stage renal disease: Secondary | ICD-10-CM | POA: Diagnosis not present

## 2017-08-29 DIAGNOSIS — Z992 Dependence on renal dialysis: Secondary | ICD-10-CM | POA: Diagnosis not present

## 2017-08-29 DIAGNOSIS — D509 Iron deficiency anemia, unspecified: Secondary | ICD-10-CM | POA: Diagnosis not present

## 2017-08-30 DIAGNOSIS — R262 Difficulty in walking, not elsewhere classified: Secondary | ICD-10-CM | POA: Diagnosis not present

## 2017-08-30 DIAGNOSIS — S82031D Displaced transverse fracture of right patella, subsequent encounter for closed fracture with routine healing: Secondary | ICD-10-CM | POA: Diagnosis not present

## 2017-08-30 DIAGNOSIS — M25561 Pain in right knee: Secondary | ICD-10-CM | POA: Diagnosis not present

## 2017-08-31 DIAGNOSIS — D631 Anemia in chronic kidney disease: Secondary | ICD-10-CM | POA: Diagnosis not present

## 2017-08-31 DIAGNOSIS — N186 End stage renal disease: Secondary | ICD-10-CM | POA: Diagnosis not present

## 2017-08-31 DIAGNOSIS — Z992 Dependence on renal dialysis: Secondary | ICD-10-CM | POA: Diagnosis not present

## 2017-08-31 DIAGNOSIS — D509 Iron deficiency anemia, unspecified: Secondary | ICD-10-CM | POA: Diagnosis not present

## 2017-09-03 DIAGNOSIS — Z992 Dependence on renal dialysis: Secondary | ICD-10-CM | POA: Diagnosis not present

## 2017-09-03 DIAGNOSIS — N186 End stage renal disease: Secondary | ICD-10-CM | POA: Diagnosis not present

## 2017-09-03 DIAGNOSIS — D631 Anemia in chronic kidney disease: Secondary | ICD-10-CM | POA: Diagnosis not present

## 2017-09-03 DIAGNOSIS — D509 Iron deficiency anemia, unspecified: Secondary | ICD-10-CM | POA: Diagnosis not present

## 2017-09-04 ENCOUNTER — Other Ambulatory Visit: Payer: Medicare Other

## 2017-09-05 ENCOUNTER — Other Ambulatory Visit: Payer: Self-pay | Admitting: Cardiology

## 2017-09-05 DIAGNOSIS — S82031D Displaced transverse fracture of right patella, subsequent encounter for closed fracture with routine healing: Secondary | ICD-10-CM | POA: Diagnosis not present

## 2017-09-05 DIAGNOSIS — M25561 Pain in right knee: Secondary | ICD-10-CM | POA: Diagnosis not present

## 2017-09-05 DIAGNOSIS — D509 Iron deficiency anemia, unspecified: Secondary | ICD-10-CM | POA: Diagnosis not present

## 2017-09-05 DIAGNOSIS — R262 Difficulty in walking, not elsewhere classified: Secondary | ICD-10-CM | POA: Diagnosis not present

## 2017-09-05 DIAGNOSIS — D631 Anemia in chronic kidney disease: Secondary | ICD-10-CM | POA: Diagnosis not present

## 2017-09-05 DIAGNOSIS — Z992 Dependence on renal dialysis: Secondary | ICD-10-CM | POA: Diagnosis not present

## 2017-09-05 DIAGNOSIS — N186 End stage renal disease: Secondary | ICD-10-CM | POA: Diagnosis not present

## 2017-09-07 DIAGNOSIS — N186 End stage renal disease: Secondary | ICD-10-CM | POA: Diagnosis not present

## 2017-09-07 DIAGNOSIS — Z992 Dependence on renal dialysis: Secondary | ICD-10-CM | POA: Diagnosis not present

## 2017-09-07 DIAGNOSIS — N2581 Secondary hyperparathyroidism of renal origin: Secondary | ICD-10-CM | POA: Diagnosis not present

## 2017-09-07 DIAGNOSIS — D631 Anemia in chronic kidney disease: Secondary | ICD-10-CM | POA: Diagnosis not present

## 2017-09-07 DIAGNOSIS — D509 Iron deficiency anemia, unspecified: Secondary | ICD-10-CM | POA: Diagnosis not present

## 2017-09-10 DIAGNOSIS — D631 Anemia in chronic kidney disease: Secondary | ICD-10-CM | POA: Diagnosis not present

## 2017-09-10 DIAGNOSIS — D509 Iron deficiency anemia, unspecified: Secondary | ICD-10-CM | POA: Diagnosis not present

## 2017-09-10 DIAGNOSIS — N2581 Secondary hyperparathyroidism of renal origin: Secondary | ICD-10-CM | POA: Diagnosis not present

## 2017-09-10 DIAGNOSIS — N186 End stage renal disease: Secondary | ICD-10-CM | POA: Diagnosis not present

## 2017-09-10 DIAGNOSIS — Z992 Dependence on renal dialysis: Secondary | ICD-10-CM | POA: Diagnosis not present

## 2017-09-12 DIAGNOSIS — N2581 Secondary hyperparathyroidism of renal origin: Secondary | ICD-10-CM | POA: Diagnosis not present

## 2017-09-12 DIAGNOSIS — N186 End stage renal disease: Secondary | ICD-10-CM | POA: Diagnosis not present

## 2017-09-12 DIAGNOSIS — Z992 Dependence on renal dialysis: Secondary | ICD-10-CM | POA: Diagnosis not present

## 2017-09-12 DIAGNOSIS — D509 Iron deficiency anemia, unspecified: Secondary | ICD-10-CM | POA: Diagnosis not present

## 2017-09-12 DIAGNOSIS — D631 Anemia in chronic kidney disease: Secondary | ICD-10-CM | POA: Diagnosis not present

## 2017-09-14 DIAGNOSIS — D631 Anemia in chronic kidney disease: Secondary | ICD-10-CM | POA: Diagnosis not present

## 2017-09-14 DIAGNOSIS — D509 Iron deficiency anemia, unspecified: Secondary | ICD-10-CM | POA: Diagnosis not present

## 2017-09-14 DIAGNOSIS — N186 End stage renal disease: Secondary | ICD-10-CM | POA: Diagnosis not present

## 2017-09-14 DIAGNOSIS — Z992 Dependence on renal dialysis: Secondary | ICD-10-CM | POA: Diagnosis not present

## 2017-09-14 DIAGNOSIS — N2581 Secondary hyperparathyroidism of renal origin: Secondary | ICD-10-CM | POA: Diagnosis not present

## 2017-09-17 DIAGNOSIS — N2581 Secondary hyperparathyroidism of renal origin: Secondary | ICD-10-CM | POA: Diagnosis not present

## 2017-09-17 DIAGNOSIS — Z992 Dependence on renal dialysis: Secondary | ICD-10-CM | POA: Diagnosis not present

## 2017-09-17 DIAGNOSIS — N186 End stage renal disease: Secondary | ICD-10-CM | POA: Diagnosis not present

## 2017-09-17 DIAGNOSIS — D631 Anemia in chronic kidney disease: Secondary | ICD-10-CM | POA: Diagnosis not present

## 2017-09-17 DIAGNOSIS — D509 Iron deficiency anemia, unspecified: Secondary | ICD-10-CM | POA: Diagnosis not present

## 2017-09-19 DIAGNOSIS — D509 Iron deficiency anemia, unspecified: Secondary | ICD-10-CM | POA: Diagnosis not present

## 2017-09-19 DIAGNOSIS — N186 End stage renal disease: Secondary | ICD-10-CM | POA: Diagnosis not present

## 2017-09-19 DIAGNOSIS — Z992 Dependence on renal dialysis: Secondary | ICD-10-CM | POA: Diagnosis not present

## 2017-09-19 DIAGNOSIS — D631 Anemia in chronic kidney disease: Secondary | ICD-10-CM | POA: Diagnosis not present

## 2017-09-19 DIAGNOSIS — N2581 Secondary hyperparathyroidism of renal origin: Secondary | ICD-10-CM | POA: Diagnosis not present

## 2017-09-21 DIAGNOSIS — D631 Anemia in chronic kidney disease: Secondary | ICD-10-CM | POA: Diagnosis not present

## 2017-09-21 DIAGNOSIS — N186 End stage renal disease: Secondary | ICD-10-CM | POA: Diagnosis not present

## 2017-09-21 DIAGNOSIS — Z992 Dependence on renal dialysis: Secondary | ICD-10-CM | POA: Diagnosis not present

## 2017-09-21 DIAGNOSIS — N2581 Secondary hyperparathyroidism of renal origin: Secondary | ICD-10-CM | POA: Diagnosis not present

## 2017-09-21 DIAGNOSIS — D509 Iron deficiency anemia, unspecified: Secondary | ICD-10-CM | POA: Diagnosis not present

## 2017-09-24 DIAGNOSIS — N2581 Secondary hyperparathyroidism of renal origin: Secondary | ICD-10-CM | POA: Diagnosis not present

## 2017-09-24 DIAGNOSIS — N186 End stage renal disease: Secondary | ICD-10-CM | POA: Diagnosis not present

## 2017-09-24 DIAGNOSIS — D631 Anemia in chronic kidney disease: Secondary | ICD-10-CM | POA: Diagnosis not present

## 2017-09-24 DIAGNOSIS — D509 Iron deficiency anemia, unspecified: Secondary | ICD-10-CM | POA: Diagnosis not present

## 2017-09-24 DIAGNOSIS — Z992 Dependence on renal dialysis: Secondary | ICD-10-CM | POA: Diagnosis not present

## 2017-09-25 DIAGNOSIS — Z79891 Long term (current) use of opiate analgesic: Secondary | ICD-10-CM | POA: Diagnosis not present

## 2017-09-25 DIAGNOSIS — Z79899 Other long term (current) drug therapy: Secondary | ICD-10-CM | POA: Diagnosis not present

## 2017-09-25 DIAGNOSIS — M25569 Pain in unspecified knee: Secondary | ICD-10-CM | POA: Diagnosis not present

## 2017-09-25 DIAGNOSIS — M545 Low back pain: Secondary | ICD-10-CM | POA: Diagnosis not present

## 2017-09-25 DIAGNOSIS — M5416 Radiculopathy, lumbar region: Secondary | ICD-10-CM | POA: Diagnosis not present

## 2017-09-25 DIAGNOSIS — M130 Polyarthritis, unspecified: Secondary | ICD-10-CM | POA: Diagnosis not present

## 2017-09-25 DIAGNOSIS — F419 Anxiety disorder, unspecified: Secondary | ICD-10-CM | POA: Diagnosis not present

## 2017-09-26 DIAGNOSIS — Z992 Dependence on renal dialysis: Secondary | ICD-10-CM | POA: Diagnosis not present

## 2017-09-26 DIAGNOSIS — N186 End stage renal disease: Secondary | ICD-10-CM | POA: Diagnosis not present

## 2017-09-26 DIAGNOSIS — D631 Anemia in chronic kidney disease: Secondary | ICD-10-CM | POA: Diagnosis not present

## 2017-09-26 DIAGNOSIS — N2581 Secondary hyperparathyroidism of renal origin: Secondary | ICD-10-CM | POA: Diagnosis not present

## 2017-09-26 DIAGNOSIS — D509 Iron deficiency anemia, unspecified: Secondary | ICD-10-CM | POA: Diagnosis not present

## 2017-09-28 DIAGNOSIS — D509 Iron deficiency anemia, unspecified: Secondary | ICD-10-CM | POA: Diagnosis not present

## 2017-09-28 DIAGNOSIS — N2581 Secondary hyperparathyroidism of renal origin: Secondary | ICD-10-CM | POA: Diagnosis not present

## 2017-09-28 DIAGNOSIS — N186 End stage renal disease: Secondary | ICD-10-CM | POA: Diagnosis not present

## 2017-09-28 DIAGNOSIS — D631 Anemia in chronic kidney disease: Secondary | ICD-10-CM | POA: Diagnosis not present

## 2017-09-28 DIAGNOSIS — Z992 Dependence on renal dialysis: Secondary | ICD-10-CM | POA: Diagnosis not present

## 2017-10-01 DIAGNOSIS — N186 End stage renal disease: Secondary | ICD-10-CM | POA: Diagnosis not present

## 2017-10-01 DIAGNOSIS — D631 Anemia in chronic kidney disease: Secondary | ICD-10-CM | POA: Diagnosis not present

## 2017-10-01 DIAGNOSIS — N2581 Secondary hyperparathyroidism of renal origin: Secondary | ICD-10-CM | POA: Diagnosis not present

## 2017-10-01 DIAGNOSIS — Z992 Dependence on renal dialysis: Secondary | ICD-10-CM | POA: Diagnosis not present

## 2017-10-01 DIAGNOSIS — D509 Iron deficiency anemia, unspecified: Secondary | ICD-10-CM | POA: Diagnosis not present

## 2017-10-02 ENCOUNTER — Ambulatory Visit (INDEPENDENT_AMBULATORY_CARE_PROVIDER_SITE_OTHER): Payer: Medicare Other

## 2017-10-02 ENCOUNTER — Other Ambulatory Visit: Payer: Self-pay

## 2017-10-02 DIAGNOSIS — I6523 Occlusion and stenosis of bilateral carotid arteries: Secondary | ICD-10-CM

## 2017-10-02 DIAGNOSIS — R011 Cardiac murmur, unspecified: Secondary | ICD-10-CM | POA: Diagnosis not present

## 2017-10-03 DIAGNOSIS — N2581 Secondary hyperparathyroidism of renal origin: Secondary | ICD-10-CM | POA: Diagnosis not present

## 2017-10-03 DIAGNOSIS — D631 Anemia in chronic kidney disease: Secondary | ICD-10-CM | POA: Diagnosis not present

## 2017-10-03 DIAGNOSIS — N186 End stage renal disease: Secondary | ICD-10-CM | POA: Diagnosis not present

## 2017-10-03 DIAGNOSIS — D509 Iron deficiency anemia, unspecified: Secondary | ICD-10-CM | POA: Diagnosis not present

## 2017-10-03 DIAGNOSIS — Z992 Dependence on renal dialysis: Secondary | ICD-10-CM | POA: Diagnosis not present

## 2017-10-05 DIAGNOSIS — D509 Iron deficiency anemia, unspecified: Secondary | ICD-10-CM | POA: Diagnosis not present

## 2017-10-05 DIAGNOSIS — N2581 Secondary hyperparathyroidism of renal origin: Secondary | ICD-10-CM | POA: Diagnosis not present

## 2017-10-05 DIAGNOSIS — N186 End stage renal disease: Secondary | ICD-10-CM | POA: Diagnosis not present

## 2017-10-05 DIAGNOSIS — Z992 Dependence on renal dialysis: Secondary | ICD-10-CM | POA: Diagnosis not present

## 2017-10-05 DIAGNOSIS — D631 Anemia in chronic kidney disease: Secondary | ICD-10-CM | POA: Diagnosis not present

## 2017-10-06 DIAGNOSIS — Z992 Dependence on renal dialysis: Secondary | ICD-10-CM | POA: Diagnosis not present

## 2017-10-06 DIAGNOSIS — N186 End stage renal disease: Secondary | ICD-10-CM | POA: Diagnosis not present

## 2017-10-08 ENCOUNTER — Telehealth: Payer: Self-pay | Admitting: *Deleted

## 2017-10-08 DIAGNOSIS — Z23 Encounter for immunization: Secondary | ICD-10-CM | POA: Diagnosis not present

## 2017-10-08 DIAGNOSIS — D509 Iron deficiency anemia, unspecified: Secondary | ICD-10-CM | POA: Diagnosis not present

## 2017-10-08 DIAGNOSIS — N186 End stage renal disease: Secondary | ICD-10-CM | POA: Diagnosis not present

## 2017-10-08 DIAGNOSIS — Z992 Dependence on renal dialysis: Secondary | ICD-10-CM | POA: Diagnosis not present

## 2017-10-08 DIAGNOSIS — D631 Anemia in chronic kidney disease: Secondary | ICD-10-CM | POA: Diagnosis not present

## 2017-10-08 NOTE — Telephone Encounter (Signed)
Pt aware and voiced understanding - routed to pcp  

## 2017-10-08 NOTE — Telephone Encounter (Signed)
-----   Message from Arnoldo Lenis, MD sent at 10/04/2017  4:00 PM EDT ----- Carotid US shows moderate blockages on both sides, this is just something we will monitor at this time, nothing else required    Zandra Abts MD

## 2017-10-09 ENCOUNTER — Telehealth: Payer: Self-pay | Admitting: *Deleted

## 2017-10-09 NOTE — Telephone Encounter (Signed)
Pt aware and voiced understanding - routed to pcp  

## 2017-10-09 NOTE — Telephone Encounter (Signed)
-----   Message from Arnoldo Lenis, MD sent at 10/04/2017  3:50 PM EDT ----- Echo shows normal heart pumping function. One of her valves is moderately leaky which creates a murmur, this is not something of concern at this time but just something for [place] to monitor over time, likely repeat echo in 2-3 years   J BrancH MD

## 2017-10-10 DIAGNOSIS — Z992 Dependence on renal dialysis: Secondary | ICD-10-CM | POA: Diagnosis not present

## 2017-10-10 DIAGNOSIS — N186 End stage renal disease: Secondary | ICD-10-CM | POA: Diagnosis not present

## 2017-10-10 DIAGNOSIS — D509 Iron deficiency anemia, unspecified: Secondary | ICD-10-CM | POA: Diagnosis not present

## 2017-10-10 DIAGNOSIS — Z23 Encounter for immunization: Secondary | ICD-10-CM | POA: Diagnosis not present

## 2017-10-10 DIAGNOSIS — D631 Anemia in chronic kidney disease: Secondary | ICD-10-CM | POA: Diagnosis not present

## 2017-10-12 DIAGNOSIS — Z992 Dependence on renal dialysis: Secondary | ICD-10-CM | POA: Diagnosis not present

## 2017-10-12 DIAGNOSIS — Z23 Encounter for immunization: Secondary | ICD-10-CM | POA: Diagnosis not present

## 2017-10-12 DIAGNOSIS — D509 Iron deficiency anemia, unspecified: Secondary | ICD-10-CM | POA: Diagnosis not present

## 2017-10-12 DIAGNOSIS — N186 End stage renal disease: Secondary | ICD-10-CM | POA: Diagnosis not present

## 2017-10-12 DIAGNOSIS — D631 Anemia in chronic kidney disease: Secondary | ICD-10-CM | POA: Diagnosis not present

## 2017-10-15 ENCOUNTER — Other Ambulatory Visit: Payer: Self-pay | Admitting: Family Medicine

## 2017-10-15 DIAGNOSIS — Z992 Dependence on renal dialysis: Secondary | ICD-10-CM | POA: Diagnosis not present

## 2017-10-15 DIAGNOSIS — D509 Iron deficiency anemia, unspecified: Secondary | ICD-10-CM | POA: Diagnosis not present

## 2017-10-15 DIAGNOSIS — Z23 Encounter for immunization: Secondary | ICD-10-CM | POA: Diagnosis not present

## 2017-10-15 DIAGNOSIS — N186 End stage renal disease: Secondary | ICD-10-CM | POA: Diagnosis not present

## 2017-10-15 DIAGNOSIS — D631 Anemia in chronic kidney disease: Secondary | ICD-10-CM | POA: Diagnosis not present

## 2017-10-17 DIAGNOSIS — N186 End stage renal disease: Secondary | ICD-10-CM | POA: Diagnosis not present

## 2017-10-17 DIAGNOSIS — D509 Iron deficiency anemia, unspecified: Secondary | ICD-10-CM | POA: Diagnosis not present

## 2017-10-17 DIAGNOSIS — Z992 Dependence on renal dialysis: Secondary | ICD-10-CM | POA: Diagnosis not present

## 2017-10-17 DIAGNOSIS — Z23 Encounter for immunization: Secondary | ICD-10-CM | POA: Diagnosis not present

## 2017-10-17 DIAGNOSIS — D631 Anemia in chronic kidney disease: Secondary | ICD-10-CM | POA: Diagnosis not present

## 2017-10-19 DIAGNOSIS — N186 End stage renal disease: Secondary | ICD-10-CM | POA: Diagnosis not present

## 2017-10-19 DIAGNOSIS — D509 Iron deficiency anemia, unspecified: Secondary | ICD-10-CM | POA: Diagnosis not present

## 2017-10-19 DIAGNOSIS — Z23 Encounter for immunization: Secondary | ICD-10-CM | POA: Diagnosis not present

## 2017-10-19 DIAGNOSIS — Z992 Dependence on renal dialysis: Secondary | ICD-10-CM | POA: Diagnosis not present

## 2017-10-19 DIAGNOSIS — D631 Anemia in chronic kidney disease: Secondary | ICD-10-CM | POA: Diagnosis not present

## 2017-10-21 DIAGNOSIS — M130 Polyarthritis, unspecified: Secondary | ICD-10-CM | POA: Diagnosis not present

## 2017-10-21 DIAGNOSIS — Z79891 Long term (current) use of opiate analgesic: Secondary | ICD-10-CM | POA: Diagnosis not present

## 2017-10-21 DIAGNOSIS — M545 Low back pain: Secondary | ICD-10-CM | POA: Diagnosis not present

## 2017-10-21 DIAGNOSIS — M25569 Pain in unspecified knee: Secondary | ICD-10-CM | POA: Diagnosis not present

## 2017-10-22 DIAGNOSIS — N186 End stage renal disease: Secondary | ICD-10-CM | POA: Diagnosis not present

## 2017-10-22 DIAGNOSIS — Z23 Encounter for immunization: Secondary | ICD-10-CM | POA: Diagnosis not present

## 2017-10-22 DIAGNOSIS — Z992 Dependence on renal dialysis: Secondary | ICD-10-CM | POA: Diagnosis not present

## 2017-10-22 DIAGNOSIS — D509 Iron deficiency anemia, unspecified: Secondary | ICD-10-CM | POA: Diagnosis not present

## 2017-10-22 DIAGNOSIS — D631 Anemia in chronic kidney disease: Secondary | ICD-10-CM | POA: Diagnosis not present

## 2017-10-24 DIAGNOSIS — D631 Anemia in chronic kidney disease: Secondary | ICD-10-CM | POA: Diagnosis not present

## 2017-10-24 DIAGNOSIS — D509 Iron deficiency anemia, unspecified: Secondary | ICD-10-CM | POA: Diagnosis not present

## 2017-10-24 DIAGNOSIS — Z992 Dependence on renal dialysis: Secondary | ICD-10-CM | POA: Diagnosis not present

## 2017-10-24 DIAGNOSIS — Z23 Encounter for immunization: Secondary | ICD-10-CM | POA: Diagnosis not present

## 2017-10-24 DIAGNOSIS — N186 End stage renal disease: Secondary | ICD-10-CM | POA: Diagnosis not present

## 2017-10-26 DIAGNOSIS — D509 Iron deficiency anemia, unspecified: Secondary | ICD-10-CM | POA: Diagnosis not present

## 2017-10-26 DIAGNOSIS — Z992 Dependence on renal dialysis: Secondary | ICD-10-CM | POA: Diagnosis not present

## 2017-10-26 DIAGNOSIS — N186 End stage renal disease: Secondary | ICD-10-CM | POA: Diagnosis not present

## 2017-10-26 DIAGNOSIS — Z23 Encounter for immunization: Secondary | ICD-10-CM | POA: Diagnosis not present

## 2017-10-26 DIAGNOSIS — D631 Anemia in chronic kidney disease: Secondary | ICD-10-CM | POA: Diagnosis not present

## 2017-10-29 DIAGNOSIS — D509 Iron deficiency anemia, unspecified: Secondary | ICD-10-CM | POA: Diagnosis not present

## 2017-10-29 DIAGNOSIS — N186 End stage renal disease: Secondary | ICD-10-CM | POA: Diagnosis not present

## 2017-10-29 DIAGNOSIS — D631 Anemia in chronic kidney disease: Secondary | ICD-10-CM | POA: Diagnosis not present

## 2017-10-29 DIAGNOSIS — Z992 Dependence on renal dialysis: Secondary | ICD-10-CM | POA: Diagnosis not present

## 2017-10-29 DIAGNOSIS — Z23 Encounter for immunization: Secondary | ICD-10-CM | POA: Diagnosis not present

## 2017-10-31 DIAGNOSIS — Z992 Dependence on renal dialysis: Secondary | ICD-10-CM | POA: Diagnosis not present

## 2017-10-31 DIAGNOSIS — D631 Anemia in chronic kidney disease: Secondary | ICD-10-CM | POA: Diagnosis not present

## 2017-10-31 DIAGNOSIS — N186 End stage renal disease: Secondary | ICD-10-CM | POA: Diagnosis not present

## 2017-10-31 DIAGNOSIS — Z23 Encounter for immunization: Secondary | ICD-10-CM | POA: Diagnosis not present

## 2017-10-31 DIAGNOSIS — D509 Iron deficiency anemia, unspecified: Secondary | ICD-10-CM | POA: Diagnosis not present

## 2017-11-02 DIAGNOSIS — N186 End stage renal disease: Secondary | ICD-10-CM | POA: Diagnosis not present

## 2017-11-02 DIAGNOSIS — Z992 Dependence on renal dialysis: Secondary | ICD-10-CM | POA: Diagnosis not present

## 2017-11-02 DIAGNOSIS — Z23 Encounter for immunization: Secondary | ICD-10-CM | POA: Diagnosis not present

## 2017-11-02 DIAGNOSIS — D631 Anemia in chronic kidney disease: Secondary | ICD-10-CM | POA: Diagnosis not present

## 2017-11-02 DIAGNOSIS — D509 Iron deficiency anemia, unspecified: Secondary | ICD-10-CM | POA: Diagnosis not present

## 2017-11-04 ENCOUNTER — Encounter: Payer: Self-pay | Admitting: Family Medicine

## 2017-11-04 ENCOUNTER — Ambulatory Visit (INDEPENDENT_AMBULATORY_CARE_PROVIDER_SITE_OTHER): Payer: Medicare Other | Admitting: Family Medicine

## 2017-11-04 VITALS — BP 147/68 | HR 76 | Temp 99.0°F | Ht <= 58 in | Wt 170.0 lb

## 2017-11-04 DIAGNOSIS — G894 Chronic pain syndrome: Secondary | ICD-10-CM

## 2017-11-04 NOTE — Progress Notes (Signed)
Subjective: CC: 3 month follow up HPI: Sherry Cox is a 57 y.o. female presenting to clinic today for:  1. Chronic pain syndrome Patient reports that her chronic pain has been under much better control since establishing with Dr. Merlene Laughter.  She notes she was told that she has fibromyalgia, degenerative joint disease in her low back and degenerative changes in her neck.  She notes that her medication regimen has been changed to Norco during the day and tramadol at night.  They have also added Cymbalta.  She reports improvement in energy, pain and sleep.  She is thinking about adding melatonin to the regimen for days when she feels too excited to go to sleep.  Denies excessive sedation now that the tramadol has been switched only at nighttime.  No constipation.  No falls.     ROS: Per HPI  Past Medical History:  Diagnosis Date  . Ankle injury   . Anxiety   . Anxiety disorder   . CAD (coronary artery disease)    Total RCA, stress echo [facility] 11/2008-no ischemia, EF 55%; h/o CABG  . Carotid artery stenosis    12/10/2008 doppler 83-66% RICA, 2-94% LICA/see evaluation by Dr. Oneida Alar 2011  . Ejection fraction    EF 60-65%, echo, June, 2009  . ESRD on dialysis [facility])    Transplant consideration  . Hemochromatosis   . Hx of CABG    Off pump LIMA to LAD  05/2006  . Hx of tobacco use, presenting hazards to health 04/26/2012  . Hyperlipidemia   . Irregular heart beat   . Murmur    October, 2012  . Neuropathy   . Overweight(278.02)   . Tobacco abuse    Allergies  Allergen Reactions  . Augmentin [Amoxicillin-Pot Clavulanate] Nausea And Vomiting    Vomiting    Current Outpatient Medications:  .  amLODipine (NORVASC) 10 MG tablet, Take 1 tablet (10 mg total) by mouth daily. (Patient taking differently: Take 10 mg by mouth at bedtime. ), Disp: 90 tablet, Rfl: 3 .  Artificial Tear Ointment (DRY EYES OP), Place 1 drop into both eyes daily., Disp: , Rfl:  .  aspirin EC 81 MG tablet, Take  81 mg by mouth 2 (two) times daily., Disp: , Rfl:  .  atorvastatin (LIPITOR) 80 MG tablet, TAKE ONE TABLET BY MOUTH DAILY, Disp: 90 tablet, Rfl: 1 .  clonazePAM (KLONOPIN) 0.5 MG tablet, Take 1 tablet (0.5 mg total) by mouth 4 (four) times daily., Disp: 120 tablet, Rfl: 2 .  DULoxetine (CYMBALTA) 60 MG capsule, Take 60 mg by mouth every other day., Disp: , Rfl: 0 .  fluticasone (FLONASE) 50 MCG/ACT nasal spray, Place 2 sprays into both nostrils daily., Disp: 16 g, Rfl: 6 .  folic acid (FOLVITE) 1 MG tablet, Take 1 tablet (1 mg total) by mouth daily., Disp: 90 tablet, Rfl: 4 .  folic acid-vitamin b complex-vitamin c-selenium-zinc (DIALYVITE) 3 MG TABS, Take 1 tablet by mouth daily.  , Disp: , Rfl:  .  HYDROcodone-acetaminophen (NORCO) 5-325 MG tablet, Take 1 tablet by mouth 2 (two) times daily as needed for moderate pain., Disp: 90 tablet, Rfl: 0 .  ibuprofen (ADVIL,MOTRIN) 200 MG tablet, Take 400 mg by mouth every 6 (six) hours as needed for mild pain., Disp: , Rfl:  .  levothyroxine (SYNTHROID, LEVOTHROID) 175 MCG tablet, Take 1 tablet (175 mcg total) by mouth daily before breakfast., Disp: 90 tablet, Rfl: 1 .  metoprolol tartrate (LOPRESSOR) 50 MG tablet, TAKE ONE TABLET  BY MOUTH TWICE DAILY ON MONDAY, [place]. TAKE ONE TABLET ON REMAINING DAYS., Disp: 70 tablet, Rfl: 1 .  NARCAN 4 MG/0.1ML LIQD nasal spray kit, Place 1 spray into the nose once as needed. overdose, Disp: , Rfl: 0 .  nitroGLYCERIN (NITROSTAT) 0.4 MG SL tablet, DISSOLVE ONE TABLET UNDER TONGUE EVERY 5 MINUTES UP TO 3 DOSES AS NEEDED FOR CHEST PAIN., Disp: 25 tablet, Rfl: 6 .  OXYGEN, Inhale into the lungs. On 2, Disp: , Rfl:  .  sertraline (ZOLOFT) 100 MG tablet, Take 1 tablet (100 mg total) by mouth daily., Disp: 90 tablet, Rfl: 3 .  sevelamer (RENVELA) 800 MG tablet, Take 1,600-4,000 mg by mouth as directed. Take 5 tablets (4026m) by mouth with meals and 1600 tablets by mouth with snacks., Disp: , Rfl:  .  sodium  bicarbonate 650 MG tablet, Take 650 mg by mouth 2 (two) times daily. , Disp: , Rfl:  .  sodium chloride (OCEAN) 0.65 % SOLN nasal spray, Place 1 spray into both nostrils as needed for congestion., Disp: , Rfl:  .  traMADol (ULTRAM) 50 MG tablet, take one at bedtime, Disp: , Rfl: 0 .  VENTOLIN HFA 108 (90 Base) MCG/ACT inhaler, INHALE TWO PUFFS INTO THE LUNGS EVERY 4 HOURS AS NEEDED FOR WHEEZING., Disp: 18 g, Rfl: 2 Social History   Socioeconomic History  . Marital status: Married    Spouse name: Not on file  . Number of children: Not on file  . Years of education: Not on file  . Highest education level: Not on file  Occupational History  . Occupation: DISABLED  Social Needs  . Financial resource strain: Not on file  . Food insecurity:    Worry: Not on file    Inability: Not on file  . Transportation needs:    Medical: Not on file    Non-medical: Not on file  Tobacco Use  . Smoking status: Current Every Day Smoker    Packs/day: 0.10    Years: 30.00    Pack years: 3.00    Types: Cigarettes    Start date: 02/02/1972  . Smokeless tobacco: Never Used  . Tobacco comment: 2 cigarettes every 3 days  Substance and Sexual Activity  . Alcohol use: No    Alcohol/week: 0.0 oz  . Drug use: No  . Sexual activity: Yes  Lifestyle  . Physical activity:    Days per week: Not on file    Minutes per session: Not on file  . Stress: Not on file  Relationships  . Social connections:    Talks on phone: Not on file    Gets together: Not on file    Attends religious service: Not on file    Active member of club or organization: Not on file    Attends meetings of clubs or organizations: Not on file    Relationship status: Not on file  . Intimate partner violence:    Fear of current or ex partner: Not on file    Emotionally abused: Not on file    Physically abused: Not on file    Forced sexual activity: Not on file  Other Topics Concern  . Not on file  Social History Narrative  . Not on  file   Family History  Problem Relation Age of Onset  . Depression Sister   . Dementia Sister   . OCD Sister   . Diabetes Sister   . Hypertension Sister   . Heart attack Sister   .  Bipolar disorder Brother   . Drug abuse Brother   . Diabetes Brother   . Hypertension Brother   . Heart disease Brother   . Hemochromatosis Brother   . Bipolar disorder Other   . ADD / ADHD Other   . Seizures Other   . Bipolar disorder Other   . Alcohol abuse Other   . Dementia Mother   . Heart disease Mother   . Hypertension Mother   . Heart attack Mother   . Alcohol abuse Father   . Heart disease Father        Heart Disease before age 28  . Hypertension Father   . Heart attack Father   . Dementia Maternal Aunt   . OCD Sister   . Heart disease Sister        Heart Disease before age 79  . Diabetes Sister   . Hemochromatosis Sister   . Hypertension Sister   . Rheum arthritis Sister   . Heart attack Brother   . COPD Brother   . Heart attack Brother   . Paranoid behavior Neg Hx   . Schizophrenia Neg Hx   . Sexual abuse Neg Hx   . Physical abuse Neg Hx     Objective: Office vital signs reviewed. BP (!) 147/68   Pulse 76   Temp 99 F (37.2 C) (Oral)   Ht _0  (1.473 m)   Wt 170 lb (77.1 kg)   BMI 35.53 kg/m   Physical Examination:  General: Awake, alert, obese, No acute distress HEENT: sclera white, MMM Cardio: regular rate and rhythm, S1S2 heard, blowing murmur appreciated; Left AVF with palpable thrill Pulm: clear to auscultation bilaterally, no wheezes, rhonchi or rales; normal work of breathing on room air  Assessment/ Plan: 57 y.o. female   1. Chronic pain syndrome Doing well on current medication regimen.  We discussed that melatonin is likely fine to start at a low dose.  I also gave her a handout with regards to sleep hygiene.  Greatly appreciate Dr. Freddie Apley assistance with this patient's needs.  Will defer to their office for further medication titration and  management.  Patient to follow-up as needed.   Janora Norlander, DO [place] [phone]

## 2017-11-05 ENCOUNTER — Other Ambulatory Visit: Payer: Self-pay | Admitting: Cardiology

## 2017-11-05 DIAGNOSIS — N186 End stage renal disease: Secondary | ICD-10-CM | POA: Diagnosis not present

## 2017-11-05 DIAGNOSIS — D631 Anemia in chronic kidney disease: Secondary | ICD-10-CM | POA: Diagnosis not present

## 2017-11-05 DIAGNOSIS — Z23 Encounter for immunization: Secondary | ICD-10-CM | POA: Diagnosis not present

## 2017-11-05 DIAGNOSIS — D509 Iron deficiency anemia, unspecified: Secondary | ICD-10-CM | POA: Diagnosis not present

## 2017-11-05 DIAGNOSIS — Z992 Dependence on renal dialysis: Secondary | ICD-10-CM | POA: Diagnosis not present

## 2017-11-06 ENCOUNTER — Other Ambulatory Visit: Payer: Self-pay | Admitting: Cardiology

## 2017-11-06 ENCOUNTER — Other Ambulatory Visit: Payer: Self-pay | Admitting: Family Medicine

## 2017-11-06 ENCOUNTER — Ambulatory Visit (HOSPITAL_COMMUNITY): Payer: Medicare Other | Admitting: Psychiatry

## 2017-11-06 ENCOUNTER — Other Ambulatory Visit (HOSPITAL_COMMUNITY): Payer: Self-pay | Admitting: Psychiatry

## 2017-11-07 ENCOUNTER — Telehealth (HOSPITAL_COMMUNITY): Payer: Self-pay

## 2017-11-07 ENCOUNTER — Other Ambulatory Visit (HOSPITAL_COMMUNITY): Payer: Self-pay | Admitting: Psychiatry

## 2017-11-07 DIAGNOSIS — D631 Anemia in chronic kidney disease: Secondary | ICD-10-CM | POA: Diagnosis not present

## 2017-11-07 DIAGNOSIS — D509 Iron deficiency anemia, unspecified: Secondary | ICD-10-CM | POA: Diagnosis not present

## 2017-11-07 DIAGNOSIS — Z992 Dependence on renal dialysis: Secondary | ICD-10-CM | POA: Diagnosis not present

## 2017-11-07 DIAGNOSIS — N186 End stage renal disease: Secondary | ICD-10-CM | POA: Diagnosis not present

## 2017-11-07 MED ORDER — CLONAZEPAM 0.5 MG PO TABS: 0.5000 mg | ORAL_TABLET | Freq: Four times a day (QID) | ORAL | 0 refills | Status: DC

## 2017-11-07 NOTE — Telephone Encounter (Signed)
Pharmacy called saying that the patient needs a refill on Clonazepam 0.5mg  tablets. Next appointment is schedule 11-13-17. Please advise

## 2017-11-07 NOTE — Telephone Encounter (Signed)
Called the pharmacy and informed them that the medication has been sent.

## 2017-11-07 NOTE — Telephone Encounter (Signed)
Ordered.   I have utilized the [place] Controlled Substances Reporting System (PMP AWARxE) to confirm adherence regarding the patient's medication. My review reveals appropriate prescription fills.

## 2017-11-09 DIAGNOSIS — Z992 Dependence on renal dialysis: Secondary | ICD-10-CM | POA: Diagnosis not present

## 2017-11-09 DIAGNOSIS — D631 Anemia in chronic kidney disease: Secondary | ICD-10-CM | POA: Diagnosis not present

## 2017-11-09 DIAGNOSIS — N186 End stage renal disease: Secondary | ICD-10-CM | POA: Diagnosis not present

## 2017-11-09 DIAGNOSIS — D509 Iron deficiency anemia, unspecified: Secondary | ICD-10-CM | POA: Diagnosis not present

## 2017-11-12 DIAGNOSIS — Z992 Dependence on renal dialysis: Secondary | ICD-10-CM | POA: Diagnosis not present

## 2017-11-12 DIAGNOSIS — D509 Iron deficiency anemia, unspecified: Secondary | ICD-10-CM | POA: Diagnosis not present

## 2017-11-12 DIAGNOSIS — D631 Anemia in chronic kidney disease: Secondary | ICD-10-CM | POA: Diagnosis not present

## 2017-11-12 DIAGNOSIS — N186 End stage renal disease: Secondary | ICD-10-CM | POA: Diagnosis not present

## 2017-11-13 ENCOUNTER — Ambulatory Visit (INDEPENDENT_AMBULATORY_CARE_PROVIDER_SITE_OTHER): Payer: Medicare Other | Admitting: Psychiatry

## 2017-11-13 ENCOUNTER — Encounter (HOSPITAL_COMMUNITY): Payer: Self-pay | Admitting: Psychiatry

## 2017-11-13 DIAGNOSIS — Z813 Family history of other psychoactive substance abuse and dependence: Secondary | ICD-10-CM

## 2017-11-13 DIAGNOSIS — Z818 Family history of other mental and behavioral disorders: Secondary | ICD-10-CM | POA: Diagnosis not present

## 2017-11-13 DIAGNOSIS — I6523 Occlusion and stenosis of bilateral carotid arteries: Secondary | ICD-10-CM

## 2017-11-13 DIAGNOSIS — Z736 Limitation of activities due to disability: Secondary | ICD-10-CM | POA: Diagnosis not present

## 2017-11-13 DIAGNOSIS — Z810 Family history of intellectual disabilities: Secondary | ICD-10-CM

## 2017-11-13 DIAGNOSIS — F331 Major depressive disorder, recurrent, moderate: Secondary | ICD-10-CM | POA: Diagnosis not present

## 2017-11-13 DIAGNOSIS — F17210 Nicotine dependence, cigarettes, uncomplicated: Secondary | ICD-10-CM

## 2017-11-13 DIAGNOSIS — Z811 Family history of alcohol abuse and dependence: Secondary | ICD-10-CM | POA: Diagnosis not present

## 2017-11-13 DIAGNOSIS — M2550 Pain in unspecified joint: Secondary | ICD-10-CM

## 2017-11-13 DIAGNOSIS — Z91419 Personal history of unspecified adult abuse: Secondary | ICD-10-CM

## 2017-11-13 DIAGNOSIS — M542 Cervicalgia: Secondary | ICD-10-CM

## 2017-11-13 DIAGNOSIS — N19 Unspecified kidney failure: Secondary | ICD-10-CM | POA: Diagnosis not present

## 2017-11-13 DIAGNOSIS — M549 Dorsalgia, unspecified: Secondary | ICD-10-CM | POA: Diagnosis not present

## 2017-11-13 MED ORDER — CLONAZEPAM 0.5 MG PO TABS: 0.5000 mg | ORAL_TABLET | Freq: Four times a day (QID) | ORAL | 2 refills | Status: DC

## 2017-11-13 MED ORDER — DULOXETINE HCL 60 MG PO CPEP: 60.0000 mg | ORAL_CAPSULE | Freq: Every day | ORAL | 2 refills | Status: DC

## 2017-11-13 MED ORDER — SERTRALINE HCL 100 MG PO TABS: 100.0000 mg | ORAL_TABLET | Freq: Every day | ORAL | 3 refills | Status: DC
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
## 2017-11-14 DIAGNOSIS — D631 Anemia in chronic kidney disease: Secondary | ICD-10-CM | POA: Diagnosis not present

## 2017-11-14 DIAGNOSIS — Z992 Dependence on renal dialysis: Secondary | ICD-10-CM | POA: Diagnosis not present

## 2017-11-14 DIAGNOSIS — D509 Iron deficiency anemia, unspecified: Secondary | ICD-10-CM | POA: Diagnosis not present

## 2017-11-14 DIAGNOSIS — N186 End stage renal disease: Secondary | ICD-10-CM | POA: Diagnosis not present

## 2017-11-16 DIAGNOSIS — Z992 Dependence on renal dialysis: Secondary | ICD-10-CM | POA: Diagnosis not present

## 2017-11-16 DIAGNOSIS — D631 Anemia in chronic kidney disease: Secondary | ICD-10-CM | POA: Diagnosis not present

## 2017-11-16 DIAGNOSIS — N186 End stage renal disease: Secondary | ICD-10-CM | POA: Diagnosis not present

## 2017-11-16 DIAGNOSIS — D509 Iron deficiency anemia, unspecified: Secondary | ICD-10-CM | POA: Diagnosis not present

## 2017-11-18 DIAGNOSIS — Z79891 Long term (current) use of opiate analgesic: Secondary | ICD-10-CM | POA: Diagnosis not present

## 2017-11-18 DIAGNOSIS — M25569 Pain in unspecified knee: Secondary | ICD-10-CM | POA: Diagnosis not present

## 2017-11-18 DIAGNOSIS — M130 Polyarthritis, unspecified: Secondary | ICD-10-CM | POA: Diagnosis not present

## 2017-11-18 DIAGNOSIS — M545 Low back pain: Secondary | ICD-10-CM | POA: Diagnosis not present

## 2017-11-19 DIAGNOSIS — D631 Anemia in chronic kidney disease: Secondary | ICD-10-CM | POA: Diagnosis not present

## 2017-11-19 DIAGNOSIS — N186 End stage renal disease: Secondary | ICD-10-CM | POA: Diagnosis not present

## 2017-11-19 DIAGNOSIS — Z992 Dependence on renal dialysis: Secondary | ICD-10-CM | POA: Diagnosis not present

## 2017-11-19 DIAGNOSIS — D509 Iron deficiency anemia, unspecified: Secondary | ICD-10-CM | POA: Diagnosis not present

## 2017-11-21 DIAGNOSIS — D509 Iron deficiency anemia, unspecified: Secondary | ICD-10-CM | POA: Diagnosis not present

## 2017-11-21 DIAGNOSIS — Z992 Dependence on renal dialysis: Secondary | ICD-10-CM | POA: Diagnosis not present

## 2017-11-21 DIAGNOSIS — N186 End stage renal disease: Secondary | ICD-10-CM | POA: Diagnosis not present

## 2017-11-21 DIAGNOSIS — D631 Anemia in chronic kidney disease: Secondary | ICD-10-CM | POA: Diagnosis not present

## 2017-11-23 DIAGNOSIS — Z992 Dependence on renal dialysis: Secondary | ICD-10-CM | POA: Diagnosis not present

## 2017-11-23 DIAGNOSIS — D509 Iron deficiency anemia, unspecified: Secondary | ICD-10-CM | POA: Diagnosis not present

## 2017-11-23 DIAGNOSIS — D631 Anemia in chronic kidney disease: Secondary | ICD-10-CM | POA: Diagnosis not present

## 2017-11-23 DIAGNOSIS — N186 End stage renal disease: Secondary | ICD-10-CM | POA: Diagnosis not present

## 2017-11-26 DIAGNOSIS — D509 Iron deficiency anemia, unspecified: Secondary | ICD-10-CM | POA: Diagnosis not present

## 2017-11-26 DIAGNOSIS — D631 Anemia in chronic kidney disease: Secondary | ICD-10-CM | POA: Diagnosis not present

## 2017-11-26 DIAGNOSIS — N186 End stage renal disease: Secondary | ICD-10-CM | POA: Diagnosis not present

## 2017-11-26 DIAGNOSIS — Z992 Dependence on renal dialysis: Secondary | ICD-10-CM | POA: Diagnosis not present

## 2017-11-28 DIAGNOSIS — D631 Anemia in chronic kidney disease: Secondary | ICD-10-CM | POA: Diagnosis not present

## 2017-11-28 DIAGNOSIS — D509 Iron deficiency anemia, unspecified: Secondary | ICD-10-CM | POA: Diagnosis not present

## 2017-11-28 DIAGNOSIS — N186 End stage renal disease: Secondary | ICD-10-CM | POA: Diagnosis not present

## 2017-11-28 DIAGNOSIS — Z992 Dependence on renal dialysis: Secondary | ICD-10-CM | POA: Diagnosis not present

## 2017-11-30 DIAGNOSIS — Z992 Dependence on renal dialysis: Secondary | ICD-10-CM | POA: Diagnosis not present

## 2017-11-30 DIAGNOSIS — D509 Iron deficiency anemia, unspecified: Secondary | ICD-10-CM | POA: Diagnosis not present

## 2017-11-30 DIAGNOSIS — N186 End stage renal disease: Secondary | ICD-10-CM | POA: Diagnosis not present

## 2017-11-30 DIAGNOSIS — D631 Anemia in chronic kidney disease: Secondary | ICD-10-CM | POA: Diagnosis not present

## 2017-12-03 DIAGNOSIS — D631 Anemia in chronic kidney disease: Secondary | ICD-10-CM | POA: Diagnosis not present

## 2017-12-03 DIAGNOSIS — Z992 Dependence on renal dialysis: Secondary | ICD-10-CM | POA: Diagnosis not present

## 2017-12-03 DIAGNOSIS — N186 End stage renal disease: Secondary | ICD-10-CM | POA: Diagnosis not present

## 2017-12-03 DIAGNOSIS — D509 Iron deficiency anemia, unspecified: Secondary | ICD-10-CM | POA: Diagnosis not present

## 2017-12-05 DIAGNOSIS — Z992 Dependence on renal dialysis: Secondary | ICD-10-CM | POA: Diagnosis not present

## 2017-12-05 DIAGNOSIS — D509 Iron deficiency anemia, unspecified: Secondary | ICD-10-CM | POA: Diagnosis not present

## 2017-12-05 DIAGNOSIS — N186 End stage renal disease: Secondary | ICD-10-CM | POA: Diagnosis not present

## 2017-12-05 DIAGNOSIS — D631 Anemia in chronic kidney disease: Secondary | ICD-10-CM | POA: Diagnosis not present

## 2017-12-06 DIAGNOSIS — N186 End stage renal disease: Secondary | ICD-10-CM | POA: Diagnosis not present

## 2017-12-06 DIAGNOSIS — Z992 Dependence on renal dialysis: Secondary | ICD-10-CM | POA: Diagnosis not present

## 2017-12-07 ENCOUNTER — Other Ambulatory Visit: Payer: Self-pay | Admitting: Cardiology

## 2017-12-07 DIAGNOSIS — Z992 Dependence on renal dialysis: Secondary | ICD-10-CM | POA: Diagnosis not present

## 2017-12-07 DIAGNOSIS — D509 Iron deficiency anemia, unspecified: Secondary | ICD-10-CM | POA: Diagnosis not present

## 2017-12-07 DIAGNOSIS — N2581 Secondary hyperparathyroidism of renal origin: Secondary | ICD-10-CM | POA: Diagnosis not present

## 2017-12-07 DIAGNOSIS — D631 Anemia in chronic kidney disease: Secondary | ICD-10-CM | POA: Diagnosis not present

## 2017-12-07 DIAGNOSIS — N186 End stage renal disease: Secondary | ICD-10-CM | POA: Diagnosis not present

## 2017-12-09 ENCOUNTER — Other Ambulatory Visit: Payer: Self-pay | Admitting: Cardiology

## 2017-12-09 ENCOUNTER — Other Ambulatory Visit: Payer: Self-pay | Admitting: Family Medicine

## 2017-12-10 DIAGNOSIS — D509 Iron deficiency anemia, unspecified: Secondary | ICD-10-CM | POA: Diagnosis not present

## 2017-12-10 DIAGNOSIS — Z992 Dependence on renal dialysis: Secondary | ICD-10-CM | POA: Diagnosis not present

## 2017-12-10 DIAGNOSIS — N2581 Secondary hyperparathyroidism of renal origin: Secondary | ICD-10-CM | POA: Diagnosis not present

## 2017-12-10 DIAGNOSIS — N186 End stage renal disease: Secondary | ICD-10-CM | POA: Diagnosis not present

## 2017-12-10 DIAGNOSIS — D631 Anemia in chronic kidney disease: Secondary | ICD-10-CM | POA: Diagnosis not present

## 2017-12-12 DIAGNOSIS — Z992 Dependence on renal dialysis: Secondary | ICD-10-CM | POA: Diagnosis not present

## 2017-12-12 DIAGNOSIS — D509 Iron deficiency anemia, unspecified: Secondary | ICD-10-CM | POA: Diagnosis not present

## 2017-12-12 DIAGNOSIS — N186 End stage renal disease: Secondary | ICD-10-CM | POA: Diagnosis not present

## 2017-12-12 DIAGNOSIS — N2581 Secondary hyperparathyroidism of renal origin: Secondary | ICD-10-CM | POA: Diagnosis not present

## 2017-12-12 DIAGNOSIS — D631 Anemia in chronic kidney disease: Secondary | ICD-10-CM | POA: Diagnosis not present

## 2017-12-14 DIAGNOSIS — D509 Iron deficiency anemia, unspecified: Secondary | ICD-10-CM | POA: Diagnosis not present

## 2017-12-14 DIAGNOSIS — N2581 Secondary hyperparathyroidism of renal origin: Secondary | ICD-10-CM | POA: Diagnosis not present

## 2017-12-14 DIAGNOSIS — N186 End stage renal disease: Secondary | ICD-10-CM | POA: Diagnosis not present

## 2017-12-14 DIAGNOSIS — D631 Anemia in chronic kidney disease: Secondary | ICD-10-CM | POA: Diagnosis not present

## 2017-12-14 DIAGNOSIS — Z992 Dependence on renal dialysis: Secondary | ICD-10-CM | POA: Diagnosis not present

## 2017-12-16 ENCOUNTER — Other Ambulatory Visit: Payer: Self-pay | Admitting: Cardiology

## 2017-12-17 DIAGNOSIS — D509 Iron deficiency anemia, unspecified: Secondary | ICD-10-CM | POA: Diagnosis not present

## 2017-12-17 DIAGNOSIS — N2581 Secondary hyperparathyroidism of renal origin: Secondary | ICD-10-CM | POA: Diagnosis not present

## 2017-12-17 DIAGNOSIS — N186 End stage renal disease: Secondary | ICD-10-CM | POA: Diagnosis not present

## 2017-12-17 DIAGNOSIS — D631 Anemia in chronic kidney disease: Secondary | ICD-10-CM | POA: Diagnosis not present

## 2017-12-17 DIAGNOSIS — Z992 Dependence on renal dialysis: Secondary | ICD-10-CM | POA: Diagnosis not present

## 2017-12-18 DIAGNOSIS — M25569 Pain in unspecified knee: Secondary | ICD-10-CM | POA: Diagnosis not present

## 2017-12-18 DIAGNOSIS — M130 Polyarthritis, unspecified: Secondary | ICD-10-CM | POA: Diagnosis not present

## 2017-12-18 DIAGNOSIS — Z79891 Long term (current) use of opiate analgesic: Secondary | ICD-10-CM | POA: Diagnosis not present

## 2017-12-18 DIAGNOSIS — M545 Low back pain: Secondary | ICD-10-CM | POA: Diagnosis not present

## 2017-12-19 DIAGNOSIS — N186 End stage renal disease: Secondary | ICD-10-CM | POA: Diagnosis not present

## 2017-12-19 DIAGNOSIS — N2581 Secondary hyperparathyroidism of renal origin: Secondary | ICD-10-CM | POA: Diagnosis not present

## 2017-12-19 DIAGNOSIS — D509 Iron deficiency anemia, unspecified: Secondary | ICD-10-CM | POA: Diagnosis not present

## 2017-12-19 DIAGNOSIS — Z992 Dependence on renal dialysis: Secondary | ICD-10-CM | POA: Diagnosis not present

## 2017-12-19 DIAGNOSIS — D631 Anemia in chronic kidney disease: Secondary | ICD-10-CM | POA: Diagnosis not present

## 2017-12-20 ENCOUNTER — Encounter: Payer: Self-pay | Admitting: *Deleted

## 2017-12-20 ENCOUNTER — Other Ambulatory Visit: Payer: Self-pay

## 2017-12-20 ENCOUNTER — Encounter: Payer: Self-pay | Admitting: Cardiology

## 2017-12-20 ENCOUNTER — Ambulatory Visit (INDEPENDENT_AMBULATORY_CARE_PROVIDER_SITE_OTHER): Payer: Medicare Other | Admitting: Cardiology

## 2017-12-20 VITALS — BP 176/76 | HR 84 | Ht <= 58 in | Wt 167.0 lb

## 2017-12-20 DIAGNOSIS — I10 Essential (primary) hypertension: Secondary | ICD-10-CM

## 2017-12-20 DIAGNOSIS — E782 Mixed hyperlipidemia: Secondary | ICD-10-CM

## 2017-12-20 DIAGNOSIS — I2510 Atherosclerotic heart disease of native coronary artery without angina pectoris: Secondary | ICD-10-CM | POA: Diagnosis not present

## 2017-12-20 DIAGNOSIS — I6523 Occlusion and stenosis of bilateral carotid arteries: Secondary | ICD-10-CM

## 2017-12-20 NOTE — Patient Instructions (Signed)

## 2017-12-20 NOTE — Progress Notes (Signed)
Clinical Summary Sherry Cox is a 57 y.o.female seen today for follow up of the following medical problems.  1. CAD - prior CABG in 2007   - no recent chest pain. Denies any SOB/DOE - compliant with meds  2. Carotid stenosis - followed by vascular - 09/2017 carotid US RICA 39-76%, LICA 73-41% - no recent symptoms  3. ESRD - compliant with HD  4. Hyperlipidemia - 09/2016 TC 110 HDL 55 LDL 39 She is compliant with statin  5. HTN - has not taken meds yet today  6. Moderate mitral regurgitation -noted by 09/2017 echo -= no recent symptoms   7. Secundum ASD - mild RV enlargement, normal function by echo  8. Chronic pain syndrome - followed by pcp Past Medical History:  Diagnosis Date  . Ankle injury   . Anxiety   . Anxiety disorder   . CAD (coronary artery disease)    Total RCA, stress echo [facility] 11/2008-no ischemia, EF 55%; h/o CABG  . Carotid artery stenosis    12/10/2008 doppler 93-79% RICA, 0-24% LICA/see evaluation by Dr. Oneida Alar 2011  . Ejection fraction    EF 60-65%, echo, June, 2009  . ESRD on dialysis [facility])    Transplant consideration  . Hemochromatosis   . Hx of CABG    Off pump LIMA to LAD  05/2006  . Hx of tobacco use, presenting hazards to health 04/26/2012  . Hyperlipidemia   . Irregular heart beat   . Murmur    October, 2012  . Neuropathy   . Overweight(278.02)   . Tobacco abuse      Allergies  Allergen Reactions  . Augmentin [Amoxicillin-Pot Clavulanate] Nausea And Vomiting    Vomiting     Current Outpatient Medications  Medication Sig Dispense Refill  . amLODipine (NORVASC) 10 MG tablet Take 1 tablet (10 mg total) by mouth daily. (Patient taking differently: Take 10 mg by mouth at bedtime. ) 90 tablet 3  . Artificial Tear Ointment (DRY EYES OP) Place 1 drop into both eyes daily.    Marland Kitchen aspirin EC 81 MG tablet Take 81 mg by mouth 2 (two) times daily.    Marland Kitchen atorvastatin (LIPITOR) 80 MG tablet TAKE ONE TABLET BY MOUTH DAILY 90  tablet 1  . clonazePAM (KLONOPIN) 0.5 MG tablet Take 1 tablet (0.5 mg total) by mouth 4 (four) times daily. 120 tablet 2  . DULoxetine (CYMBALTA) 60 MG capsule Take 1 capsule (60 mg total) by mouth daily. 30 capsule 2  . fluticasone (FLONASE) 50 MCG/ACT nasal spray USE 2 SPRAYS IN EACH NOSTRIL ONCE DAILY.  SHAKE GENTLY BEFORE USING. 16 g 6  . folic acid (FOLVITE) 1 MG tablet Take 1 tablet (1 mg total) by mouth daily. 90 tablet 4  . folic acid-vitamin b complex-vitamin c-selenium-zinc (DIALYVITE) 3 MG TABS Take 1 tablet by mouth daily.      Marland Kitchen HYDROcodone-acetaminophen (NORCO) 5-325 MG tablet Take 1 tablet by mouth 2 (two) times daily as needed for moderate pain. 90 tablet 0  . ibuprofen (ADVIL,MOTRIN) 200 MG tablet Take 400 mg by mouth every 6 (six) hours as needed for mild pain.    Marland Kitchen levothyroxine (SYNTHROID, LEVOTHROID) 175 MCG tablet TAKE ONE TABLET BY MOUTH DAILY BEFORE BREAKFAST. 90 tablet 1  . metoprolol tartrate (LOPRESSOR) 50 MG tablet TAKE ONE TABLET BY MOUTH TWICE DAILY ON MONDAY, WEDNESDAY AND FRIDAY. TAKE ONE TABLET ON REMAINING DAYS. 70 tablet 1  . NARCAN 4 MG/0.1ML LIQD nasal spray kit Place 1  spray into the nose once as needed. overdose  0  . nitroGLYCERIN (NITROSTAT) 0.4 MG SL tablet DISSOLVE ONE TABLET UNDER TONGUE EVERY 5 MINUTES UP TO 3 DOSES AS NEEDED FOR CHEST PAIN. 25 tablet 3  . OXYGEN Inhale into the lungs. On 2    . sertraline (ZOLOFT) 100 MG tablet Take 1 tablet (100 mg total) by mouth daily. 90 tablet 3  . sevelamer (RENVELA) 800 MG tablet Take 1,600-4,000 mg by mouth as directed. Take 5 tablets (4020m) by mouth with meals and 1600 tablets by mouth with snacks.    . sodium bicarbonate 650 MG tablet Take 650 mg by mouth 2 (two) times daily.     . sodium chloride (OCEAN) 0.65 % SOLN nasal spray Place 1 spray into both nostrils as needed for congestion.    . traMADol (ULTRAM) 50 MG tablet take one at bedtime  0  . VENTOLIN HFA 108 (90 Base) MCG/ACT inhaler INHALE TWO PUFFS  INTO THE LUNGS EVERY 4 HOURS AS NEEDED FOR WHEEZING. 18 g 2   No current facility-administered medications for this visit.      Past Surgical History:  Procedure Laterality Date  . AV FISTULA PLACEMENT     [facility] . CARDIAC VALVE SURGERY  2007  . CAROTID ANGIOGRAM Bilateral 03/06/2013   Procedure: CAROTID ANGIOGRAM;  Surgeon: CElam Dutch MD;  [facility];  Service: Cardiovascular;  Laterality: Bilateral;  . CHOLECYSTECTOMY  1995   Gall Bladder  . CORONARY ARTERY BYPASS GRAFT  05/2006   Off pump LIMA to LAD  . INNER EAR SURGERY    . ORIF PATELLA Right 03/11/2017   Procedure: OPEN REDUCTION INTERNAL (ORIF) FIXATION PATELLA;  Surgeon: SRod Can MD;  [place]  Service: Orthopedics;  Laterality: Right;  . Plastic Surgery on leg       Allergies  Allergen Reactions  . Augmentin [Amoxicillin-Pot Clavulanate] Nausea And Vomiting    Vomiting      Family History  Problem Relation Age of Onset  . Depression Sister   . Dementia Sister   . OCD Sister   . Diabetes Sister   . Hypertension Sister   . Heart attack Sister   . Bipolar disorder Brother   . Drug abuse Brother   . Diabetes Brother   . Hypertension Brother   . Heart disease Brother   . Hemochromatosis Brother   . Bipolar disorder Other   . ADD / ADHD Other   . Seizures Other   . Bipolar disorder Other   . Alcohol abuse Other   . Dementia Mother   . Heart disease Mother   . Hypertension Mother   . Heart attack Mother   . Alcohol abuse Father   . Heart disease Father        Heart Disease before age 52245 . Hypertension Father   . Heart attack Father   . Dementia Maternal Aunt   . OCD Sister   . Heart disease Sister        Heart Disease before age 52274 . Diabetes Sister   . Hemochromatosis Sister   . Hypertension Sister   . Rheum arthritis Sister   . Heart attack Brother   . COPD Brother   . Heart attack Brother   . Paranoid behavior Neg  Hx   . Schizophrenia Neg Hx   . Sexual abuse Neg Hx   . Physical abuse Neg Hx  Social History Sherry Cox reports that she has been smoking cigarettes.  She started smoking about 45 years ago. She has a 3.00 pack-year smoking history. She has never used smokeless tobacco. Sherry Cox reports that she does not drink alcohol.   Review of Systems CONSTITUTIONAL: No weight loss, fever, chills, weakness or fatigue.  HEENT: Eyes: No visual loss, blurred vision, double vision or yellow sclerae.No hearing loss, sneezing, congestion, runny nose or sore throat.  SKIN: No rash or itching.  CARDIOVASCULAR: per hpi RESPIRATORY: No shortness of breath, cough or sputum.  GASTROINTESTINAL: No anorexia, nausea, vomiting or diarrhea. No abdominal pain or blood.  GENITOURINARY: No burning on urination, no polyuria NEUROLOGICAL: No headache, dizziness, syncope, paralysis, ataxia, numbness or tingling in the extremities. No change in bowel or bladder control.  MUSCULOSKELETAL: No muscle, back pain, joint pain or stiffness.  LYMPHATICS: No enlarged nodes. No history of splenectomy.  PSYCHIATRIC: No history of depression or anxiety.  ENDOCRINOLOGIC: No reports of sweating, cold or heat intolerance. No polyuria or polydipsia.  Marland Kitchen   Physical Examination Vitals:   12/20/17 0845  BP: (!) 176/76  Pulse: 84  SpO2: 96%   Vitals:   12/20/17 0845  Weight: 167 lb (75.8 kg)  Height: '4\' 10"'  (1.473 m)    Gen: resting comfortably, no acute distress HEENT: no scleral icterus, pupils equal round and reactive, no palptable cervical adenopathy,  CV: RRR< no m/r/g, no jvd Resp: Clear to auscultation bilaterally GI: abdomen is soft, non-tender, non-distended, normal bowel sounds, no hepatosplenomegaly MSK: extremities are warm, no edema.  Skin: warm, no rash Neuro:  no focal deficits Psych: appropriate affect   Diagnostic Studies 04/2011 echo Study Conclusions  - Left ventricle: The cavity size was  normal. Systolic function was normal. The estimated ejection fraction was in the range of 60% to 65%. Wall motion was normal; there were no regional wall motion abnormalities. Doppler parameters are consistent with abnormal left ventricular relaxation (grade 1 diastolic dysfunction). - Aortic valve: Valve area: 1.38cm^2(VTI). Valve area: 1.13cm^2 (Vmax).   01/2015 Carotid US RICA 40-59%, left bifurcatoin 40-59%   09/2017 echo Study Conclusions  - Left ventricle: The cavity size was normal. Wall thickness was   normal. The estimated ejection fraction was 55%. There is   hypokinesis of the basal-midinferolateral and inferior   myocardium. The study is not technically sufficient to allow   evaluation of LV diastolic function. - Aortic valve: Poorly visualized. Unable to assess leaflet   structure. Moderately calcified. - Mitral valve: Mildly to moderately calcified annulus. There was   moderate regurgitation. - Left atrium: The atrium was severely dilated. - Right ventricle: The cavity size was mildly dilated. - Right atrium: Central venous pressure (est): 3 mm Hg. - Atrial septum: Apparent secundum ASD with left to right flow by   color Doppler. - Tricuspid valve: There was mild regurgitation. - Pulmonary arteries: Systolic pressure was mildly increased. PA   peak pressure: 41 mm Hg (S). - Pericardium, extracardiac: There was no pericardial effusion.   09/2017 carotid US Final Interpretation: Right Carotid: Velocities in the right ICA are consistent with a 40-59%        stenosis. The ECA appears >50% stenosed. Based on peak systolic        velocity and moderate plaque formation.  Left Carotid: Velocities in the left ICA are consistent with a 40-59% stenosis.       The ECA appears >50% stenosed. Upper end of range based on peak  systolic velocity and plaque fromation.  Vertebrals: Right vertebral artery demonstrates  antegrade flow. Left vertebral       artery demonstrates bidirectional flow. Left AVF. Subclavians: Bilateral subclavian artery flow was disturbed. Left AVF.    Assessment and Plan   1. CAD - no symptoms, continue current meds    2. Carotid stenosis - continue to follow with vascular - repeat carotid US  3. HTN - elevated clinic, has not taken meds yet today - continue to monitor at this time.   4. Hyperlipidemia -continue statin, request labs from pcp         Arnoldo Lenis, M.D.

## 2017-12-21 DIAGNOSIS — D631 Anemia in chronic kidney disease: Secondary | ICD-10-CM | POA: Diagnosis not present

## 2017-12-21 DIAGNOSIS — Z992 Dependence on renal dialysis: Secondary | ICD-10-CM | POA: Diagnosis not present

## 2017-12-21 DIAGNOSIS — D509 Iron deficiency anemia, unspecified: Secondary | ICD-10-CM | POA: Diagnosis not present

## 2017-12-21 DIAGNOSIS — N186 End stage renal disease: Secondary | ICD-10-CM | POA: Diagnosis not present

## 2017-12-21 DIAGNOSIS — N2581 Secondary hyperparathyroidism of renal origin: Secondary | ICD-10-CM | POA: Diagnosis not present

## 2017-12-24 DIAGNOSIS — Z992 Dependence on renal dialysis: Secondary | ICD-10-CM | POA: Diagnosis not present

## 2017-12-24 DIAGNOSIS — N2581 Secondary hyperparathyroidism of renal origin: Secondary | ICD-10-CM | POA: Diagnosis not present

## 2017-12-24 DIAGNOSIS — N186 End stage renal disease: Secondary | ICD-10-CM | POA: Diagnosis not present

## 2017-12-24 DIAGNOSIS — D509 Iron deficiency anemia, unspecified: Secondary | ICD-10-CM | POA: Diagnosis not present

## 2017-12-24 DIAGNOSIS — D631 Anemia in chronic kidney disease: Secondary | ICD-10-CM | POA: Diagnosis not present

## 2017-12-26 DIAGNOSIS — N2581 Secondary hyperparathyroidism of renal origin: Secondary | ICD-10-CM | POA: Diagnosis not present

## 2017-12-26 DIAGNOSIS — Z992 Dependence on renal dialysis: Secondary | ICD-10-CM | POA: Diagnosis not present

## 2017-12-26 DIAGNOSIS — D509 Iron deficiency anemia, unspecified: Secondary | ICD-10-CM | POA: Diagnosis not present

## 2017-12-26 DIAGNOSIS — D631 Anemia in chronic kidney disease: Secondary | ICD-10-CM | POA: Diagnosis not present

## 2017-12-26 DIAGNOSIS — N186 End stage renal disease: Secondary | ICD-10-CM | POA: Diagnosis not present

## 2017-12-28 ENCOUNTER — Encounter: Payer: Self-pay | Admitting: Cardiology

## 2017-12-28 DIAGNOSIS — N2581 Secondary hyperparathyroidism of renal origin: Secondary | ICD-10-CM | POA: Diagnosis not present

## 2017-12-28 DIAGNOSIS — D631 Anemia in chronic kidney disease: Secondary | ICD-10-CM | POA: Diagnosis not present

## 2017-12-28 DIAGNOSIS — N186 End stage renal disease: Secondary | ICD-10-CM | POA: Diagnosis not present

## 2017-12-28 DIAGNOSIS — D509 Iron deficiency anemia, unspecified: Secondary | ICD-10-CM | POA: Diagnosis not present

## 2017-12-28 DIAGNOSIS — Z992 Dependence on renal dialysis: Secondary | ICD-10-CM | POA: Diagnosis not present

## 2017-12-31 DIAGNOSIS — N2581 Secondary hyperparathyroidism of renal origin: Secondary | ICD-10-CM | POA: Diagnosis not present

## 2017-12-31 DIAGNOSIS — D631 Anemia in chronic kidney disease: Secondary | ICD-10-CM | POA: Diagnosis not present

## 2017-12-31 DIAGNOSIS — D509 Iron deficiency anemia, unspecified: Secondary | ICD-10-CM | POA: Diagnosis not present

## 2017-12-31 DIAGNOSIS — N186 End stage renal disease: Secondary | ICD-10-CM | POA: Diagnosis not present

## 2017-12-31 DIAGNOSIS — Z992 Dependence on renal dialysis: Secondary | ICD-10-CM | POA: Diagnosis not present

## 2018-01-02 DIAGNOSIS — N2581 Secondary hyperparathyroidism of renal origin: Secondary | ICD-10-CM | POA: Diagnosis not present

## 2018-01-02 DIAGNOSIS — N186 End stage renal disease: Secondary | ICD-10-CM | POA: Diagnosis not present

## 2018-01-02 DIAGNOSIS — D631 Anemia in chronic kidney disease: Secondary | ICD-10-CM | POA: Diagnosis not present

## 2018-01-02 DIAGNOSIS — D509 Iron deficiency anemia, unspecified: Secondary | ICD-10-CM | POA: Diagnosis not present

## 2018-01-02 DIAGNOSIS — Z992 Dependence on renal dialysis: Secondary | ICD-10-CM | POA: Diagnosis not present

## 2018-01-04 DIAGNOSIS — Z992 Dependence on renal dialysis: Secondary | ICD-10-CM | POA: Diagnosis not present

## 2018-01-04 DIAGNOSIS — N2581 Secondary hyperparathyroidism of renal origin: Secondary | ICD-10-CM | POA: Diagnosis not present

## 2018-01-04 DIAGNOSIS — N186 End stage renal disease: Secondary | ICD-10-CM | POA: Diagnosis not present

## 2018-01-04 DIAGNOSIS — D509 Iron deficiency anemia, unspecified: Secondary | ICD-10-CM | POA: Diagnosis not present

## 2018-01-04 DIAGNOSIS — D631 Anemia in chronic kidney disease: Secondary | ICD-10-CM | POA: Diagnosis not present

## 2018-01-05 DIAGNOSIS — N186 End stage renal disease: Secondary | ICD-10-CM | POA: Diagnosis not present

## 2018-01-05 DIAGNOSIS — Z992 Dependence on renal dialysis: Secondary | ICD-10-CM | POA: Diagnosis not present

## 2018-01-06 ENCOUNTER — Other Ambulatory Visit (HOSPITAL_COMMUNITY): Payer: Self-pay | Admitting: Psychiatry

## 2018-01-07 DIAGNOSIS — Z992 Dependence on renal dialysis: Secondary | ICD-10-CM | POA: Diagnosis not present

## 2018-01-07 DIAGNOSIS — D631 Anemia in chronic kidney disease: Secondary | ICD-10-CM | POA: Diagnosis not present

## 2018-01-07 DIAGNOSIS — D509 Iron deficiency anemia, unspecified: Secondary | ICD-10-CM | POA: Diagnosis not present

## 2018-01-07 DIAGNOSIS — N186 End stage renal disease: Secondary | ICD-10-CM | POA: Diagnosis not present

## 2018-01-09 DIAGNOSIS — D509 Iron deficiency anemia, unspecified: Secondary | ICD-10-CM | POA: Diagnosis not present

## 2018-01-09 DIAGNOSIS — N186 End stage renal disease: Secondary | ICD-10-CM | POA: Diagnosis not present

## 2018-01-09 DIAGNOSIS — Z992 Dependence on renal dialysis: Secondary | ICD-10-CM | POA: Diagnosis not present

## 2018-01-09 DIAGNOSIS — D631 Anemia in chronic kidney disease: Secondary | ICD-10-CM | POA: Diagnosis not present

## 2018-01-11 DIAGNOSIS — N186 End stage renal disease: Secondary | ICD-10-CM | POA: Diagnosis not present

## 2018-01-11 DIAGNOSIS — D631 Anemia in chronic kidney disease: Secondary | ICD-10-CM | POA: Diagnosis not present

## 2018-01-11 DIAGNOSIS — D509 Iron deficiency anemia, unspecified: Secondary | ICD-10-CM | POA: Diagnosis not present

## 2018-01-11 DIAGNOSIS — Z992 Dependence on renal dialysis: Secondary | ICD-10-CM | POA: Diagnosis not present

## 2018-01-14 DIAGNOSIS — Z992 Dependence on renal dialysis: Secondary | ICD-10-CM | POA: Diagnosis not present

## 2018-01-14 DIAGNOSIS — D631 Anemia in chronic kidney disease: Secondary | ICD-10-CM | POA: Diagnosis not present

## 2018-01-14 DIAGNOSIS — N186 End stage renal disease: Secondary | ICD-10-CM | POA: Diagnosis not present

## 2018-01-14 DIAGNOSIS — D509 Iron deficiency anemia, unspecified: Secondary | ICD-10-CM | POA: Diagnosis not present

## 2018-01-16 DIAGNOSIS — D631 Anemia in chronic kidney disease: Secondary | ICD-10-CM | POA: Diagnosis not present

## 2018-01-16 DIAGNOSIS — D509 Iron deficiency anemia, unspecified: Secondary | ICD-10-CM | POA: Diagnosis not present

## 2018-01-16 DIAGNOSIS — Z992 Dependence on renal dialysis: Secondary | ICD-10-CM | POA: Diagnosis not present

## 2018-01-16 DIAGNOSIS — N186 End stage renal disease: Secondary | ICD-10-CM | POA: Diagnosis not present

## 2018-01-17 ENCOUNTER — Telehealth: Payer: Self-pay

## 2018-01-17 ENCOUNTER — Other Ambulatory Visit: Payer: Self-pay | Admitting: Cardiology

## 2018-01-17 ENCOUNTER — Other Ambulatory Visit: Payer: Self-pay | Admitting: Family Medicine

## 2018-01-17 DIAGNOSIS — Z79891 Long term (current) use of opiate analgesic: Secondary | ICD-10-CM | POA: Diagnosis not present

## 2018-01-17 DIAGNOSIS — M25569 Pain in unspecified knee: Secondary | ICD-10-CM | POA: Diagnosis not present

## 2018-01-17 DIAGNOSIS — M545 Low back pain: Secondary | ICD-10-CM | POA: Diagnosis not present

## 2018-01-17 DIAGNOSIS — M130 Polyarthritis, unspecified: Secondary | ICD-10-CM | POA: Diagnosis not present

## 2018-01-17 NOTE — Telephone Encounter (Signed)
Pharmacy contacted office stating patient is going through 25 nitroglycerin every 8 days. Pharmacy would like to know if this is ok to refill again?

## 2018-01-18 DIAGNOSIS — D509 Iron deficiency anemia, unspecified: Secondary | ICD-10-CM | POA: Diagnosis not present

## 2018-01-18 DIAGNOSIS — Z992 Dependence on renal dialysis: Secondary | ICD-10-CM | POA: Diagnosis not present

## 2018-01-18 DIAGNOSIS — D631 Anemia in chronic kidney disease: Secondary | ICD-10-CM | POA: Diagnosis not present

## 2018-01-18 DIAGNOSIS — N186 End stage renal disease: Secondary | ICD-10-CM | POA: Diagnosis not present

## 2018-01-20 ENCOUNTER — Telehealth: Payer: Self-pay | Admitting: *Deleted

## 2018-01-20 MED ORDER — NITROGLYCERIN 0.4 MG SL SUBL: 0.4000 mg | SUBLINGUAL_TABLET | SUBLINGUAL | 3 refills | Status: DC | PRN

## 2018-01-20 NOTE — Telephone Encounter (Signed)
Patient requesting refill on Nitroglycerin.  Stated that you guys had discussed her usage in the past.  She is going through 75 tabs per month.  Will fwd to provider to okay refill.

## 2018-01-20 NOTE — Telephone Encounter (Signed)
Ok to refill her NG for the 75 a month   Zandra Abts MD

## 2018-01-20 NOTE — Telephone Encounter (Signed)
Patient notified and verbalized understanding.  Refill sent to [place] now.

## 2018-01-21 DIAGNOSIS — D631 Anemia in chronic kidney disease: Secondary | ICD-10-CM | POA: Diagnosis not present

## 2018-01-21 DIAGNOSIS — Z992 Dependence on renal dialysis: Secondary | ICD-10-CM | POA: Diagnosis not present

## 2018-01-21 DIAGNOSIS — N186 End stage renal disease: Secondary | ICD-10-CM | POA: Diagnosis not present

## 2018-01-21 DIAGNOSIS — D509 Iron deficiency anemia, unspecified: Secondary | ICD-10-CM | POA: Diagnosis not present

## 2018-01-23 DIAGNOSIS — D631 Anemia in chronic kidney disease: Secondary | ICD-10-CM | POA: Diagnosis not present

## 2018-01-23 DIAGNOSIS — N186 End stage renal disease: Secondary | ICD-10-CM | POA: Diagnosis not present

## 2018-01-23 DIAGNOSIS — Z992 Dependence on renal dialysis: Secondary | ICD-10-CM | POA: Diagnosis not present

## 2018-01-23 DIAGNOSIS — D509 Iron deficiency anemia, unspecified: Secondary | ICD-10-CM | POA: Diagnosis not present

## 2018-01-25 DIAGNOSIS — Z992 Dependence on renal dialysis: Secondary | ICD-10-CM | POA: Diagnosis not present

## 2018-01-25 DIAGNOSIS — N186 End stage renal disease: Secondary | ICD-10-CM | POA: Diagnosis not present

## 2018-01-25 DIAGNOSIS — D631 Anemia in chronic kidney disease: Secondary | ICD-10-CM | POA: Diagnosis not present

## 2018-01-25 DIAGNOSIS — D509 Iron deficiency anemia, unspecified: Secondary | ICD-10-CM | POA: Diagnosis not present

## 2018-01-28 DIAGNOSIS — N186 End stage renal disease: Secondary | ICD-10-CM | POA: Diagnosis not present

## 2018-01-28 DIAGNOSIS — D631 Anemia in chronic kidney disease: Secondary | ICD-10-CM | POA: Diagnosis not present

## 2018-01-28 DIAGNOSIS — D509 Iron deficiency anemia, unspecified: Secondary | ICD-10-CM | POA: Diagnosis not present

## 2018-01-28 DIAGNOSIS — Z992 Dependence on renal dialysis: Secondary | ICD-10-CM | POA: Diagnosis not present

## 2018-01-30 DIAGNOSIS — Z992 Dependence on renal dialysis: Secondary | ICD-10-CM | POA: Diagnosis not present

## 2018-01-30 DIAGNOSIS — D631 Anemia in chronic kidney disease: Secondary | ICD-10-CM | POA: Diagnosis not present

## 2018-01-30 DIAGNOSIS — D509 Iron deficiency anemia, unspecified: Secondary | ICD-10-CM | POA: Diagnosis not present

## 2018-01-30 DIAGNOSIS — N186 End stage renal disease: Secondary | ICD-10-CM | POA: Diagnosis not present

## 2018-02-01 DIAGNOSIS — D509 Iron deficiency anemia, unspecified: Secondary | ICD-10-CM | POA: Diagnosis not present

## 2018-02-01 DIAGNOSIS — Z992 Dependence on renal dialysis: Secondary | ICD-10-CM | POA: Diagnosis not present

## 2018-02-01 DIAGNOSIS — N186 End stage renal disease: Secondary | ICD-10-CM | POA: Diagnosis not present

## 2018-02-01 DIAGNOSIS — D631 Anemia in chronic kidney disease: Secondary | ICD-10-CM | POA: Diagnosis not present

## 2018-02-03 ENCOUNTER — Ambulatory Visit (INDEPENDENT_AMBULATORY_CARE_PROVIDER_SITE_OTHER): Payer: Medicare Other | Admitting: Family Medicine

## 2018-02-03 ENCOUNTER — Encounter: Payer: Self-pay | Admitting: Family Medicine

## 2018-02-03 VITALS — BP 128/61 | HR 55 | Temp 97.9°F | Ht <= 58 in | Wt 167.0 lb

## 2018-02-03 DIAGNOSIS — I120 Hypertensive chronic kidney disease with stage 5 chronic kidney disease or end stage renal disease: Secondary | ICD-10-CM

## 2018-02-03 DIAGNOSIS — E039 Hypothyroidism, unspecified: Secondary | ICD-10-CM

## 2018-02-03 DIAGNOSIS — J449 Chronic obstructive pulmonary disease, unspecified: Secondary | ICD-10-CM | POA: Diagnosis not present

## 2018-02-03 DIAGNOSIS — N186 End stage renal disease: Secondary | ICD-10-CM | POA: Diagnosis not present

## 2018-02-03 MED ORDER — ALBUTEROL SULFATE HFA 108 (90 BASE) MCG/ACT IN AERS: 2.0000 | INHALATION_SPRAY | Freq: Four times a day (QID) | RESPIRATORY_TRACT | 2 refills | Status: DC | PRN

## 2018-02-03 MED ORDER — BUDESONIDE-FORMOTEROL FUMARATE 160-4.5 MCG/ACT IN AERO: 2.0000 | INHALATION_SPRAY | Freq: Two times a day (BID) | RESPIRATORY_TRACT | 0 refills | Status: DC

## 2018-02-03 NOTE — Patient Instructions (Signed)
Because you are using your albuterol inhaler so much, we have started you on Symbicort.  Use 2 puffs twice a day every day.  If you have shortness of breath or wheeze despite the inhaler, you may use the albuterol inhaler.  We want to limit the amount of albuterol you are using.  Follow-up with me in 3 months for recheck or sooner if needed.  If you find that Symbicort is helpful, call me and I will send in the prescription.   Chronic Obstructive Pulmonary Disease Chronic obstructive pulmonary disease (COPD) is a long-term (chronic) lung problem. When you have COPD, it is hard for air to get in and out of your lungs. The way your lungs work will never return to normal. Usually the condition gets worse over time. There are things you can do to keep yourself as healthy as possible. Your doctor may treat your condition with:  Medicines.  Quitting smoking, if you smoke.  Rehabilitation. This may involve a team of specialists.  Oxygen.  Exercise and changes to your diet.  Lung surgery.  Comfort measures (palliative care).  Follow these instructions at home: Medicines  Take over-the-counter and prescription medicines only as told by your doctor.  Talk to your doctor before taking any cough or allergy medicines. You may need to avoid medicines that cause your lungs to be dry. Lifestyle  If you smoke, stop. Smoking makes the problem worse. If you need help quitting, ask your doctor.  Avoid being around things that make your breathing worse. This may include smoke, chemicals, and fumes.  Stay active, but remember to also rest.  Learn and use tips on how to relax.  Make sure you get enough sleep. Most adults need at least 7 hours a night.  Eat healthy foods. Eat smaller meals more often. Rest before meals. Controlled breathing  Learn and use tips on how to control your breathing as told by your doctor. Try: ? Breathing in (inhaling) through your nose for 1 second. Then, pucker your  lips and breath out (exhale) through your lips for 2 seconds. ? Putting one hand on your belly (abdomen). Breathe in slowly through your nose for 1 second. Your hand on your belly should move out. Pucker your lips and breathe out slowly through your lips. Your hand on your belly should move in as you breathe out. Controlled coughing  Learn and use controlled coughing to clear mucus from your lungs. The steps are: 1. Lean your head a little forward. 2. Breathe in deeply. 3. Try to hold your breath for 3 seconds. 4. Keep your mouth slightly open while coughing 2 times. 5. Spit any mucus out into a tissue. 6. Rest and do the steps again 1 or 2 times as needed. General instructions  Make sure you get all the shots (vaccines) that your doctor recommends. Ask your doctor about a flu shot and a pneumonia shot.  Use oxygen therapy and therapy to help improve your lungs (pulmonary rehabilitation) if told by your doctor. If you need home oxygen therapy, ask your doctor if you should buy a tool to measure your oxygen level (oximeter).  Make a COPD action plan with your doctor. This helps you know what to do if you feel worse than usual.  Manage any other conditions you have as told by your doctor.  Avoid going outside when it is very hot, cold, or humid.  Avoid people who have a sickness you can catch (contagious).  Keep all follow-up visits  as told by your doctor. This is important. Contact a doctor if:  You cough up more mucus than usual.  There is a change in the color or thickness of the mucus.  It is harder to breathe than usual.  Your breathing is faster than usual.  You have trouble sleeping.  You need to use your medicines more often than usual.  You have trouble doing your normal activities such as getting dressed or walking around the house. Get help right away if:  You have shortness of breath while resting.  You have shortness of breath that stops you from: ? Being able  to talk. ? Doing normal activities.  Your chest hurts for longer than 5 minutes.  Your skin color is more blue than usual.  Your pulse oximeter shows that you have low oxygen for longer than 5 minutes.  You have a fever.  You feel too tired to breathe normally. Summary  Chronic obstructive pulmonary disease (COPD) is a long-term lung problem.  The way your lungs work will never return to normal. Usually the condition gets worse over time. There are things you can do to keep yourself as healthy as possible.  Take over-the-counter and prescription medicines only as told by your doctor.  If you smoke, stop. Smoking makes the problem worse. This information is not intended to replace advice given to you by your health care provider. Make sure you discuss any questions you have with your health care provider. Document Released: 12/12/2007 Document Revised: 12/01/2015 Document Reviewed: 02/19/2013 Elsevier Interactive Patient Education  2017 [employer].

## 2018-02-03 NOTE — Progress Notes (Signed)
Subjective: CC: HTN PCP: Janora Norlander, DO FOY:DXAJOIN Sherry Cox is a 57 y.o. female presenting to clinic today for:  1. Hypertension/ ESRD on HD Patient reports Blood pressure at home: SBPs 120-160s; Meds: Compliant with Norvasc, Metoprolol, Nitroglycerin, Side effects: none.  She reports that she takes lower doses of her medication on hemodialysis days.  She takes a higher dose on non-hemodialysis days.  ROS: Denies headache, dizziness, visual changes, nausea, vomiting, chest pain, LE swelling, abdominal pain.  She does have some shortness of breath as below  2.  COPD Patient reports that she has been having some coughing spells and shortness of breath.  She is been using her albuterol inhaler quite a bit lately and this seems to help.  Denies any fevers.  She had a cold recently that caused a productive cough but that is since resolved.  She is not currently on any controller medications.  3.  Hypothyroidism She reports good compliance with Synthroid.  No constipation, diarrhea.  No changes in weight.   ROS: Per HPI  Allergies  Allergen Reactions  . Augmentin [Amoxicillin-Pot Clavulanate] Nausea And Vomiting    Vomiting   Past Medical History:  Diagnosis Date  . Ankle injury   . Anxiety   . Anxiety disorder   . CAD (coronary artery disease)    Total RCA, stress echo [facility] 11/2008-no ischemia, EF 55%; h/o CABG  . Carotid artery stenosis    12/10/2008 doppler 86-76% RICA, 7-20% LICA/see evaluation by Dr. Oneida Alar 2011  . Ejection fraction    EF 60-65%, echo, June, 2009  . ESRD on dialysis [facility])    Transplant consideration  . Hemochromatosis   . Hx of CABG    Off pump LIMA to LAD  05/2006  . Hx of tobacco use, presenting hazards to health 04/26/2012  . Hyperlipidemia   . Irregular heart beat   . Murmur    October, 2012  . Neuropathy   . Overweight(278.02)   . Tobacco abuse     Current Outpatient Medications:  .  amLODipine (NORVASC) 10 MG tablet, Take 1 tablet  (10 mg total) by mouth daily. (Patient taking differently: Take 10 mg by mouth at bedtime. ), Disp: 90 tablet, Rfl: 3 .  Artificial Tear Ointment (DRY EYES OP), Place 1 drop into both eyes daily., Disp: , Rfl:  .  aspirin EC 81 MG tablet, Take 81 mg by mouth 2 (two) times daily., Disp: , Rfl:  .  atorvastatin (LIPITOR) 80 MG tablet, TAKE ONE TABLET BY MOUTH DAILY, Disp: 90 tablet, Rfl: 1 .  clonazePAM (KLONOPIN) 0.5 MG tablet, Take 1 tablet (0.5 mg total) by mouth 4 (four) times daily., Disp: 120 tablet, Rfl: 2 .  DULoxetine (CYMBALTA) 60 MG capsule, Take 1 capsule (60 mg total) by mouth daily., Disp: 30 capsule, Rfl: 2 .  fluticasone (FLONASE) 50 MCG/ACT nasal spray, USE 2 SPRAYS IN EACH NOSTRIL ONCE DAILY.  SHAKE GENTLY BEFORE USING., Disp: 16 g, Rfl: 6 .  folic acid (FOLVITE) 1 MG tablet, Take 1 tablet (1 mg total) by mouth daily., Disp: 90 tablet, Rfl: 4 .  folic acid-vitamin b complex-vitamin c-selenium-zinc (DIALYVITE) 3 MG TABS, Take 1 tablet by mouth daily.  , Disp: , Rfl:  .  HYDROcodone-acetaminophen (NORCO) 5-325 MG tablet, Take 1 tablet by mouth 2 (two) times daily as needed for moderate pain., Disp: 90 tablet, Rfl: 0 .  ibuprofen (ADVIL,MOTRIN) 200 MG tablet, Take 400 mg by mouth every 6 (six) hours as needed for  mild pain., Disp: , Rfl:  .  levothyroxine (SYNTHROID, LEVOTHROID) 175 MCG tablet, TAKE ONE TABLET BY MOUTH DAILY BEFORE BREAKFAST., Disp: 90 tablet, Rfl: 1 .  metoprolol tartrate (LOPRESSOR) 50 MG tablet, TAKE ONE TABLET BY MOUTH TWICE DAILY ON MONDAY, WEDNESDAY AND FRIDAY. TAKE ONE TABLET ON REMAINING DAYS., Disp: 70 tablet, Rfl: 1 .  NARCAN 4 MG/0.1ML LIQD nasal spray kit, Place 1 spray into the nose once as needed. overdose, Disp: , Rfl: 0 .  nitroGLYCERIN (NITROSTAT) 0.4 MG SL tablet, Place 1 tablet (0.4 mg total) under the tongue every 5 (five) minutes as needed for chest pain., Disp: 75 tablet, Rfl: 3 .  OXYGEN, Inhale into the lungs. On 2, Disp: , Rfl:  .  sertraline  (ZOLOFT) 100 MG tablet, Take 1 tablet (100 mg total) by mouth daily., Disp: 90 tablet, Rfl: 3 .  sevelamer (RENVELA) 800 MG tablet, Take 1,600-4,000 mg by mouth as directed. Take 5 tablets (4035m) by mouth with meals and 1600 tablets by mouth with snacks., Disp: , Rfl:  .  sodium bicarbonate 650 MG tablet, Take 650 mg by mouth 2 (two) times daily. , Disp: , Rfl:  .  sodium chloride (OCEAN) 0.65 % SOLN nasal spray, Place 1 spray into both nostrils as needed for congestion., Disp: , Rfl:  .  traMADol (ULTRAM) 50 MG tablet, take one at bedtime, Disp: , Rfl: 0 .  VENTOLIN HFA 108 (90 Base) MCG/ACT inhaler, INHALE TWO PUFFS INTO THE LUNGS EVERY 4 HOURS AS NEEDED FOR WHEEZING., Disp: 18 g, Rfl: 2 Social History   Socioeconomic History  . Marital status: Married    Spouse name: Not on file  . Number of children: Not on file  . Years of education: Not on file  . Highest education level: Not on file  Occupational History  . Occupation: DISABLED  Social Needs  . Financial resource strain: Not on file  . Food insecurity:    Worry: Not on file    Inability: Not on file  . Transportation needs:    Medical: Not on file    Non-medical: Not on file  Tobacco Use  . Smoking status: Current Every Day Smoker    Packs/day: 0.10    Years: 30.00    Pack years: 3.00    Types: Cigarettes    Start date: 02/02/1972  . Smokeless tobacco: Never Used  Substance and Sexual Activity  . Alcohol use: No    Alcohol/week: 0.0 oz  . Drug use: No  . Sexual activity: Yes  Lifestyle  . Physical activity:    Days per week: Not on file    Minutes per session: Not on file  . Stress: Not on file  Relationships  . Social connections:    Talks on phone: Not on file    Gets together: Not on file    Attends religious service: Not on file    Active member of club or organization: Not on file    Attends meetings of clubs or organizations: Not on file    Relationship status: Not on file  . Intimate partner violence:      Fear of current or ex partner: Not on file    Emotionally abused: Not on file    Physically abused: Not on file    Forced sexual activity: Not on file  Other Topics Concern  . Not on file  Social History Narrative  . Not on file   Family History  Problem Relation Age of Onset  .  Depression Sister   . Dementia Sister   . OCD Sister   . Diabetes Sister   . Hypertension Sister   . Heart attack Sister   . Bipolar disorder Brother   . Drug abuse Brother   . Diabetes Brother   . Hypertension Brother   . Heart disease Brother   . Hemochromatosis Brother   . Bipolar disorder Other   . ADD / ADHD Other   . Seizures Other   . Bipolar disorder Other   . Alcohol abuse Other   . Dementia Mother   . Heart disease Mother   . Hypertension Mother   . Heart attack Mother   . Alcohol abuse Father   . Heart disease Father        Heart Disease before age 44  . Hypertension Father   . Heart attack Father   . Dementia Maternal Aunt   . OCD Sister   . Heart disease Sister        Heart Disease before age 58  . Diabetes Sister   . Hemochromatosis Sister   . Hypertension Sister   . Rheum arthritis Sister   . Heart attack Brother   . COPD Brother   . Heart attack Brother   . Paranoid behavior Neg Hx   . Schizophrenia Neg Hx   . Sexual abuse Neg Hx   . Physical abuse Neg Hx     Objective: Office vital signs reviewed. BP 128/61   Pulse (!) 55   Temp 97.9 F (36.6 C) (Oral)   Ht _0  (1.473 m)   Wt 167 lb (75.8 kg)   BMI 34.90 kg/m   Physical Examination:  General: Awake, alert, well nourished, No acute distress HEENT: Normal, MMM, no exophthalmos.  No goiter. Cardio: bradycardic w/ regular rhythm, S1S2 heard, 2/6 SEM  Pulm: clear to auscultation bilaterally, no wheezes, rhonchi or rales; normal work of breathing on room air Extremities: warm, well perfused, No edema, cyanosis or clubbing  Assessment/ Plan: 57 y.o. female   1. Hypothyroidism, unspecified  type Asymptomatic with current dose of Synthroid.  Will check TSH today. - TSH  2. Benign hypertension with ESRD (end-stage renal disease) ([place]) Controlled on today's exam.  Continue current regimen.  No refills needed today.  3. Chronic obstructive pulmonary disease, unspecified COPD type ([place]) She is afebrile and nontoxic-appearing.  I do not think that she is in a COPD exacerbation but rather has uncontrolled COPD.  Possibly related to pollen.  Given increased use of albuterol inhaler, we discussed use of a controller medication.  She was given a sample of Symbicort 160 to use 2 puffs twice daily.  She was able to demonstrate proper use technique here in office.  I encouraged her to wash her mouth out with each use.  Would like to reduce use of albuterol if possible.  She will contact me in the next couple of weeks to let me know how the Symbicort is going.  If she is doing well on this medication, I will send in refills.  She will follow-up with me in the next 3 months for recheck.   Orders Placed This Encounter  Procedures  . TSH   Meds ordered this encounter  Medications  . budesonide-formoterol (SYMBICORT) 160-4.5 MCG/ACT inhaler    Sig: Inhale 2 puffs into the lungs 2 (two) times daily.    Dispense:  1 Inhaler    Refill:  0  . albuterol (VENTOLIN HFA) 108 (90 Base) MCG/ACT inhaler  Sig: Inhale 2 puffs into the lungs every 6 (six) hours as needed for wheezing or shortness of breath.    Dispense:  1 Inhaler    Refill:  [place], [place] [phone]

## 2018-02-04 DIAGNOSIS — Z992 Dependence on renal dialysis: Secondary | ICD-10-CM | POA: Diagnosis not present

## 2018-02-04 DIAGNOSIS — D631 Anemia in chronic kidney disease: Secondary | ICD-10-CM | POA: Diagnosis not present

## 2018-02-04 DIAGNOSIS — D509 Iron deficiency anemia, unspecified: Secondary | ICD-10-CM | POA: Diagnosis not present

## 2018-02-04 DIAGNOSIS — N186 End stage renal disease: Secondary | ICD-10-CM | POA: Diagnosis not present

## 2018-02-04 LAB — TSH: TSH: 0.182 u[IU]/mL — ABNORMAL LOW (ref 0.450–4.500)

## 2018-02-05 ENCOUNTER — Ambulatory Visit (INDEPENDENT_AMBULATORY_CARE_PROVIDER_SITE_OTHER): Payer: Medicare Other | Admitting: Psychiatry

## 2018-02-05 ENCOUNTER — Encounter (HOSPITAL_COMMUNITY): Payer: Self-pay | Admitting: Psychiatry

## 2018-02-05 DIAGNOSIS — N186 End stage renal disease: Secondary | ICD-10-CM | POA: Diagnosis not present

## 2018-02-05 DIAGNOSIS — F331 Major depressive disorder, recurrent, moderate: Secondary | ICD-10-CM

## 2018-02-05 DIAGNOSIS — I6523 Occlusion and stenosis of bilateral carotid arteries: Secondary | ICD-10-CM | POA: Diagnosis not present

## 2018-02-05 DIAGNOSIS — Z992 Dependence on renal dialysis: Secondary | ICD-10-CM | POA: Diagnosis not present

## 2018-02-05 MED ORDER — DULOXETINE HCL 60 MG PO CPEP: 60.0000 mg | ORAL_CAPSULE | Freq: Every day | ORAL | 2 refills | Status: DC

## 2018-02-05 MED ORDER — CLONAZEPAM 0.5 MG PO TABS: 0.5000 mg | ORAL_TABLET | Freq: Four times a day (QID) | ORAL | 2 refills | Status: DC

## 2018-02-05 MED ORDER — SERTRALINE HCL 100 MG PO TABS: 100.0000 mg | ORAL_TABLET | Freq: Every day | ORAL | 3 refills | Status: DC
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
## 2018-02-06 DIAGNOSIS — D631 Anemia in chronic kidney disease: Secondary | ICD-10-CM | POA: Diagnosis not present

## 2018-02-06 DIAGNOSIS — Z992 Dependence on renal dialysis: Secondary | ICD-10-CM | POA: Diagnosis not present

## 2018-02-06 DIAGNOSIS — D509 Iron deficiency anemia, unspecified: Secondary | ICD-10-CM | POA: Diagnosis not present

## 2018-02-06 DIAGNOSIS — N186 End stage renal disease: Secondary | ICD-10-CM | POA: Diagnosis not present

## 2018-02-07 ENCOUNTER — Other Ambulatory Visit: Payer: Self-pay | Admitting: Cardiology

## 2018-02-08 DIAGNOSIS — D509 Iron deficiency anemia, unspecified: Secondary | ICD-10-CM | POA: Diagnosis not present

## 2018-02-08 DIAGNOSIS — D631 Anemia in chronic kidney disease: Secondary | ICD-10-CM | POA: Diagnosis not present

## 2018-02-08 DIAGNOSIS — Z992 Dependence on renal dialysis: Secondary | ICD-10-CM | POA: Diagnosis not present

## 2018-02-08 DIAGNOSIS — N186 End stage renal disease: Secondary | ICD-10-CM | POA: Diagnosis not present

## 2018-02-11 DIAGNOSIS — D631 Anemia in chronic kidney disease: Secondary | ICD-10-CM | POA: Diagnosis not present

## 2018-02-11 DIAGNOSIS — D509 Iron deficiency anemia, unspecified: Secondary | ICD-10-CM | POA: Diagnosis not present

## 2018-02-11 DIAGNOSIS — Z992 Dependence on renal dialysis: Secondary | ICD-10-CM | POA: Diagnosis not present

## 2018-02-11 DIAGNOSIS — N186 End stage renal disease: Secondary | ICD-10-CM | POA: Diagnosis not present

## 2018-02-12 DIAGNOSIS — D509 Iron deficiency anemia, unspecified: Secondary | ICD-10-CM | POA: Diagnosis not present

## 2018-02-12 DIAGNOSIS — Z992 Dependence on renal dialysis: Secondary | ICD-10-CM | POA: Diagnosis not present

## 2018-02-12 DIAGNOSIS — D631 Anemia in chronic kidney disease: Secondary | ICD-10-CM | POA: Diagnosis not present

## 2018-02-12 DIAGNOSIS — N186 End stage renal disease: Secondary | ICD-10-CM | POA: Diagnosis not present

## 2018-02-14 ENCOUNTER — Ambulatory Visit (HOSPITAL_COMMUNITY): Payer: Self-pay | Admitting: Psychiatry

## 2018-02-14 DIAGNOSIS — D631 Anemia in chronic kidney disease: Secondary | ICD-10-CM | POA: Diagnosis not present

## 2018-02-14 DIAGNOSIS — N186 End stage renal disease: Secondary | ICD-10-CM | POA: Diagnosis not present

## 2018-02-14 DIAGNOSIS — Z992 Dependence on renal dialysis: Secondary | ICD-10-CM | POA: Diagnosis not present

## 2018-02-18 DIAGNOSIS — D631 Anemia in chronic kidney disease: Secondary | ICD-10-CM | POA: Diagnosis not present

## 2018-02-18 DIAGNOSIS — D509 Iron deficiency anemia, unspecified: Secondary | ICD-10-CM | POA: Diagnosis not present

## 2018-02-18 DIAGNOSIS — N186 End stage renal disease: Secondary | ICD-10-CM | POA: Diagnosis not present

## 2018-02-18 DIAGNOSIS — Z992 Dependence on renal dialysis: Secondary | ICD-10-CM | POA: Diagnosis not present

## 2018-02-20 DIAGNOSIS — N186 End stage renal disease: Secondary | ICD-10-CM | POA: Diagnosis not present

## 2018-02-20 DIAGNOSIS — D631 Anemia in chronic kidney disease: Secondary | ICD-10-CM | POA: Diagnosis not present

## 2018-02-20 DIAGNOSIS — D509 Iron deficiency anemia, unspecified: Secondary | ICD-10-CM | POA: Diagnosis not present

## 2018-02-20 DIAGNOSIS — Z992 Dependence on renal dialysis: Secondary | ICD-10-CM | POA: Diagnosis not present

## 2018-02-22 DIAGNOSIS — Z992 Dependence on renal dialysis: Secondary | ICD-10-CM | POA: Diagnosis not present

## 2018-02-22 DIAGNOSIS — D509 Iron deficiency anemia, unspecified: Secondary | ICD-10-CM | POA: Diagnosis not present

## 2018-02-22 DIAGNOSIS — N186 End stage renal disease: Secondary | ICD-10-CM | POA: Diagnosis not present

## 2018-02-22 DIAGNOSIS — D631 Anemia in chronic kidney disease: Secondary | ICD-10-CM | POA: Diagnosis not present

## 2018-02-25 DIAGNOSIS — N186 End stage renal disease: Secondary | ICD-10-CM | POA: Diagnosis not present

## 2018-02-25 DIAGNOSIS — Z992 Dependence on renal dialysis: Secondary | ICD-10-CM | POA: Diagnosis not present

## 2018-02-25 DIAGNOSIS — D631 Anemia in chronic kidney disease: Secondary | ICD-10-CM | POA: Diagnosis not present

## 2018-02-25 DIAGNOSIS — D509 Iron deficiency anemia, unspecified: Secondary | ICD-10-CM | POA: Diagnosis not present

## 2018-02-27 DIAGNOSIS — D509 Iron deficiency anemia, unspecified: Secondary | ICD-10-CM | POA: Diagnosis not present

## 2018-02-27 DIAGNOSIS — N186 End stage renal disease: Secondary | ICD-10-CM | POA: Diagnosis not present

## 2018-02-27 DIAGNOSIS — D631 Anemia in chronic kidney disease: Secondary | ICD-10-CM | POA: Diagnosis not present

## 2018-02-27 DIAGNOSIS — Z992 Dependence on renal dialysis: Secondary | ICD-10-CM | POA: Diagnosis not present

## 2018-03-01 DIAGNOSIS — D631 Anemia in chronic kidney disease: Secondary | ICD-10-CM | POA: Diagnosis not present

## 2018-03-01 DIAGNOSIS — Z992 Dependence on renal dialysis: Secondary | ICD-10-CM | POA: Diagnosis not present

## 2018-03-01 DIAGNOSIS — N186 End stage renal disease: Secondary | ICD-10-CM | POA: Diagnosis not present

## 2018-03-01 DIAGNOSIS — D509 Iron deficiency anemia, unspecified: Secondary | ICD-10-CM | POA: Diagnosis not present

## 2018-03-04 DIAGNOSIS — D509 Iron deficiency anemia, unspecified: Secondary | ICD-10-CM | POA: Diagnosis not present

## 2018-03-04 DIAGNOSIS — Z992 Dependence on renal dialysis: Secondary | ICD-10-CM | POA: Diagnosis not present

## 2018-03-04 DIAGNOSIS — D631 Anemia in chronic kidney disease: Secondary | ICD-10-CM | POA: Diagnosis not present

## 2018-03-04 DIAGNOSIS — N186 End stage renal disease: Secondary | ICD-10-CM | POA: Diagnosis not present

## 2018-03-06 DIAGNOSIS — D509 Iron deficiency anemia, unspecified: Secondary | ICD-10-CM | POA: Diagnosis not present

## 2018-03-06 DIAGNOSIS — D631 Anemia in chronic kidney disease: Secondary | ICD-10-CM | POA: Diagnosis not present

## 2018-03-06 DIAGNOSIS — Z992 Dependence on renal dialysis: Secondary | ICD-10-CM | POA: Diagnosis not present

## 2018-03-06 DIAGNOSIS — N186 End stage renal disease: Secondary | ICD-10-CM | POA: Diagnosis not present

## 2018-03-07 DIAGNOSIS — Z992 Dependence on renal dialysis: Secondary | ICD-10-CM | POA: Diagnosis not present

## 2018-03-07 DIAGNOSIS — N186 End stage renal disease: Secondary | ICD-10-CM | POA: Diagnosis not present

## 2018-03-08 DIAGNOSIS — Z992 Dependence on renal dialysis: Secondary | ICD-10-CM | POA: Diagnosis not present

## 2018-03-08 DIAGNOSIS — D509 Iron deficiency anemia, unspecified: Secondary | ICD-10-CM | POA: Diagnosis not present

## 2018-03-08 DIAGNOSIS — N186 End stage renal disease: Secondary | ICD-10-CM | POA: Diagnosis not present

## 2018-03-08 DIAGNOSIS — D631 Anemia in chronic kidney disease: Secondary | ICD-10-CM | POA: Diagnosis not present

## 2018-03-11 DIAGNOSIS — N186 End stage renal disease: Secondary | ICD-10-CM | POA: Diagnosis not present

## 2018-03-11 DIAGNOSIS — D631 Anemia in chronic kidney disease: Secondary | ICD-10-CM | POA: Diagnosis not present

## 2018-03-11 DIAGNOSIS — N2581 Secondary hyperparathyroidism of renal origin: Secondary | ICD-10-CM | POA: Diagnosis not present

## 2018-03-11 DIAGNOSIS — D509 Iron deficiency anemia, unspecified: Secondary | ICD-10-CM | POA: Diagnosis not present

## 2018-03-11 DIAGNOSIS — Z992 Dependence on renal dialysis: Secondary | ICD-10-CM | POA: Diagnosis not present

## 2018-03-13 DIAGNOSIS — D509 Iron deficiency anemia, unspecified: Secondary | ICD-10-CM | POA: Diagnosis not present

## 2018-03-13 DIAGNOSIS — N186 End stage renal disease: Secondary | ICD-10-CM | POA: Diagnosis not present

## 2018-03-13 DIAGNOSIS — N2581 Secondary hyperparathyroidism of renal origin: Secondary | ICD-10-CM | POA: Diagnosis not present

## 2018-03-13 DIAGNOSIS — D631 Anemia in chronic kidney disease: Secondary | ICD-10-CM | POA: Diagnosis not present

## 2018-03-13 DIAGNOSIS — Z992 Dependence on renal dialysis: Secondary | ICD-10-CM | POA: Diagnosis not present

## 2018-03-15 DIAGNOSIS — D631 Anemia in chronic kidney disease: Secondary | ICD-10-CM | POA: Diagnosis not present

## 2018-03-15 DIAGNOSIS — D509 Iron deficiency anemia, unspecified: Secondary | ICD-10-CM | POA: Diagnosis not present

## 2018-03-15 DIAGNOSIS — Z992 Dependence on renal dialysis: Secondary | ICD-10-CM | POA: Diagnosis not present

## 2018-03-15 DIAGNOSIS — N186 End stage renal disease: Secondary | ICD-10-CM | POA: Diagnosis not present

## 2018-03-15 DIAGNOSIS — N2581 Secondary hyperparathyroidism of renal origin: Secondary | ICD-10-CM | POA: Diagnosis not present

## 2018-03-18 DIAGNOSIS — D509 Iron deficiency anemia, unspecified: Secondary | ICD-10-CM | POA: Diagnosis not present

## 2018-03-18 DIAGNOSIS — D631 Anemia in chronic kidney disease: Secondary | ICD-10-CM | POA: Diagnosis not present

## 2018-03-18 DIAGNOSIS — N2581 Secondary hyperparathyroidism of renal origin: Secondary | ICD-10-CM | POA: Diagnosis not present

## 2018-03-18 DIAGNOSIS — Z992 Dependence on renal dialysis: Secondary | ICD-10-CM | POA: Diagnosis not present

## 2018-03-18 DIAGNOSIS — N186 End stage renal disease: Secondary | ICD-10-CM | POA: Diagnosis not present

## 2018-03-19 DIAGNOSIS — M545 Low back pain: Secondary | ICD-10-CM | POA: Diagnosis not present

## 2018-03-19 DIAGNOSIS — M130 Polyarthritis, unspecified: Secondary | ICD-10-CM | POA: Diagnosis not present

## 2018-03-19 DIAGNOSIS — Z79891 Long term (current) use of opiate analgesic: Secondary | ICD-10-CM | POA: Diagnosis not present

## 2018-03-19 DIAGNOSIS — M25569 Pain in unspecified knee: Secondary | ICD-10-CM | POA: Diagnosis not present

## 2018-03-20 DIAGNOSIS — Z992 Dependence on renal dialysis: Secondary | ICD-10-CM | POA: Diagnosis not present

## 2018-03-20 DIAGNOSIS — D509 Iron deficiency anemia, unspecified: Secondary | ICD-10-CM | POA: Diagnosis not present

## 2018-03-20 DIAGNOSIS — N2581 Secondary hyperparathyroidism of renal origin: Secondary | ICD-10-CM | POA: Diagnosis not present

## 2018-03-20 DIAGNOSIS — N186 End stage renal disease: Secondary | ICD-10-CM | POA: Diagnosis not present

## 2018-03-20 DIAGNOSIS — D631 Anemia in chronic kidney disease: Secondary | ICD-10-CM | POA: Diagnosis not present

## 2018-03-22 DIAGNOSIS — D631 Anemia in chronic kidney disease: Secondary | ICD-10-CM | POA: Diagnosis not present

## 2018-03-22 DIAGNOSIS — D509 Iron deficiency anemia, unspecified: Secondary | ICD-10-CM | POA: Diagnosis not present

## 2018-03-22 DIAGNOSIS — N186 End stage renal disease: Secondary | ICD-10-CM | POA: Diagnosis not present

## 2018-03-22 DIAGNOSIS — N2581 Secondary hyperparathyroidism of renal origin: Secondary | ICD-10-CM | POA: Diagnosis not present

## 2018-03-22 DIAGNOSIS — Z992 Dependence on renal dialysis: Secondary | ICD-10-CM | POA: Diagnosis not present

## 2018-03-25 DIAGNOSIS — N2581 Secondary hyperparathyroidism of renal origin: Secondary | ICD-10-CM | POA: Diagnosis not present

## 2018-03-25 DIAGNOSIS — N186 End stage renal disease: Secondary | ICD-10-CM | POA: Diagnosis not present

## 2018-03-25 DIAGNOSIS — D631 Anemia in chronic kidney disease: Secondary | ICD-10-CM | POA: Diagnosis not present

## 2018-03-25 DIAGNOSIS — Z992 Dependence on renal dialysis: Secondary | ICD-10-CM | POA: Diagnosis not present

## 2018-03-25 DIAGNOSIS — D509 Iron deficiency anemia, unspecified: Secondary | ICD-10-CM | POA: Diagnosis not present

## 2018-03-26 ENCOUNTER — Ambulatory Visit (INDEPENDENT_AMBULATORY_CARE_PROVIDER_SITE_OTHER): Payer: Medicare Other | Admitting: Family Medicine

## 2018-03-26 ENCOUNTER — Encounter: Payer: Self-pay | Admitting: Family Medicine

## 2018-03-26 VITALS — BP 132/72 | HR 62 | Temp 97.8°F | Ht <= 58 in | Wt 169.0 lb

## 2018-03-26 DIAGNOSIS — I120 Hypertensive chronic kidney disease with stage 5 chronic kidney disease or end stage renal disease: Secondary | ICD-10-CM

## 2018-03-26 DIAGNOSIS — R600 Localized edema: Secondary | ICD-10-CM | POA: Diagnosis not present

## 2018-03-26 DIAGNOSIS — N186 End stage renal disease: Secondary | ICD-10-CM

## 2018-03-26 DIAGNOSIS — E039 Hypothyroidism, unspecified: Secondary | ICD-10-CM

## 2018-03-26 MED ORDER — BUDESONIDE-FORMOTEROL FUMARATE 160-4.5 MCG/ACT IN AERO: 2.0000 | INHALATION_SPRAY | Freq: Two times a day (BID) | RESPIRATORY_TRACT | 2 refills | Status: DC

## 2018-03-26 MED ORDER — ALBUTEROL SULFATE HFA 108 (90 BASE) MCG/ACT IN AERS: 2.0000 | INHALATION_SPRAY | Freq: Four times a day (QID) | RESPIRATORY_TRACT | 2 refills | Status: DC | PRN

## 2018-03-26 NOTE — Progress Notes (Signed)
Subjective: PI:RJJOACZYSAYTKZ PCP: Sherry Norlander, Sherry Cox SWF:UXNATFT Sherry Cox is a 57 y.o. female presenting to clinic today for:  1. Hypothyroidism History: Onset in 1996 after becoming ill with influenza.  No known family history of thyroid disorder.  Family history is significant for RA in her sister.  She was seen in July and had thyroid labs performed.  TSH was noted to be 0.182.  She follows up today for recheck.  Denies any tremor, heart palpitations.  She reports decreased energy and lower extremity edema.  She notes that when her thyroid was too low this was occurring.  2.  Lower extremity edema Patient with known hypertension and and stage renal disease on hemodialysis.  Patient reports a greater than 1 week history of bilateral lower extremity edema.  She notes that she came off of her hemodialysis early on Thursday and Saturday of last week but that the lower extremity edema preceded this.  She notes having had a salt indiscretion a couple of weeks ago but nothing recently.  Denies any orthopnea, shortness of breath.  She does report some cracked skin of the feet secondary to edema.  She had a full session of hemodialysis yesterday.  She notes that her ideal weight is 64.5 kg.  Yesterday's weight was 75.5 kg.     ROS: Per HPI  Allergies  Allergen Reactions  . Augmentin [Amoxicillin-Pot Clavulanate] Nausea And Vomiting    Vomiting   Past Medical History:  Diagnosis Date  . Ankle injury   . Anxiety   . Anxiety disorder   . CAD (coronary artery disease)    Total RCA, stress echo [facility] 11/2008-no ischemia, EF 55%; h/o CABG  . Carotid artery stenosis    12/10/2008 doppler 73-22% RICA, 0-25% LICA/see evaluation by Dr. Oneida Alar 2011  . Ejection fraction    EF 60-65%, echo, June, 2009  . ESRD on dialysis [facility])    Transplant consideration  . Hemochromatosis   . Hx of CABG    Off pump LIMA to LAD  05/2006  . Hx of tobacco use, presenting hazards to health 04/26/2012  .  Hyperlipidemia   . Irregular heart beat   . Murmur    October, 2012  . Neuropathy   . Overweight(278.02)   . Tobacco abuse     Current Outpatient Medications:  .  albuterol (VENTOLIN HFA) 108 (90 Base) MCG/ACT inhaler, Inhale 2 puffs into the lungs every 6 (six) hours as needed for wheezing or shortness of breath., Disp: 1 Inhaler, Rfl: 2 .  amLODipine (NORVASC) 10 MG tablet, Take 1 tablet (10 mg total) by mouth daily. (Patient taking differently: Take 10 mg by mouth at bedtime. ), Disp: 90 tablet, Rfl: 3 .  Artificial Tear Ointment (DRY EYES OP), Place 1 drop into both eyes daily., Disp: , Rfl:  .  aspirin EC 81 MG tablet, Take 81 mg by mouth 2 (two) times daily., Disp: , Rfl:  .  atorvastatin (LIPITOR) 80 MG tablet, TAKE ONE TABLET BY MOUTH DAILY., Disp: 90 tablet, Rfl: 1 .  budesonide-formoterol (SYMBICORT) 160-4.5 MCG/ACT inhaler, Inhale 2 puffs into the lungs 2 (two) times daily., Disp: 1 Inhaler, Rfl: 0 .  clonazePAM (KLONOPIN) 0.5 MG tablet, Take 1 tablet (0.5 mg total) by mouth 4 (four) times daily., Disp: 120 tablet, Rfl: 2 .  DULoxetine (CYMBALTA) 60 MG capsule, Take 1 capsule (60 mg total) by mouth daily., Disp: 30 capsule, Rfl: 2 .  fluticasone (FLONASE) 50 MCG/ACT nasal spray, USE 2 SPRAYS IN [facility]  NOSTRIL ONCE DAILY.  SHAKE GENTLY BEFORE USING., Disp: 16 g, Rfl: 6 .  folic acid (FOLVITE) 1 MG tablet, Take 1 tablet (1 mg total) by mouth daily., Disp: 90 tablet, Rfl: 4 .  folic acid-vitamin b complex-vitamin c-selenium-zinc (DIALYVITE) 3 MG TABS, Take 1 tablet by mouth daily.  , Disp: , Rfl:  .  HYDROcodone-acetaminophen (NORCO) 5-325 MG tablet, Take 1 tablet by mouth 2 (two) times daily as needed for moderate pain., Disp: 90 tablet, Rfl: 0 .  ibuprofen (ADVIL,MOTRIN) 200 MG tablet, Take 400 mg by mouth every 6 (six) hours as needed for mild pain., Disp: , Rfl:  .  levothyroxine (SYNTHROID, LEVOTHROID) 175 MCG tablet, TAKE ONE TABLET BY MOUTH DAILY BEFORE BREAKFAST., Disp: 90  tablet, Rfl: 1 .  meloxicam (MOBIC) 7.5 MG tablet, Take 7.5 mg by mouth daily., Disp: , Rfl: 1 .  metoprolol tartrate (LOPRESSOR) 50 MG tablet, TAKE ONE TABLET BY MOUTH TWICE DAILY ON MONDAY, WEDNESDAY AND FRIDAY. TAKE ONE TABLET ON REMAINING DAYS., Disp: 70 tablet, Rfl: 1 .  NARCAN 4 MG/0.1ML LIQD nasal spray kit, Place 1 spray into the nose once as needed. overdose, Disp: , Rfl: 0 .  nitroGLYCERIN (NITROSTAT) 0.4 MG SL tablet, Place 1 tablet (0.4 mg total) under the tongue every 5 (five) minutes as needed for chest pain., Disp: 75 tablet, Rfl: 3 .  OXYGEN, Inhale into the lungs. On 2, Disp: , Rfl:  .  sertraline (ZOLOFT) 100 MG tablet, Take 1 tablet (100 mg total) by mouth daily., Disp: 90 tablet, Rfl: 3 .  sevelamer (RENVELA) 800 MG tablet, Take 1,600-4,000 mg by mouth as directed. Take 5 tablets (4036m) by mouth with meals and 1600 tablets by mouth with snacks., Disp: , Rfl:  .  sodium bicarbonate 650 MG tablet, Take 650 mg by mouth 2 (two) times daily. , Disp: , Rfl:  .  sodium chloride (OCEAN) 0.65 % SOLN nasal spray, Place 1 spray into both nostrils as needed for congestion., Disp: , Rfl:  .  traMADol (ULTRAM) 50 MG tablet, take one at bedtime, Disp: , Rfl: 0   Social History   Socioeconomic History  . Marital status: Married    Spouse name: Not on file  . Number of children: Not on file  . Years of education: Not on file  . Highest education level: Not on file  Occupational History  . Occupation: DISABLED  Social Needs  . Financial resource strain: Not on file  . Food insecurity:    Worry: Not on file    Inability: Not on file  . Transportation needs:    Medical: Not on file    Non-medical: Not on file  Tobacco Use  . Smoking status: Current Every Day Smoker    Packs/day: 0.10    Years: 30.00    Pack years: 3.00    Types: Cigarettes    Start date: 02/02/1972  . Smokeless tobacco: Never Used  Substance and Sexual Activity  . Alcohol use: No    Alcohol/week: 0.0 standard  drinks  . Drug use: No  . Sexual activity: Yes  Lifestyle  . Physical activity:    Days per week: Not on file    Minutes per session: Not on file  . Stress: Not on file  Relationships  . Social connections:    Talks on phone: Not on file    Gets together: Not on file    Attends religious service: Not on file    Active member of club  or organization: Not on file    Attends meetings of clubs or organizations: Not on file    Relationship status: Not on file  . Intimate partner violence:    Fear of current or ex partner: Not on file    Emotionally abused: Not on file    Physically abused: Not on file    Forced sexual activity: Not on file  Other Topics Concern  . Not on file  Social History Narrative  . Not on file   Family History  Problem Relation Age of Onset  . Depression Sister   . Dementia Sister   . OCD Sister   . Diabetes Sister   . Hypertension Sister   . Heart attack Sister   . Bipolar disorder Brother   . Drug abuse Brother   . Diabetes Brother   . Hypertension Brother   . Heart disease Brother   . Hemochromatosis Brother   . Bipolar disorder Other   . ADD / ADHD Other   . Seizures Other   . Bipolar disorder Other   . Alcohol abuse Other   . Dementia Mother   . Heart disease Mother   . Hypertension Mother   . Heart attack Mother   . Alcohol abuse Father   . Heart disease Father        Heart Disease before age 79  . Hypertension Father   . Heart attack Father   . Dementia Maternal Aunt   . OCD Sister   . Heart disease Sister        Heart Disease before age 24  . Diabetes Sister   . Hemochromatosis Sister   . Hypertension Sister   . Rheum arthritis Sister   . Heart attack Brother   . COPD Brother   . Heart attack Brother   . Paranoid behavior Neg Hx   . Schizophrenia Neg Hx   . Sexual abuse Neg Hx   . Physical abuse Neg Hx     Objective: Office vital signs reviewed. BP 132/72   Pulse 62   Temp 97.8 F (36.6 C) (Oral)   Ht _0  (1.473  m)   Wt 169 lb (76.7 kg)   BMI 35.32 kg/m   Physical Examination:  General: Awake, alert, chronically ill appearing. No acute distress HEENT: no JVD; no goiter.  No palpable thyroid nodules or masses Cardio: regular rate and rhythm, S1S2 heard, no murmurs appreciated Pulm: clear to auscultation bilaterally, no wheezes, rhonchi or rales; normal work of breathing on room air Extremities: warm, well perfused, 2+pitting edema to knees. No cyanosis or clubbing; +2 pulses bilaterally Neuro: No resting tremor.  Assessment/ Plan: 57 y.o. female   1. Hypothyroidism, unspecified type Recheck thyroid panel.  Patient is working on weight loss and I suspect that we may actually need to decrease her Synthroid.  Of note, she is taking her medications altogether and not separating the Synthroid dose. - Thyroid Panel With TSH  2. Bilateral lower extremity edema Difficult to tell if this is related to salt intake versus inadequate diuresis with hemodialysis versus venous stasis versus secondary to oncotic pressures in the setting of anemia and low protein.  Compression hose prescribed.  A written Rx was given. - Basic Metabolic Panel - CBC with Differential  3. Benign hypertension with ESRD (end-stage renal disease) ([place]) Blood pressure under fair control today.  I recommended that she have her hemodialysis unit send me a copy of the lab work.  She has hemodialysis scheduled for  tomorrow. - Basic Metabolic Panel - CBC with Differential   Orders Placed This Encounter  Procedures  . Thyroid Panel With TSH  . Basic Metabolic Panel  . CBC with Differential   Meds ordered this encounter  Medications  . budesonide-formoterol (SYMBICORT) 160-4.5 MCG/ACT inhaler    Sig: Inhale 2 puffs into the lungs 2 (two) times daily.    Dispense:  1 Inhaler    Refill:  2  . albuterol (VENTOLIN HFA) 108 (90 Base) MCG/ACT inhaler    Sig: Inhale 2 puffs into the lungs every 6 (six) hours as needed for wheezing or  shortness of breath.    Dispense:  1 Inhaler    Refill:  [place], [place] [phone]

## 2018-03-26 NOTE — Patient Instructions (Addendum)
You had labs performed today.  You will be contacted with the results of the labs once they are available, usually in the next 3 business days for routine lab work.    Edema Edema is when you have too much fluid in your body or under your skin. Edema may make your legs, feet, and ankles swell up. Swelling is also common in looser tissues, like around your eyes. This is a common condition. It gets more common as you get older. There are many possible causes of edema. Eating too much salt (sodium) and being on your feet or sitting for a long time can cause edema in your legs, feet, and ankles. Hot weather may make edema worse. Edema is usually painless. Your skin may look swollen or shiny. Follow these instructions at home:  Keep the swollen body part raised (elevated) above the level of your heart when you are sitting or lying down.  Do not sit still or stand for a long time.  Do not wear tight clothes. Do not wear garters on your upper legs.  Exercise your legs. This can help the swelling go down.  Wear elastic bandages or support stockings as told by your doctor.  Eat a low-salt (low-sodium) diet to reduce fluid as told by your doctor.  Depending on the cause of your swelling, you may need to limit how much fluid you drink (fluid restriction).  Take over-the-counter and prescription medicines only as told by your doctor. Contact a doctor if:  Treatment is not working.  You have heart, liver, or kidney disease and have symptoms of edema.  You have sudden and unexplained weight gain. Get help right away if:  You have shortness of breath or chest pain.  You cannot breathe when you lie down.  You have pain, redness, or warmth in the swollen areas.  You have heart, liver, or kidney disease and get edema all of a sudden.  You have a fever and your symptoms get worse all of a sudden. Summary  Edema is when you have too much fluid in your body or under your skin.  Edema may make  your legs, feet, and ankles swell up. Swelling is also common in looser tissues, like around your eyes.  Raise (elevate) the swollen body part above the level of your heart when you are sitting or lying down.  Follow your doctor's instructions about diet and how much fluid you can drink (fluid restriction). This information is not intended to replace advice given to you by your health care provider. Make sure you discuss any questions you have with your health care provider. Document Released: 12/12/2007 Document Revised: 07/13/2016 Document Reviewed: 07/13/2016 Elsevier Interactive Patient Education  2017 [employer].

## 2018-03-27 ENCOUNTER — Other Ambulatory Visit: Payer: Self-pay | Admitting: Family Medicine

## 2018-03-27 DIAGNOSIS — D631 Anemia in chronic kidney disease: Secondary | ICD-10-CM | POA: Diagnosis not present

## 2018-03-27 DIAGNOSIS — N2581 Secondary hyperparathyroidism of renal origin: Secondary | ICD-10-CM | POA: Diagnosis not present

## 2018-03-27 DIAGNOSIS — D509 Iron deficiency anemia, unspecified: Secondary | ICD-10-CM | POA: Diagnosis not present

## 2018-03-27 DIAGNOSIS — N186 End stage renal disease: Secondary | ICD-10-CM | POA: Diagnosis not present

## 2018-03-27 DIAGNOSIS — Z992 Dependence on renal dialysis: Secondary | ICD-10-CM | POA: Diagnosis not present

## 2018-03-27 LAB — CBC WITH DIFFERENTIAL/PLATELET
BASOS ABS: 0.1 10*3/uL (ref 0.0–0.2)
Basos: 1 %
EOS (ABSOLUTE): 0.1 10*3/uL (ref 0.0–0.4)
Eos: 1 %
Hematocrit: 33 % — ABNORMAL LOW (ref 34.0–46.6)
Hemoglobin: 10.7 g/dL — ABNORMAL LOW (ref 11.1–15.9)
Immature Grans (Abs): 0 10*3/uL (ref 0.0–0.1)
Immature Granulocytes: 0 %
LYMPHS ABS: 0.8 10*3/uL (ref 0.7–3.1)
Lymphs: 14 %
MCH: 31 pg (ref 26.6–33.0)
MCHC: 32.4 g/dL (ref 31.5–35.7)
MCV: 96 fL (ref 79–97)
Monocytes Absolute: 0.6 10*3/uL (ref 0.1–0.9)
Monocytes: 11 %
NEUTROS ABS: 4.2 10*3/uL (ref 1.4–7.0)
Neutrophils: 73 %
PLATELETS: 224 10*3/uL (ref 150–450)
RBC: 3.45 x10E6/uL — ABNORMAL LOW (ref 3.77–5.28)
RDW: 14 % (ref 12.3–15.4)
WBC: 5.8 10*3/uL (ref 3.4–10.8)

## 2018-03-27 LAB — BASIC METABOLIC PANEL
BUN / CREAT RATIO: 3 — AB (ref 9–23)
BUN: 19 mg/dL (ref 6–24)
CHLORIDE: 92 mmol/L — AB (ref 96–106)
CO2: 25 mmol/L (ref 20–29)
Calcium: 8.8 mg/dL (ref 8.7–10.2)
Creatinine, Ser: 5.58 mg/dL (ref 0.57–1.00)
GFR calc non Af Amer: 8 mL/min/{1.73_m2} — ABNORMAL LOW (ref >59)
GFR, EST AFRICAN AMERICAN: 9 mL/min/{1.73_m2} — AB (ref >59)
Glucose: 83 mg/dL (ref 65–99)
POTASSIUM: 5.1 mmol/L (ref 3.5–5.2)
Sodium: 135 mmol/L (ref 134–144)

## 2018-03-27 LAB — THYROID PANEL WITH TSH
FREE THYROXINE INDEX: 2.1 (ref 1.2–4.9)
T3 UPTAKE RATIO: 26 % (ref 24–39)
T4, Total: 7.9 ug/dL (ref 4.5–12.0)
TSH: 0.32 u[IU]/mL — AB (ref 0.450–4.500)

## 2018-03-27 MED ORDER — LEVOTHYROXINE SODIUM 150 MCG PO TABS: 150.0000 ug | ORAL_TABLET | Freq: Every day | ORAL | 1 refills | Status: DC

## 2018-03-27 NOTE — Addendum Note (Signed)
Addended byFaylene Million C on: 03/27/2018 11:02 AM   Modules accepted: Orders

## 2018-03-29 DIAGNOSIS — N186 End stage renal disease: Secondary | ICD-10-CM | POA: Diagnosis not present

## 2018-03-29 DIAGNOSIS — D631 Anemia in chronic kidney disease: Secondary | ICD-10-CM | POA: Diagnosis not present

## 2018-03-29 DIAGNOSIS — Z992 Dependence on renal dialysis: Secondary | ICD-10-CM | POA: Diagnosis not present

## 2018-03-29 DIAGNOSIS — N2581 Secondary hyperparathyroidism of renal origin: Secondary | ICD-10-CM | POA: Diagnosis not present

## 2018-03-29 DIAGNOSIS — D509 Iron deficiency anemia, unspecified: Secondary | ICD-10-CM | POA: Diagnosis not present

## 2018-04-01 DIAGNOSIS — N2581 Secondary hyperparathyroidism of renal origin: Secondary | ICD-10-CM | POA: Diagnosis not present

## 2018-04-01 DIAGNOSIS — D509 Iron deficiency anemia, unspecified: Secondary | ICD-10-CM | POA: Diagnosis not present

## 2018-04-01 DIAGNOSIS — D631 Anemia in chronic kidney disease: Secondary | ICD-10-CM | POA: Diagnosis not present

## 2018-04-01 DIAGNOSIS — Z992 Dependence on renal dialysis: Secondary | ICD-10-CM | POA: Diagnosis not present

## 2018-04-01 DIAGNOSIS — N186 End stage renal disease: Secondary | ICD-10-CM | POA: Diagnosis not present

## 2018-04-03 DIAGNOSIS — N2581 Secondary hyperparathyroidism of renal origin: Secondary | ICD-10-CM | POA: Diagnosis not present

## 2018-04-03 DIAGNOSIS — Z992 Dependence on renal dialysis: Secondary | ICD-10-CM | POA: Diagnosis not present

## 2018-04-03 DIAGNOSIS — N186 End stage renal disease: Secondary | ICD-10-CM | POA: Diagnosis not present

## 2018-04-03 DIAGNOSIS — D631 Anemia in chronic kidney disease: Secondary | ICD-10-CM | POA: Diagnosis not present

## 2018-04-03 DIAGNOSIS — D509 Iron deficiency anemia, unspecified: Secondary | ICD-10-CM | POA: Diagnosis not present

## 2018-04-05 DIAGNOSIS — N2581 Secondary hyperparathyroidism of renal origin: Secondary | ICD-10-CM | POA: Diagnosis not present

## 2018-04-05 DIAGNOSIS — Z992 Dependence on renal dialysis: Secondary | ICD-10-CM | POA: Diagnosis not present

## 2018-04-05 DIAGNOSIS — D631 Anemia in chronic kidney disease: Secondary | ICD-10-CM | POA: Diagnosis not present

## 2018-04-05 DIAGNOSIS — D509 Iron deficiency anemia, unspecified: Secondary | ICD-10-CM | POA: Diagnosis not present

## 2018-04-05 DIAGNOSIS — N186 End stage renal disease: Secondary | ICD-10-CM | POA: Diagnosis not present

## 2018-04-07 DIAGNOSIS — Z992 Dependence on renal dialysis: Secondary | ICD-10-CM | POA: Diagnosis not present

## 2018-04-07 DIAGNOSIS — N186 End stage renal disease: Secondary | ICD-10-CM | POA: Diagnosis not present

## 2018-04-08 ENCOUNTER — Other Ambulatory Visit: Payer: Self-pay | Admitting: Cardiology

## 2018-04-08 ENCOUNTER — Other Ambulatory Visit (HOSPITAL_COMMUNITY): Payer: Self-pay | Admitting: Psychiatry

## 2018-04-08 ENCOUNTER — Other Ambulatory Visit: Payer: Self-pay | Admitting: Family Medicine

## 2018-04-08 DIAGNOSIS — D631 Anemia in chronic kidney disease: Secondary | ICD-10-CM | POA: Diagnosis not present

## 2018-04-08 DIAGNOSIS — D509 Iron deficiency anemia, unspecified: Secondary | ICD-10-CM | POA: Diagnosis not present

## 2018-04-08 DIAGNOSIS — Z992 Dependence on renal dialysis: Secondary | ICD-10-CM | POA: Diagnosis not present

## 2018-04-08 DIAGNOSIS — Z23 Encounter for immunization: Secondary | ICD-10-CM | POA: Diagnosis not present

## 2018-04-08 DIAGNOSIS — N186 End stage renal disease: Secondary | ICD-10-CM | POA: Diagnosis not present

## 2018-04-08 DIAGNOSIS — N2581 Secondary hyperparathyroidism of renal origin: Secondary | ICD-10-CM | POA: Diagnosis not present

## 2018-04-10 DIAGNOSIS — N2581 Secondary hyperparathyroidism of renal origin: Secondary | ICD-10-CM | POA: Diagnosis not present

## 2018-04-10 DIAGNOSIS — N186 End stage renal disease: Secondary | ICD-10-CM | POA: Diagnosis not present

## 2018-04-10 DIAGNOSIS — D631 Anemia in chronic kidney disease: Secondary | ICD-10-CM | POA: Diagnosis not present

## 2018-04-10 DIAGNOSIS — Z23 Encounter for immunization: Secondary | ICD-10-CM | POA: Diagnosis not present

## 2018-04-10 DIAGNOSIS — Z992 Dependence on renal dialysis: Secondary | ICD-10-CM | POA: Diagnosis not present

## 2018-04-10 DIAGNOSIS — D509 Iron deficiency anemia, unspecified: Secondary | ICD-10-CM | POA: Diagnosis not present

## 2018-04-12 DIAGNOSIS — D631 Anemia in chronic kidney disease: Secondary | ICD-10-CM | POA: Diagnosis not present

## 2018-04-12 DIAGNOSIS — N186 End stage renal disease: Secondary | ICD-10-CM | POA: Diagnosis not present

## 2018-04-12 DIAGNOSIS — D509 Iron deficiency anemia, unspecified: Secondary | ICD-10-CM | POA: Diagnosis not present

## 2018-04-12 DIAGNOSIS — N2581 Secondary hyperparathyroidism of renal origin: Secondary | ICD-10-CM | POA: Diagnosis not present

## 2018-04-12 DIAGNOSIS — Z23 Encounter for immunization: Secondary | ICD-10-CM | POA: Diagnosis not present

## 2018-04-12 DIAGNOSIS — Z992 Dependence on renal dialysis: Secondary | ICD-10-CM | POA: Diagnosis not present

## 2018-04-15 ENCOUNTER — Other Ambulatory Visit: Payer: Self-pay | Admitting: Cardiology

## 2018-04-15 DIAGNOSIS — N2581 Secondary hyperparathyroidism of renal origin: Secondary | ICD-10-CM | POA: Diagnosis not present

## 2018-04-15 DIAGNOSIS — D631 Anemia in chronic kidney disease: Secondary | ICD-10-CM | POA: Diagnosis not present

## 2018-04-15 DIAGNOSIS — D509 Iron deficiency anemia, unspecified: Secondary | ICD-10-CM | POA: Diagnosis not present

## 2018-04-15 DIAGNOSIS — Z992 Dependence on renal dialysis: Secondary | ICD-10-CM | POA: Diagnosis not present

## 2018-04-15 DIAGNOSIS — Z23 Encounter for immunization: Secondary | ICD-10-CM | POA: Diagnosis not present

## 2018-04-15 DIAGNOSIS — N186 End stage renal disease: Secondary | ICD-10-CM | POA: Diagnosis not present

## 2018-04-17 DIAGNOSIS — N186 End stage renal disease: Secondary | ICD-10-CM | POA: Diagnosis not present

## 2018-04-17 DIAGNOSIS — Z992 Dependence on renal dialysis: Secondary | ICD-10-CM | POA: Diagnosis not present

## 2018-04-17 DIAGNOSIS — D509 Iron deficiency anemia, unspecified: Secondary | ICD-10-CM | POA: Diagnosis not present

## 2018-04-17 DIAGNOSIS — Z23 Encounter for immunization: Secondary | ICD-10-CM | POA: Diagnosis not present

## 2018-04-17 DIAGNOSIS — N2581 Secondary hyperparathyroidism of renal origin: Secondary | ICD-10-CM | POA: Diagnosis not present

## 2018-04-17 DIAGNOSIS — D631 Anemia in chronic kidney disease: Secondary | ICD-10-CM | POA: Diagnosis not present

## 2018-04-19 DIAGNOSIS — N186 End stage renal disease: Secondary | ICD-10-CM | POA: Diagnosis not present

## 2018-04-19 DIAGNOSIS — N2581 Secondary hyperparathyroidism of renal origin: Secondary | ICD-10-CM | POA: Diagnosis not present

## 2018-04-19 DIAGNOSIS — D631 Anemia in chronic kidney disease: Secondary | ICD-10-CM | POA: Diagnosis not present

## 2018-04-19 DIAGNOSIS — D509 Iron deficiency anemia, unspecified: Secondary | ICD-10-CM | POA: Diagnosis not present

## 2018-04-19 DIAGNOSIS — Z992 Dependence on renal dialysis: Secondary | ICD-10-CM | POA: Diagnosis not present

## 2018-04-19 DIAGNOSIS — Z23 Encounter for immunization: Secondary | ICD-10-CM | POA: Diagnosis not present

## 2018-04-22 DIAGNOSIS — Z23 Encounter for immunization: Secondary | ICD-10-CM | POA: Diagnosis not present

## 2018-04-22 DIAGNOSIS — Z992 Dependence on renal dialysis: Secondary | ICD-10-CM | POA: Diagnosis not present

## 2018-04-22 DIAGNOSIS — N2581 Secondary hyperparathyroidism of renal origin: Secondary | ICD-10-CM | POA: Diagnosis not present

## 2018-04-22 DIAGNOSIS — D509 Iron deficiency anemia, unspecified: Secondary | ICD-10-CM | POA: Diagnosis not present

## 2018-04-22 DIAGNOSIS — D631 Anemia in chronic kidney disease: Secondary | ICD-10-CM | POA: Diagnosis not present

## 2018-04-22 DIAGNOSIS — N186 End stage renal disease: Secondary | ICD-10-CM | POA: Diagnosis not present

## 2018-04-24 DIAGNOSIS — D509 Iron deficiency anemia, unspecified: Secondary | ICD-10-CM | POA: Diagnosis not present

## 2018-04-24 DIAGNOSIS — D631 Anemia in chronic kidney disease: Secondary | ICD-10-CM | POA: Diagnosis not present

## 2018-04-24 DIAGNOSIS — N186 End stage renal disease: Secondary | ICD-10-CM | POA: Diagnosis not present

## 2018-04-24 DIAGNOSIS — N2581 Secondary hyperparathyroidism of renal origin: Secondary | ICD-10-CM | POA: Diagnosis not present

## 2018-04-24 DIAGNOSIS — Z992 Dependence on renal dialysis: Secondary | ICD-10-CM | POA: Diagnosis not present

## 2018-04-24 DIAGNOSIS — Z23 Encounter for immunization: Secondary | ICD-10-CM | POA: Diagnosis not present

## 2018-04-26 ENCOUNTER — Other Ambulatory Visit: Payer: Self-pay | Admitting: Family Medicine

## 2018-04-26 DIAGNOSIS — N2581 Secondary hyperparathyroidism of renal origin: Secondary | ICD-10-CM | POA: Diagnosis not present

## 2018-04-26 DIAGNOSIS — N186 End stage renal disease: Secondary | ICD-10-CM | POA: Diagnosis not present

## 2018-04-26 DIAGNOSIS — Z992 Dependence on renal dialysis: Secondary | ICD-10-CM | POA: Diagnosis not present

## 2018-04-26 DIAGNOSIS — Z23 Encounter for immunization: Secondary | ICD-10-CM | POA: Diagnosis not present

## 2018-04-26 DIAGNOSIS — D509 Iron deficiency anemia, unspecified: Secondary | ICD-10-CM | POA: Diagnosis not present

## 2018-04-26 DIAGNOSIS — D631 Anemia in chronic kidney disease: Secondary | ICD-10-CM | POA: Diagnosis not present

## 2018-04-29 DIAGNOSIS — D509 Iron deficiency anemia, unspecified: Secondary | ICD-10-CM | POA: Diagnosis not present

## 2018-04-29 DIAGNOSIS — Z23 Encounter for immunization: Secondary | ICD-10-CM | POA: Diagnosis not present

## 2018-04-29 DIAGNOSIS — N186 End stage renal disease: Secondary | ICD-10-CM | POA: Diagnosis not present

## 2018-04-29 DIAGNOSIS — N2581 Secondary hyperparathyroidism of renal origin: Secondary | ICD-10-CM | POA: Diagnosis not present

## 2018-04-29 DIAGNOSIS — Z992 Dependence on renal dialysis: Secondary | ICD-10-CM | POA: Diagnosis not present

## 2018-04-29 DIAGNOSIS — D631 Anemia in chronic kidney disease: Secondary | ICD-10-CM | POA: Diagnosis not present

## 2018-05-01 ENCOUNTER — Other Ambulatory Visit: Payer: Self-pay | Admitting: Cardiology

## 2018-05-01 DIAGNOSIS — N186 End stage renal disease: Secondary | ICD-10-CM | POA: Diagnosis not present

## 2018-05-01 DIAGNOSIS — Z992 Dependence on renal dialysis: Secondary | ICD-10-CM | POA: Diagnosis not present

## 2018-05-01 DIAGNOSIS — D509 Iron deficiency anemia, unspecified: Secondary | ICD-10-CM | POA: Diagnosis not present

## 2018-05-01 DIAGNOSIS — N2581 Secondary hyperparathyroidism of renal origin: Secondary | ICD-10-CM | POA: Diagnosis not present

## 2018-05-01 DIAGNOSIS — D631 Anemia in chronic kidney disease: Secondary | ICD-10-CM | POA: Diagnosis not present

## 2018-05-01 DIAGNOSIS — Z23 Encounter for immunization: Secondary | ICD-10-CM | POA: Diagnosis not present

## 2018-05-03 DIAGNOSIS — N186 End stage renal disease: Secondary | ICD-10-CM | POA: Diagnosis not present

## 2018-05-03 DIAGNOSIS — D509 Iron deficiency anemia, unspecified: Secondary | ICD-10-CM | POA: Diagnosis not present

## 2018-05-03 DIAGNOSIS — D631 Anemia in chronic kidney disease: Secondary | ICD-10-CM | POA: Diagnosis not present

## 2018-05-03 DIAGNOSIS — N2581 Secondary hyperparathyroidism of renal origin: Secondary | ICD-10-CM | POA: Diagnosis not present

## 2018-05-03 DIAGNOSIS — Z992 Dependence on renal dialysis: Secondary | ICD-10-CM | POA: Diagnosis not present

## 2018-05-03 DIAGNOSIS — Z23 Encounter for immunization: Secondary | ICD-10-CM | POA: Diagnosis not present

## 2018-05-06 DIAGNOSIS — N2581 Secondary hyperparathyroidism of renal origin: Secondary | ICD-10-CM | POA: Diagnosis not present

## 2018-05-06 DIAGNOSIS — Z992 Dependence on renal dialysis: Secondary | ICD-10-CM | POA: Diagnosis not present

## 2018-05-06 DIAGNOSIS — D509 Iron deficiency anemia, unspecified: Secondary | ICD-10-CM | POA: Diagnosis not present

## 2018-05-06 DIAGNOSIS — Z23 Encounter for immunization: Secondary | ICD-10-CM | POA: Diagnosis not present

## 2018-05-06 DIAGNOSIS — N186 End stage renal disease: Secondary | ICD-10-CM | POA: Diagnosis not present

## 2018-05-06 DIAGNOSIS — D631 Anemia in chronic kidney disease: Secondary | ICD-10-CM | POA: Diagnosis not present

## 2018-05-08 DIAGNOSIS — Z992 Dependence on renal dialysis: Secondary | ICD-10-CM | POA: Diagnosis not present

## 2018-05-08 DIAGNOSIS — Z23 Encounter for immunization: Secondary | ICD-10-CM | POA: Diagnosis not present

## 2018-05-08 DIAGNOSIS — D631 Anemia in chronic kidney disease: Secondary | ICD-10-CM | POA: Diagnosis not present

## 2018-05-08 DIAGNOSIS — D509 Iron deficiency anemia, unspecified: Secondary | ICD-10-CM | POA: Diagnosis not present

## 2018-05-08 DIAGNOSIS — N186 End stage renal disease: Secondary | ICD-10-CM | POA: Diagnosis not present

## 2018-05-08 DIAGNOSIS — N2581 Secondary hyperparathyroidism of renal origin: Secondary | ICD-10-CM | POA: Diagnosis not present

## 2018-05-09 ENCOUNTER — Encounter (HOSPITAL_COMMUNITY): Payer: Self-pay | Admitting: Psychiatry

## 2018-05-09 ENCOUNTER — Ambulatory Visit (INDEPENDENT_AMBULATORY_CARE_PROVIDER_SITE_OTHER): Payer: Medicare Other | Admitting: Psychiatry

## 2018-05-09 DIAGNOSIS — F331 Major depressive disorder, recurrent, moderate: Secondary | ICD-10-CM | POA: Diagnosis not present

## 2018-05-09 DIAGNOSIS — Z992 Dependence on renal dialysis: Secondary | ICD-10-CM

## 2018-05-09 DIAGNOSIS — I6523 Occlusion and stenosis of bilateral carotid arteries: Secondary | ICD-10-CM

## 2018-05-09 DIAGNOSIS — N186 End stage renal disease: Secondary | ICD-10-CM | POA: Diagnosis not present

## 2018-05-09 MED ORDER — DULOXETINE HCL 60 MG PO CPEP: 60.0000 mg | ORAL_CAPSULE | Freq: Every day | ORAL | 2 refills | Status: DC

## 2018-05-09 MED ORDER — SERTRALINE HCL 100 MG PO TABS: 100.0000 mg | ORAL_TABLET | Freq: Every day | ORAL | 3 refills | Status: DC

## 2018-05-09 MED ORDER — CLONAZEPAM 0.5 MG PO TABS: 0.5000 mg | ORAL_TABLET | Freq: Four times a day (QID) | ORAL | 2 refills | Status: DC
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
## 2018-05-10 DIAGNOSIS — N2581 Secondary hyperparathyroidism of renal origin: Secondary | ICD-10-CM | POA: Diagnosis not present

## 2018-05-10 DIAGNOSIS — Z992 Dependence on renal dialysis: Secondary | ICD-10-CM | POA: Diagnosis not present

## 2018-05-10 DIAGNOSIS — D509 Iron deficiency anemia, unspecified: Secondary | ICD-10-CM | POA: Diagnosis not present

## 2018-05-10 DIAGNOSIS — N186 End stage renal disease: Secondary | ICD-10-CM | POA: Diagnosis not present

## 2018-05-12 ENCOUNTER — Ambulatory Visit (INDEPENDENT_AMBULATORY_CARE_PROVIDER_SITE_OTHER): Payer: Medicare Other | Admitting: Family Medicine

## 2018-05-12 ENCOUNTER — Encounter: Payer: Self-pay | Admitting: Family Medicine

## 2018-05-12 VITALS — BP 152/70 | HR 64 | Temp 97.1°F | Ht <= 58 in | Wt 170.0 lb

## 2018-05-12 DIAGNOSIS — F17200 Nicotine dependence, unspecified, uncomplicated: Secondary | ICD-10-CM

## 2018-05-12 DIAGNOSIS — E039 Hypothyroidism, unspecified: Secondary | ICD-10-CM

## 2018-05-12 DIAGNOSIS — I120 Hypertensive chronic kidney disease with stage 5 chronic kidney disease or end stage renal disease: Secondary | ICD-10-CM | POA: Diagnosis not present

## 2018-05-12 DIAGNOSIS — F4321 Adjustment disorder with depressed mood: Secondary | ICD-10-CM | POA: Diagnosis not present

## 2018-05-12 DIAGNOSIS — N186 End stage renal disease: Secondary | ICD-10-CM

## 2018-05-12 NOTE — Patient Instructions (Signed)
You had labs performed today.  You will be contacted with the results of the labs once they are available, usually in the next 3 business days for routine lab work.    I am very sorry to hear that your sister passed away.  I am going to have a counselor reach out to you via telephone.    Complicated Grieving Grief is a normal response to the death of someone close to you. Feelings of fear, anger, and guilt can affect almost everyone who loses a loved one. It is also common to have symptoms of depression while you are grieving. These include problems with sleep, loss of appetite, and lack of energy. They may last for weeks or months after a loss. Complicated grief is different from normal grief or depression. Normal grieving involves sadness and feelings of loss, but these feelings are not constant. Complicated grief is a constant and severe type of grief. It interferes with your ability to function normally. It may last for several months to a year or longer. Complicated grief may require treatment from a mental health care provider. What are the causes? It is not known why some people continue to struggle with grief and others do not. You may be at higher risk for complicated grief if:  The death of your loved one was sudden or unexpected.  The death of your loved one was due to a violent event.  Your loved one committed suicide.  Your loved one was a child or a young person.  You were very close to or dependent on the loved one.  You have a history of depression.  What are the signs or symptoms? Signs and symptoms of complicated grief may include:  Feeling disbelief or numbness.  Being unable to enjoy good memories of your loved one.  Needing to avoid anything that reminds you of your loved one.  Being unable to stop thinking about the death.  Feeling intense anger or guilt.  Feeling alone and hopeless.  Feeling that your life is meaningless and empty.  Losing the desire to  live.  How is this diagnosed? Your health care provider may diagnose complicated grief if:  You have constant symptoms of grief for 6-12 months or longer.  Your symptoms are interfering with your ability to live your life.  Your health care provider may want you to see a mental health care provider. Many symptoms of depression are similar to the symptoms of complicated grief. It is important to be evaluated for complicated grief along with other mental health conditions. How is this treated? Talk therapy with a mental health provider is the most common treatment for complicated grief. During therapy, you will learn healthy ways to cope with the loss of your loved one. In some cases, your mental health care provider may also recommend antidepressant medicines. Follow these instructions at home:  Take care of yourself. ? Eat regular meals and maintain a healthy diet. Eat plenty of fruits, vegetables, and whole grains. ? Try to get some exercise each day. ? Keep regular hours for sleep. Try to get at least 8 hours of sleep each night.  Do not use drugs or alcohol to ease your symptoms.  Take medicines only as directed by your health care provider.  Spend time with friends and loved ones.  Consider joining a grief (bereavement) support group to help you deal with your loss.  Keep all follow-up visits as directed by your health care provider. This is important. Contact  a health care provider if:  Your symptoms keep you from functioning normally.  Your symptoms do not get better with treatment. Get help right away if:  You have serious thoughts of hurting yourself or someone else.  You have suicidal feelings. This information is not intended to replace advice given to you by your health care provider. Make sure you discuss any questions you have with your health care provider. Document Released: 06/25/2005 Document Revised: 12/01/2015 Document Reviewed: 12/03/2013 Elsevier  Interactive Patient Education  [employer].

## 2018-05-12 NOTE — Progress Notes (Signed)
Subjective: QQ:[id number] PCP: Sherry Norlander, DO EYC:XKGYJEH Soderquist is a 57 y.o. female presenting to clinic today for:  1. Hypothyroidism History: Onset in 1996 after becoming ill with influenza.  No known family history of thyroid disorder.  Family history is significant for RA in her sister.  Patient last seen in September.  Her TSH level had improved some but was persistently low at 0.320.  She reports that her swelling in the lower extremities has improved some since her dose was adjusted to Synthroid 150 mcg daily.  She denies any tremor, heart palpitations, difficulty swallowing or changes in voice.  Her energy continues to be poor and she continues to have slight lower extremity edema but this again is improving.   2. Grieving Patient reports the recent loss of her sister.  She reports that this was unexpected and that her sister passed from clots from her lower extremity that gone into her brain.  She reports depressive symptoms over this.  She is not started going back to her counselor yet but thinks that she may be interested in going to see them again.  She is currently under the care of psychiatry and on multiple medications to manage this.  She feels like she has good support from her other sister but states that they have not had time to sit down and chat yet.  3. HTN/ Tobacco use Patient reports that she has not taken any of her medications today.  Denies any chest pain, shortness of breath.  She is been using her inhalers as directed.  She has had lower extremity edema but that seems to be improving with recent change in thyroid medicine dose.  She has been reducing her smoking and notes that she is been on the same pack of cigarettes for the last 2 weeks.  Sometimes she does not smoke at all.  ROS: Per HPI  Allergies  Allergen Reactions  . Augmentin [Amoxicillin-Pot Clavulanate] Nausea And Vomiting    Vomiting   Past Medical History:  Diagnosis Date  . Ankle  injury   . Anxiety   . Anxiety disorder   . CAD (coronary artery disease)    Total RCA, stress echo [facility] 11/2008-no ischemia, EF 55%; h/o CABG  . Carotid artery stenosis    12/10/2008 doppler 63-14% RICA, 9-70% LICA/see evaluation by Dr. Oneida Alar 2011  . Ejection fraction    EF 60-65%, echo, June, 2009  . ESRD on dialysis [facility])    Transplant consideration  . Hemochromatosis   . Hx of CABG    Off pump LIMA to LAD  05/2006  . Hx of tobacco use, presenting hazards to health 04/26/2012  . Hyperlipidemia   . Irregular heart beat   . Murmur    October, 2012  . Neuropathy   . Overweight(278.02)   . Tobacco abuse     Current Outpatient Medications:  .  albuterol (VENTOLIN HFA) 108 (90 Base) MCG/ACT inhaler, Inhale 2 puffs into the lungs every 6 (six) hours as needed for wheezing or shortness of breath., Disp: 1 Inhaler, Rfl: 2 .  amLODipine (NORVASC) 10 MG tablet, TAKE ONE TABLET BY MOUTH DAILY., Disp: 90 tablet, Rfl: 3 .  Artificial Tear Ointment (DRY EYES OP), Place 1 drop into both eyes daily., Disp: , Rfl:  .  aspirin EC 81 MG tablet, Take 81 mg by mouth 2 (two) times daily., Disp: , Rfl:  .  atorvastatin (LIPITOR) 80 MG tablet, TAKE ONE TABLET BY MOUTH DAILY., Disp: 90 tablet,  Rfl: 1 .  budesonide-formoterol (SYMBICORT) 160-4.5 MCG/ACT inhaler, Inhale 2 puffs into the lungs 2 (two) times daily., Disp: 1 Inhaler, Rfl: 2 .  clonazePAM (KLONOPIN) 0.5 MG tablet, Take 1 tablet (0.5 mg total) by mouth 4 (four) times daily., Disp: 120 tablet, Rfl: 2 .  DULoxetine (CYMBALTA) 60 MG capsule, Take 1 capsule (60 mg total) by mouth daily., Disp: 30 capsule, Rfl: 2 .  fluticasone (FLONASE) 50 MCG/ACT nasal spray, USE 2 SPRAYS IN EACH NOSTRIL ONCE DAILY.  SHAKE GENTLY BEFORE USING., Disp: 16 g, Rfl: 6 .  folic acid (FOLVITE) 1 MG tablet, Take 1 tablet (1 mg total) by mouth daily., Disp: 90 tablet, Rfl: 4 .  folic acid-vitamin b complex-vitamin c-selenium-zinc (DIALYVITE) 3 MG TABS, Take 1 tablet by mouth  daily.  , Disp: , Rfl:  .  HYDROcodone-acetaminophen (NORCO) 5-325 MG tablet, Take 1 tablet by mouth 2 (two) times daily as needed for moderate pain., Disp: 90 tablet, Rfl: 0 .  ibuprofen (ADVIL,MOTRIN) 200 MG tablet, Take 400 mg by mouth every 6 (six) hours as needed for mild pain., Disp: , Rfl:  .  levothyroxine (SYNTHROID, LEVOTHROID) 150 MCG tablet, TAKE ONE TABLET BY MOUTH DAILY., Disp: 30 tablet, Rfl: 10 .  meloxicam (MOBIC) 7.5 MG tablet, Take 7.5 mg by mouth daily., Disp: , Rfl: 1 .  metoprolol tartrate (LOPRESSOR) 50 MG tablet, TAKE ONE TABLET BY MOUTH TWICE DAILY ON MONDAY, WEDNESDAY AND FRIDAY. TAKE ONE TABLET ON REMAINING DAYS., Disp: 70 tablet, Rfl: 1 .  NARCAN 4 MG/0.1ML LIQD nasal spray kit, Place 1 spray into the nose once as needed. overdose, Disp: , Rfl: 0 .  nitroGLYCERIN (NITROSTAT) 0.4 MG SL tablet, Place 1 tablet (0.4 mg total) under the tongue every 5 (five) minutes as needed for chest pain., Disp: 75 tablet, Rfl: 0 .  OXYGEN, Inhale into the lungs. On 2, Disp: , Rfl:  .  sertraline (ZOLOFT) 100 MG tablet, Take 1 tablet (100 mg total) by mouth daily., Disp: 90 tablet, Rfl: 3 .  sevelamer (RENVELA) 800 MG tablet, Take 1,600-4,000 mg by mouth as directed. Take 5 tablets (4073m) by mouth with meals and 1600 tablets by mouth with snacks., Disp: , Rfl:  .  sodium bicarbonate 650 MG tablet, Take 650 mg by mouth 2 (two) times daily. , Disp: , Rfl:  .  sodium chloride (OCEAN) 0.65 % SOLN nasal spray, Place 1 spray into both nostrils as needed for congestion., Disp: , Rfl:  .  traMADol (ULTRAM) 50 MG tablet, take one at bedtime, Disp: , Rfl: 0   Social History   Socioeconomic History  . Marital status: Married    Spouse name: Not on file  . Number of children: Not on file  . Years of education: Not on file  . Highest education level: Not on file  Occupational History  . Occupation: DISABLED  Social Needs  . Financial resource strain: Not on file  . Food insecurity:     Worry: Not on file    Inability: Not on file  . Transportation needs:    Medical: Not on file    Non-medical: Not on file  Tobacco Use  . Smoking status: Current Some Day Smoker    Packs/day: 0.10    Years: 30.00    Pack years: 3.00    Types: Cigarettes    Start date: 02/02/1972  . Smokeless tobacco: Never Used  Substance and Sexual Activity  . Alcohol use: No    Alcohol/week: 0.0 standard  drinks  . Drug use: No  . Sexual activity: Yes  Lifestyle  . Physical activity:    Days per week: Not on file    Minutes per session: Not on file  . Stress: Not on file  Relationships  . Social connections:    Talks on phone: Not on file    Gets together: Not on file    Attends religious service: Not on file    Active member of club or organization: Not on file    Attends meetings of clubs or organizations: Not on file    Relationship status: Not on file  . Intimate partner violence:    Fear of current or ex partner: Not on file    Emotionally abused: Not on file    Physically abused: Not on file    Forced sexual activity: Not on file  Other Topics Concern  . Not on file  Social History Narrative  . Not on file   Family History  Problem Relation Age of Onset  . Depression Sister   . Dementia Sister   . OCD Sister   . Diabetes Sister   . Hypertension Sister   . Heart attack Sister   . Bipolar disorder Brother   . Drug abuse Brother   . Diabetes Brother   . Hypertension Brother   . Heart disease Brother   . Hemochromatosis Brother   . Bipolar disorder Other   . ADD / ADHD Other   . Seizures Other   . Bipolar disorder Other   . Alcohol abuse Other   . Dementia Mother   . Heart disease Mother   . Hypertension Mother   . Heart attack Mother   . Alcohol abuse Father   . Heart disease Father        Heart Disease before age 100  . Hypertension Father   . Heart attack Father   . Dementia Maternal Aunt   . OCD Sister   . Heart disease Sister        Heart Disease before  age 46  . Diabetes Sister   . Hemochromatosis Sister   . Hypertension Sister   . Rheum arthritis Sister   . Heart attack Brother   . COPD Brother   . Heart attack Brother   . Paranoid behavior Neg Hx   . Schizophrenia Neg Hx   . Sexual abuse Neg Hx   . Physical abuse Neg Hx     Objective: Office vital signs reviewed. BP (!) 152/70   Pulse 64   Temp (!) 97.1 F (36.2 C) (Oral)   Ht '4\' 10"'  (1.473 m)   Wt 170 lb (77.1 kg)   SpO2 100%   BMI 35.53 kg/m   Physical Examination:  General: Awake, alert, chronically ill appearing. No acute distress HEENT: slight exophthalmos. no goiter.  No palpable thyroid nodules or masses Cardio: regular rate and rhythm, S1S2 heard, soft murmur appreciated Pulm: clear to auscultation bilaterally, no wheezes, rhonchi or rales; normal work of breathing on room air Extremities: warm, well perfused, trace to 1+ pitting edema to knees. No cyanosis or clubbing; +2 pulses bilaterally Neuro: No resting tremor. Psych: Mood depressed.  Affect appropriate.  Speech normal.  Good eye contact. Depression screen [facility] 2/9 05/12/2018 03/26/2018 02/03/2018  Decreased Interest 1 3 0  Down, Depressed, Hopeless 3 0 0  PHQ - 2 Score 4 3 0  Altered sleeping 2 1 -  Tired, decreased energy 2 3 -  Change in appetite 2  2 -  Feeling bad or failure about yourself  0 0 -  Trouble concentrating 2 0 -  Moving slowly or fidgety/restless 2 1 -  Suicidal thoughts 0 0 -  PHQ-9 Score 14 10 -  Difficult doing work/chores Somewhat difficult - -  Some encounter information is confidential and restricted. Go to Review Flowsheets activity to see all data.  Some recent data might be hidden    Assessment/ Plan: 57 y.o. female   1. Hypothyroidism, unspecified type Check TSH.  If persistently abnormal, we will plan to decrease Synthroid further.  I suspect that her low energy is related to chronic illness and depressive symptoms related to grieving as below. - TSH  2. Benign  hypertension with ESRD (end-stage renal disease) ([place]) Has not taking her medications today yet.  3. Grieving I am placing a referral to virtual behavioral health so that a counselor reaches out to the patient.  I have also encouraged her to go ahead and schedule an appointment with a counselor at her psychiatrist's office.  We discussed that she may also come and see me if needed.  Will provide support as able.  4. Tobacco use disorder Action phase of cessation.   Orders Placed This Encounter  Procedures  . TSH   No orders of the defined types were placed in this encounter.   Sherry Norlander, DO [place] [phone]

## 2018-05-13 ENCOUNTER — Telehealth: Payer: Self-pay

## 2018-05-13 ENCOUNTER — Telehealth: Payer: Self-pay | Admitting: Family Medicine

## 2018-05-13 DIAGNOSIS — N2581 Secondary hyperparathyroidism of renal origin: Secondary | ICD-10-CM | POA: Diagnosis not present

## 2018-05-13 DIAGNOSIS — Z992 Dependence on renal dialysis: Secondary | ICD-10-CM | POA: Diagnosis not present

## 2018-05-13 DIAGNOSIS — D509 Iron deficiency anemia, unspecified: Secondary | ICD-10-CM | POA: Diagnosis not present

## 2018-05-13 DIAGNOSIS — F4321 Adjustment disorder with depressed mood: Secondary | ICD-10-CM

## 2018-05-13 DIAGNOSIS — N186 End stage renal disease: Secondary | ICD-10-CM | POA: Diagnosis not present

## 2018-05-13 LAB — TSH: TSH: 1.44 u[IU]/mL (ref 0.450–4.500)

## 2018-05-13 NOTE — BH Specialist Note (Signed)
[facility] Initial Clinical Assessment  MRN: [id number] NAME: Sherry Cox Date: 05/13/18   Total time: 1 hour  Type of Contact: Type of Contact: Phone Call Initial Contact Patient consent obtained: Patient consent obtained for Virtual Visit: (NA) Reason for Visit today: Reason for Your Call/Visit Today: [facility] Initial Intake Assessment   Treatment History Patient recently received Inpatient Treatment: Have You Recently Been in Any Inpatient Treatment (Hospital/Detox/Crisis Center/28-Day Program)?: No  Facility/Program:  NA  Date of discharge:  NA  Patient currently being seen by therapist/psychiatrist: Do You Currently Have a Therapist/Psychiatrist?: Yes  - Dr. Maurice Small and Dr. Harrington Challenger   Patient currently receiving the following services: Patient Currently Receiving the Following Services:: Medication Management([facility] )  Psychiatric History  Past Psychiatric History/Hospitalization(s): Anxiety: Yes Bipolar Disorder: No Depression: Yes Mania: No Psychosis: No Schizophrenia: No Personality Disorder: No Hospitalization for psychiatric illness: No History of Electroconvulsive Shock Therapy: No Prior Suicide Attempts: No Decreased need for sleep: No  Euphoria: No Self Injurious behaviors No Family History of mental illness: No Family History of substance abuse: No  Substance Abuse: No  DUI: No  Insomnia: No  History of violence No  Physical, sexual or emotional abuse:No  Prior outpatient mental health therapy: Yes - 2019 [facility] with Dr. Harrington Challenger and Dr. Maurice Small    Clinical Assessment:  PHQ-9 Assessments: Depression screen [facility] 2/9 05/13/2018 05/12/2018 03/26/2018  Decreased Interest _0 Down, Depressed, Hopeless 3 3 0  PHQ - 2 Score _1 Altered sleeping _2 Tired, decreased energy _3 Change in appetite _4 Feeling bad or failure about yourself  3 0 0  Trouble concentrating 1 2 0  Moving slowly or  fidgety/restless 0 2 1  Suicidal thoughts 0 0 0  PHQ-9 Score _5 Difficult doing work/chores Somewhat difficult Somewhat difficult -  Some recent data might be hidden    GAD-7 Assessments: GAD 7 : Generalized Anxiety Score 05/13/2018  Nervous, Anxious, on Edge 3  Control/stop worrying 3  Worry too much - different things 2  Trouble relaxing 2  Restless 1  Easily annoyed or irritable 1  Afraid - awful might happen 3  Total GAD 7 Score 15  Anxiety Difficulty Somewhat difficult     Social Functioning Social maturity: Social Maturity: Responsible Social judgement: Social Judgement: Normal  Stress Current stressors: Current Stressors: (Sister died last week ) Familial stressors: Familial Stressors: None Sleep: Sleep: Decreased, Difficulty staying asleep Appetite: Appetite: Increased, Loss of appetite Coping ability: Coping ability: Deficient support system, Exhausted, Overwhelmed Patient taking medications as prescribed: Patient taking medications as prescribed: Yes  Current medications:  Outpatient Encounter Medications as of 05/13/2018  Medication Sig  . albuterol (VENTOLIN HFA) 108 (90 Base) MCG/ACT inhaler Inhale 2 puffs into the lungs every 6 (six) hours as needed for wheezing or shortness of breath.  Marland Kitchen amLODipine (NORVASC) 10 MG tablet TAKE ONE TABLET BY MOUTH DAILY.  Marland Kitchen Artificial Tear Ointment (DRY EYES OP) Place 1 drop into both eyes daily.  Marland Kitchen aspirin EC 81 MG tablet Take 81 mg by mouth 2 (two) times daily.  Marland Kitchen atorvastatin (LIPITOR) 80 MG tablet TAKE ONE TABLET BY MOUTH DAILY.  . budesonide-formoterol (SYMBICORT) 160-4.5 MCG/ACT inhaler Inhale 2 puffs into the lungs 2 (two) times daily.  . clonazePAM (KLONOPIN) 0.5 MG tablet Take 1 tablet (0.5 mg total) by mouth 4 (four) times daily.  Marland Kitchen  DULoxetine (CYMBALTA) 60 MG capsule Take 1 capsule (60 mg total) by mouth daily.  . fluticasone (FLONASE) 50 MCG/ACT nasal spray USE 2 SPRAYS IN EACH NOSTRIL ONCE DAILY.  SHAKE GENTLY  BEFORE USING.  . folic acid (FOLVITE) 1 MG tablet Take 1 tablet (1 mg total) by mouth daily.  . folic acid-vitamin b complex-vitamin c-selenium-zinc (DIALYVITE) 3 MG TABS Take 1 tablet by mouth daily.    Marland Kitchen HYDROcodone-acetaminophen (NORCO) 5-325 MG tablet Take 1 tablet by mouth 2 (two) times daily as needed for moderate pain.  Marland Kitchen ibuprofen (ADVIL,MOTRIN) 200 MG tablet Take 400 mg by mouth every 6 (six) hours as needed for mild pain.  Marland Kitchen levothyroxine (SYNTHROID, LEVOTHROID) 150 MCG tablet TAKE ONE TABLET BY MOUTH DAILY.  . meloxicam (MOBIC) 7.5 MG tablet Take 7.5 mg by mouth daily.  . metoprolol tartrate (LOPRESSOR) 50 MG tablet TAKE ONE TABLET BY MOUTH TWICE DAILY ON MONDAY, WEDNESDAY AND FRIDAY. TAKE ONE TABLET ON REMAINING DAYS.  Marland Kitchen NARCAN 4 MG/0.1ML LIQD nasal spray kit Place 1 spray into the nose once as needed. overdose  . nitroGLYCERIN (NITROSTAT) 0.4 MG SL tablet Place 1 tablet (0.4 mg total) under the tongue every 5 (five) minutes as needed for chest pain.  . OXYGEN Inhale into the lungs. On 2  . sertraline (ZOLOFT) 100 MG tablet Take 1 tablet (100 mg total) by mouth daily.  . sevelamer (RENVELA) 800 MG tablet Take 1,600-4,000 mg by mouth as directed. Take 5 tablets (4061m) by mouth with meals and 1600 tablets by mouth with snacks.  . sodium bicarbonate 650 MG tablet Take 650 mg by mouth 2 (two) times daily.   . sodium chloride (OCEAN) 0.65 % SOLN nasal spray Place 1 spray into both nostrils as needed for congestion.  . traMADol (ULTRAM) 50 MG tablet take one at bedtime   No facility-administered encounter medications on file as of 1Nov 21, 2019     Self-harm Behaviors Risk Assessment Self-harm risk factors: Self-harm risk factors: (None Reported) Patient endorses recent thoughts of harming self: Have you recently had any thoughts about harming yourself?: No  CMalawiSuicide Severity Rating Scale:  C-SRSS 111-21-2019 1. Wish to be Dead No  2. Suicidal Thoughts No  6. Suicide Behavior  Question No    Danger to Others Risk Assessment Danger to others risk factors: Danger to Others Risk Factors: No risk factors noted Patient endorses recent thoughts of harming others: Notification required: No need or identified person    Substance Use Assessment Patient recently consumed alcohol:  No  Alcohol Use Disorder Identification Test (AUDIT):  Alcohol Use Disorder Test (AUDIT) 111-21-19 1. How often do you have a drink containing alcohol? 0  2. How many drinks containing alcohol do you have on a typical day when you are drinking? 0  3. How often do you have six or more drinks on one occasion? 0  AUDIT-C Score 0  Intervention/Follow-up AUDIT Score <7 follow-up not indicated   Patient recently used drugs:  No Patient is concerned about dependence or abuse of substances:  No    Goals, Interventions and Follow-up Plan Goals: Increase healthy adjustment to current life circumstances and Begin healthy grieving over loss Interventions: Motivational Interviewing Follow-up Plan: [facility] Phone Follow UP   Summary of Clinical Assessment  Summary:   [facility] Initial Intake - Patient is a 57year old female.  Patient reports depression associated with her death of her sister in 203/09/2017  Patient reports that four of her brother and sisters in  2014, 2015,2016, 2017.   Patient report that she only has one sister living.  Patient denies having any children.   Patient reports frequent depressive episodes.  Patient reports waking up frequently since the death of her sister.  Patient reports that she receives psychiatric outpatient medication management with Dr. Harrington Challenger.    Patient reports that she has been taking Zoloft and Prozac for 6 years.  Her last appointment with Dr. Lorin Mercy was on 05-09-2018.  Patient reports that she was seeing Maurice Small in the past and she wants to resume services with Maurice Small.   Patient denies SI/HI/Psychosis/Substance Abuse. If your symptoms worsen or you have thoughts of  suicide/homicide, PLEASE SEEK IMMEDIATE MEDICAL ATTENTION.  You may always call:  National Suicide Hotline: (434) 293-4751;  [place] Crisis Line: 2017518384;  Crisis Recovery in [place]: 7733713979.  These are available 24 hours a day, 7 days a week.   Patient reports that she wants to continue receiving services with Dr. Harrington Challenger and Maurice Small.  Patient has renal failure and goes to dialysis three times a week.   Patient has been disabled for the past 13 years.  Patient is separated from her husband but he still assists her with day to day activities.      Graciella Freer [place], LCAS-A

## 2018-05-13 NOTE — Telephone Encounter (Signed)
Pt aware.

## 2018-05-13 NOTE — Telephone Encounter (Signed)
[email or web address]

## 2018-05-14 NOTE — Progress Notes (Signed)
Patient is under the care of Dr. Lorriane Shire. The patient was seen by Peggy in 2018. Will ask if the patient can be seen again. If not, will consider telephone therapy through [facility] or referral to other therapist.

## 2018-05-15 DIAGNOSIS — N186 End stage renal disease: Secondary | ICD-10-CM | POA: Diagnosis not present

## 2018-05-15 DIAGNOSIS — Z992 Dependence on renal dialysis: Secondary | ICD-10-CM | POA: Diagnosis not present

## 2018-05-15 DIAGNOSIS — D509 Iron deficiency anemia, unspecified: Secondary | ICD-10-CM | POA: Diagnosis not present

## 2018-05-15 DIAGNOSIS — N2581 Secondary hyperparathyroidism of renal origin: Secondary | ICD-10-CM | POA: Diagnosis not present

## 2018-05-17 DIAGNOSIS — D509 Iron deficiency anemia, unspecified: Secondary | ICD-10-CM | POA: Diagnosis not present

## 2018-05-17 DIAGNOSIS — N186 End stage renal disease: Secondary | ICD-10-CM | POA: Diagnosis not present

## 2018-05-17 DIAGNOSIS — N2581 Secondary hyperparathyroidism of renal origin: Secondary | ICD-10-CM | POA: Diagnosis not present

## 2018-05-17 DIAGNOSIS — Z992 Dependence on renal dialysis: Secondary | ICD-10-CM | POA: Diagnosis not present

## 2018-05-19 DIAGNOSIS — M25569 Pain in unspecified knee: Secondary | ICD-10-CM | POA: Diagnosis not present

## 2018-05-19 DIAGNOSIS — Z79891 Long term (current) use of opiate analgesic: Secondary | ICD-10-CM | POA: Diagnosis not present

## 2018-05-19 DIAGNOSIS — M545 Low back pain: Secondary | ICD-10-CM | POA: Diagnosis not present

## 2018-05-19 DIAGNOSIS — M130 Polyarthritis, unspecified: Secondary | ICD-10-CM | POA: Diagnosis not present

## 2018-05-20 DIAGNOSIS — N186 End stage renal disease: Secondary | ICD-10-CM | POA: Diagnosis not present

## 2018-05-20 DIAGNOSIS — N2581 Secondary hyperparathyroidism of renal origin: Secondary | ICD-10-CM | POA: Diagnosis not present

## 2018-05-20 DIAGNOSIS — D509 Iron deficiency anemia, unspecified: Secondary | ICD-10-CM | POA: Diagnosis not present

## 2018-05-20 DIAGNOSIS — Z992 Dependence on renal dialysis: Secondary | ICD-10-CM | POA: Diagnosis not present

## 2018-05-21 DIAGNOSIS — Z992 Dependence on renal dialysis: Secondary | ICD-10-CM | POA: Diagnosis not present

## 2018-05-21 DIAGNOSIS — E8770 Fluid overload, unspecified: Secondary | ICD-10-CM | POA: Diagnosis not present

## 2018-05-21 DIAGNOSIS — N186 End stage renal disease: Secondary | ICD-10-CM | POA: Diagnosis not present

## 2018-05-22 DIAGNOSIS — D509 Iron deficiency anemia, unspecified: Secondary | ICD-10-CM | POA: Diagnosis not present

## 2018-05-22 DIAGNOSIS — N2581 Secondary hyperparathyroidism of renal origin: Secondary | ICD-10-CM | POA: Diagnosis not present

## 2018-05-22 DIAGNOSIS — Z992 Dependence on renal dialysis: Secondary | ICD-10-CM | POA: Diagnosis not present

## 2018-05-22 DIAGNOSIS — N186 End stage renal disease: Secondary | ICD-10-CM | POA: Diagnosis not present

## 2018-05-24 DIAGNOSIS — N186 End stage renal disease: Secondary | ICD-10-CM | POA: Diagnosis not present

## 2018-05-24 DIAGNOSIS — D509 Iron deficiency anemia, unspecified: Secondary | ICD-10-CM | POA: Diagnosis not present

## 2018-05-24 DIAGNOSIS — N2581 Secondary hyperparathyroidism of renal origin: Secondary | ICD-10-CM | POA: Diagnosis not present

## 2018-05-24 DIAGNOSIS — Z992 Dependence on renal dialysis: Secondary | ICD-10-CM | POA: Diagnosis not present

## 2018-05-27 ENCOUNTER — Telehealth (HOSPITAL_COMMUNITY): Payer: Self-pay

## 2018-05-27 DIAGNOSIS — Z992 Dependence on renal dialysis: Secondary | ICD-10-CM | POA: Diagnosis not present

## 2018-05-27 DIAGNOSIS — N186 End stage renal disease: Secondary | ICD-10-CM | POA: Diagnosis not present

## 2018-05-27 DIAGNOSIS — D509 Iron deficiency anemia, unspecified: Secondary | ICD-10-CM | POA: Diagnosis not present

## 2018-05-27 DIAGNOSIS — N2581 Secondary hyperparathyroidism of renal origin: Secondary | ICD-10-CM | POA: Diagnosis not present

## 2018-05-27 NOTE — Telephone Encounter (Signed)
Patient reports that she was seen by Dr. Harrington Challenger on 05-09-2018 and she has an upcoming appt with Maurice Small on 07-21-18`1.  Patient denies SI/HI/Psychosis/SA.  Patient reports that she wants to resume servives with Peggy Bynm.    V[facility] will place the patient on the inactive list.

## 2018-05-28 ENCOUNTER — Other Ambulatory Visit: Payer: Self-pay | Admitting: Family Medicine

## 2018-05-29 DIAGNOSIS — N2581 Secondary hyperparathyroidism of renal origin: Secondary | ICD-10-CM | POA: Diagnosis not present

## 2018-05-29 DIAGNOSIS — D509 Iron deficiency anemia, unspecified: Secondary | ICD-10-CM | POA: Diagnosis not present

## 2018-05-29 DIAGNOSIS — N186 End stage renal disease: Secondary | ICD-10-CM | POA: Diagnosis not present

## 2018-05-29 DIAGNOSIS — Z992 Dependence on renal dialysis: Secondary | ICD-10-CM | POA: Diagnosis not present

## 2018-05-31 DIAGNOSIS — N2581 Secondary hyperparathyroidism of renal origin: Secondary | ICD-10-CM | POA: Diagnosis not present

## 2018-05-31 DIAGNOSIS — N186 End stage renal disease: Secondary | ICD-10-CM | POA: Diagnosis not present

## 2018-05-31 DIAGNOSIS — Z992 Dependence on renal dialysis: Secondary | ICD-10-CM | POA: Diagnosis not present

## 2018-05-31 DIAGNOSIS — D509 Iron deficiency anemia, unspecified: Secondary | ICD-10-CM | POA: Diagnosis not present

## 2018-06-03 DIAGNOSIS — N186 End stage renal disease: Secondary | ICD-10-CM | POA: Diagnosis not present

## 2018-06-03 DIAGNOSIS — N2581 Secondary hyperparathyroidism of renal origin: Secondary | ICD-10-CM | POA: Diagnosis not present

## 2018-06-03 DIAGNOSIS — Z992 Dependence on renal dialysis: Secondary | ICD-10-CM | POA: Diagnosis not present

## 2018-06-03 DIAGNOSIS — D509 Iron deficiency anemia, unspecified: Secondary | ICD-10-CM | POA: Diagnosis not present

## 2018-06-05 DIAGNOSIS — N2581 Secondary hyperparathyroidism of renal origin: Secondary | ICD-10-CM | POA: Diagnosis not present

## 2018-06-05 DIAGNOSIS — Z992 Dependence on renal dialysis: Secondary | ICD-10-CM | POA: Diagnosis not present

## 2018-06-05 DIAGNOSIS — D509 Iron deficiency anemia, unspecified: Secondary | ICD-10-CM | POA: Diagnosis not present

## 2018-06-05 DIAGNOSIS — N186 End stage renal disease: Secondary | ICD-10-CM | POA: Diagnosis not present

## 2018-06-07 DIAGNOSIS — D509 Iron deficiency anemia, unspecified: Secondary | ICD-10-CM | POA: Diagnosis not present

## 2018-06-07 DIAGNOSIS — N2581 Secondary hyperparathyroidism of renal origin: Secondary | ICD-10-CM | POA: Diagnosis not present

## 2018-06-07 DIAGNOSIS — N186 End stage renal disease: Secondary | ICD-10-CM | POA: Diagnosis not present

## 2018-06-07 DIAGNOSIS — Z992 Dependence on renal dialysis: Secondary | ICD-10-CM | POA: Diagnosis not present

## 2018-06-10 ENCOUNTER — Other Ambulatory Visit: Payer: Self-pay | Admitting: Cardiology

## 2018-06-10 ENCOUNTER — Other Ambulatory Visit: Payer: Self-pay | Admitting: Family Medicine

## 2018-06-10 DIAGNOSIS — D509 Iron deficiency anemia, unspecified: Secondary | ICD-10-CM | POA: Diagnosis not present

## 2018-06-10 DIAGNOSIS — N186 End stage renal disease: Secondary | ICD-10-CM | POA: Diagnosis not present

## 2018-06-10 DIAGNOSIS — N2581 Secondary hyperparathyroidism of renal origin: Secondary | ICD-10-CM | POA: Diagnosis not present

## 2018-06-10 DIAGNOSIS — Z992 Dependence on renal dialysis: Secondary | ICD-10-CM | POA: Diagnosis not present

## 2018-06-10 DIAGNOSIS — D631 Anemia in chronic kidney disease: Secondary | ICD-10-CM | POA: Diagnosis not present

## 2018-06-12 DIAGNOSIS — Z992 Dependence on renal dialysis: Secondary | ICD-10-CM | POA: Diagnosis not present

## 2018-06-12 DIAGNOSIS — N2581 Secondary hyperparathyroidism of renal origin: Secondary | ICD-10-CM | POA: Diagnosis not present

## 2018-06-12 DIAGNOSIS — D509 Iron deficiency anemia, unspecified: Secondary | ICD-10-CM | POA: Diagnosis not present

## 2018-06-12 DIAGNOSIS — N186 End stage renal disease: Secondary | ICD-10-CM | POA: Diagnosis not present

## 2018-06-12 DIAGNOSIS — D631 Anemia in chronic kidney disease: Secondary | ICD-10-CM | POA: Diagnosis not present

## 2018-06-14 DIAGNOSIS — Z992 Dependence on renal dialysis: Secondary | ICD-10-CM | POA: Diagnosis not present

## 2018-06-14 DIAGNOSIS — N186 End stage renal disease: Secondary | ICD-10-CM | POA: Diagnosis not present

## 2018-06-14 DIAGNOSIS — D631 Anemia in chronic kidney disease: Secondary | ICD-10-CM | POA: Diagnosis not present

## 2018-06-14 DIAGNOSIS — N2581 Secondary hyperparathyroidism of renal origin: Secondary | ICD-10-CM | POA: Diagnosis not present

## 2018-06-14 DIAGNOSIS — D509 Iron deficiency anemia, unspecified: Secondary | ICD-10-CM | POA: Diagnosis not present

## 2018-06-17 DIAGNOSIS — D509 Iron deficiency anemia, unspecified: Secondary | ICD-10-CM | POA: Diagnosis not present

## 2018-06-17 DIAGNOSIS — N2581 Secondary hyperparathyroidism of renal origin: Secondary | ICD-10-CM | POA: Diagnosis not present

## 2018-06-17 DIAGNOSIS — Z992 Dependence on renal dialysis: Secondary | ICD-10-CM | POA: Diagnosis not present

## 2018-06-17 DIAGNOSIS — N186 End stage renal disease: Secondary | ICD-10-CM | POA: Diagnosis not present

## 2018-06-17 DIAGNOSIS — D631 Anemia in chronic kidney disease: Secondary | ICD-10-CM | POA: Diagnosis not present

## 2018-06-18 ENCOUNTER — Encounter: Payer: Self-pay | Admitting: Cardiology

## 2018-06-18 ENCOUNTER — Ambulatory Visit (INDEPENDENT_AMBULATORY_CARE_PROVIDER_SITE_OTHER): Payer: Medicare Other | Admitting: Cardiology

## 2018-06-18 VITALS — BP 187/74 | HR 68 | Ht <= 58 in | Wt 175.2 lb

## 2018-06-18 DIAGNOSIS — I6529 Occlusion and stenosis of unspecified carotid artery: Secondary | ICD-10-CM | POA: Diagnosis not present

## 2018-06-18 DIAGNOSIS — I10 Essential (primary) hypertension: Secondary | ICD-10-CM

## 2018-06-18 DIAGNOSIS — I6523 Occlusion and stenosis of bilateral carotid arteries: Secondary | ICD-10-CM

## 2018-06-18 DIAGNOSIS — I2510 Atherosclerotic heart disease of native coronary artery without angina pectoris: Secondary | ICD-10-CM

## 2018-06-18 DIAGNOSIS — I340 Nonrheumatic mitral (valve) insufficiency: Secondary | ICD-10-CM

## 2018-06-18 MED ORDER — CARVEDILOL 12.5 MG PO TABS: 12.5000 mg | ORAL_TABLET | Freq: Two times a day (BID) | ORAL | 6 refills | Status: DC

## 2018-06-18 MED ORDER — ASPIRIN EC 81 MG PO TBEC: 81.0000 mg | DELAYED_RELEASE_TABLET | Freq: Every day | ORAL | Status: AC

## 2018-06-18 NOTE — Patient Instructions (Addendum)
Medication Instructions:   Decrease Aspirin to 81mg  daily.  Stop Lopressor.   Begin Coreg 12.5mg  twice a day.  Continue all other medications.    Labwork: none  Testing/Procedures:  Your physician has requested that you have an echocardiogram. Echocardiography is a painless test that uses sound waves to create images of your heart. It provides your doctor with information about the size and shape of your heart and how well your heart's chambers and valves are working. This procedure takes approximately one hour. There are no restrictions for this procedure.  Your physician has requested that you have a carotid duplex. This test is an ultrasound of the carotid arteries in your neck. It looks at blood flow through these arteries that supply the brain with blood. Allow one hour for this exam. There are no restrictions or special instructions.  BOTH TEST ABOVE DUE IN MARCH 2020.  Office will contact with results via phone or letter.    Follow-Up: Your physician wants you to follow up in: 6 months.  You will receive a reminder letter in the mail one-two months in advance.  If you don't receive a letter, please call our office to schedule the follow up appointment   Any Other Special Instructions Will Be Listed Below (If Applicable).  If you need a refill on your cardiac medications before your next appointment, please call your pharmacy.

## 2018-06-18 NOTE — Progress Notes (Signed)
Clinical Summary Sherry Cox is a 57 y.o.female seen today for follow up of the following medical problems.  1. CAD - prior CABG in 2007 - long history of chest pain. She has used SL NG regularly for many years since seeing her prior cardiologist, I have not been able to curb this habit.  - reports significant stress related to deaths in her family, has some associated chest pain. Better with NG which is chronic for her, uses regularly. Better with relaxing. Takes nitro up to 4-5 per day.     2. Carotid stenosis - followed by vascular - 09/2017 carotid US RICA 64-40%, LICA 34-74% -denies any neuro symptoms.   3. ESRD - compliant with HD  4. Hyperlipidemia - 09/2016 TC 110 HDL 55 LDL 39 - compliant with statin  5. HTN - compliant with meds  6. Moderate mitral regurgitation -noted by 09/2017 echo - no symptoms.    7. 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 Poorly visualized atrial septum, does appear to be a left to right shunt in area of PFO vs secundum ASD. By my review no significant RV enlargement or dysfunction. Asymptomatic   8. Chronic pain syndrome - followed by pcp   Past Medical History:  Diagnosis Date  . Ankle injury   . Anxiety   . Anxiety disorder   . CAD (coronary artery disease)    Total RCA, stress echo [facility] 11/2008-no ischemia, EF 55%; h/o CABG  . Carotid artery stenosis    12/10/2008 doppler 25-95% RICA, 6-38% LICA/see evaluation by Dr. Oneida Alar 2011  . Ejection fraction    EF 60-65%, echo, June, 2009  . ESRD on dialysis [facility])    Transplant consideration  . Hemochromatosis   . Hx of CABG    Off pump LIMA to LAD  05/2006  . Hx of tobacco use, presenting hazards to health 04/26/2012  . Hyperlipidemia   . Irregular heart beat   . Murmur    October, 2012  . Neuropathy   . Overweight(278.02)   . Tobacco abuse      Allergies  Allergen Reactions  . Augmentin [Amoxicillin-Pot Clavulanate] Nausea And Vomiting     Vomiting     Current Outpatient Medications  Medication Sig Dispense Refill  . amLODipine (NORVASC) 10 MG tablet TAKE ONE TABLET BY MOUTH DAILY. 90 tablet 3  . Artificial Tear Ointment (DRY EYES OP) Place 1 drop into both eyes daily.    Marland Kitchen aspirin EC 81 MG tablet Take 81 mg by mouth 2 (two) times daily.    Marland Kitchen atorvastatin (LIPITOR) 80 MG tablet TAKE ONE TABLET BY MOUTH DAILY. 90 tablet 1  . clonazePAM (KLONOPIN) 0.5 MG tablet Take 1 tablet (0.5 mg total) by mouth 4 (four) times daily. 120 tablet 2  . DULoxetine (CYMBALTA) 60 MG capsule Take 1 capsule (60 mg total) by mouth daily. 30 capsule 2  . fluticasone (FLONASE) 50 MCG/ACT nasal spray USE 2 SPRAYS IN EACH NOSTRIL ONCE DAILY.  SHAKE GENTLY BEFORE USING. 16 g 6  . folic acid (FOLVITE) 1 MG tablet TAKE ONE TABLET BY MOUTH DAILY. 90 tablet 1  . folic acid-vitamin b complex-vitamin c-selenium-zinc (DIALYVITE) 3 MG TABS Take 1 tablet by mouth daily.      Marland Kitchen HYDROcodone-acetaminophen (NORCO) 5-325 MG tablet Take 1 tablet by mouth 2 (two) times daily as needed for moderate pain. 90 tablet 0  . ibuprofen (ADVIL,MOTRIN) 200 MG tablet Take 400 mg by mouth every 6 (six) hours  as needed for mild pain.    Marland Kitchen levothyroxine (SYNTHROID, LEVOTHROID) 150 MCG tablet TAKE ONE TABLET BY MOUTH DAILY. 30 tablet 10  . meloxicam (MOBIC) 7.5 MG tablet Take 7.5 mg by mouth daily.  1  . metoprolol tartrate (LOPRESSOR) 50 MG tablet TAKE ONE TABLET BY MOUTH TWICE DAILY ON MONDAY, WEDNESDAY AND FRIDAY. TAKE ONE TABLET ON REMAINING DAYS. 70 tablet 1  . NARCAN 4 MG/0.1ML LIQD nasal spray kit Place 1 spray into the nose once as needed. overdose  0  . nitroGLYCERIN (NITROSTAT) 0.4 MG SL tablet DISSOLVE ONE TABLET UNDER TONGUE EVERY 5 MINUTES UP TO 3 DOSES AS NEEDED FOR CHEST PAIN 25 tablet 0  . OXYGEN Inhale into the lungs. On 2    . sertraline (ZOLOFT) 100 MG tablet Take 1 tablet (100 mg total) by mouth daily. 90 tablet 3  . sevelamer (RENVELA) 800 MG tablet Take  1,600-4,000 mg by mouth as directed. Take 5 tablets (4035m) by mouth with meals and 1600 tablets by mouth with snacks.    . sodium bicarbonate 650 MG tablet Take 650 mg by mouth 2 (two) times daily.     . sodium chloride (OCEAN) 0.65 % SOLN nasal spray Place 1 spray into both nostrils as needed for congestion.    . SYMBICORT 160-4.5 MCG/ACT inhaler INHALE TWO PUFFS INTO THE LUNGS TWICE DAILY. 10.2 g 0  . traMADol (ULTRAM) 50 MG tablet take one at bedtime  0  . VENTOLIN HFA 108 (90 Base) MCG/ACT inhaler INHALE TWO PUFFS INTO THE LUNGS EVERY 6 HOURS AS NEEDED FOR WHEEZING OR SHORTNESS OF BREATH. 18 g 4   No current facility-administered medications for this visit.      Past Surgical History:  Procedure Laterality Date  . AV FISTULA PLACEMENT     [facility] . CARDIAC VALVE SURGERY  2007  . CAROTID ANGIOGRAM Bilateral 03/06/2013   Procedure: CAROTID ANGIOGRAM;  Surgeon: CElam Dutch MD;  [facility];  Service: Cardiovascular;  Laterality: Bilateral;  . CHOLECYSTECTOMY  1995   Gall Bladder  . CORONARY ARTERY BYPASS GRAFT  05/2006   Off pump LIMA to LAD  . INNER EAR SURGERY    . ORIF PATELLA Right 03/11/2017   Procedure: OPEN REDUCTION INTERNAL (ORIF) FIXATION PATELLA;  Surgeon: SRod Can MD;  [facility]  Service: Orthopedics;  Laterality: Right;  . Plastic Surgery on leg       Allergies  Allergen Reactions  . Augmentin [Amoxicillin-Pot Clavulanate] Nausea And Vomiting    Vomiting      Family History  Problem Relation Age of Onset  . Depression Sister   . Dementia Sister   . OCD Sister   . Diabetes Sister   . Hypertension Sister   . Heart attack Sister   . Bipolar disorder Brother   . Drug abuse Brother   . Diabetes Brother   . Hypertension Brother   . Heart disease Brother   . Hemochromatosis Brother   . Bipolar disorder Other   . ADD / ADHD Other   . Seizures Other   . Bipolar disorder Other   .  Alcohol abuse Other   . Dementia Mother   . Heart disease Mother   . Hypertension Mother   . Heart attack Mother   . Alcohol abuse Father   . Heart disease Father        Heart Disease before age 526 . Hypertension Father   .  Heart attack Father   . Dementia Maternal Aunt   . OCD Sister   . Heart disease Sister        Heart Disease before age 60  . Diabetes Sister   . Hemochromatosis Sister   . Hypertension Sister   . Rheum arthritis Sister   . Heart attack Brother   . COPD Brother   . Heart attack Brother   . Paranoid behavior Neg Hx   . Schizophrenia Neg Hx   . Sexual abuse Neg Hx   . Physical abuse Neg Hx      Social History Sherry Cox reports that she has been smoking cigarettes. She started smoking about 46 years ago. She has a 3.00 pack-year smoking history. She has never used smokeless tobacco. Sherry Cox reports that she does not drink alcohol.   Review of Systems CONSTITUTIONAL: No weight loss, fever, chills, weakness or fatigue.  HEENT: Eyes: No visual loss, blurred vision, double vision or yellow sclerae.No hearing loss, sneezing, congestion, runny nose or sore throat.  SKIN: No rash or itching.  CARDIOVASCULAR: per hpi RESPIRATORY: No shortness of breath, cough or sputum.  GASTROINTESTINAL: No anorexia, nausea, vomiting or diarrhea. No abdominal pain or blood.  GENITOURINARY: No burning on urination, no polyuria NEUROLOGICAL: No headache, dizziness, syncope, paralysis, ataxia, numbness or tingling in the extremities. No change in bowel or bladder control.  MUSCULOSKELETAL: No muscle, back pain, joint pain or stiffness.  LYMPHATICS: No enlarged nodes. No history of splenectomy.  PSYCHIATRIC: No history of depression or anxiety.  ENDOCRINOLOGIC: No reports of sweating, cold or heat intolerance. No polyuria or polydipsia.  Marland Kitchen   Physical Examination Vitals:   06/18/18 0852  BP: (!) 187/74  Pulse: 68  SpO2: 98%   Vitals:   06/18/18 0852  Weight: 175  lb 3.2 oz (79.5 kg)  Height: '4\' 10"'  (1.473 m)    Gen: resting comfortably, no acute distress HEENT: no scleral icterus, pupils equal round and reactive, no palptable cervical adenopathy,  CV: RRR, 3/6 systolic murmur at apex,  Resp: Clear to auscultation bilaterally GI: abdomen is soft, non-tender, non-distended, normal bowel sounds, no hepatosplenomegaly MSK: extremities are warm, no edema.  Skin: warm, no rash Neuro:  no focal deficits Psych: appropriate affect   Diagnostic Studies 04/2011 echo Study Conclusions  - Left ventricle: The cavity size was normal. Systolic function was normal. The estimated ejection fraction was in the range of 60% to 65%. Wall motion was normal; there were no regional wall motion abnormalities. Doppler parameters are consistent with abnormal left ventricular relaxation (grade 1 diastolic dysfunction). - Aortic valve: Valve area: 1.38cm^2(VTI). Valve area: 1.13cm^2 (Vmax).   01/2015 Carotid US RICA 40-59%, left bifurcatoin 40-59%   09/2017 echo Study Conclusions  - Left ventricle: The cavity size was normal. Wall thickness was normal. The estimated ejection fraction was 55%. There is hypokinesis of the basal-midinferolateral and inferior myocardium. The study is not technically sufficient to allow evaluation of LV diastolic function. - Aortic valve: Poorly visualized. Unable to assess leaflet structure. Moderately calcified. - Mitral valve: Mildly to moderately calcified annulus. There was moderate regurgitation. - Left atrium: The atrium was severely dilated. - Right ventricle: The cavity size was mildly dilated. - Right atrium: Central venous pressure (est): 3 mm Hg. - Atrial septum: Apparent secundum ASD with left to right flow by color Doppler. - Tricuspid valve: There was mild regurgitation. - Pulmonary arteries: Systolic pressure was mildly increased. PA peak pressure: 41 mm Hg (S). - Pericardium,  extracardiac: There was no pericardial effusion.   09/2017 carotid US Final Interpretation: Right Carotid: Velocities in the right ICA are consistent with a 40-59%        stenosis. The ECA appears >50% stenosed. Based on peak systolic        velocity and moderate plaque formation.  Left Carotid: Velocities in the left ICA are consistent with a 40-59% stenosis.       The ECA appears >50% stenosed. Upper end of range based on peak       systolic velocity and plaque fromation.  Vertebrals: Right vertebral artery demonstrates antegrade flow. Left vertebral       artery demonstrates bidirectional flow. Left AVF. Subclavians: Bilateral subclavian artery flow was disturbed. Left AVF.    Assessment and Plan  1. CAD -chronic frequent SL NG she established with her previous provider. use for many years that is unchanged. I have not been able to get her to decrease this habit.  - some increase in chest pain primarily brought on by stress, no exertoinal symptoms - continue to monitor.    2. Carotid stenosis - repeat carotid US 09/2018 - continue medical therapy.   3. HTN - above goal. STop lopressor, start coreg 12.52m bid for more potent bp effect  4. Hyperlipidemia -she will continue statin  5. Mitral regurgitation - moderate by last echo, repeat echo 09/2018  6. ASD - probable secundum ASD. Asymptomatic, no clear RV dysfunction or enlargement by my review of her echo - continue to monitor.     JArnoldo Lenis M.D.

## 2018-06-19 ENCOUNTER — Other Ambulatory Visit: Payer: Self-pay | Admitting: Cardiology

## 2018-06-19 DIAGNOSIS — N2581 Secondary hyperparathyroidism of renal origin: Secondary | ICD-10-CM | POA: Diagnosis not present

## 2018-06-19 DIAGNOSIS — D509 Iron deficiency anemia, unspecified: Secondary | ICD-10-CM | POA: Diagnosis not present

## 2018-06-19 DIAGNOSIS — N186 End stage renal disease: Secondary | ICD-10-CM | POA: Diagnosis not present

## 2018-06-19 DIAGNOSIS — Z992 Dependence on renal dialysis: Secondary | ICD-10-CM | POA: Diagnosis not present

## 2018-06-19 DIAGNOSIS — D631 Anemia in chronic kidney disease: Secondary | ICD-10-CM | POA: Diagnosis not present

## 2018-06-21 DIAGNOSIS — D631 Anemia in chronic kidney disease: Secondary | ICD-10-CM | POA: Diagnosis not present

## 2018-06-21 DIAGNOSIS — D509 Iron deficiency anemia, unspecified: Secondary | ICD-10-CM | POA: Diagnosis not present

## 2018-06-21 DIAGNOSIS — N2581 Secondary hyperparathyroidism of renal origin: Secondary | ICD-10-CM | POA: Diagnosis not present

## 2018-06-21 DIAGNOSIS — Z992 Dependence on renal dialysis: Secondary | ICD-10-CM | POA: Diagnosis not present

## 2018-06-21 DIAGNOSIS — N186 End stage renal disease: Secondary | ICD-10-CM | POA: Diagnosis not present

## 2018-06-24 DIAGNOSIS — Z992 Dependence on renal dialysis: Secondary | ICD-10-CM | POA: Diagnosis not present

## 2018-06-24 DIAGNOSIS — N2581 Secondary hyperparathyroidism of renal origin: Secondary | ICD-10-CM | POA: Diagnosis not present

## 2018-06-24 DIAGNOSIS — D631 Anemia in chronic kidney disease: Secondary | ICD-10-CM | POA: Diagnosis not present

## 2018-06-24 DIAGNOSIS — D509 Iron deficiency anemia, unspecified: Secondary | ICD-10-CM | POA: Diagnosis not present

## 2018-06-24 DIAGNOSIS — N186 End stage renal disease: Secondary | ICD-10-CM | POA: Diagnosis not present

## 2018-06-26 DIAGNOSIS — N2581 Secondary hyperparathyroidism of renal origin: Secondary | ICD-10-CM | POA: Diagnosis not present

## 2018-06-26 DIAGNOSIS — D631 Anemia in chronic kidney disease: Secondary | ICD-10-CM | POA: Diagnosis not present

## 2018-06-26 DIAGNOSIS — Z992 Dependence on renal dialysis: Secondary | ICD-10-CM | POA: Diagnosis not present

## 2018-06-26 DIAGNOSIS — D509 Iron deficiency anemia, unspecified: Secondary | ICD-10-CM | POA: Diagnosis not present

## 2018-06-26 DIAGNOSIS — N186 End stage renal disease: Secondary | ICD-10-CM | POA: Diagnosis not present

## 2018-06-28 DIAGNOSIS — N186 End stage renal disease: Secondary | ICD-10-CM | POA: Diagnosis not present

## 2018-06-28 DIAGNOSIS — D509 Iron deficiency anemia, unspecified: Secondary | ICD-10-CM | POA: Diagnosis not present

## 2018-06-28 DIAGNOSIS — D631 Anemia in chronic kidney disease: Secondary | ICD-10-CM | POA: Diagnosis not present

## 2018-06-28 DIAGNOSIS — N2581 Secondary hyperparathyroidism of renal origin: Secondary | ICD-10-CM | POA: Diagnosis not present

## 2018-06-28 DIAGNOSIS — Z992 Dependence on renal dialysis: Secondary | ICD-10-CM | POA: Diagnosis not present

## 2018-06-30 DIAGNOSIS — Z992 Dependence on renal dialysis: Secondary | ICD-10-CM | POA: Diagnosis not present

## 2018-06-30 DIAGNOSIS — N2581 Secondary hyperparathyroidism of renal origin: Secondary | ICD-10-CM | POA: Diagnosis not present

## 2018-06-30 DIAGNOSIS — N186 End stage renal disease: Secondary | ICD-10-CM | POA: Diagnosis not present

## 2018-06-30 DIAGNOSIS — D509 Iron deficiency anemia, unspecified: Secondary | ICD-10-CM | POA: Diagnosis not present

## 2018-06-30 DIAGNOSIS — D631 Anemia in chronic kidney disease: Secondary | ICD-10-CM | POA: Diagnosis not present

## 2018-07-03 DIAGNOSIS — Z992 Dependence on renal dialysis: Secondary | ICD-10-CM | POA: Diagnosis not present

## 2018-07-03 DIAGNOSIS — D509 Iron deficiency anemia, unspecified: Secondary | ICD-10-CM | POA: Diagnosis not present

## 2018-07-03 DIAGNOSIS — N186 End stage renal disease: Secondary | ICD-10-CM | POA: Diagnosis not present

## 2018-07-03 DIAGNOSIS — D631 Anemia in chronic kidney disease: Secondary | ICD-10-CM | POA: Diagnosis not present

## 2018-07-03 DIAGNOSIS — N2581 Secondary hyperparathyroidism of renal origin: Secondary | ICD-10-CM | POA: Diagnosis not present

## 2018-07-05 DIAGNOSIS — D631 Anemia in chronic kidney disease: Secondary | ICD-10-CM | POA: Diagnosis not present

## 2018-07-05 DIAGNOSIS — Z992 Dependence on renal dialysis: Secondary | ICD-10-CM | POA: Diagnosis not present

## 2018-07-05 DIAGNOSIS — N186 End stage renal disease: Secondary | ICD-10-CM | POA: Diagnosis not present

## 2018-07-05 DIAGNOSIS — D509 Iron deficiency anemia, unspecified: Secondary | ICD-10-CM | POA: Diagnosis not present

## 2018-07-05 DIAGNOSIS — N2581 Secondary hyperparathyroidism of renal origin: Secondary | ICD-10-CM | POA: Diagnosis not present

## 2018-07-08 DIAGNOSIS — N186 End stage renal disease: Secondary | ICD-10-CM | POA: Diagnosis not present

## 2018-07-08 DIAGNOSIS — N2581 Secondary hyperparathyroidism of renal origin: Secondary | ICD-10-CM | POA: Diagnosis not present

## 2018-07-08 DIAGNOSIS — Z992 Dependence on renal dialysis: Secondary | ICD-10-CM | POA: Diagnosis not present

## 2018-07-08 DIAGNOSIS — D631 Anemia in chronic kidney disease: Secondary | ICD-10-CM | POA: Diagnosis not present

## 2018-07-08 DIAGNOSIS — D509 Iron deficiency anemia, unspecified: Secondary | ICD-10-CM | POA: Diagnosis not present

## 2018-07-10 DIAGNOSIS — Z992 Dependence on renal dialysis: Secondary | ICD-10-CM | POA: Diagnosis not present

## 2018-07-10 DIAGNOSIS — D631 Anemia in chronic kidney disease: Secondary | ICD-10-CM | POA: Diagnosis not present

## 2018-07-10 DIAGNOSIS — N186 End stage renal disease: Secondary | ICD-10-CM | POA: Diagnosis not present

## 2018-07-10 DIAGNOSIS — N2581 Secondary hyperparathyroidism of renal origin: Secondary | ICD-10-CM | POA: Diagnosis not present

## 2018-07-10 DIAGNOSIS — D509 Iron deficiency anemia, unspecified: Secondary | ICD-10-CM | POA: Diagnosis not present

## 2018-07-11 ENCOUNTER — Other Ambulatory Visit: Payer: Self-pay | Admitting: Family Medicine

## 2018-07-11 ENCOUNTER — Other Ambulatory Visit (HOSPITAL_COMMUNITY): Payer: Self-pay | Admitting: Psychiatry

## 2018-07-12 DIAGNOSIS — Z992 Dependence on renal dialysis: Secondary | ICD-10-CM | POA: Diagnosis not present

## 2018-07-12 DIAGNOSIS — D631 Anemia in chronic kidney disease: Secondary | ICD-10-CM | POA: Diagnosis not present

## 2018-07-12 DIAGNOSIS — N186 End stage renal disease: Secondary | ICD-10-CM | POA: Diagnosis not present

## 2018-07-12 DIAGNOSIS — N2581 Secondary hyperparathyroidism of renal origin: Secondary | ICD-10-CM | POA: Diagnosis not present

## 2018-07-12 DIAGNOSIS — D509 Iron deficiency anemia, unspecified: Secondary | ICD-10-CM | POA: Diagnosis not present

## 2018-07-14 DIAGNOSIS — M545 Low back pain: Secondary | ICD-10-CM | POA: Diagnosis not present

## 2018-07-14 DIAGNOSIS — Z79891 Long term (current) use of opiate analgesic: Secondary | ICD-10-CM | POA: Diagnosis not present

## 2018-07-14 DIAGNOSIS — M130 Polyarthritis, unspecified: Secondary | ICD-10-CM | POA: Diagnosis not present

## 2018-07-14 DIAGNOSIS — M25569 Pain in unspecified knee: Secondary | ICD-10-CM | POA: Diagnosis not present

## 2018-07-15 DIAGNOSIS — N2581 Secondary hyperparathyroidism of renal origin: Secondary | ICD-10-CM | POA: Diagnosis not present

## 2018-07-15 DIAGNOSIS — D509 Iron deficiency anemia, unspecified: Secondary | ICD-10-CM | POA: Diagnosis not present

## 2018-07-15 DIAGNOSIS — Z992 Dependence on renal dialysis: Secondary | ICD-10-CM | POA: Diagnosis not present

## 2018-07-15 DIAGNOSIS — N186 End stage renal disease: Secondary | ICD-10-CM | POA: Diagnosis not present

## 2018-07-15 DIAGNOSIS — D631 Anemia in chronic kidney disease: Secondary | ICD-10-CM | POA: Diagnosis not present

## 2018-07-17 ENCOUNTER — Other Ambulatory Visit: Payer: Self-pay | Admitting: Cardiology

## 2018-07-17 DIAGNOSIS — D631 Anemia in chronic kidney disease: Secondary | ICD-10-CM | POA: Diagnosis not present

## 2018-07-17 DIAGNOSIS — N2581 Secondary hyperparathyroidism of renal origin: Secondary | ICD-10-CM | POA: Diagnosis not present

## 2018-07-17 DIAGNOSIS — Z992 Dependence on renal dialysis: Secondary | ICD-10-CM | POA: Diagnosis not present

## 2018-07-17 DIAGNOSIS — D509 Iron deficiency anemia, unspecified: Secondary | ICD-10-CM | POA: Diagnosis not present

## 2018-07-17 DIAGNOSIS — N186 End stage renal disease: Secondary | ICD-10-CM | POA: Diagnosis not present

## 2018-07-19 DIAGNOSIS — N2581 Secondary hyperparathyroidism of renal origin: Secondary | ICD-10-CM | POA: Diagnosis not present

## 2018-07-19 DIAGNOSIS — D509 Iron deficiency anemia, unspecified: Secondary | ICD-10-CM | POA: Diagnosis not present

## 2018-07-19 DIAGNOSIS — D631 Anemia in chronic kidney disease: Secondary | ICD-10-CM | POA: Diagnosis not present

## 2018-07-19 DIAGNOSIS — N186 End stage renal disease: Secondary | ICD-10-CM | POA: Diagnosis not present

## 2018-07-19 DIAGNOSIS — Z992 Dependence on renal dialysis: Secondary | ICD-10-CM | POA: Diagnosis not present

## 2018-07-21 ENCOUNTER — Ambulatory Visit (INDEPENDENT_AMBULATORY_CARE_PROVIDER_SITE_OTHER): Payer: Medicare Other | Admitting: Psychiatry

## 2018-07-21 DIAGNOSIS — F331 Major depressive disorder, recurrent, moderate: Secondary | ICD-10-CM | POA: Diagnosis not present
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
## 2018-07-22 DIAGNOSIS — D631 Anemia in chronic kidney disease: Secondary | ICD-10-CM | POA: Diagnosis not present

## 2018-07-22 DIAGNOSIS — N186 End stage renal disease: Secondary | ICD-10-CM | POA: Diagnosis not present

## 2018-07-22 DIAGNOSIS — D509 Iron deficiency anemia, unspecified: Secondary | ICD-10-CM | POA: Diagnosis not present

## 2018-07-22 DIAGNOSIS — Z992 Dependence on renal dialysis: Secondary | ICD-10-CM | POA: Diagnosis not present

## 2018-07-22 DIAGNOSIS — N2581 Secondary hyperparathyroidism of renal origin: Secondary | ICD-10-CM | POA: Diagnosis not present

## 2018-07-24 DIAGNOSIS — Z992 Dependence on renal dialysis: Secondary | ICD-10-CM | POA: Diagnosis not present

## 2018-07-24 DIAGNOSIS — N186 End stage renal disease: Secondary | ICD-10-CM | POA: Diagnosis not present

## 2018-07-24 DIAGNOSIS — D631 Anemia in chronic kidney disease: Secondary | ICD-10-CM | POA: Diagnosis not present

## 2018-07-24 DIAGNOSIS — D509 Iron deficiency anemia, unspecified: Secondary | ICD-10-CM | POA: Diagnosis not present

## 2018-07-24 DIAGNOSIS — N2581 Secondary hyperparathyroidism of renal origin: Secondary | ICD-10-CM | POA: Diagnosis not present

## 2018-07-26 DIAGNOSIS — Z992 Dependence on renal dialysis: Secondary | ICD-10-CM | POA: Diagnosis not present

## 2018-07-26 DIAGNOSIS — N2581 Secondary hyperparathyroidism of renal origin: Secondary | ICD-10-CM | POA: Diagnosis not present

## 2018-07-26 DIAGNOSIS — D509 Iron deficiency anemia, unspecified: Secondary | ICD-10-CM | POA: Diagnosis not present

## 2018-07-26 DIAGNOSIS — N186 End stage renal disease: Secondary | ICD-10-CM | POA: Diagnosis not present

## 2018-07-26 DIAGNOSIS — D631 Anemia in chronic kidney disease: Secondary | ICD-10-CM | POA: Diagnosis not present

## 2018-07-29 DIAGNOSIS — Z992 Dependence on renal dialysis: Secondary | ICD-10-CM | POA: Diagnosis not present

## 2018-07-29 DIAGNOSIS — D509 Iron deficiency anemia, unspecified: Secondary | ICD-10-CM | POA: Diagnosis not present

## 2018-07-29 DIAGNOSIS — N2581 Secondary hyperparathyroidism of renal origin: Secondary | ICD-10-CM | POA: Diagnosis not present

## 2018-07-29 DIAGNOSIS — D631 Anemia in chronic kidney disease: Secondary | ICD-10-CM | POA: Diagnosis not present

## 2018-07-29 DIAGNOSIS — N186 End stage renal disease: Secondary | ICD-10-CM | POA: Diagnosis not present

## 2018-07-31 DIAGNOSIS — Z992 Dependence on renal dialysis: Secondary | ICD-10-CM | POA: Diagnosis not present

## 2018-07-31 DIAGNOSIS — D509 Iron deficiency anemia, unspecified: Secondary | ICD-10-CM | POA: Diagnosis not present

## 2018-07-31 DIAGNOSIS — N2581 Secondary hyperparathyroidism of renal origin: Secondary | ICD-10-CM | POA: Diagnosis not present

## 2018-07-31 DIAGNOSIS — N186 End stage renal disease: Secondary | ICD-10-CM | POA: Diagnosis not present

## 2018-07-31 DIAGNOSIS — D631 Anemia in chronic kidney disease: Secondary | ICD-10-CM | POA: Diagnosis not present

## 2018-08-02 DIAGNOSIS — N2581 Secondary hyperparathyroidism of renal origin: Secondary | ICD-10-CM | POA: Diagnosis not present

## 2018-08-02 DIAGNOSIS — N186 End stage renal disease: Secondary | ICD-10-CM | POA: Diagnosis not present

## 2018-08-02 DIAGNOSIS — Z992 Dependence on renal dialysis: Secondary | ICD-10-CM | POA: Diagnosis not present

## 2018-08-02 DIAGNOSIS — D631 Anemia in chronic kidney disease: Secondary | ICD-10-CM | POA: Diagnosis not present

## 2018-08-02 DIAGNOSIS — D509 Iron deficiency anemia, unspecified: Secondary | ICD-10-CM | POA: Diagnosis not present

## 2018-08-05 DIAGNOSIS — D509 Iron deficiency anemia, unspecified: Secondary | ICD-10-CM | POA: Diagnosis not present

## 2018-08-05 DIAGNOSIS — D631 Anemia in chronic kidney disease: Secondary | ICD-10-CM | POA: Diagnosis not present

## 2018-08-05 DIAGNOSIS — Z992 Dependence on renal dialysis: Secondary | ICD-10-CM | POA: Diagnosis not present

## 2018-08-05 DIAGNOSIS — N186 End stage renal disease: Secondary | ICD-10-CM | POA: Diagnosis not present

## 2018-08-05 DIAGNOSIS — N2581 Secondary hyperparathyroidism of renal origin: Secondary | ICD-10-CM | POA: Diagnosis not present

## 2018-08-07 DIAGNOSIS — N2581 Secondary hyperparathyroidism of renal origin: Secondary | ICD-10-CM | POA: Diagnosis not present

## 2018-08-07 DIAGNOSIS — D509 Iron deficiency anemia, unspecified: Secondary | ICD-10-CM | POA: Diagnosis not present

## 2018-08-07 DIAGNOSIS — Z992 Dependence on renal dialysis: Secondary | ICD-10-CM | POA: Diagnosis not present

## 2018-08-07 DIAGNOSIS — D631 Anemia in chronic kidney disease: Secondary | ICD-10-CM | POA: Diagnosis not present

## 2018-08-07 DIAGNOSIS — N186 End stage renal disease: Secondary | ICD-10-CM | POA: Diagnosis not present

## 2018-08-08 ENCOUNTER — Ambulatory Visit (HOSPITAL_COMMUNITY): Payer: Medicare Other | Admitting: Psychiatry

## 2018-08-08 DIAGNOSIS — Z992 Dependence on renal dialysis: Secondary | ICD-10-CM | POA: Diagnosis not present

## 2018-08-08 DIAGNOSIS — N186 End stage renal disease: Secondary | ICD-10-CM | POA: Diagnosis not present

## 2018-08-09 DIAGNOSIS — N2581 Secondary hyperparathyroidism of renal origin: Secondary | ICD-10-CM | POA: Diagnosis not present

## 2018-08-09 DIAGNOSIS — N186 End stage renal disease: Secondary | ICD-10-CM | POA: Diagnosis not present

## 2018-08-09 DIAGNOSIS — D509 Iron deficiency anemia, unspecified: Secondary | ICD-10-CM | POA: Diagnosis not present

## 2018-08-09 DIAGNOSIS — Z992 Dependence on renal dialysis: Secondary | ICD-10-CM | POA: Diagnosis not present

## 2018-08-09 DIAGNOSIS — D631 Anemia in chronic kidney disease: Secondary | ICD-10-CM | POA: Diagnosis not present

## 2018-08-11 ENCOUNTER — Ambulatory Visit (INDEPENDENT_AMBULATORY_CARE_PROVIDER_SITE_OTHER): Payer: Medicare Other | Admitting: Psychiatry

## 2018-08-11 ENCOUNTER — Encounter (HOSPITAL_COMMUNITY): Payer: Self-pay | Admitting: Psychiatry

## 2018-08-11 DIAGNOSIS — F331 Major depressive disorder, recurrent, moderate: Secondary | ICD-10-CM | POA: Diagnosis not present

## 2018-08-11 MED ORDER — DULOXETINE HCL 60 MG PO CPEP: 60.0000 mg | ORAL_CAPSULE | Freq: Every day | ORAL | 2 refills | Status: DC

## 2018-08-11 MED ORDER — SERTRALINE HCL 100 MG PO TABS: 100.0000 mg | ORAL_TABLET | Freq: Every day | ORAL | 3 refills | Status: DC

## 2018-08-11 MED ORDER — CLONAZEPAM 0.5 MG PO TABS: 0.5000 mg | ORAL_TABLET | Freq: Four times a day (QID) | ORAL | 2 refills | Status: DC
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
## 2018-08-12 DIAGNOSIS — N186 End stage renal disease: Secondary | ICD-10-CM | POA: Diagnosis not present

## 2018-08-12 DIAGNOSIS — N2581 Secondary hyperparathyroidism of renal origin: Secondary | ICD-10-CM | POA: Diagnosis not present

## 2018-08-12 DIAGNOSIS — Z992 Dependence on renal dialysis: Secondary | ICD-10-CM | POA: Diagnosis not present

## 2018-08-12 DIAGNOSIS — D631 Anemia in chronic kidney disease: Secondary | ICD-10-CM | POA: Diagnosis not present

## 2018-08-12 DIAGNOSIS — D509 Iron deficiency anemia, unspecified: Secondary | ICD-10-CM | POA: Diagnosis not present

## 2018-08-13 ENCOUNTER — Encounter: Payer: Self-pay | Admitting: Family Medicine

## 2018-08-13 ENCOUNTER — Ambulatory Visit (INDEPENDENT_AMBULATORY_CARE_PROVIDER_SITE_OTHER): Payer: Medicare Other | Admitting: Family Medicine

## 2018-08-13 VITALS — BP 151/72 | HR 66 | Temp 96.9°F | Ht <= 58 in | Wt 165.0 lb

## 2018-08-13 DIAGNOSIS — N186 End stage renal disease: Secondary | ICD-10-CM | POA: Diagnosis not present

## 2018-08-13 DIAGNOSIS — H1013 Acute atopic conjunctivitis, bilateral: Secondary | ICD-10-CM

## 2018-08-13 DIAGNOSIS — J3089 Other allergic rhinitis: Secondary | ICD-10-CM | POA: Diagnosis not present

## 2018-08-13 DIAGNOSIS — I120 Hypertensive chronic kidney disease with stage 5 chronic kidney disease or end stage renal disease: Secondary | ICD-10-CM

## 2018-08-13 MED ORDER — AZELASTINE HCL 0.1 % NA SOLN: 2.0000 | Freq: Two times a day (BID) | NASAL | 12 refills | Status: AC

## 2018-08-13 MED ORDER — OLOPATADINE HCL 0.1 % OP SOLN: 1.0000 [drp] | Freq: Two times a day (BID) | OPHTHALMIC | 12 refills | Status: AC

## 2018-08-13 NOTE — Patient Instructions (Signed)
We discussed that your blood pressure should be below 140/90.  I want you to monitor your before and after hemodialysis blood pressures and bring these results to the office with you in 1 month.  I am going to reach out to your kidney specialist to talk about adding blood pressure medication to your regimen.  We want to reduce your risk of cardiovascular disease.  Make sure that you are taking your blood pressure medicine every day as directed.  Do not forget doses.  For your eye symptoms, I have added an eyedrop called Patanol that you can use in each eye twice daily if needed  I have also changed her nasal spray to Astelin.  Use 2 sprays in each nostril up to 2 times daily if needed for runny nose or drainage.

## 2018-08-13 NOTE — Progress Notes (Signed)
Subjective: CC: Watery eyes PCP: Sherry Norlander, DO Sherry Cox is a 58 y.o. female presenting to clinic today for:  1.  Watery eyes Patient reports issue with excessive tearing and nasal drainage.  She reports use of Flonase nasal spray but does not feel that this especially helps.  She has had some nasal bleeding after use and occasionally has even use the spray twice daily despite instructions saying to use it once daily.  No fevers, purulence.  She does not use any eyedrops but has had a history of dry eye in the past.  2.  Hypertension Patient reports that blood pressures tend to be in the 140s over 70s after hemodialysis sessions.  She has occasionally had to be taken off of the hemodialysis early because of hypotension but notes that this is rare.  She continues to see [facility] for renal.  She has hemodialysis 3 times per week.  Reports that swelling is improving now that her thyroid level has improved.  No chest pain, shortness of breath.   ROS: Per HPI  Allergies  Allergen Reactions  . Augmentin [Amoxicillin-Pot Clavulanate] Nausea And Vomiting    Vomiting   Past Medical History:  Diagnosis Date  . Ankle injury   . Anxiety   . Anxiety disorder   . CAD (coronary artery disease)    Total RCA, stress echo [facility] 11/2008-no ischemia, EF 55%; h/o CABG  . Carotid artery stenosis    12/10/2008 doppler 37-90% RICA, 2-40% LICA/see evaluation by Dr. Oneida Alar 2011  . Ejection fraction    EF 60-65%, echo, June, 2009  . ESRD on dialysis [facility])    Transplant consideration  . Hemochromatosis   . Hx of CABG    Off pump LIMA to LAD  05/2006  . Hx of tobacco use, presenting hazards to health 04/26/2012  . Hyperlipidemia   . Irregular heart beat   . Murmur    October, 2012  . Neuropathy   . Overweight(278.02)   . Tobacco abuse     Current Outpatient Medications:  .  amLODipine (NORVASC) 10 MG tablet, TAKE ONE TABLET BY MOUTH DAILY., Disp: 90 tablet, Rfl: 3 .  aspirin EC  81 MG tablet, Take 1 tablet (81 mg total) by mouth daily., Disp: , Rfl:  .  atorvastatin (LIPITOR) 80 MG tablet, TAKE ONE TABLET BY MOUTH DAILY., Disp: 90 tablet, Rfl: 1 .  carvedilol (COREG) 12.5 MG tablet, Take 1 tablet (12.5 mg total) by mouth 2 (two) times daily., Disp: 60 tablet, Rfl: 6 .  clonazePAM (KLONOPIN) 0.5 MG tablet, Take 1 tablet (0.5 mg total) by mouth 4 (four) times daily., Disp: 120 tablet, Rfl: 2 .  DULoxetine (CYMBALTA) 60 MG capsule, Take 1 capsule (60 mg total) by mouth daily., Disp: 30 capsule, Rfl: 2 .  fluticasone (FLONASE) 50 MCG/ACT nasal spray, USE 2 SPRAYS IN EACH NOSTRIL ONCE DAILY. SHAKE GENTLY BEFORE USING., Disp: 16 g, Rfl: 4 .  folic acid (FOLVITE) 1 MG tablet, TAKE ONE TABLET BY MOUTH DAILY., Disp: 90 tablet, Rfl: 1 .  folic acid-vitamin b complex-vitamin c-selenium-zinc (DIALYVITE) 3 MG TABS, Take 1 tablet by mouth daily.  , Disp: , Rfl:  .  HYDROcodone-acetaminophen (NORCO) 5-325 MG tablet, Take 1 tablet by mouth 2 (two) times daily as needed for moderate pain., Disp: 90 tablet, Rfl: 0 .  ibuprofen (ADVIL,MOTRIN) 200 MG tablet, Take 400 mg by mouth every 6 (six) hours as needed for mild pain., Disp: , Rfl:  .  levothyroxine (SYNTHROID,  LEVOTHROID) 150 MCG tablet, TAKE ONE TABLET BY MOUTH DAILY., Disp: 30 tablet, Rfl: 10 .  meloxicam (MOBIC) 7.5 MG tablet, Take 7.5 mg by mouth daily., Disp: , Rfl: 1 .  metoprolol tartrate (LOPRESSOR) 50 MG tablet, TAKE ONE TABLET BY MOUTH TWICE DAILY ON MONDAY, WEDNESDAY AND FRIDAY. TAKE ONE TABLET ON REMAINING DAYS., Disp: , Rfl:  .  NARCAN 4 MG/0.1ML LIQD nasal spray kit, Place 1 spray into the nose once as needed. overdose, Disp: , Rfl: 0 .  nitroGLYCERIN (NITROSTAT) 0.4 MG SL tablet, DISSOLVE ONE TABLET UNDER TONGUE EVERY 5 MINUTES UP TO 3 DOSES AS NEEDED FOR CHEST PAIN, Disp: 25 tablet, Rfl: 3 .  OXYGEN, Inhale into the lungs. On 2, Disp: , Rfl:  .  sertraline (ZOLOFT) 100 MG tablet, Take 1 tablet (100 mg total) by mouth  daily., Disp: 90 tablet, Rfl: 3 .  sevelamer (RENVELA) 800 MG tablet, Take 1,600-4,000 mg by mouth as directed. Take 5 tablets (409m) by mouth with meals and 1600 tablets by mouth with snacks., Disp: , Rfl:  .  sodium bicarbonate 650 MG tablet, Take 650 mg by mouth 2 (two) times daily. , Disp: , Rfl:  .  sodium chloride (OCEAN) 0.65 % SOLN nasal spray, Place 1 spray into both nostrils as needed for congestion., Disp: , Rfl:  .  SYMBICORT 160-4.5 MCG/ACT inhaler, INHALE TWO PUFFS INTO THE LUNGS TWICE DAILY., Disp: 10.2 g, Rfl: 0 .  traMADol (ULTRAM) 50 MG tablet, take one at bedtime, Disp: , Rfl: 0 .  VENTOLIN HFA 108 (90 Base) MCG/ACT inhaler, INHALE TWO PUFFS INTO THE LUNGS EVERY 6 HOURS AS NEEDED FOR WHEEZING OR SHORTNESS OF BREATH., Disp: 18 g, Rfl: 4 Social History   Socioeconomic History  . Marital status: Married    Spouse name: Not on file  . Number of children: Not on file  . Years of education: Not on file  . Highest education level: Not on file  Occupational History  . Occupation: DISABLED  Social Needs  . Financial resource strain: Not on file  . Food insecurity:    Worry: Not on file    Inability: Not on file  . Transportation needs:    Medical: Not on file    Non-medical: Not on file  Tobacco Use  . Smoking status: Current Some Day Smoker    Packs/day: 0.10    Years: 30.00    Pack years: 3.00    Types: Cigarettes    Start date: 02/02/1972  . Smokeless tobacco: Never Used  Substance and Sexual Activity  . Alcohol use: No    Alcohol/week: 0.0 standard drinks  . Drug use: No  . Sexual activity: Yes  Lifestyle  . Physical activity:    Days per week: Not on file    Minutes per session: Not on file  . Stress: Not on file  Relationships  . Social connections:    Talks on phone: Not on file    Gets together: Not on file    Attends religious service: Not on file    Active member of club or organization: Not on file    Attends meetings of clubs or organizations:  Not on file    Relationship status: Not on file  . Intimate partner violence:    Fear of current or ex partner: Not on file    Emotionally abused: Not on file    Physically abused: Not on file    Forced sexual activity: Not on file  Other  Topics Concern  . Not on file  Social History Narrative  . Not on file   Family History  Problem Relation Age of Onset  . Depression Sister   . Dementia Sister   . OCD Sister   . Diabetes Sister   . Hypertension Sister   . Heart attack Sister   . Bipolar disorder Brother   . Drug abuse Brother   . Diabetes Brother   . Hypertension Brother   . Heart disease Brother   . Hemochromatosis Brother   . Bipolar disorder Other   . ADD / ADHD Other   . Seizures Other   . Bipolar disorder Other   . Alcohol abuse Other   . Dementia Mother   . Heart disease Mother   . Hypertension Mother   . Heart attack Mother   . Alcohol abuse Father   . Heart disease Father        Heart Disease before age 13  . Hypertension Father   . Heart attack Father   . Dementia Maternal Aunt   . OCD Sister   . Heart disease Sister        Heart Disease before age 66  . Diabetes Sister   . Hemochromatosis Sister   . Hypertension Sister   . Rheum arthritis Sister   . Heart attack Brother   . COPD Brother   . Heart attack Brother   . Paranoid behavior Neg Hx   . Schizophrenia Neg Hx   . Sexual abuse Neg Hx   . Physical abuse Neg Hx     Objective: Office vital signs reviewed. BP (!) 151/72   Pulse 66   Temp (!) 96.9 F (36.1 C) (Oral)   Ht '4\' 10"'  (1.473 m)   Wt 165 lb (74.8 kg)   BMI 34.49 kg/m   Physical Examination:  General: Awake, alert, No acute distress HEENT: Normal, MMM, sclera white; some tearing of eyes noted. No substantial nasal discharge appreciated. Cardio: regular rate and rhythm, S1S2 heard, soft systolic murmur at LSB; AVF present in LUE. Pulm: clear to auscultation bilaterally, no wheezes, rhonchi or rales; normal work of breathing on  room air  Assessment/ Plan: 58 y.o. female   1. Benign hypertension with ESRD (end-stage renal disease) ([place]) Not at goal.  We discussed that goal is less than 140/90 for age and comorbidity.  I will reach out to Dr. Lowanda Foster to see if he has any recommendations as to what medications we might add to better help her blood pressures.  She is on max dose of Norvasc 10 mg.  She does continue to have some urine output, I wonder if adding hydrochlorothiazide would be beneficial.  We will reach out to him for suggestions.  Alternatively, could add hydralazine.  She will monitor pre and post HD BPs and return in 1 month for recheck.  2. Non-seasonal allergic rhinitis, unspecified trigger Astelin nasal spray to replace Flonase  3. Allergic conjunctivitis of both eyes Add patanol BID.  Follow up prn.   No orders of the defined types were placed in this encounter.  Meds ordered this encounter  Medications  . olopatadine (PATANOL) 0.1 % ophthalmic solution    Sig: Place 1 drop into both eyes 2 (two) times daily.    Dispense:  5 mL    Refill:  12  . azelastine (ASTELIN) 0.1 % nasal spray    Sig: Place 2 sprays into both nostrils 2 (two) times daily. Use in each nostril as directed  Dispense:  30 mL    Refill:  [place], [place] [phone]

## 2018-08-14 DIAGNOSIS — N2581 Secondary hyperparathyroidism of renal origin: Secondary | ICD-10-CM | POA: Diagnosis not present

## 2018-08-14 DIAGNOSIS — N186 End stage renal disease: Secondary | ICD-10-CM | POA: Diagnosis not present

## 2018-08-14 DIAGNOSIS — D631 Anemia in chronic kidney disease: Secondary | ICD-10-CM | POA: Diagnosis not present

## 2018-08-14 DIAGNOSIS — Z992 Dependence on renal dialysis: Secondary | ICD-10-CM | POA: Diagnosis not present

## 2018-08-14 DIAGNOSIS — D509 Iron deficiency anemia, unspecified: Secondary | ICD-10-CM | POA: Diagnosis not present

## 2018-08-16 DIAGNOSIS — N186 End stage renal disease: Secondary | ICD-10-CM | POA: Diagnosis not present

## 2018-08-16 DIAGNOSIS — D509 Iron deficiency anemia, unspecified: Secondary | ICD-10-CM | POA: Diagnosis not present

## 2018-08-16 DIAGNOSIS — D631 Anemia in chronic kidney disease: Secondary | ICD-10-CM | POA: Diagnosis not present

## 2018-08-16 DIAGNOSIS — N2581 Secondary hyperparathyroidism of renal origin: Secondary | ICD-10-CM | POA: Diagnosis not present

## 2018-08-16 DIAGNOSIS — Z992 Dependence on renal dialysis: Secondary | ICD-10-CM | POA: Diagnosis not present

## 2018-08-19 DIAGNOSIS — N2581 Secondary hyperparathyroidism of renal origin: Secondary | ICD-10-CM | POA: Diagnosis not present

## 2018-08-19 DIAGNOSIS — Z992 Dependence on renal dialysis: Secondary | ICD-10-CM | POA: Diagnosis not present

## 2018-08-19 DIAGNOSIS — N186 End stage renal disease: Secondary | ICD-10-CM | POA: Diagnosis not present

## 2018-08-19 DIAGNOSIS — D509 Iron deficiency anemia, unspecified: Secondary | ICD-10-CM | POA: Diagnosis not present

## 2018-08-19 DIAGNOSIS — D631 Anemia in chronic kidney disease: Secondary | ICD-10-CM | POA: Diagnosis not present

## 2018-08-21 DIAGNOSIS — Z992 Dependence on renal dialysis: Secondary | ICD-10-CM | POA: Diagnosis not present

## 2018-08-21 DIAGNOSIS — N186 End stage renal disease: Secondary | ICD-10-CM | POA: Diagnosis not present

## 2018-08-21 DIAGNOSIS — D631 Anemia in chronic kidney disease: Secondary | ICD-10-CM | POA: Diagnosis not present

## 2018-08-21 DIAGNOSIS — N2581 Secondary hyperparathyroidism of renal origin: Secondary | ICD-10-CM | POA: Diagnosis not present

## 2018-08-21 DIAGNOSIS — D509 Iron deficiency anemia, unspecified: Secondary | ICD-10-CM | POA: Diagnosis not present

## 2018-08-22 ENCOUNTER — Ambulatory Visit (INDEPENDENT_AMBULATORY_CARE_PROVIDER_SITE_OTHER): Payer: Medicare Other | Admitting: Psychiatry

## 2018-08-22 DIAGNOSIS — F411 Generalized anxiety disorder: Secondary | ICD-10-CM | POA: Diagnosis not present

## 2018-08-22 DIAGNOSIS — F331 Major depressive disorder, recurrent, moderate: Secondary | ICD-10-CM

## 2018-08-22 NOTE — Progress Notes (Signed)
   THERAPIST PROGRESS NOTE  Session Time: Friday 08/22/2018 10:10 AM - 10:55 AM   Participation Level: Active  Behavioral Response: CasualDrowsy and LethargicDepressed  Type of Therapy: Individual Therapy  Treatment Goals addressed: Begin healthy grieving process  Interventions: Supportive/grief therapy  Summary: Sherry Cox is a 58 y.o. female who is referred for services by [facility] due to experiencing symptoms related to grief and loss issues. Patient denies any psychiatric hospitalizations. She is a returning patient to this clinician as she has been treated in the the past for depression and anxiety. She continues to see psychiatrist Dr. Harrington Challenger for medication management. Patient reports her oldest sister died in 05/05/18. She has only one remaining sibling left. She states she is not dealing with sister's death. She says she has just pushed it away and acted like sister has gone somewhere. She says she hasn't really cried. She reports being very emotional and angry. She says she has staring spells. She doesn't talk much to anybody anymore and not being as interested in doing things like cooking. She states feeling as though she doesn't have a life anymore.   Patient last was seen a month ago. She reports no change in symptoms since last session. She continues to report isolative behaviors, anger, poor concentration, decreased interest in activities, and isolative behaviors. She states trying to overlook sister's death. Tomorrow is the 19th anniversary of her mother's death and this hs triggered increased memories and sadness about the deaths of her parents and her siblings.   Suicidal/Homicidal: Nowithout intent/plan  Therapist Response: Established rapport, facilitated patient sharing narrative of sister's death, discussed rituals patient participated in, facilitated expression of thoughts and feelings, validated feelings, provided psychoeducation on the grief process (acute  grief/complicated grief/integrated grief), discussed how avoidance of feelings may affect grief process/depression/anxiety, explored and assisted patient identify ways she has avoided her feelings  Plan: Return again in 2 weeks.  Diagnosis: Axis I: MDD, Recurrent, Moderate    Axis II: No diagnosis    Alonza Smoker, LCSW 08/22/2018

## 2018-08-23 DIAGNOSIS — N2581 Secondary hyperparathyroidism of renal origin: Secondary | ICD-10-CM | POA: Diagnosis not present

## 2018-08-23 DIAGNOSIS — N186 End stage renal disease: Secondary | ICD-10-CM | POA: Diagnosis not present

## 2018-08-23 DIAGNOSIS — D631 Anemia in chronic kidney disease: Secondary | ICD-10-CM | POA: Diagnosis not present

## 2018-08-23 DIAGNOSIS — Z992 Dependence on renal dialysis: Secondary | ICD-10-CM | POA: Diagnosis not present

## 2018-08-23 DIAGNOSIS — D509 Iron deficiency anemia, unspecified: Secondary | ICD-10-CM | POA: Diagnosis not present

## 2018-08-25 ENCOUNTER — Other Ambulatory Visit: Payer: Self-pay | Admitting: Family Medicine

## 2018-08-25 ENCOUNTER — Other Ambulatory Visit: Payer: Self-pay | Admitting: Cardiology

## 2018-08-26 DIAGNOSIS — N186 End stage renal disease: Secondary | ICD-10-CM | POA: Diagnosis not present

## 2018-08-26 DIAGNOSIS — D631 Anemia in chronic kidney disease: Secondary | ICD-10-CM | POA: Diagnosis not present

## 2018-08-26 DIAGNOSIS — Z992 Dependence on renal dialysis: Secondary | ICD-10-CM | POA: Diagnosis not present

## 2018-08-26 DIAGNOSIS — N2581 Secondary hyperparathyroidism of renal origin: Secondary | ICD-10-CM | POA: Diagnosis not present

## 2018-08-26 DIAGNOSIS — D509 Iron deficiency anemia, unspecified: Secondary | ICD-10-CM | POA: Diagnosis not present

## 2018-08-28 DIAGNOSIS — N186 End stage renal disease: Secondary | ICD-10-CM | POA: Diagnosis not present

## 2018-08-28 DIAGNOSIS — D631 Anemia in chronic kidney disease: Secondary | ICD-10-CM | POA: Diagnosis not present

## 2018-08-28 DIAGNOSIS — Z992 Dependence on renal dialysis: Secondary | ICD-10-CM | POA: Diagnosis not present

## 2018-08-28 DIAGNOSIS — D509 Iron deficiency anemia, unspecified: Secondary | ICD-10-CM | POA: Diagnosis not present

## 2018-08-28 DIAGNOSIS — N2581 Secondary hyperparathyroidism of renal origin: Secondary | ICD-10-CM | POA: Diagnosis not present

## 2018-08-30 DIAGNOSIS — N2581 Secondary hyperparathyroidism of renal origin: Secondary | ICD-10-CM | POA: Diagnosis not present

## 2018-08-30 DIAGNOSIS — Z992 Dependence on renal dialysis: Secondary | ICD-10-CM | POA: Diagnosis not present

## 2018-08-30 DIAGNOSIS — N186 End stage renal disease: Secondary | ICD-10-CM | POA: Diagnosis not present

## 2018-08-30 DIAGNOSIS — D631 Anemia in chronic kidney disease: Secondary | ICD-10-CM | POA: Diagnosis not present

## 2018-08-30 DIAGNOSIS — D509 Iron deficiency anemia, unspecified: Secondary | ICD-10-CM | POA: Diagnosis not present

## 2018-09-02 DIAGNOSIS — Z992 Dependence on renal dialysis: Secondary | ICD-10-CM | POA: Diagnosis not present

## 2018-09-02 DIAGNOSIS — D509 Iron deficiency anemia, unspecified: Secondary | ICD-10-CM | POA: Diagnosis not present

## 2018-09-02 DIAGNOSIS — D631 Anemia in chronic kidney disease: Secondary | ICD-10-CM | POA: Diagnosis not present

## 2018-09-02 DIAGNOSIS — N2581 Secondary hyperparathyroidism of renal origin: Secondary | ICD-10-CM | POA: Diagnosis not present

## 2018-09-02 DIAGNOSIS — N186 End stage renal disease: Secondary | ICD-10-CM | POA: Diagnosis not present

## 2018-09-04 DIAGNOSIS — N2581 Secondary hyperparathyroidism of renal origin: Secondary | ICD-10-CM | POA: Diagnosis not present

## 2018-09-04 DIAGNOSIS — D509 Iron deficiency anemia, unspecified: Secondary | ICD-10-CM | POA: Diagnosis not present

## 2018-09-04 DIAGNOSIS — N186 End stage renal disease: Secondary | ICD-10-CM | POA: Diagnosis not present

## 2018-09-04 DIAGNOSIS — Z992 Dependence on renal dialysis: Secondary | ICD-10-CM | POA: Diagnosis not present

## 2018-09-04 DIAGNOSIS — D631 Anemia in chronic kidney disease: Secondary | ICD-10-CM | POA: Diagnosis not present

## 2018-09-05 ENCOUNTER — Ambulatory Visit (INDEPENDENT_AMBULATORY_CARE_PROVIDER_SITE_OTHER): Payer: Medicare Other | Admitting: Psychiatry

## 2018-09-05 DIAGNOSIS — F331 Major depressive disorder, recurrent, moderate: Secondary | ICD-10-CM | POA: Diagnosis not present

## 2018-09-05 NOTE — Progress Notes (Signed)
   THERAPIST PROGRESS NOTE  Session Time: Friday 09/05/2018 10:12 AM - 10:55 AM   Participation Level: Active  Behavioral Response: Casual/alert, talkative, less depresed  Type of Therapy: Individual Therapy  Treatment Goals addressed: Begin healthy grieving process  Interventions: Supportive/grief therapy  Summary: Sherry Cox is a 58 y.o. female who is referred for services by [facility] due to experiencing symptoms related to grief and loss issues. Patient denies any psychiatric hospitalizations. She is a returning patient to this clinician as she has been treated in the the past for depression and anxiety. She continues to see psychiatrist Dr. Harrington Challenger for medication management. Patient reports her oldest sister died in 05-12-18. She has only one remaining sibling left. She states she is not dealing with sister's death. She says she has just pushed it away and acted like sister has gone somewhere. She says she hasn't really cried. She reports being very emotional and angry. She says she has staring spells. She doesn't talk much to anybody anymore and not being as interested in doing things like cooking. She states feeling as though she doesn't have a life anymore.   Patient last was seen two weeks ago. She reports less depressed mood and resuming interest/involvement in activities. She has completed craft projects at home and doing leg exercises within her capability. She reports improved appetite and has resumed cooking meals. She also has resumed interest in music and reports using Karaoke machine at home as well as playing the harmonica. She reports coping well with anniversary of mother's death. She reports crying and acknowledging loss and talking with her support system. She reports talking more to remaining sister about their deceased sister and says this helps. She also reports limiting her contact with deceased sister's adult children has helped as they tend to burden her with their  problems. She reports no longer trying to pretend sister is away on vacation.   Suicidal/Homicidal: Nowithout intent/plan  Therapist Response: Reviewed symptoms, praised and reinforced patient's increased involvement in activities, provided psychoeducation on stages of grief, began to assist patient identify her experience with each stage, facilitated patient identifying and expressing feelings of anger regarding sister's death, assigned patient to review handouts and complete handout on her experience with each stage of grief, bring to next session  Plan: Return again in 2 weeks.  Diagnosis: Axis I: MDD, Recurrent, Moderate    Axis II: No diagnosis    Alonza Smoker, LCSW 09/05/2018

## 2018-09-06 DIAGNOSIS — N186 End stage renal disease: Secondary | ICD-10-CM | POA: Diagnosis not present

## 2018-09-06 DIAGNOSIS — N2581 Secondary hyperparathyroidism of renal origin: Secondary | ICD-10-CM | POA: Diagnosis not present

## 2018-09-06 DIAGNOSIS — Z992 Dependence on renal dialysis: Secondary | ICD-10-CM | POA: Diagnosis not present

## 2018-09-06 DIAGNOSIS — D509 Iron deficiency anemia, unspecified: Secondary | ICD-10-CM | POA: Diagnosis not present

## 2018-09-06 DIAGNOSIS — D631 Anemia in chronic kidney disease: Secondary | ICD-10-CM | POA: Diagnosis not present

## 2018-09-09 ENCOUNTER — Other Ambulatory Visit: Payer: Self-pay | Admitting: Family Medicine

## 2018-09-09 DIAGNOSIS — D509 Iron deficiency anemia, unspecified: Secondary | ICD-10-CM | POA: Diagnosis not present

## 2018-09-09 DIAGNOSIS — N186 End stage renal disease: Secondary | ICD-10-CM | POA: Diagnosis not present

## 2018-09-09 DIAGNOSIS — Z992 Dependence on renal dialysis: Secondary | ICD-10-CM | POA: Diagnosis not present

## 2018-09-09 DIAGNOSIS — D631 Anemia in chronic kidney disease: Secondary | ICD-10-CM | POA: Diagnosis not present

## 2018-09-09 DIAGNOSIS — N2581 Secondary hyperparathyroidism of renal origin: Secondary | ICD-10-CM | POA: Diagnosis not present

## 2018-09-10 DIAGNOSIS — M25569 Pain in unspecified knee: Secondary | ICD-10-CM | POA: Diagnosis not present

## 2018-09-10 DIAGNOSIS — Z79891 Long term (current) use of opiate analgesic: Secondary | ICD-10-CM | POA: Diagnosis not present

## 2018-09-10 DIAGNOSIS — M545 Low back pain: Secondary | ICD-10-CM | POA: Diagnosis not present

## 2018-09-10 DIAGNOSIS — M130 Polyarthritis, unspecified: Secondary | ICD-10-CM | POA: Diagnosis not present

## 2018-09-11 DIAGNOSIS — D631 Anemia in chronic kidney disease: Secondary | ICD-10-CM | POA: Diagnosis not present

## 2018-09-11 DIAGNOSIS — N2581 Secondary hyperparathyroidism of renal origin: Secondary | ICD-10-CM | POA: Diagnosis not present

## 2018-09-11 DIAGNOSIS — Z992 Dependence on renal dialysis: Secondary | ICD-10-CM | POA: Diagnosis not present

## 2018-09-11 DIAGNOSIS — D509 Iron deficiency anemia, unspecified: Secondary | ICD-10-CM | POA: Diagnosis not present

## 2018-09-11 DIAGNOSIS — N186 End stage renal disease: Secondary | ICD-10-CM | POA: Diagnosis not present

## 2018-09-13 DIAGNOSIS — N2581 Secondary hyperparathyroidism of renal origin: Secondary | ICD-10-CM | POA: Diagnosis not present

## 2018-09-13 DIAGNOSIS — D631 Anemia in chronic kidney disease: Secondary | ICD-10-CM | POA: Diagnosis not present

## 2018-09-13 DIAGNOSIS — D509 Iron deficiency anemia, unspecified: Secondary | ICD-10-CM | POA: Diagnosis not present

## 2018-09-13 DIAGNOSIS — Z992 Dependence on renal dialysis: Secondary | ICD-10-CM | POA: Diagnosis not present

## 2018-09-13 DIAGNOSIS — N186 End stage renal disease: Secondary | ICD-10-CM | POA: Diagnosis not present

## 2018-09-13 NOTE — Progress Notes (Signed)
Subjective: CC: f/u HTN PCP: Janora Norlander, DO [id number] Bertholf is a 58 y.o. female presenting to clinic today for:  1.  Hypertension Patient was seen 1 month ago for hypertension.  She had persistently elevated readings over the last several visits and she was instructed to monitor blood pressures daily at home and bring a log in for review.  She presents a log which shows typical blood pressures running 170-180 over 70s.  The lowest blood pressure she shows me is 147/99.  She reports compliance with Norvasc 10 mg daily.  She continues to go to hemodialysis 3 days/week, Tuesdays Thursdays and Saturdays.  No chest pain, shortness of breath.  She has had pretty absent lower extremity edema lately.  She is trying to ambulate more and strengthen her legs.  ROS: Per HPI  Allergies  Allergen Reactions  . Augmentin [Amoxicillin-Pot Clavulanate] Nausea And Vomiting    Vomiting   Past Medical History:  Diagnosis Date  . Ankle injury   . Anxiety   . Anxiety disorder   . CAD (coronary artery disease)    Total RCA, stress echo [facility] 11/2008-no ischemia, EF 55%; h/o CABG  . Carotid artery stenosis    12/10/2008 doppler 22-63% RICA, 3-35% LICA/see evaluation by Dr. Oneida Alar 2011  . Ejection fraction    EF 60-65%, echo, June, 2009  . ESRD on dialysis [facility])    Transplant consideration  . Hemochromatosis   . Hx of CABG    Off pump LIMA to LAD  05/2006  . Hx of tobacco use, presenting hazards to health 04/26/2012  . Hyperlipidemia   . Irregular heart beat   . Murmur    October, 2012  . Neuropathy   . Overweight(278.02)   . Tobacco abuse     Current Outpatient Medications:  .  amLODipine (NORVASC) 10 MG tablet, TAKE ONE TABLET BY MOUTH DAILY., Disp: 90 tablet, Rfl: 3 .  aspirin EC 81 MG tablet, Take 1 tablet (81 mg total) by mouth daily., Disp: , Rfl:  .  atorvastatin (LIPITOR) 80 MG tablet, TAKE ONE TABLET BY MOUTH DAILY., Disp: 90 tablet, Rfl: 1 .  azelastine (ASTELIN) 0.1 %  nasal spray, Place 2 sprays into both nostrils 2 (two) times daily. Use in each nostril as directed, Disp: 30 mL, Rfl: 12 .  carvedilol (COREG) 12.5 MG tablet, Take 1 tablet (12.5 mg total) by mouth 2 (two) times daily., Disp: 60 tablet, Rfl: 6 .  clonazePAM (KLONOPIN) 0.5 MG tablet, Take 1 tablet (0.5 mg total) by mouth 4 (four) times daily., Disp: 120 tablet, Rfl: 2 .  DULoxetine (CYMBALTA) 60 MG capsule, Take 1 capsule (60 mg total) by mouth daily., Disp: 30 capsule, Rfl: 2 .  folic acid (FOLVITE) 1 MG tablet, TAKE ONE TABLET BY MOUTH DAILY., Disp: 90 tablet, Rfl: 1 .  folic acid-vitamin b complex-vitamin c-selenium-zinc (DIALYVITE) 3 MG TABS, Take 1 tablet by mouth daily.  , Disp: , Rfl:  .  HYDROcodone-acetaminophen (NORCO) 5-325 MG tablet, Take 1 tablet by mouth 2 (two) times daily as needed for moderate pain., Disp: 90 tablet, Rfl: 0 .  ibuprofen (ADVIL,MOTRIN) 200 MG tablet, Take 400 mg by mouth every 6 (six) hours as needed for mild pain., Disp: , Rfl:  .  levothyroxine (SYNTHROID, LEVOTHROID) 150 MCG tablet, TAKE ONE TABLET BY MOUTH DAILY., Disp: 30 tablet, Rfl: 10 .  meloxicam (MOBIC) 7.5 MG tablet, Take 7.5 mg by mouth daily., Disp: , Rfl: 1 .  NARCAN 4 MG/0.1ML LIQD  nasal spray kit, Place 1 spray into the nose once as needed. overdose, Disp: , Rfl: 0 .  nitroGLYCERIN (NITROSTAT) 0.4 MG SL tablet, DISSOLVE ONE TABLET UNDER TONGUE EVERY 5 MINUTES UP TO 3 DOSES AS NEEDED FOR CHEST PAIN, Disp: 25 tablet, Rfl: 3 .  olopatadine (PATANOL) 0.1 % ophthalmic solution, Place 1 drop into both eyes 2 (two) times daily., Disp: 5 mL, Rfl: 12 .  OXYGEN, Inhale into the lungs. On 2, Disp: , Rfl:  .  sertraline (ZOLOFT) 100 MG tablet, Take 1 tablet (100 mg total) by mouth daily., Disp: 90 tablet, Rfl: 3 .  sevelamer (RENVELA) 800 MG tablet, Take 1,600-4,000 mg by mouth as directed. Take 5 tablets (4065m) by mouth with meals and 1600 tablets by mouth with snacks., Disp: , Rfl:  .  sodium bicarbonate 650  MG tablet, Take 650 mg by mouth 2 (two) times daily. , Disp: , Rfl:  .  sodium chloride (OCEAN) 0.65 % SOLN nasal spray, Place 1 spray into both nostrils as needed for congestion., Disp: , Rfl:  .  SYMBICORT 160-4.5 MCG/ACT inhaler, INHALE TWO PUFFS INTO THE LUNGS TWICE DAILY., Disp: 10.2 g, Rfl: 0 .  traMADol (ULTRAM) 50 MG tablet, take one at bedtime, Disp: , Rfl: 0 .  VENTOLIN HFA 108 (90 Base) MCG/ACT inhaler, INHALE TWO PUFFS INTO THE LUNGS EVERY 6 HOURS AS NEEDED FOR WHEEZING OR SHORTNESS OF BREATH., Disp: 18 g, Rfl: 4 Social History   Socioeconomic History  . Marital status: Married    Spouse name: Not on file  . Number of children: Not on file  . Years of education: Not on file  . Highest education level: Not on file  Occupational History  . Occupation: DISABLED  Social Needs  . Financial resource strain: Not on file  . Food insecurity:    Worry: Not on file    Inability: Not on file  . Transportation needs:    Medical: Not on file    Non-medical: Not on file  Tobacco Use  . Smoking status: Current Some Day Smoker    Packs/day: 0.10    Years: 30.00    Pack years: 3.00    Types: Cigarettes    Start date: 02/02/1972  . Smokeless tobacco: Never Used  Substance and Sexual Activity  . Alcohol use: No    Alcohol/week: 0.0 standard drinks  . Drug use: No  . Sexual activity: Yes  Lifestyle  . Physical activity:    Days per week: Not on file    Minutes per session: Not on file  . Stress: Not on file  Relationships  . Social connections:    Talks on phone: Not on file    Gets together: Not on file    Attends religious service: Not on file    Active member of club or organization: Not on file    Attends meetings of clubs or organizations: Not on file    Relationship status: Not on file  . Intimate partner violence:    Fear of current or ex partner: Not on file    Emotionally abused: Not on file    Physically abused: Not on file    Forced sexual activity: Not on file    Other Topics Concern  . Not on file  Social History Narrative  . Not on file   Family History  Problem Relation Age of Onset  . Depression Sister   . Dementia Sister   . OCD Sister   . Diabetes  Sister   . Hypertension Sister   . Heart attack Sister   . Bipolar disorder Brother   . Drug abuse Brother   . Diabetes Brother   . Hypertension Brother   . Heart disease Brother   . Hemochromatosis Brother   . Bipolar disorder Other   . ADD / ADHD Other   . Seizures Other   . Bipolar disorder Other   . Alcohol abuse Other   . Dementia Mother   . Heart disease Mother   . Hypertension Mother   . Heart attack Mother   . Alcohol abuse Father   . Heart disease Father        Heart Disease before age 36  . Hypertension Father   . Heart attack Father   . Dementia Maternal Aunt   . OCD Sister   . Heart disease Sister        Heart Disease before age 74  . Diabetes Sister   . Hemochromatosis Sister   . Hypertension Sister   . Rheum arthritis Sister   . Heart attack Brother   . COPD Brother   . Heart attack Brother   . Paranoid behavior Neg Hx   . Schizophrenia Neg Hx   . Sexual abuse Neg Hx   . Physical abuse Neg Hx     Objective: Office vital signs reviewed. BP (!) 174/78   Pulse 74   Temp 98.7 F (37.1 C) (Oral)   Ht '4\' 10"'  (1.473 m)   Wt 167 lb (75.8 kg)   BMI 34.90 kg/m   Physical Examination:  General: Awake, alert, No acute distress HEENT: Normal, MMM, sclera white Cardio: regular rate and rhythm, S1S2 heard, soft systolic murmur at bilateral sternal borders; AVF present in LUE. Pulm: clear to auscultation bilaterally, no wheezes, rhonchi or rales; normal work of breathing on room air Extremities: trace pedal edema noted MSK: uses walker for ambulation  Assessment/ Plan: 58 y.o. female   1. Benign hypertension with ESRD (end-stage renal disease) ([place]) Continues to be uncontrolled both at home and here in office.  I did attempt to reach out to her  nephrologist previously but unfortunately was unable to reach him with regards to antihypertensive regimen.  I had initially planned on starting Atenolol 20m 3 times weekly for blood pressure but it appears that Dr BHarl Bowiehas just switched her to Coreg 12.583mBID.  May need to consider increasing to 2545mID.  She is set up for echo and carotid dopplers soon, I will defer any change in dose to her cardiologist for now.  We may need to consider adding ARB (if potassium has been stable) if she has persistently elevated blood pressures.  No orders of the defined types were placed in this encounter.  AshJanora NorlanderO [id number] [phone]

## 2018-09-15 ENCOUNTER — Ambulatory Visit (INDEPENDENT_AMBULATORY_CARE_PROVIDER_SITE_OTHER): Payer: Medicare Other | Admitting: Family Medicine

## 2018-09-15 ENCOUNTER — Telehealth: Payer: Self-pay | Admitting: Family Medicine

## 2018-09-15 VITALS — BP 174/78 | HR 74 | Temp 98.7°F | Ht <= 58 in | Wt 167.0 lb

## 2018-09-15 DIAGNOSIS — N186 End stage renal disease: Secondary | ICD-10-CM

## 2018-09-15 DIAGNOSIS — I120 Hypertensive chronic kidney disease with stage 5 chronic kidney disease or end stage renal disease: Secondary | ICD-10-CM | POA: Diagnosis not present

## 2018-09-15 MED ORDER — ATENOLOL 25 MG PO TABS: ORAL_TABLET | ORAL | 3 refills | Status: DC

## 2018-09-15 NOTE — Patient Instructions (Signed)
We are starting Atenolol.  Please inform your cardiologist and kidney specialist of this addition.  I will also send them notes today.  Keep an eye on blood pressures and bring these readings to our next visit in 1 month.

## 2018-09-15 NOTE — Telephone Encounter (Signed)
Aware.  Start atenolol and let your specialist know of new medication.

## 2018-09-16 DIAGNOSIS — Z992 Dependence on renal dialysis: Secondary | ICD-10-CM | POA: Diagnosis not present

## 2018-09-16 DIAGNOSIS — N186 End stage renal disease: Secondary | ICD-10-CM | POA: Diagnosis not present

## 2018-09-16 DIAGNOSIS — N2581 Secondary hyperparathyroidism of renal origin: Secondary | ICD-10-CM | POA: Diagnosis not present

## 2018-09-16 DIAGNOSIS — D509 Iron deficiency anemia, unspecified: Secondary | ICD-10-CM | POA: Diagnosis not present

## 2018-09-16 DIAGNOSIS — D631 Anemia in chronic kidney disease: Secondary | ICD-10-CM | POA: Diagnosis not present

## 2018-09-18 DIAGNOSIS — N186 End stage renal disease: Secondary | ICD-10-CM | POA: Diagnosis not present

## 2018-09-18 DIAGNOSIS — Z992 Dependence on renal dialysis: Secondary | ICD-10-CM | POA: Diagnosis not present

## 2018-09-18 DIAGNOSIS — D631 Anemia in chronic kidney disease: Secondary | ICD-10-CM | POA: Diagnosis not present

## 2018-09-18 DIAGNOSIS — D509 Iron deficiency anemia, unspecified: Secondary | ICD-10-CM | POA: Diagnosis not present

## 2018-09-18 DIAGNOSIS — N2581 Secondary hyperparathyroidism of renal origin: Secondary | ICD-10-CM | POA: Diagnosis not present

## 2018-09-19 ENCOUNTER — Other Ambulatory Visit: Payer: Self-pay

## 2018-09-19 ENCOUNTER — Ambulatory Visit (INDEPENDENT_AMBULATORY_CARE_PROVIDER_SITE_OTHER): Payer: Medicare Other | Admitting: Psychiatry

## 2018-09-19 DIAGNOSIS — F331 Major depressive disorder, recurrent, moderate: Secondary | ICD-10-CM

## 2018-09-19 NOTE — Progress Notes (Signed)
   THERAPIST PROGRESS NOTE  Session Time: Friday 09/19/2018 10:06 AM - 10:53 AM    Participation Level: Active  Behavioral Response: Casual/alert, talkative, less euthymic  Type of Therapy: Individual Therapy  Treatment Goals addressed: Begin healthy grieving process  Interventions: Supportive/grief therapy  Summary: Sherry Cox is a 58 y.o. female who is referred for services by [facility] due to experiencing symptoms related to grief and loss issues. Patient denies any psychiatric hospitalizations. She is a returning patient to this clinician as she has been treated in the the past for depression and anxiety. She continues to see psychiatrist Sherry Cox for medication management. Patient reports her oldest sister died in May 27, 2018. She has only one remaining sibling left. She states she is not dealing with sister's death. She says she has just pushed it away and acted like sister has gone somewhere. She says she hasn't really cried. She reports being very emotional and angry. She says she has staring spells. She doesn't talk much to anybody anymore and not being as interested in doing things like cooking. She states feeling as though she doesn't have a life anymore.   Patient last was seen two weeks ago. She reports doing well emotionally since last session. She says she talks to younger sister frequently and being able to laugh when sharing humorous memories of their older sister. She also shares humorous memories of sister in session. She reports now focusing more on the positive memories of sister and focusing less on the pain. She expresses increased acceptance of sister's death. She reports continued interest but decreased involvement in activities due to back pain. However, she is hopeful about addressing back pain and is looking forward to resuming increased involvement in activities. She is planning a trip to beach in June 2020. Patient reports being pleased with her progress in treatment.     Suicidal/Homicidal: Nowithout intent/plan  Therapist Response: Reviewed symptoms, reviewed stages of grief, assisted  patient identify current stage she is experiencing, discussed stages as not being linear process but going back and forth, reviewed concept of integrated grief, assigned patient to bring 2 items that remind her of her sister to next session, discussed patient's progress and developed step down plan for termination to include 2-3 more sessions, discussed patient's feelings about termination  Plan: Return again in 2 weeks.  Diagnosis: Axis I: MDD, Recurrent, Moderate    Axis II: No diagnosis    Alonza Smoker, LCSW 09/19/2018

## 2018-09-20 DIAGNOSIS — D631 Anemia in chronic kidney disease: Secondary | ICD-10-CM | POA: Diagnosis not present

## 2018-09-20 DIAGNOSIS — N186 End stage renal disease: Secondary | ICD-10-CM | POA: Diagnosis not present

## 2018-09-20 DIAGNOSIS — Z992 Dependence on renal dialysis: Secondary | ICD-10-CM | POA: Diagnosis not present

## 2018-09-20 DIAGNOSIS — N2581 Secondary hyperparathyroidism of renal origin: Secondary | ICD-10-CM | POA: Diagnosis not present

## 2018-09-20 DIAGNOSIS — D509 Iron deficiency anemia, unspecified: Secondary | ICD-10-CM | POA: Diagnosis not present

## 2018-09-23 DIAGNOSIS — N2581 Secondary hyperparathyroidism of renal origin: Secondary | ICD-10-CM | POA: Diagnosis not present

## 2018-09-23 DIAGNOSIS — Z992 Dependence on renal dialysis: Secondary | ICD-10-CM | POA: Diagnosis not present

## 2018-09-23 DIAGNOSIS — D631 Anemia in chronic kidney disease: Secondary | ICD-10-CM | POA: Diagnosis not present

## 2018-09-23 DIAGNOSIS — D509 Iron deficiency anemia, unspecified: Secondary | ICD-10-CM | POA: Diagnosis not present

## 2018-09-23 DIAGNOSIS — N186 End stage renal disease: Secondary | ICD-10-CM | POA: Diagnosis not present

## 2018-09-24 ENCOUNTER — Other Ambulatory Visit: Payer: Self-pay

## 2018-09-24 ENCOUNTER — Ambulatory Visit (INDEPENDENT_AMBULATORY_CARE_PROVIDER_SITE_OTHER): Payer: Medicare Other

## 2018-09-24 DIAGNOSIS — I6529 Occlusion and stenosis of unspecified carotid artery: Secondary | ICD-10-CM | POA: Diagnosis not present

## 2018-09-24 DIAGNOSIS — I340 Nonrheumatic mitral (valve) insufficiency: Secondary | ICD-10-CM

## 2018-09-25 DIAGNOSIS — D509 Iron deficiency anemia, unspecified: Secondary | ICD-10-CM | POA: Diagnosis not present

## 2018-09-25 DIAGNOSIS — N2581 Secondary hyperparathyroidism of renal origin: Secondary | ICD-10-CM | POA: Diagnosis not present

## 2018-09-25 DIAGNOSIS — Z992 Dependence on renal dialysis: Secondary | ICD-10-CM | POA: Diagnosis not present

## 2018-09-25 DIAGNOSIS — N186 End stage renal disease: Secondary | ICD-10-CM | POA: Diagnosis not present

## 2018-09-25 DIAGNOSIS — D631 Anemia in chronic kidney disease: Secondary | ICD-10-CM | POA: Diagnosis not present

## 2018-09-27 DIAGNOSIS — Z992 Dependence on renal dialysis: Secondary | ICD-10-CM | POA: Diagnosis not present

## 2018-09-27 DIAGNOSIS — D509 Iron deficiency anemia, unspecified: Secondary | ICD-10-CM | POA: Diagnosis not present

## 2018-09-27 DIAGNOSIS — D631 Anemia in chronic kidney disease: Secondary | ICD-10-CM | POA: Diagnosis not present

## 2018-09-27 DIAGNOSIS — N186 End stage renal disease: Secondary | ICD-10-CM | POA: Diagnosis not present

## 2018-09-27 DIAGNOSIS — N2581 Secondary hyperparathyroidism of renal origin: Secondary | ICD-10-CM | POA: Diagnosis not present

## 2018-09-30 DIAGNOSIS — E785 Hyperlipidemia, unspecified: Secondary | ICD-10-CM | POA: Diagnosis not present

## 2018-09-30 DIAGNOSIS — D509 Iron deficiency anemia, unspecified: Secondary | ICD-10-CM | POA: Diagnosis not present

## 2018-09-30 DIAGNOSIS — D631 Anemia in chronic kidney disease: Secondary | ICD-10-CM | POA: Diagnosis not present

## 2018-09-30 DIAGNOSIS — N186 End stage renal disease: Secondary | ICD-10-CM | POA: Diagnosis not present

## 2018-09-30 DIAGNOSIS — Z992 Dependence on renal dialysis: Secondary | ICD-10-CM | POA: Diagnosis not present

## 2018-09-30 DIAGNOSIS — N2581 Secondary hyperparathyroidism of renal origin: Secondary | ICD-10-CM | POA: Diagnosis not present

## 2018-10-02 ENCOUNTER — Other Ambulatory Visit: Payer: Self-pay | Admitting: Cardiology

## 2018-10-02 ENCOUNTER — Telehealth: Payer: Self-pay | Admitting: *Deleted

## 2018-10-02 DIAGNOSIS — D631 Anemia in chronic kidney disease: Secondary | ICD-10-CM | POA: Diagnosis not present

## 2018-10-02 DIAGNOSIS — Z992 Dependence on renal dialysis: Secondary | ICD-10-CM | POA: Diagnosis not present

## 2018-10-02 DIAGNOSIS — N186 End stage renal disease: Secondary | ICD-10-CM | POA: Diagnosis not present

## 2018-10-02 DIAGNOSIS — N2581 Secondary hyperparathyroidism of renal origin: Secondary | ICD-10-CM | POA: Diagnosis not present

## 2018-10-02 DIAGNOSIS — D509 Iron deficiency anemia, unspecified: Secondary | ICD-10-CM | POA: Diagnosis not present

## 2018-10-02 NOTE — Telephone Encounter (Signed)
LM to return call.

## 2018-10-02 NOTE — Telephone Encounter (Signed)
LM to return call    Notes recorded by Arnoldo Lenis, MD on 10/01/2018 at 1:37 PM EDT Carotid US shows mild to moderate blockages on both sides, we will continue to monitor

## 2018-10-02 NOTE — Telephone Encounter (Signed)
-----   Message from Arnoldo Lenis, MD sent at 10/01/2018  1:39 PM EDT ----- Echo shows 2 valves that are leaky in the range, continue to monitor. We will continue to monitor.   Zandra Abts MD

## 2018-10-03 ENCOUNTER — Ambulatory Visit (HOSPITAL_COMMUNITY): Payer: Medicare Other | Admitting: Psychiatry

## 2018-10-04 DIAGNOSIS — D509 Iron deficiency anemia, unspecified: Secondary | ICD-10-CM | POA: Diagnosis not present

## 2018-10-04 DIAGNOSIS — D631 Anemia in chronic kidney disease: Secondary | ICD-10-CM | POA: Diagnosis not present

## 2018-10-04 DIAGNOSIS — Z992 Dependence on renal dialysis: Secondary | ICD-10-CM | POA: Diagnosis not present

## 2018-10-04 DIAGNOSIS — N2581 Secondary hyperparathyroidism of renal origin: Secondary | ICD-10-CM | POA: Diagnosis not present

## 2018-10-04 DIAGNOSIS — N186 End stage renal disease: Secondary | ICD-10-CM | POA: Diagnosis not present

## 2018-10-07 DIAGNOSIS — Z992 Dependence on renal dialysis: Secondary | ICD-10-CM | POA: Diagnosis not present

## 2018-10-07 DIAGNOSIS — D631 Anemia in chronic kidney disease: Secondary | ICD-10-CM | POA: Diagnosis not present

## 2018-10-07 DIAGNOSIS — N2581 Secondary hyperparathyroidism of renal origin: Secondary | ICD-10-CM | POA: Diagnosis not present

## 2018-10-07 DIAGNOSIS — D509 Iron deficiency anemia, unspecified: Secondary | ICD-10-CM | POA: Diagnosis not present

## 2018-10-07 DIAGNOSIS — N186 End stage renal disease: Secondary | ICD-10-CM | POA: Diagnosis not present

## 2018-10-09 DIAGNOSIS — D509 Iron deficiency anemia, unspecified: Secondary | ICD-10-CM | POA: Diagnosis not present

## 2018-10-09 DIAGNOSIS — N186 End stage renal disease: Secondary | ICD-10-CM | POA: Diagnosis not present

## 2018-10-09 DIAGNOSIS — N2581 Secondary hyperparathyroidism of renal origin: Secondary | ICD-10-CM | POA: Diagnosis not present

## 2018-10-09 DIAGNOSIS — D631 Anemia in chronic kidney disease: Secondary | ICD-10-CM | POA: Diagnosis not present

## 2018-10-09 DIAGNOSIS — Z992 Dependence on renal dialysis: Secondary | ICD-10-CM | POA: Diagnosis not present

## 2018-10-09 NOTE — Telephone Encounter (Signed)
Follow up    Patient is returning your call for test results.

## 2018-10-09 NOTE — Telephone Encounter (Signed)
Pt voiced understanding of carotid and echo results. Routed to pcp

## 2018-10-10 ENCOUNTER — Other Ambulatory Visit (HOSPITAL_COMMUNITY): Payer: Self-pay | Admitting: Psychiatry

## 2018-10-10 ENCOUNTER — Other Ambulatory Visit: Payer: Self-pay | Admitting: Family Medicine

## 2018-10-11 DIAGNOSIS — N186 End stage renal disease: Secondary | ICD-10-CM | POA: Diagnosis not present

## 2018-10-11 DIAGNOSIS — N2581 Secondary hyperparathyroidism of renal origin: Secondary | ICD-10-CM | POA: Diagnosis not present

## 2018-10-11 DIAGNOSIS — D509 Iron deficiency anemia, unspecified: Secondary | ICD-10-CM | POA: Diagnosis not present

## 2018-10-11 DIAGNOSIS — Z992 Dependence on renal dialysis: Secondary | ICD-10-CM | POA: Diagnosis not present

## 2018-10-11 DIAGNOSIS — D631 Anemia in chronic kidney disease: Secondary | ICD-10-CM | POA: Diagnosis not present

## 2018-10-14 DIAGNOSIS — D509 Iron deficiency anemia, unspecified: Secondary | ICD-10-CM | POA: Diagnosis not present

## 2018-10-14 DIAGNOSIS — N186 End stage renal disease: Secondary | ICD-10-CM | POA: Diagnosis not present

## 2018-10-14 DIAGNOSIS — N2581 Secondary hyperparathyroidism of renal origin: Secondary | ICD-10-CM | POA: Diagnosis not present

## 2018-10-14 DIAGNOSIS — D631 Anemia in chronic kidney disease: Secondary | ICD-10-CM | POA: Diagnosis not present

## 2018-10-14 DIAGNOSIS — Z992 Dependence on renal dialysis: Secondary | ICD-10-CM | POA: Diagnosis not present

## 2018-10-16 DIAGNOSIS — Z992 Dependence on renal dialysis: Secondary | ICD-10-CM | POA: Diagnosis not present

## 2018-10-16 DIAGNOSIS — N2581 Secondary hyperparathyroidism of renal origin: Secondary | ICD-10-CM | POA: Diagnosis not present

## 2018-10-16 DIAGNOSIS — D631 Anemia in chronic kidney disease: Secondary | ICD-10-CM | POA: Diagnosis not present

## 2018-10-16 DIAGNOSIS — D509 Iron deficiency anemia, unspecified: Secondary | ICD-10-CM | POA: Diagnosis not present

## 2018-10-16 DIAGNOSIS — N186 End stage renal disease: Secondary | ICD-10-CM | POA: Diagnosis not present

## 2018-10-17 ENCOUNTER — Ambulatory Visit (HOSPITAL_COMMUNITY): Payer: Medicare Other | Admitting: Psychiatry

## 2018-10-18 DIAGNOSIS — D631 Anemia in chronic kidney disease: Secondary | ICD-10-CM | POA: Diagnosis not present

## 2018-10-18 DIAGNOSIS — D509 Iron deficiency anemia, unspecified: Secondary | ICD-10-CM | POA: Diagnosis not present

## 2018-10-18 DIAGNOSIS — N2581 Secondary hyperparathyroidism of renal origin: Secondary | ICD-10-CM | POA: Diagnosis not present

## 2018-10-18 DIAGNOSIS — N186 End stage renal disease: Secondary | ICD-10-CM | POA: Diagnosis not present

## 2018-10-18 DIAGNOSIS — Z992 Dependence on renal dialysis: Secondary | ICD-10-CM | POA: Diagnosis not present

## 2018-10-21 DIAGNOSIS — N2581 Secondary hyperparathyroidism of renal origin: Secondary | ICD-10-CM | POA: Diagnosis not present

## 2018-10-21 DIAGNOSIS — D509 Iron deficiency anemia, unspecified: Secondary | ICD-10-CM | POA: Diagnosis not present

## 2018-10-21 DIAGNOSIS — N186 End stage renal disease: Secondary | ICD-10-CM | POA: Diagnosis not present

## 2018-10-21 DIAGNOSIS — Z992 Dependence on renal dialysis: Secondary | ICD-10-CM | POA: Diagnosis not present

## 2018-10-21 DIAGNOSIS — D631 Anemia in chronic kidney disease: Secondary | ICD-10-CM | POA: Diagnosis not present

## 2018-10-22 ENCOUNTER — Other Ambulatory Visit: Payer: Self-pay

## 2018-10-22 ENCOUNTER — Ambulatory Visit (INDEPENDENT_AMBULATORY_CARE_PROVIDER_SITE_OTHER): Payer: Medicare Other | Admitting: Family Medicine

## 2018-10-22 ENCOUNTER — Encounter: Payer: Self-pay | Admitting: Family Medicine

## 2018-10-22 DIAGNOSIS — I120 Hypertensive chronic kidney disease with stage 5 chronic kidney disease or end stage renal disease: Secondary | ICD-10-CM

## 2018-10-22 DIAGNOSIS — Z992 Dependence on renal dialysis: Secondary | ICD-10-CM

## 2018-10-22 DIAGNOSIS — N186 End stage renal disease: Secondary | ICD-10-CM

## 2018-10-22 DIAGNOSIS — J449 Chronic obstructive pulmonary disease, unspecified: Secondary | ICD-10-CM

## 2018-10-22 MED ORDER — BUDESONIDE-FORMOTEROL FUMARATE 160-4.5 MCG/ACT IN AERO: INHALATION_SPRAY | RESPIRATORY_TRACT | 3 refills | Status: AC

## 2018-10-22 NOTE — Progress Notes (Signed)
Telephone visit  Subjective: CC:f/u HTN PCP: Janora Norlander, DO NWG:Sherry Cox is a 58 y.o. female calls for telephone consult today. Patient provides verbal consent for consult held via phone.  Location of patient: home Location of provider: [facility] Others present for call: none  1. HTN, ESRD on HD Patient reports that blood pressures tend to be elevated before hemodialysis but after she undergoes hemodialysis blood pressures are running 130-140/80.  She reports compliance with Norvasc 10 mg and Coreg 12.5 mg twice daily.  No chest pain, shortness of breath, lower extremity edema or change in exercise tolerance.  She is trying to become more active though notes that she continues to have intermittent swelling in the knee, which she had surgery on in the last year.  2.  COPD Patient reports stability of breathing.  She reports compliance with Symbicort and as needed use of albuterol.  No shortness of breath or wheezing.   ROS: Per HPI  Allergies  Allergen Reactions  . Augmentin [Amoxicillin-Pot Clavulanate] Nausea And Vomiting    Vomiting   Past Medical History:  Diagnosis Date  . Ankle injury   . Anxiety   . Anxiety disorder   . CAD (coronary artery disease)    Total RCA, stress echo [facility] 11/2008-no ischemia, EF 55%; h/o CABG  . Carotid artery stenosis    12/10/2008 doppler 86-57% RICA, 8-46% LICA/see evaluation by Dr. Oneida Alar 2011  . Ejection fraction    EF 60-65%, echo, June, 2009  . ESRD on dialysis [facility])    Transplant consideration  . Hemochromatosis   . Hx of CABG    Off pump LIMA to LAD  05/2006  . Hx of tobacco use, presenting hazards to health 04/26/2012  . Hyperlipidemia   . Irregular heart beat   . Murmur    October, 2012  . Neuropathy   . Overweight(278.02)   . Tobacco abuse     Current Outpatient Medications:  .  amLODipine (NORVASC) 10 MG tablet, TAKE ONE TABLET BY MOUTH DAILY., Disp: 90 tablet, Rfl: 3 .  aspirin EC 81 MG tablet, Take 1 tablet  (81 mg total) by mouth daily., Disp: , Rfl:  .  atorvastatin (LIPITOR) 80 MG tablet, TAKE ONE TABLET BY MOUTH DAILY., Disp: 90 tablet, Rfl: 1 .  azelastine (ASTELIN) 0.1 % nasal spray, Place 2 sprays into both nostrils 2 (two) times daily. Use in each nostril as directed, Disp: 30 mL, Rfl: 12 .  carvedilol (COREG) 12.5 MG tablet, Take 1 tablet (12.5 mg total) by mouth 2 (two) times daily., Disp: 60 tablet, Rfl: 6 .  clonazePAM (KLONOPIN) 0.5 MG tablet, TAKE ONE TABLET BY MOUTH FOUR TIMES DAILY., Disp: 120 tablet, Rfl: 2 .  DULoxetine (CYMBALTA) 60 MG capsule, Take 1 capsule (60 mg total) by mouth daily., Disp: 30 capsule, Rfl: 2 .  folic acid (FOLVITE) 1 MG tablet, TAKE ONE TABLET BY MOUTH DAILY., Disp: 90 tablet, Rfl: 1 .  folic acid-vitamin b complex-vitamin c-selenium-zinc (DIALYVITE) 3 MG TABS, Take 1 tablet by mouth daily.  , Disp: , Rfl:  .  HYDROcodone-acetaminophen (NORCO) 5-325 MG tablet, Take 1 tablet by mouth 2 (two) times daily as needed for moderate pain., Disp: 90 tablet, Rfl: 0 .  ibuprofen (ADVIL,MOTRIN) 200 MG tablet, Take 400 mg by mouth every 6 (six) hours as needed for mild pain., Disp: , Rfl:  .  levothyroxine (SYNTHROID, LEVOTHROID) 150 MCG tablet, TAKE ONE TABLET BY MOUTH DAILY., Disp: 30 tablet, Rfl: 10 .  meloxicam (MOBIC)  7.5 MG tablet, Take 7.5 mg by mouth daily., Disp: , Rfl: 1 .  NARCAN 4 MG/0.1ML LIQD nasal spray kit, Place 1 spray into the nose once as needed. overdose, Disp: , Rfl: 0 .  nitroGLYCERIN (NITROSTAT) 0.4 MG SL tablet, DISSOLVE ONE TABLET UNDER TONGUE EVERY 5 MINUTES UP TO 3 DOSES AS NEEDED FOR CHEST PAIN, Disp: 25 tablet, Rfl: 6 .  olopatadine (PATANOL) 0.1 % ophthalmic solution, Place 1 drop into both eyes 2 (two) times daily., Disp: 5 mL, Rfl: 12 .  OXYGEN, Inhale into the lungs. On 2, Disp: , Rfl:  .  sertraline (ZOLOFT) 100 MG tablet, Take 1 tablet (100 mg total) by mouth daily., Disp: 90 tablet, Rfl: 3 .  sevelamer (RENVELA) 800 MG tablet, Take  1,600-4,000 mg by mouth as directed. Take 5 tablets (4027m) by mouth with meals and 1600 tablets by mouth with snacks., Disp: , Rfl:  .  sodium bicarbonate 650 MG tablet, Take 650 mg by mouth 2 (two) times daily. , Disp: , Rfl:  .  sodium chloride (OCEAN) 0.65 % SOLN nasal spray, Place 1 spray into both nostrils as needed for congestion., Disp: , Rfl:  .  SYMBICORT 160-4.5 MCG/ACT inhaler, INHALE TWO PUFFS INTO THE LUNGS TWICE DAILY., Disp: 10.2 g, Rfl: 0 .  traMADol (ULTRAM) 50 MG tablet, take one at bedtime, Disp: , Rfl: 0 .  VENTOLIN HFA 108 (90 Base) MCG/ACT inhaler, INHALE TWO PUFFS INTO THE LUNGS EVERY 6 HOURS AS NEEDED FOR WHEEZING OR SHORTNESS OF BREATH., Disp: 18 g, Rfl: 4  Assessment/ Plan: 58y.o. female   1. Benign [place] her [place] controlled after being dialyzed at her hemodialysis sessions.  Continue current regimen.  No refills needed at this time  2. [place] Continues to go to dialysis 3 times per week.  She reports stability.  3. Chronic obstructive pulmonary disease, unspecified COPD type ([place] Stable.  I have refilled her Symbicort.  She has plenty of albuterol on hand. - budesonide-formoterol (SYMBICORT) 160-4.5 MCG/ACT inhaler; INHALE TWO PUFFS INTO THE LUNGS TWICE DAILY.  Dispense: 10.2 g; Refill: 3   Start time: 9:33am End time: 9:38am  Total time spent on patient care (including telephone call/ virtual visit): 10 minutes  ADeaf Smith DHickman([phone]

## 2018-10-23 DIAGNOSIS — N186 End stage renal disease: Secondary | ICD-10-CM | POA: Diagnosis not present

## 2018-10-23 DIAGNOSIS — N2581 Secondary hyperparathyroidism of renal origin: Secondary | ICD-10-CM | POA: Diagnosis not present

## 2018-10-23 DIAGNOSIS — D631 Anemia in chronic kidney disease: Secondary | ICD-10-CM | POA: Diagnosis not present

## 2018-10-23 DIAGNOSIS — D509 Iron deficiency anemia, unspecified: Secondary | ICD-10-CM | POA: Diagnosis not present

## 2018-10-23 DIAGNOSIS — Z992 Dependence on renal dialysis: Secondary | ICD-10-CM | POA: Diagnosis not present

## 2018-10-25 DIAGNOSIS — N2581 Secondary hyperparathyroidism of renal origin: Secondary | ICD-10-CM | POA: Diagnosis not present

## 2018-10-25 DIAGNOSIS — N186 End stage renal disease: Secondary | ICD-10-CM | POA: Diagnosis not present

## 2018-10-25 DIAGNOSIS — D631 Anemia in chronic kidney disease: Secondary | ICD-10-CM | POA: Diagnosis not present

## 2018-10-25 DIAGNOSIS — Z992 Dependence on renal dialysis: Secondary | ICD-10-CM | POA: Diagnosis not present

## 2018-10-25 DIAGNOSIS — D509 Iron deficiency anemia, unspecified: Secondary | ICD-10-CM | POA: Diagnosis not present

## 2018-10-28 DIAGNOSIS — D631 Anemia in chronic kidney disease: Secondary | ICD-10-CM | POA: Diagnosis not present

## 2018-10-28 DIAGNOSIS — N186 End stage renal disease: Secondary | ICD-10-CM | POA: Diagnosis not present

## 2018-10-28 DIAGNOSIS — N2581 Secondary hyperparathyroidism of renal origin: Secondary | ICD-10-CM | POA: Diagnosis not present

## 2018-10-28 DIAGNOSIS — D509 Iron deficiency anemia, unspecified: Secondary | ICD-10-CM | POA: Diagnosis not present

## 2018-10-28 DIAGNOSIS — Z992 Dependence on renal dialysis: Secondary | ICD-10-CM | POA: Diagnosis not present

## 2018-10-30 DIAGNOSIS — Z992 Dependence on renal dialysis: Secondary | ICD-10-CM | POA: Diagnosis not present

## 2018-10-30 DIAGNOSIS — D509 Iron deficiency anemia, unspecified: Secondary | ICD-10-CM | POA: Diagnosis not present

## 2018-10-30 DIAGNOSIS — N2581 Secondary hyperparathyroidism of renal origin: Secondary | ICD-10-CM | POA: Diagnosis not present

## 2018-10-30 DIAGNOSIS — D631 Anemia in chronic kidney disease: Secondary | ICD-10-CM | POA: Diagnosis not present

## 2018-10-30 DIAGNOSIS — N186 End stage renal disease: Secondary | ICD-10-CM | POA: Diagnosis not present

## 2018-11-01 DIAGNOSIS — D631 Anemia in chronic kidney disease: Secondary | ICD-10-CM | POA: Diagnosis not present

## 2018-11-01 DIAGNOSIS — N186 End stage renal disease: Secondary | ICD-10-CM | POA: Diagnosis not present

## 2018-11-01 DIAGNOSIS — Z992 Dependence on renal dialysis: Secondary | ICD-10-CM | POA: Diagnosis not present

## 2018-11-01 DIAGNOSIS — D509 Iron deficiency anemia, unspecified: Secondary | ICD-10-CM | POA: Diagnosis not present

## 2018-11-01 DIAGNOSIS — N2581 Secondary hyperparathyroidism of renal origin: Secondary | ICD-10-CM | POA: Diagnosis not present

## 2018-11-04 DIAGNOSIS — N186 End stage renal disease: Secondary | ICD-10-CM | POA: Diagnosis not present

## 2018-11-04 DIAGNOSIS — Z992 Dependence on renal dialysis: Secondary | ICD-10-CM | POA: Diagnosis not present

## 2018-11-04 DIAGNOSIS — D631 Anemia in chronic kidney disease: Secondary | ICD-10-CM | POA: Diagnosis not present

## 2018-11-04 DIAGNOSIS — N2581 Secondary hyperparathyroidism of renal origin: Secondary | ICD-10-CM | POA: Diagnosis not present

## 2018-11-04 DIAGNOSIS — D509 Iron deficiency anemia, unspecified: Secondary | ICD-10-CM | POA: Diagnosis not present

## 2018-11-06 DIAGNOSIS — N186 End stage renal disease: Secondary | ICD-10-CM | POA: Diagnosis not present

## 2018-11-06 DIAGNOSIS — D631 Anemia in chronic kidney disease: Secondary | ICD-10-CM | POA: Diagnosis not present

## 2018-11-06 DIAGNOSIS — N2581 Secondary hyperparathyroidism of renal origin: Secondary | ICD-10-CM | POA: Diagnosis not present

## 2018-11-06 DIAGNOSIS — D509 Iron deficiency anemia, unspecified: Secondary | ICD-10-CM | POA: Diagnosis not present

## 2018-11-06 DIAGNOSIS — Z992 Dependence on renal dialysis: Secondary | ICD-10-CM | POA: Diagnosis not present

## 2018-11-08 DIAGNOSIS — N186 End stage renal disease: Secondary | ICD-10-CM | POA: Diagnosis not present

## 2018-11-08 DIAGNOSIS — D509 Iron deficiency anemia, unspecified: Secondary | ICD-10-CM | POA: Diagnosis not present

## 2018-11-08 DIAGNOSIS — Z992 Dependence on renal dialysis: Secondary | ICD-10-CM | POA: Diagnosis not present

## 2018-11-08 DIAGNOSIS — N2581 Secondary hyperparathyroidism of renal origin: Secondary | ICD-10-CM | POA: Diagnosis not present

## 2018-11-08 DIAGNOSIS — D631 Anemia in chronic kidney disease: Secondary | ICD-10-CM | POA: Diagnosis not present

## 2018-11-10 ENCOUNTER — Other Ambulatory Visit: Payer: Self-pay

## 2018-11-10 ENCOUNTER — Ambulatory Visit (HOSPITAL_COMMUNITY): Payer: Medicare Other | Admitting: Psychiatry

## 2018-11-11 DIAGNOSIS — D631 Anemia in chronic kidney disease: Secondary | ICD-10-CM | POA: Diagnosis not present

## 2018-11-11 DIAGNOSIS — N2581 Secondary hyperparathyroidism of renal origin: Secondary | ICD-10-CM | POA: Diagnosis not present

## 2018-11-11 DIAGNOSIS — Z992 Dependence on renal dialysis: Secondary | ICD-10-CM | POA: Diagnosis not present

## 2018-11-11 DIAGNOSIS — D509 Iron deficiency anemia, unspecified: Secondary | ICD-10-CM | POA: Diagnosis not present

## 2018-11-11 DIAGNOSIS — N186 End stage renal disease: Secondary | ICD-10-CM | POA: Diagnosis not present

## 2018-11-13 DIAGNOSIS — N186 End stage renal disease: Secondary | ICD-10-CM | POA: Diagnosis not present

## 2018-11-13 DIAGNOSIS — Z992 Dependence on renal dialysis: Secondary | ICD-10-CM | POA: Diagnosis not present

## 2018-11-13 DIAGNOSIS — D631 Anemia in chronic kidney disease: Secondary | ICD-10-CM | POA: Diagnosis not present

## 2018-11-13 DIAGNOSIS — D509 Iron deficiency anemia, unspecified: Secondary | ICD-10-CM | POA: Diagnosis not present

## 2018-11-13 DIAGNOSIS — N2581 Secondary hyperparathyroidism of renal origin: Secondary | ICD-10-CM | POA: Diagnosis not present

## 2018-11-15 DIAGNOSIS — N186 End stage renal disease: Secondary | ICD-10-CM | POA: Diagnosis not present

## 2018-11-15 DIAGNOSIS — N2581 Secondary hyperparathyroidism of renal origin: Secondary | ICD-10-CM | POA: Diagnosis not present

## 2018-11-15 DIAGNOSIS — D509 Iron deficiency anemia, unspecified: Secondary | ICD-10-CM | POA: Diagnosis not present

## 2018-11-15 DIAGNOSIS — Z992 Dependence on renal dialysis: Secondary | ICD-10-CM | POA: Diagnosis not present

## 2018-11-15 DIAGNOSIS — D631 Anemia in chronic kidney disease: Secondary | ICD-10-CM | POA: Diagnosis not present

## 2018-11-18 DIAGNOSIS — D509 Iron deficiency anemia, unspecified: Secondary | ICD-10-CM | POA: Diagnosis not present

## 2018-11-18 DIAGNOSIS — Z992 Dependence on renal dialysis: Secondary | ICD-10-CM | POA: Diagnosis not present

## 2018-11-18 DIAGNOSIS — N186 End stage renal disease: Secondary | ICD-10-CM | POA: Diagnosis not present

## 2018-11-18 DIAGNOSIS — N2581 Secondary hyperparathyroidism of renal origin: Secondary | ICD-10-CM | POA: Diagnosis not present

## 2018-11-18 DIAGNOSIS — D631 Anemia in chronic kidney disease: Secondary | ICD-10-CM | POA: Diagnosis not present

## 2018-11-20 ENCOUNTER — Telehealth: Payer: Self-pay | Admitting: Family Medicine

## 2018-11-20 DIAGNOSIS — N186 End stage renal disease: Secondary | ICD-10-CM | POA: Diagnosis not present

## 2018-11-20 DIAGNOSIS — D631 Anemia in chronic kidney disease: Secondary | ICD-10-CM | POA: Diagnosis not present

## 2018-11-20 DIAGNOSIS — Z992 Dependence on renal dialysis: Secondary | ICD-10-CM | POA: Diagnosis not present

## 2018-11-20 DIAGNOSIS — D509 Iron deficiency anemia, unspecified: Secondary | ICD-10-CM | POA: Diagnosis not present

## 2018-11-20 DIAGNOSIS — N2581 Secondary hyperparathyroidism of renal origin: Secondary | ICD-10-CM | POA: Diagnosis not present

## 2018-11-22 DIAGNOSIS — Z992 Dependence on renal dialysis: Secondary | ICD-10-CM | POA: Diagnosis not present

## 2018-11-22 DIAGNOSIS — D509 Iron deficiency anemia, unspecified: Secondary | ICD-10-CM | POA: Diagnosis not present

## 2018-11-22 DIAGNOSIS — D631 Anemia in chronic kidney disease: Secondary | ICD-10-CM | POA: Diagnosis not present

## 2018-11-22 DIAGNOSIS — N2581 Secondary hyperparathyroidism of renal origin: Secondary | ICD-10-CM | POA: Diagnosis not present

## 2018-11-22 DIAGNOSIS — N186 End stage renal disease: Secondary | ICD-10-CM | POA: Diagnosis not present

## 2018-11-25 DIAGNOSIS — N186 End stage renal disease: Secondary | ICD-10-CM | POA: Diagnosis not present

## 2018-11-25 DIAGNOSIS — N2581 Secondary hyperparathyroidism of renal origin: Secondary | ICD-10-CM | POA: Diagnosis not present

## 2018-11-25 DIAGNOSIS — Z992 Dependence on renal dialysis: Secondary | ICD-10-CM | POA: Diagnosis not present

## 2018-11-25 DIAGNOSIS — D509 Iron deficiency anemia, unspecified: Secondary | ICD-10-CM | POA: Diagnosis not present

## 2018-11-25 DIAGNOSIS — D631 Anemia in chronic kidney disease: Secondary | ICD-10-CM | POA: Diagnosis not present

## 2018-11-27 DIAGNOSIS — Z992 Dependence on renal dialysis: Secondary | ICD-10-CM | POA: Diagnosis not present

## 2018-11-27 DIAGNOSIS — N2581 Secondary hyperparathyroidism of renal origin: Secondary | ICD-10-CM | POA: Diagnosis not present

## 2018-11-27 DIAGNOSIS — N186 End stage renal disease: Secondary | ICD-10-CM | POA: Diagnosis not present

## 2018-11-27 DIAGNOSIS — D509 Iron deficiency anemia, unspecified: Secondary | ICD-10-CM | POA: Diagnosis not present

## 2018-11-27 DIAGNOSIS — D631 Anemia in chronic kidney disease: Secondary | ICD-10-CM | POA: Diagnosis not present

## 2018-11-29 DIAGNOSIS — D509 Iron deficiency anemia, unspecified: Secondary | ICD-10-CM | POA: Diagnosis not present

## 2018-11-29 DIAGNOSIS — N186 End stage renal disease: Secondary | ICD-10-CM | POA: Diagnosis not present

## 2018-11-29 DIAGNOSIS — N2581 Secondary hyperparathyroidism of renal origin: Secondary | ICD-10-CM | POA: Diagnosis not present

## 2018-11-29 DIAGNOSIS — D631 Anemia in chronic kidney disease: Secondary | ICD-10-CM | POA: Diagnosis not present

## 2018-11-29 DIAGNOSIS — Z992 Dependence on renal dialysis: Secondary | ICD-10-CM | POA: Diagnosis not present

## 2018-12-02 ENCOUNTER — Encounter: Payer: Self-pay | Admitting: Cardiology

## 2018-12-02 DIAGNOSIS — D509 Iron deficiency anemia, unspecified: Secondary | ICD-10-CM | POA: Diagnosis not present

## 2018-12-02 DIAGNOSIS — N2581 Secondary hyperparathyroidism of renal origin: Secondary | ICD-10-CM | POA: Diagnosis not present

## 2018-12-02 DIAGNOSIS — N186 End stage renal disease: Secondary | ICD-10-CM | POA: Diagnosis not present

## 2018-12-02 DIAGNOSIS — D631 Anemia in chronic kidney disease: Secondary | ICD-10-CM | POA: Diagnosis not present

## 2018-12-02 DIAGNOSIS — Z992 Dependence on renal dialysis: Secondary | ICD-10-CM | POA: Diagnosis not present

## 2018-12-03 DIAGNOSIS — Z79891 Long term (current) use of opiate analgesic: Secondary | ICD-10-CM | POA: Diagnosis not present

## 2018-12-03 DIAGNOSIS — M25569 Pain in unspecified knee: Secondary | ICD-10-CM | POA: Diagnosis not present

## 2018-12-03 DIAGNOSIS — M545 Low back pain: Secondary | ICD-10-CM | POA: Diagnosis not present

## 2018-12-03 DIAGNOSIS — M130 Polyarthritis, unspecified: Secondary | ICD-10-CM | POA: Diagnosis not present

## 2018-12-04 DIAGNOSIS — N186 End stage renal disease: Secondary | ICD-10-CM | POA: Diagnosis not present

## 2018-12-04 DIAGNOSIS — D631 Anemia in chronic kidney disease: Secondary | ICD-10-CM | POA: Diagnosis not present

## 2018-12-04 DIAGNOSIS — N2581 Secondary hyperparathyroidism of renal origin: Secondary | ICD-10-CM | POA: Diagnosis not present

## 2018-12-04 DIAGNOSIS — D509 Iron deficiency anemia, unspecified: Secondary | ICD-10-CM | POA: Diagnosis not present

## 2018-12-04 DIAGNOSIS — Z992 Dependence on renal dialysis: Secondary | ICD-10-CM | POA: Diagnosis not present

## 2018-12-05 ENCOUNTER — Other Ambulatory Visit: Payer: Self-pay | Admitting: Cardiology

## 2018-12-06 DIAGNOSIS — D631 Anemia in chronic kidney disease: Secondary | ICD-10-CM | POA: Diagnosis not present

## 2018-12-06 DIAGNOSIS — Z992 Dependence on renal dialysis: Secondary | ICD-10-CM | POA: Diagnosis not present

## 2018-12-06 DIAGNOSIS — N186 End stage renal disease: Secondary | ICD-10-CM | POA: Diagnosis not present

## 2018-12-06 DIAGNOSIS — N2581 Secondary hyperparathyroidism of renal origin: Secondary | ICD-10-CM | POA: Diagnosis not present

## 2018-12-06 DIAGNOSIS — D509 Iron deficiency anemia, unspecified: Secondary | ICD-10-CM | POA: Diagnosis not present

## 2018-12-07 DIAGNOSIS — N186 End stage renal disease: Secondary | ICD-10-CM | POA: Diagnosis not present

## 2018-12-07 DIAGNOSIS — Z992 Dependence on renal dialysis: Secondary | ICD-10-CM | POA: Diagnosis not present

## 2018-12-09 DIAGNOSIS — D631 Anemia in chronic kidney disease: Secondary | ICD-10-CM | POA: Diagnosis not present

## 2018-12-09 DIAGNOSIS — D509 Iron deficiency anemia, unspecified: Secondary | ICD-10-CM | POA: Diagnosis not present

## 2018-12-09 DIAGNOSIS — N2581 Secondary hyperparathyroidism of renal origin: Secondary | ICD-10-CM | POA: Diagnosis not present

## 2018-12-09 DIAGNOSIS — N186 End stage renal disease: Secondary | ICD-10-CM | POA: Diagnosis not present

## 2018-12-09 DIAGNOSIS — Z992 Dependence on renal dialysis: Secondary | ICD-10-CM | POA: Diagnosis not present

## 2018-12-11 DIAGNOSIS — Z992 Dependence on renal dialysis: Secondary | ICD-10-CM | POA: Diagnosis not present

## 2018-12-11 DIAGNOSIS — D631 Anemia in chronic kidney disease: Secondary | ICD-10-CM | POA: Diagnosis not present

## 2018-12-11 DIAGNOSIS — N2581 Secondary hyperparathyroidism of renal origin: Secondary | ICD-10-CM | POA: Diagnosis not present

## 2018-12-11 DIAGNOSIS — N186 End stage renal disease: Secondary | ICD-10-CM | POA: Diagnosis not present

## 2018-12-11 DIAGNOSIS — D509 Iron deficiency anemia, unspecified: Secondary | ICD-10-CM | POA: Diagnosis not present

## 2018-12-13 DIAGNOSIS — D631 Anemia in chronic kidney disease: Secondary | ICD-10-CM | POA: Diagnosis not present

## 2018-12-13 DIAGNOSIS — N186 End stage renal disease: Secondary | ICD-10-CM | POA: Diagnosis not present

## 2018-12-13 DIAGNOSIS — Z992 Dependence on renal dialysis: Secondary | ICD-10-CM | POA: Diagnosis not present

## 2018-12-13 DIAGNOSIS — N2581 Secondary hyperparathyroidism of renal origin: Secondary | ICD-10-CM | POA: Diagnosis not present

## 2018-12-13 DIAGNOSIS — D509 Iron deficiency anemia, unspecified: Secondary | ICD-10-CM | POA: Diagnosis not present

## 2018-12-15 ENCOUNTER — Ambulatory Visit (INDEPENDENT_AMBULATORY_CARE_PROVIDER_SITE_OTHER): Payer: Medicare Other | Admitting: Psychiatry

## 2018-12-15 ENCOUNTER — Encounter (HOSPITAL_COMMUNITY): Payer: Self-pay | Admitting: Psychiatry

## 2018-12-15 ENCOUNTER — Other Ambulatory Visit: Payer: Self-pay

## 2018-12-15 DIAGNOSIS — F331 Major depressive disorder, recurrent, moderate: Secondary | ICD-10-CM

## 2018-12-15 DIAGNOSIS — I6529 Occlusion and stenosis of unspecified carotid artery: Secondary | ICD-10-CM

## 2018-12-15 MED ORDER — DULOXETINE HCL 60 MG PO CPEP: 60.0000 mg | ORAL_CAPSULE | Freq: Every day | ORAL | 2 refills | Status: AC

## 2018-12-15 MED ORDER — SERTRALINE HCL 100 MG PO TABS: 100.0000 mg | ORAL_TABLET | Freq: Every day | ORAL | 3 refills | Status: AC

## 2018-12-15 MED ORDER — CLONAZEPAM 0.5 MG PO TABS: 0.5000 mg | ORAL_TABLET | Freq: Four times a day (QID) | ORAL | 2 refills | Status: AC
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
## 2018-12-16 ENCOUNTER — Encounter: Payer: Self-pay | Admitting: Cardiology

## 2018-12-16 DIAGNOSIS — D509 Iron deficiency anemia, unspecified: Secondary | ICD-10-CM | POA: Diagnosis not present

## 2018-12-16 DIAGNOSIS — N186 End stage renal disease: Secondary | ICD-10-CM | POA: Diagnosis not present

## 2018-12-16 DIAGNOSIS — D631 Anemia in chronic kidney disease: Secondary | ICD-10-CM | POA: Diagnosis not present

## 2018-12-16 DIAGNOSIS — Z992 Dependence on renal dialysis: Secondary | ICD-10-CM | POA: Diagnosis not present

## 2018-12-16 DIAGNOSIS — N2581 Secondary hyperparathyroidism of renal origin: Secondary | ICD-10-CM | POA: Diagnosis not present

## 2018-12-18 ENCOUNTER — Telehealth: Payer: Self-pay | Admitting: Cardiology

## 2018-12-18 DIAGNOSIS — Z992 Dependence on renal dialysis: Secondary | ICD-10-CM | POA: Diagnosis not present

## 2018-12-18 DIAGNOSIS — N2581 Secondary hyperparathyroidism of renal origin: Secondary | ICD-10-CM | POA: Diagnosis not present

## 2018-12-18 DIAGNOSIS — D509 Iron deficiency anemia, unspecified: Secondary | ICD-10-CM | POA: Diagnosis not present

## 2018-12-18 DIAGNOSIS — D631 Anemia in chronic kidney disease: Secondary | ICD-10-CM | POA: Diagnosis not present

## 2018-12-18 DIAGNOSIS — N186 End stage renal disease: Secondary | ICD-10-CM | POA: Diagnosis not present

## 2018-12-18 NOTE — Telephone Encounter (Signed)
Virtual Visit Pre-Appointment Phone Call  "(Name), I am calling you today to discuss your upcoming appointment. We are currently trying to limit exposure to the virus that causes COVID-19 by seeing patients at home rather than in the office."  1. "What is the BEST phone number to call the day of the visit?" - include this in appointment notes  2. Do you have or have access to (through a family member/friend) a smartphone with video capability that we can use for your visit?" a. If yes - list this number in appt notes as cell (if different from BEST phone #) and list the appointment type as a VIDEO visit in appointment notes b. If no - list the appointment type as a PHONE visit in appointment notes  3. Confirm consent - "In the setting of the current Covid19 crisis, you are scheduled for a (phone or video) visit with your provider on (date) at (time).  Just as we do with many in-office visits, in order for you to participate in this visit, we must obtain consent.  If you'd like, I can send this to your mychart (if signed up) or email for you to review.  Otherwise, I can obtain your verbal consent now.  All virtual visits are billed to your insurance company just like a normal visit would be.  By agreeing to a virtual visit, we'd like you to understand that the technology does not allow for your provider to perform an examination, and thus may limit your provider's ability to fully assess your condition. If your provider identifies any concerns that need to be evaluated in person, we will make arrangements to do so.  Finally, though the technology is pretty good, we cannot assure that it will always work on either your or our end, and in the setting of a video visit, we may have to convert it to a phone-only visit.  In either situation, we cannot ensure that we have a secure connection.  Are you willing to proceed?" STAFF: Did the patient verbally acknowledge consent to telehealth visit? Document  YES/NO here: yes  4. Advise patient to be prepared - "Two hours prior to your appointment, go ahead and check your blood pressure, pulse, oxygen saturation, and your weight (if you have the equipment to check those) and write them all down. When your visit starts, your provider will ask you for this information. If you have an Apple Watch or Kardia device, please plan to have heart rate information ready on the day of your appointment. Please have a pen and paper handy nearby the day of the visit as well."  5. Give patient instructions for MyChart download to smartphone OR Doximity/Doxy.me as below if video visit (depending on what platform provider is using)  6. Inform patient they will receive a phone call 15 minutes prior to their appointment time (may be from unknown caller ID) so they should be prepared to answer    TELEPHONE CALL NOTE  Sihaam Sills has been deemed a candidate for a follow-up tele-health visit to limit community exposure during the Covid-19 pandemic. I spoke with the patient via phone to ensure availability of phone/video source, confirm preferred email & phone number, and discuss instructions and expectations.  I reminded Tekia Gillen to be prepared with any vital sign and/or heart rhythm information that could potentially be obtained via home monitoring, at the time of her visit. I reminded Emilia Wickard to expect a phone call prior to her visit.  Weston Anna 12/18/2018 4:44 PM   INSTRUCTIONS FOR DOWNLOADING THE MYCHART APP TO SMARTPHONE  - The patient must first make sure to have activated MyChart and know their login information - If Apple, go to [employer] and type in MyChart in the search bar and download the app. If Android, ask patient to go to [employer] and type in [place] in the search bar and download the app. The app is free but as with any other app downloads, their phone may require them to verify saved payment information or  Apple/Android password.  - The patient will need to then log into the app with their MyChart username and password, and select Sherry Cox as their healthcare provider to link the account. When it is time for your visit, go to the MyChart app, find appointments, and click Begin Video Visit. Be sure to Select Allow for your device to access the Microphone and Camera for your visit. You will then be connected, and your provider will be with you shortly.  **If they have any issues connecting, or need assistance please contact MyChart service desk [phone])**  **If using a computer, in order to ensure the best quality for their visit they will need to use either of the following Internet Browsers: [employer], or Google Chrome**  IF USING DOXIMITY or DOXY.ME - The patient will receive a link just prior to their visit by text.     FULL LENGTH CONSENT FOR TELE-HEALTH VISIT   I hereby voluntarily request, consent and authorize [place] and its employed or contracted physicians, physician assistants, nurse practitioners or other licensed health care professionals (the Practitioner), to provide me with telemedicine health care services (the Services") as deemed necessary by the treating Practitioner. I acknowledge and consent to receive the Services by the Practitioner via telemedicine. I understand that the telemedicine visit will involve communicating with the Practitioner through live audiovisual communication technology and the disclosure of certain medical information by electronic transmission. I acknowledge that I have been given the opportunity to request an in-person assessment or other available alternative prior to the telemedicine visit and am voluntarily participating in the telemedicine visit.  I understand that I have the right to withhold or withdraw my consent to the use of telemedicine in the course of my care at any time, without affecting my right to future care  or treatment, and that the Practitioner or I may terminate the telemedicine visit at any time. I understand that I have the right to inspect all information obtained and/or recorded in the course of the telemedicine visit and may receive copies of available information for a reasonable fee.  I understand that some of the potential risks of receiving the Services via telemedicine include:   Delay or interruption in medical evaluation due to technological equipment failure or disruption;  Information transmitted may not be sufficient (e.g. poor resolution of images) to allow for appropriate medical decision making by the Practitioner; and/or   In rare instances, security protocols could fail, causing a breach of personal health information.  Furthermore, I acknowledge that it is my responsibility to provide information about my medical history, conditions and care that is complete and accurate to the best of my ability. I acknowledge that Practitioner's advice, recommendations, and/or decision may be based on factors not within their control, such as incomplete or inaccurate data provided by me or distortions of diagnostic images or specimens that may result from electronic transmissions. I understand that the  practice of medicine is not an Chief Strategy Officer and that Practitioner makes no warranties or guarantees regarding treatment outcomes. I acknowledge that I will receive a copy of this consent concurrently upon execution via email to the email address I last provided but may also request a printed copy by calling the office of [place].    I understand that my insurance will be billed for this visit.   I have read or had this consent read to me.  I understand the contents of this consent, which adequately explains the benefits and risks of the Services being provided via telemedicine.   I have been provided ample opportunity to ask questions regarding this consent and the Services and have had  my questions answered to my satisfaction.  I give my informed consent for the services to be provided through the use of telemedicine in my medical care  By participating in this telemedicine visit I agree to the above.

## 2018-12-20 DIAGNOSIS — D631 Anemia in chronic kidney disease: Secondary | ICD-10-CM | POA: Diagnosis not present

## 2018-12-20 DIAGNOSIS — N186 End stage renal disease: Secondary | ICD-10-CM | POA: Diagnosis not present

## 2018-12-20 DIAGNOSIS — N2581 Secondary hyperparathyroidism of renal origin: Secondary | ICD-10-CM | POA: Diagnosis not present

## 2018-12-20 DIAGNOSIS — D509 Iron deficiency anemia, unspecified: Secondary | ICD-10-CM | POA: Diagnosis not present

## 2018-12-20 DIAGNOSIS — Z992 Dependence on renal dialysis: Secondary | ICD-10-CM | POA: Diagnosis not present

## 2018-12-23 DIAGNOSIS — N2581 Secondary hyperparathyroidism of renal origin: Secondary | ICD-10-CM | POA: Diagnosis not present

## 2018-12-23 DIAGNOSIS — Z992 Dependence on renal dialysis: Secondary | ICD-10-CM | POA: Diagnosis not present

## 2018-12-23 DIAGNOSIS — D509 Iron deficiency anemia, unspecified: Secondary | ICD-10-CM | POA: Diagnosis not present

## 2018-12-23 DIAGNOSIS — D631 Anemia in chronic kidney disease: Secondary | ICD-10-CM | POA: Diagnosis not present

## 2018-12-23 DIAGNOSIS — N186 End stage renal disease: Secondary | ICD-10-CM | POA: Diagnosis not present

## 2018-12-25 ENCOUNTER — Encounter: Payer: Self-pay | Admitting: Cardiology

## 2018-12-25 ENCOUNTER — Telehealth (INDEPENDENT_AMBULATORY_CARE_PROVIDER_SITE_OTHER): Payer: Medicare Other | Admitting: Cardiology

## 2018-12-25 VITALS — Ht <= 58 in | Wt 153.0 lb

## 2018-12-25 DIAGNOSIS — I2510 Atherosclerotic heart disease of native coronary artery without angina pectoris: Secondary | ICD-10-CM

## 2018-12-25 DIAGNOSIS — D631 Anemia in chronic kidney disease: Secondary | ICD-10-CM | POA: Diagnosis not present

## 2018-12-25 DIAGNOSIS — I6523 Occlusion and stenosis of bilateral carotid arteries: Secondary | ICD-10-CM

## 2018-12-25 DIAGNOSIS — N186 End stage renal disease: Secondary | ICD-10-CM | POA: Diagnosis not present

## 2018-12-25 DIAGNOSIS — E782 Mixed hyperlipidemia: Secondary | ICD-10-CM

## 2018-12-25 DIAGNOSIS — D509 Iron deficiency anemia, unspecified: Secondary | ICD-10-CM | POA: Diagnosis not present

## 2018-12-25 DIAGNOSIS — Z992 Dependence on renal dialysis: Secondary | ICD-10-CM | POA: Diagnosis not present

## 2018-12-25 DIAGNOSIS — N2581 Secondary hyperparathyroidism of renal origin: Secondary | ICD-10-CM | POA: Diagnosis not present

## 2018-12-25 DIAGNOSIS — I10 Essential (primary) hypertension: Secondary | ICD-10-CM

## 2018-12-25 DIAGNOSIS — I340 Nonrheumatic mitral (valve) insufficiency: Secondary | ICD-10-CM

## 2018-12-25 NOTE — Progress Notes (Signed)
Virtual Visit via Telephone Note   This visit type was conducted due to national recommendations for restrictions regarding the COVID-19 Pandemic (e.g. social distancing) in an effort to limit this patient's exposure and mitigate transmission in our community.  Due to her co-morbid illnesses, this patient is at least at moderate risk for complications without adequate follow up.  This format is felt to be most appropriate for this patient at this time.  The patient did not have access to video technology/had technical difficulties with video requiring transitioning to audio format only (telephone).  All issues noted in this document were discussed and addressed.  No physical exam could be performed with this format.  Please refer to the patient's chart for her  consent to telehealth for [facility].   Date:  12/25/2018   ID:  Ronald Pippins, DOB Aug 09, 1960, MRN [id number]  Patient Location: Home Provider Location: Office  PCP:  Janora Norlander, DO  Cardiologist:  Carlyle Dolly, MD  Electrophysiologist:  None   Evaluation Performed:  Follow-Up Visit  Chief Complaint: 6 month  Follow up  History of Present Illness:    [facility] is a 58 y.o. female seen today for follow up of the following medical problems.  1. CAD - prior CABG in 2007 - long history of chest pain. She has used SL NG regularly for many years since seeing her prior cardiologist, I have not been able to curb this habit.  - chronic chest pain better with NG which is chronic for her, uses regularly. Better with relaxing. Takes nitro up to 4-5 per day.   - denies any recent chest pain   2. SOB - on home O2 - mild LE edema though weights have actually been declining on HD - recent issues allergies.   3. Carotid stenosis - followed by vascular - 09/2017 carotid US RICA 13-08%, LICA 65-78%  10/6960 carotid US: RICA 9-52%, LICA 84-13% ]- no recent symptoms   4. ESRD - compliant with HD,  tolerating sessions without troubles.   5. Hyperlipidemia - 09/2016 TC 110 HDL 55 LDL 39 - she is compliant with statin   5. HTN -compliant with meds  6. Valvular heart disease -09/2018 echo moderate MR, TR mod to severe. PASP 61 Mild chronic edema unchanged.    7. 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 Poorly visualized atrial septum, does appear to be a left to right shunt in area of PFO vs secundum ASD. By my review no significant RV enlargement or dysfunction. Asymptomatic   8. Chronic pain syndrome - followed by pcp   The patient does not have symptoms concerning for COVID-19 infection (fever, chills, cough, or new shortness of breath).    Past Medical History:  Diagnosis Date  . Ankle injury   . Anxiety   . Anxiety disorder   . CAD (coronary artery disease)    Total RCA, stress echo [facility] 11/2008-no ischemia, EF 55%; h/o CABG  . Carotid artery stenosis    12/10/2008 doppler 24-40% RICA, 1-02% LICA/see evaluation by Dr. Oneida Alar 2011  . Ejection fraction    EF 60-65%, echo, June, 2009  . ESRD on dialysis [facility])    Transplant consideration  . Hemochromatosis   . Hx of CABG    Off pump LIMA to LAD  05/2006  . Hx of tobacco use, presenting hazards to health 04/26/2012  . Hyperlipidemia   . Irregular heart beat   . Murmur    October, 2012  .  Neuropathy   . Overweight(278.02)   . Tobacco abuse    Past Surgical History:  Procedure Laterality Date  . AV FISTULA PLACEMENT     Has HD on Tues,Thurs, and Saturday at [facility]  . CARDIAC VALVE SURGERY  2007  . CAROTID ANGIOGRAM Bilateral 03/06/2013   Procedure: CAROTID ANGIOGRAM;  Surgeon: Elam Dutch, MD;  Location: [facility] C[facility] LAB;  Service: Cardiovascular;  Laterality: Bilateral;  . CHOLECYSTECTOMY  1995   Gall Bladder  . CORONARY ARTERY BYPASS GRAFT  05/2006   Off pump LIMA to LAD  . INNER EAR SURGERY    . ORIF PATELLA Right 03/11/2017   Procedure: OPEN REDUCTION INTERNAL (ORIF) FIXATION  PATELLA;  Surgeon: Rod Can, MD;  Location: [place];  Service: Orthopedics;  Laterality: Right;  . Plastic Surgery on leg       No outpatient medications have been marked as taking for the 12/25/18 encounter (Appointment) with Arnoldo Lenis, MD.     Allergies:   Augmentin [amoxicillin-pot clavulanate]   Social History   Tobacco Use  . Smoking status: Current Some Day Smoker    Packs/day: 0.10    Years: 30.00    Pack years: 3.00    Types: Cigarettes    Start date: 02/02/1972  . Smokeless tobacco: Never Used  Substance Use Topics  . Alcohol use: No    Alcohol/week: 0.0 standard drinks  . Drug use: No     Family Hx: The patient's family history includes ADD / ADHD in an other family member; Alcohol abuse in her father and another family member; Bipolar disorder in her brother and other family members; COPD in her brother; Dementia in her maternal aunt, mother, and sister; Depression in her sister; Diabetes in her brother, sister, and sister; Drug abuse in her brother; Heart attack in her brother, brother, father, mother, and sister; Heart disease in her brother, father, mother, and sister; Hemochromatosis in her brother and sister; Hypertension in her brother, father, mother, sister, and sister; OCD in her sister and sister; Rheum arthritis in her sister; Seizures in an other family member. There is no history of Paranoid behavior, Schizophrenia, Sexual abuse, or Physical abuse.  ROS:   Please see the history of present illness.     All other systems reviewed and are negative.   Prior CV studies:   The following studies were reviewed today:  04/2011 echo Study Conclusions  - Left ventricle: The cavity size was normal. Systolic function was normal. The estimated ejection fraction was in the range of 60% to 65%. Wall motion was normal; there were no regional wall motion abnormalities. Doppler parameters are consistent with abnormal left ventricular relaxation  (grade 1 diastolic dysfunction). - Aortic valve: Valve area: 1.38cm^2(VTI). Valve area: 1.13cm^2 (Vmax).   01/2015 Carotid US RICA 40-59%, left bifurcatoin 40-59%   09/2017 echo Study Conclusions  - Left ventricle: The cavity size was normal. Wall thickness was normal. The estimated ejection fraction was 55%. There is hypokinesis of the basal-midinferolateral and inferior myocardium. The study is not technically sufficient to allow evaluation of LV diastolic function. - Aortic valve: Poorly visualized. Unable to assess leaflet structure. Moderately calcified. - Mitral valve: Mildly to moderately calcified annulus. There was moderate regurgitation. - Left atrium: The atrium was severely dilated. - Right ventricle: The cavity size was mildly dilated. - Right atrium: Central venous pressure (est): 3 mm Hg. - Atrial septum: Apparent secundum ASD with left to right flow by color Doppler. - Tricuspid valve: There  was mild regurgitation. - Pulmonary arteries: Systolic pressure was mildly increased. PA peak pressure: 41 mm Hg (S). - Pericardium, extracardiac: There was no pericardial effusion.   09/2017 carotid US Final Interpretation: Right Carotid: Velocities in the right ICA are consistent with a 40-59%        stenosis. The ECA appears >50% stenosed. Based on peak systolic        velocity and moderate plaque formation.  Left Carotid: Velocities in the left ICA are consistent with a 40-59% stenosis.       The ECA appears >50% stenosed. Upper end of range based on peak       systolic velocity and plaque fromation.  Vertebrals: Right vertebral artery demonstrates antegrade flow. Left vertebral       artery demonstrates bidirectional flow. Left AVF. Subclavians: Bilateral subclavian artery flow was disturbed. Left AVF.  09/2018 carotid US Summary: Right Carotid: Velocities in the right ICA are consistent with a 1-39%  stenosis.                The ECA appears >50% stenosed.  Left Carotid: Velocities in the left ICA are consistent with a 40-59% stenosis.               The ECA appears >50% stenosed. Peak systolic velocity is 4.54 but               diastolic low consider CTA to further evaluate  Vertebrals:  Right vertebral artery demonstrates antegrade flow. Left vertebral              artery demonstrates retrograde flow. Subclavians: Bilateral subclavian artery flow was disturbed.    09/2018 echo 1. The left ventricle has low normal systolic function, with an ejection fraction of 50-55%. The cavity size was normal. There is moderately increased left ventricular wall thickness. Left ventricular diastolic Doppler parameters are consistent with  pseudonormalization. Elevated mean left atrial pressure.  2. The right ventricle has low normal systolic function. The cavity was mildly enlarged. There is no increase in right ventricular wall thickness.  3. Left atrial size was severely dilated.  4. Right atrial size was moderately dilated.  5. The mitral valve is abnormal. Moderate thickening of the mitral valve leaflet. Moderate calcification of the mitral valve leaflet. There is moderate mitral annular calcification present. Mitral valve regurgitation is moderate by color flow Doppler.  6. Tricuspid valve regurgitation is moderate-severe.  7. The aortic valve was not well visualized no stenosis of the aortic valve.  8. Pulmonary hypertension is moderately elevated, PASP is 61 mmHg.  9. The inferior vena cava was dilated in size with <50% respiratory variability. 10. The interatrial septum was not well visualized. 11. There is left bowing of the interatrial septum, suggestive of elevated right atrial pressure.  Labs/Other Tests and Data Reviewed:    EKG:  No ECG reviewed.  Recent Labs: 03/26/2018: BUN 19; Creatinine, Ser 5.58; Hemoglobin 10.7; Platelets 224; Potassium 5.1; Sodium 135 05/12/2018: TSH 1.440    Recent Lipid Panel No results found for: CHOL, TRIG, HDL, CHOLHDL, LDLCALC, LDLDIRECT  Wt Readings from Last 3 Encounters:  09/15/18 167 lb (75.8 kg)  08/13/18 165 lb (74.8 kg)  06/18/18 175 lb 3.2 oz (79.5 kg)     Objective:    Vital Signs:   Today's Vitals   12/25/18 1031  Weight: 153 lb (69.4 kg)  Height: 4\' 10"  (1.473 m)   Body mass index is 31.98 kg/m.  Normall affect. Normal speech pattern and tone.  Comfortable, no apparent distress. No audibel signs of SOB or wheezing.   ASSESSMENT & PLAN:    1. CAD -chronic frequent SL NG use she established with her previous provider. I have not been able to get her to decrease this habit.  - no recent chest pain, continue current meds   2. Carotid stenosis - mild to mod disease bilaterally asymptomatic - continue medical therpay  3. HTN - has been controlled, continue current meds  4. Hyperlipidemia -continue statin  5. Mitral regurgitation - moderate MR, we will continue to monitor  6. ASD - probable secundum ASD. Asymptomatic, no clear RV dysfunction or enlargement by my review of her echo - continue to monitor.   7. SOB - asked to discuss with her pcp. Has been on home O2 per pcp, has had some O2 sats low 90s. From history nothing to support fluid overload as she is consistent with her HD sessions and has actually been losing weight, no history to suggest significant fluid overload.  COVID-19 Education: The signs and symptoms of COVID-19 were discussed with the patient and how to seek care for testing (follow up with PCP or arrange E-visit).  The importance of social distancing was discussed today.  Time:   Today, I have spent 20 minutes with the patient with telehealth technology discussing the above problems.     Medication Adjustments/Labs and Tests Ordered: Current medicines are reviewed at length with the patient today.  Concerns regarding medicines are outlined above.   Tests Ordered: No orders  of the defined types were placed in this encounter.   Medication Changes: No orders of the defined types were placed in this encounter.   Follow Up:  In Person in 6 month(s)  Signed, Carlyle Dolly, MD  12/25/2018 9:08 AM    [place]

## 2018-12-25 NOTE — Patient Instructions (Signed)

## 2018-12-26 ENCOUNTER — Encounter: Payer: Self-pay | Admitting: *Deleted

## 2018-12-27 DIAGNOSIS — Z992 Dependence on renal dialysis: Secondary | ICD-10-CM | POA: Diagnosis not present

## 2018-12-27 DIAGNOSIS — D631 Anemia in chronic kidney disease: Secondary | ICD-10-CM | POA: Diagnosis not present

## 2018-12-27 DIAGNOSIS — D509 Iron deficiency anemia, unspecified: Secondary | ICD-10-CM | POA: Diagnosis not present

## 2018-12-27 DIAGNOSIS — N186 End stage renal disease: Secondary | ICD-10-CM | POA: Diagnosis not present

## 2018-12-27 DIAGNOSIS — N2581 Secondary hyperparathyroidism of renal origin: Secondary | ICD-10-CM | POA: Diagnosis not present

## 2018-12-30 DIAGNOSIS — D509 Iron deficiency anemia, unspecified: Secondary | ICD-10-CM | POA: Diagnosis not present

## 2018-12-30 DIAGNOSIS — Z992 Dependence on renal dialysis: Secondary | ICD-10-CM | POA: Diagnosis not present

## 2018-12-30 DIAGNOSIS — N186 End stage renal disease: Secondary | ICD-10-CM | POA: Diagnosis not present

## 2018-12-30 DIAGNOSIS — N2581 Secondary hyperparathyroidism of renal origin: Secondary | ICD-10-CM | POA: Diagnosis not present

## 2018-12-30 DIAGNOSIS — D631 Anemia in chronic kidney disease: Secondary | ICD-10-CM | POA: Diagnosis not present

## 2018-12-31 ENCOUNTER — Other Ambulatory Visit: Payer: Self-pay | Admitting: Cardiology

## 2018-12-31 DIAGNOSIS — I6523 Occlusion and stenosis of bilateral carotid arteries: Secondary | ICD-10-CM

## 2019-01-01 ENCOUNTER — Other Ambulatory Visit: Payer: Self-pay | Admitting: Cardiology

## 2019-01-01 ENCOUNTER — Other Ambulatory Visit: Payer: Self-pay | Admitting: Family Medicine

## 2019-01-01 DIAGNOSIS — D631 Anemia in chronic kidney disease: Secondary | ICD-10-CM | POA: Diagnosis not present

## 2019-01-01 DIAGNOSIS — D509 Iron deficiency anemia, unspecified: Secondary | ICD-10-CM | POA: Diagnosis not present

## 2019-01-01 DIAGNOSIS — N2581 Secondary hyperparathyroidism of renal origin: Secondary | ICD-10-CM | POA: Diagnosis not present

## 2019-01-01 DIAGNOSIS — N186 End stage renal disease: Secondary | ICD-10-CM | POA: Diagnosis not present

## 2019-01-01 DIAGNOSIS — Z992 Dependence on renal dialysis: Secondary | ICD-10-CM | POA: Diagnosis not present

## 2019-01-02 ENCOUNTER — Other Ambulatory Visit: Payer: Self-pay | Admitting: *Deleted

## 2019-01-02 ENCOUNTER — Telehealth: Payer: Self-pay | Admitting: Cardiology

## 2019-01-02 IMAGING — DX DG KNEE COMPLETE 4+V*R*
4 series · 4 of 4 positions shown · non-contrast
Comparison: None.

CLINICAL DATA: Fall onto knee

EXAM:
RIGHT KNEE - COMPLETE 4+ VIEW

[x knee ap right]
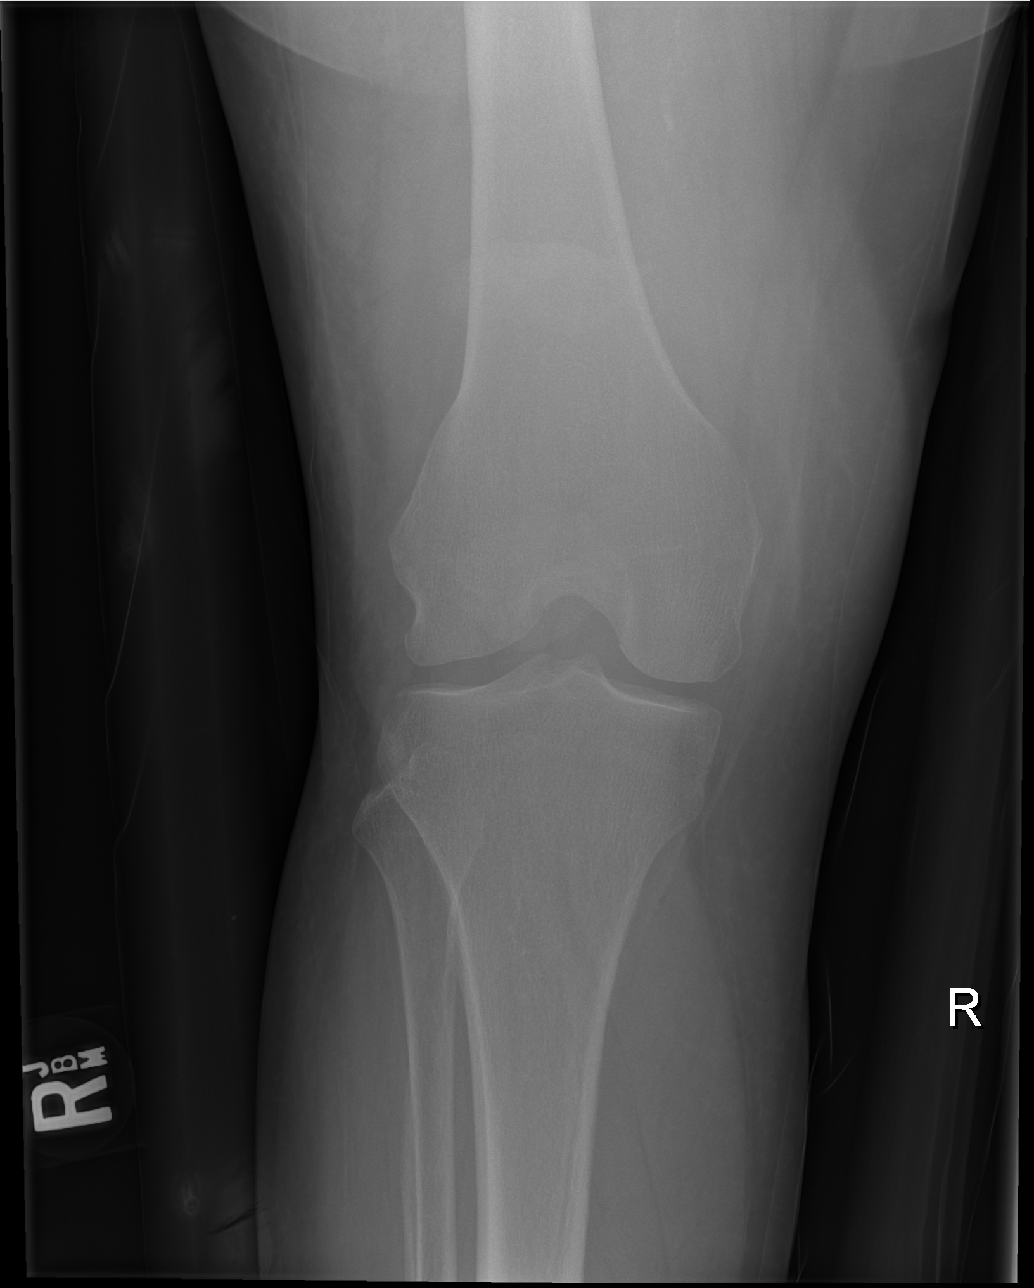

[x knee obl right (1 of 2)]
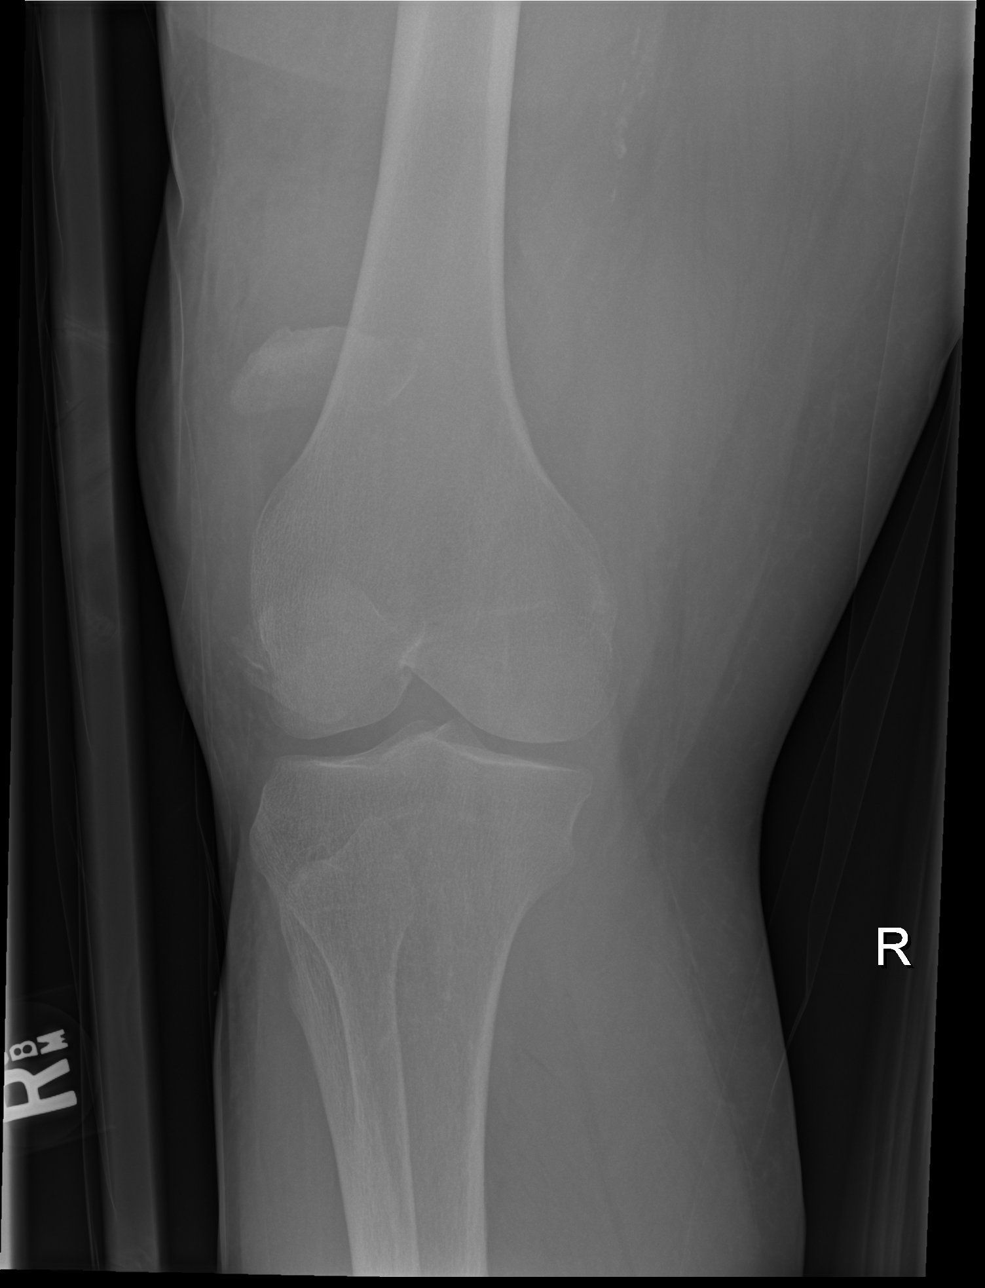

[x knee obl right (2 of 2)]
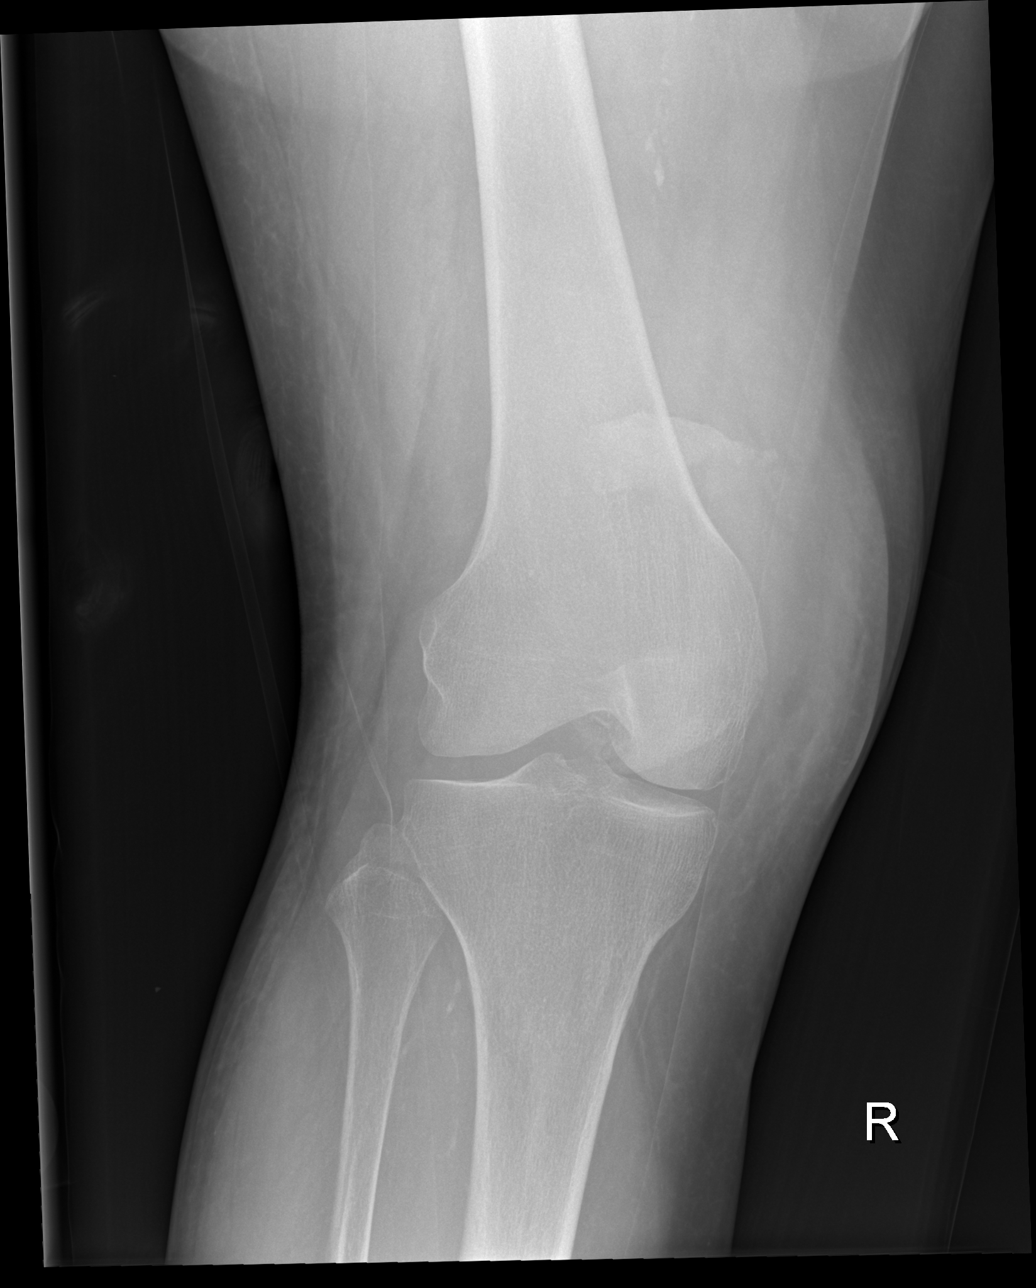

[x knee lat right]
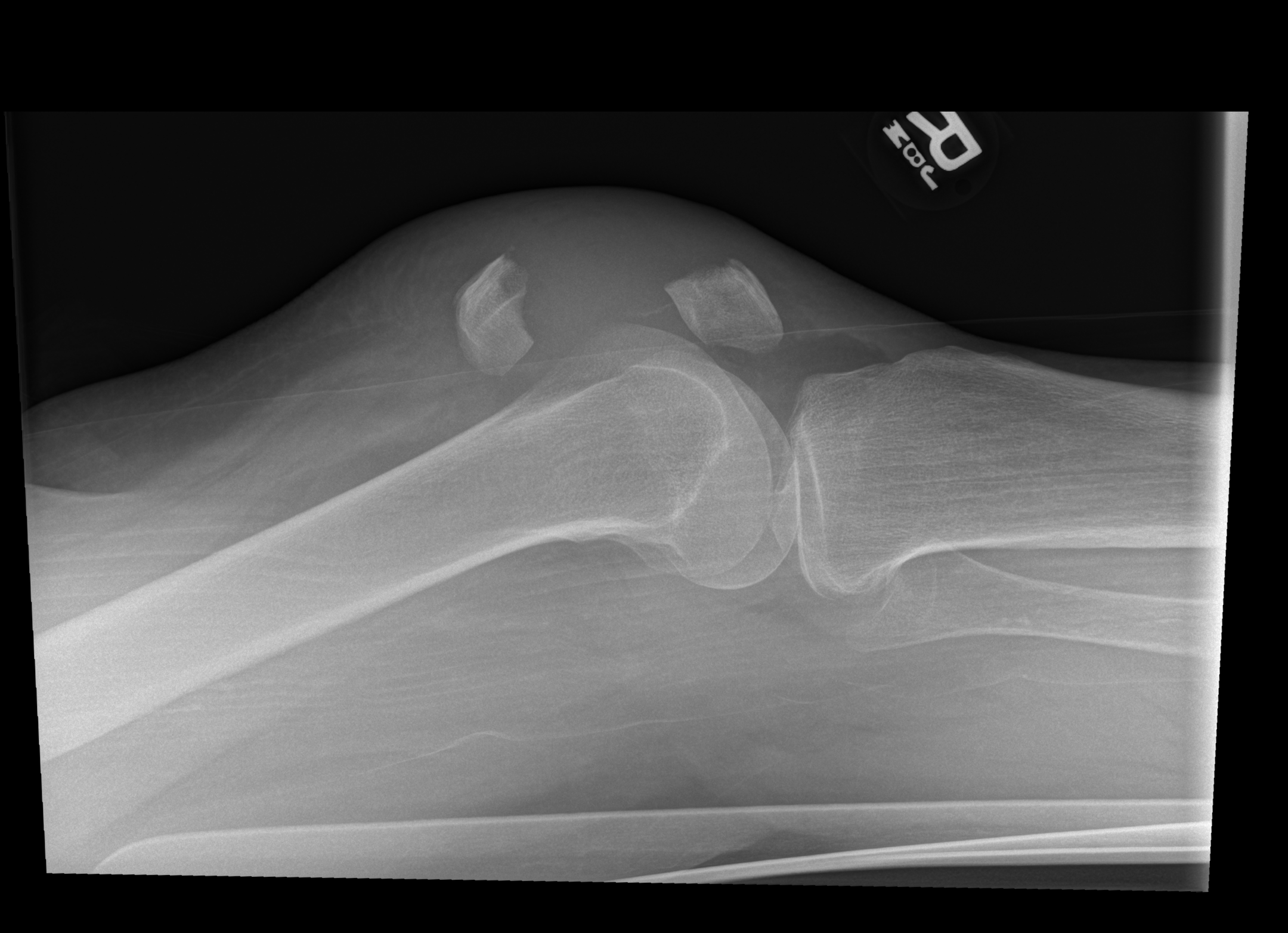

[4 of 4 positions shown; findings below may reference images not displayed]

FINDINGS: There is a fracture horizontally through the body of the patella
with distraction of the fracture fragments by 3 cm. Prepatellar soft
tissue swelling. No fracture the tibia or femur.
IMPRESSION: Horizontal fracture through the midbody of the patella with 3 cm of
distraction

## 2019-01-02 MED ORDER — NITROGLYCERIN 0.4 MG SL SUBL: SUBLINGUAL_TABLET | SUBLINGUAL | 3 refills | Status: AC

## 2019-01-02 NOTE — Telephone Encounter (Signed)
Addressed already - refill sent to [facility] for # 75 tabs.  Patient aware.

## 2019-01-02 NOTE — Telephone Encounter (Signed)
Pharmacy telling her that they have not gotten ok to fill her RX early for [employer]

## 2019-01-03 DIAGNOSIS — Z992 Dependence on renal dialysis: Secondary | ICD-10-CM | POA: Diagnosis not present

## 2019-01-03 DIAGNOSIS — D631 Anemia in chronic kidney disease: Secondary | ICD-10-CM | POA: Diagnosis not present

## 2019-01-03 DIAGNOSIS — N2581 Secondary hyperparathyroidism of renal origin: Secondary | ICD-10-CM | POA: Diagnosis not present

## 2019-01-03 DIAGNOSIS — D509 Iron deficiency anemia, unspecified: Secondary | ICD-10-CM | POA: Diagnosis not present

## 2019-01-03 DIAGNOSIS — N186 End stage renal disease: Secondary | ICD-10-CM | POA: Diagnosis not present

## 2019-01-03 IMAGING — RF DG C-ARM 61-120 MIN
1 series · 2 of 2 positions shown · non-contrast
Comparison: Right knee CT 03/10/2017

FLUOROSCOPY TIME:  0 minutes 30 seconds

CLINICAL DATA: 56-year-old female undergoing ORIF right patella
fracture.

EXAM:
RIGHT KNEE - 1-2 VIEW; DG C-ARM 61-120 MIN

[Series 1: run · 2 of 2 slices shown]
[im 1/2]
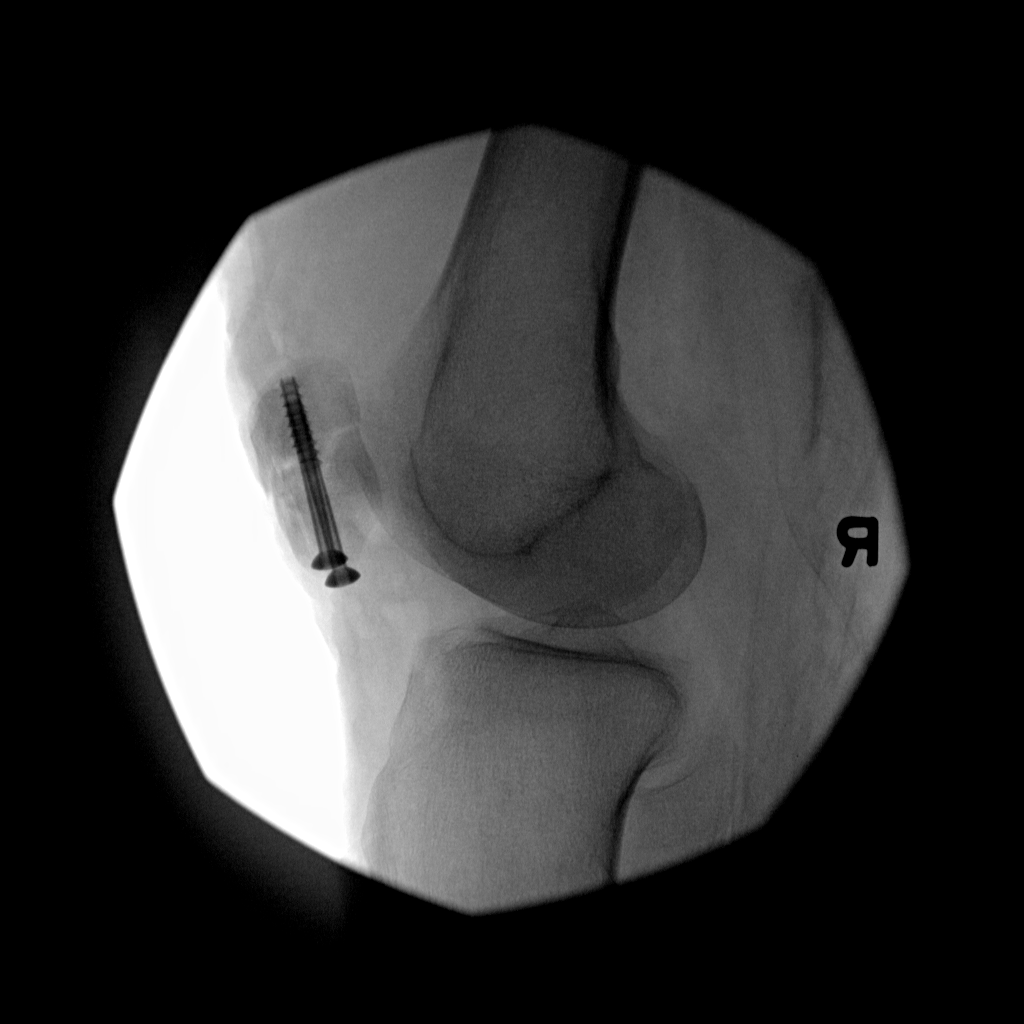
[im 2/2]
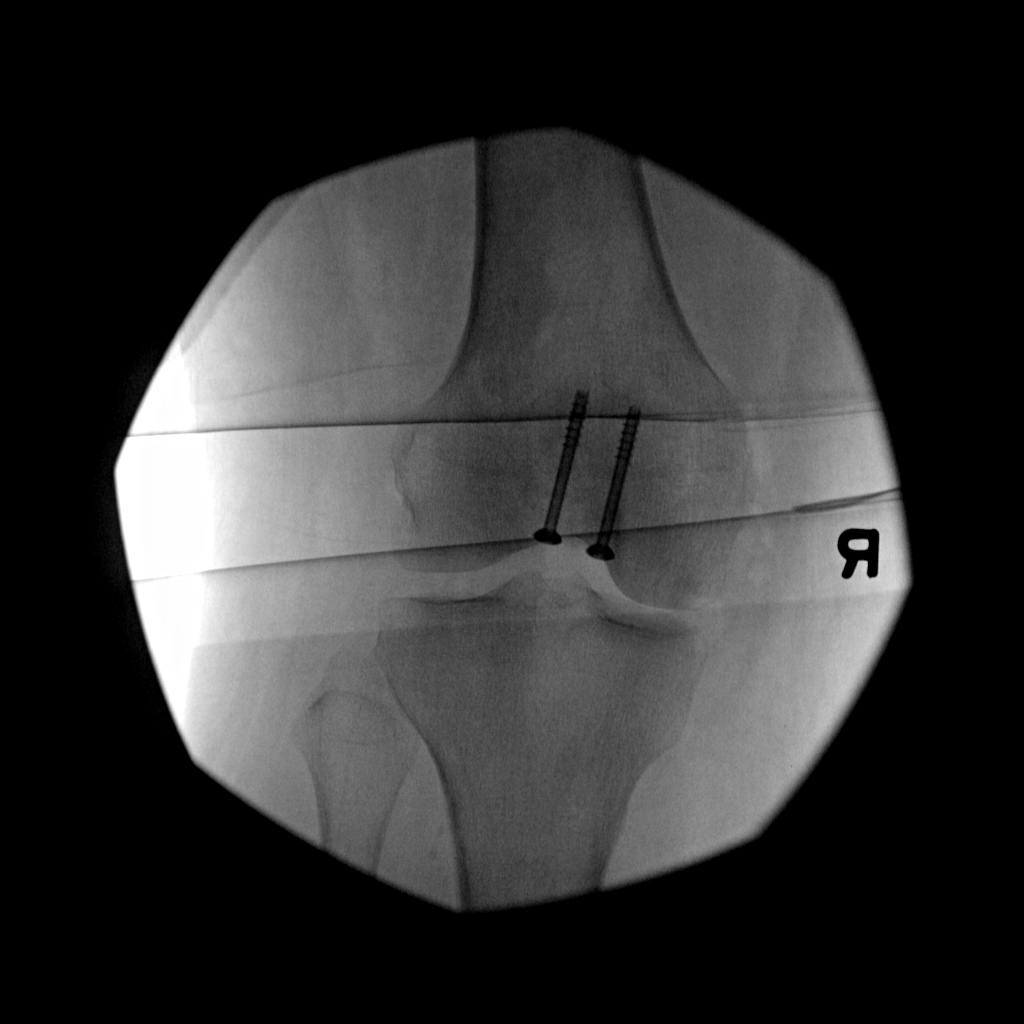

[2 of 2 positions shown; findings below may reference images not displayed]

FINDINGS: Intraoperative fluoroscopic spot views of the right knee in the AP
and lateral projection. Two cannulated screws traverse the
horizontal mid patella fracture. Fracture alignment appears near
anatomic. The hardware appears intact.
IMPRESSION: Right patella ORIF with no adverse features.

## 2019-01-06 DIAGNOSIS — D631 Anemia in chronic kidney disease: Secondary | ICD-10-CM | POA: Diagnosis not present

## 2019-01-06 DIAGNOSIS — Z992 Dependence on renal dialysis: Secondary | ICD-10-CM | POA: Diagnosis not present

## 2019-01-06 DIAGNOSIS — N186 End stage renal disease: Secondary | ICD-10-CM | POA: Diagnosis not present

## 2019-01-06 DIAGNOSIS — N2581 Secondary hyperparathyroidism of renal origin: Secondary | ICD-10-CM | POA: Diagnosis not present

## 2019-01-06 DIAGNOSIS — D509 Iron deficiency anemia, unspecified: Secondary | ICD-10-CM | POA: Diagnosis not present

## 2019-01-07 DIAGNOSIS — N186 End stage renal disease: Secondary | ICD-10-CM | POA: Diagnosis not present

## 2019-01-07 DIAGNOSIS — Z992 Dependence on renal dialysis: Secondary | ICD-10-CM | POA: Diagnosis not present

## 2019-01-08 ENCOUNTER — Other Ambulatory Visit: Payer: Self-pay

## 2019-01-08 ENCOUNTER — Ambulatory Visit (INDEPENDENT_AMBULATORY_CARE_PROVIDER_SITE_OTHER): Payer: Medicare Other | Admitting: Family Medicine

## 2019-01-08 ENCOUNTER — Encounter: Payer: Self-pay | Admitting: Family Medicine

## 2019-01-08 DIAGNOSIS — N2581 Secondary hyperparathyroidism of renal origin: Secondary | ICD-10-CM | POA: Diagnosis not present

## 2019-01-08 DIAGNOSIS — Z992 Dependence on renal dialysis: Secondary | ICD-10-CM | POA: Diagnosis not present

## 2019-01-08 DIAGNOSIS — I6523 Occlusion and stenosis of bilateral carotid arteries: Secondary | ICD-10-CM | POA: Diagnosis not present

## 2019-01-08 DIAGNOSIS — H66001 Acute suppurative otitis media without spontaneous rupture of ear drum, right ear: Secondary | ICD-10-CM | POA: Diagnosis not present

## 2019-01-08 DIAGNOSIS — N186 End stage renal disease: Secondary | ICD-10-CM | POA: Diagnosis not present

## 2019-01-08 DIAGNOSIS — D631 Anemia in chronic kidney disease: Secondary | ICD-10-CM | POA: Diagnosis not present

## 2019-01-08 DIAGNOSIS — D509 Iron deficiency anemia, unspecified: Secondary | ICD-10-CM | POA: Diagnosis not present

## 2019-01-08 MED ORDER — CEFDINIR 300 MG PO CAPS: ORAL_CAPSULE | ORAL | 0 refills | Status: AC

## 2019-01-08 NOTE — Progress Notes (Signed)
   Virtual Visit via telephone Note  I connected with [facility] on 01/08/19 at 1344 by telephone and verified that I am speaking with the correct person using two identifiers. [facility] is currently located at home and no other people are currently with her during visit. The provider, Fransisca Kaufmann Reika Callanan, MD is located in their office at time of visit.  Call ended at 1355  I discussed the limitations, risks, security and privacy concerns of performing an evaluation and management service by telephone and the availability of in person appointments. I also discussed with the patient that there may be a patient responsible charge related to this service. The patient expressed understanding and agreed to proceed.   History and Present Illness: Patient calls in with the complaints of her ear hurting.  It did it once a month ago and then went away.  She said it started up 2 weeks ago.  She did have sinus congestion going on and used flonase and it improved.  She is using sweet oil drops currently. She still has a small amount of sinus drainage.  She says her right ear hurts.  She says sometimes it can be severe at times.   No diagnosis found.    Review of Systems  Constitutional: Negative for chills and fever.  HENT: Positive for congestion, ear pain, postnasal drip, sinus pressure and sneezing. Negative for ear discharge, rhinorrhea and sore throat.   Eyes: Negative for pain, redness and visual disturbance.  Respiratory: Positive for cough. Negative for chest tightness and shortness of breath.   Cardiovascular: Negative for chest pain and leg swelling.  Musculoskeletal: Negative for back pain and gait problem.  Skin: Negative for color change and rash.  Neurological: Negative for light-headedness and headaches.  Psychiatric/Behavioral: Negative for agitation and behavioral problems.  All other systems reviewed and are negative.   Observations/Objective: Patient sounds  comfortable and in no acute distress  Assessment and Plan: Problem List Items Addressed This Visit    None    Visit Diagnoses    Acute suppurative otitis media of right ear    -  Primary   Relevant Medications   cefdinir (OMNICEF) 300 MG capsule       Follow Up Instructions:  Sending for coronavirus testing because she has had some sinus nasal congestion symptoms  We will treat like right otitis media based on symptoms on the phone with [facility] which she is going to take a dose after dialysis on the dialysis days.  Recommended self quarantine until she hears back from the testing   I discussed the assessment and treatment plan with the patient. The patient was provided an opportunity to ask questions and all were answered. The patient agreed with the plan and demonstrated an understanding of the instructions.   The patient was advised to call back or seek an in-person evaluation if the symptoms worsen or if the condition fails to improve as anticipated.  The above assessment and management plan was discussed with the patient. The patient verbalized understanding of and has agreed to the management plan. Patient is aware to call the clinic if symptoms persist or worsen. Patient is aware when to return to the clinic for a follow-up visit. Patient educated on when it is appropriate to go to the emergency department.    I provided 11 minutes of non-face-to-face time during this encounter.    Worthy Rancher, MD

## 2019-01-10 DIAGNOSIS — D631 Anemia in chronic kidney disease: Secondary | ICD-10-CM | POA: Diagnosis not present

## 2019-01-10 DIAGNOSIS — Z992 Dependence on renal dialysis: Secondary | ICD-10-CM | POA: Diagnosis not present

## 2019-01-10 DIAGNOSIS — N2581 Secondary hyperparathyroidism of renal origin: Secondary | ICD-10-CM | POA: Diagnosis not present

## 2019-01-10 DIAGNOSIS — D509 Iron deficiency anemia, unspecified: Secondary | ICD-10-CM | POA: Diagnosis not present

## 2019-01-10 DIAGNOSIS — N186 End stage renal disease: Secondary | ICD-10-CM | POA: Diagnosis not present

## 2019-01-13 DIAGNOSIS — N2581 Secondary hyperparathyroidism of renal origin: Secondary | ICD-10-CM | POA: Diagnosis not present

## 2019-01-13 DIAGNOSIS — D631 Anemia in chronic kidney disease: Secondary | ICD-10-CM | POA: Diagnosis not present

## 2019-01-13 DIAGNOSIS — N186 End stage renal disease: Secondary | ICD-10-CM | POA: Diagnosis not present

## 2019-01-13 DIAGNOSIS — Z992 Dependence on renal dialysis: Secondary | ICD-10-CM | POA: Diagnosis not present

## 2019-01-13 DIAGNOSIS — D509 Iron deficiency anemia, unspecified: Secondary | ICD-10-CM | POA: Diagnosis not present

## 2019-01-15 DIAGNOSIS — D509 Iron deficiency anemia, unspecified: Secondary | ICD-10-CM | POA: Diagnosis not present

## 2019-01-15 DIAGNOSIS — D631 Anemia in chronic kidney disease: Secondary | ICD-10-CM | POA: Diagnosis not present

## 2019-01-15 DIAGNOSIS — N2581 Secondary hyperparathyroidism of renal origin: Secondary | ICD-10-CM | POA: Diagnosis not present

## 2019-01-15 DIAGNOSIS — Z992 Dependence on renal dialysis: Secondary | ICD-10-CM | POA: Diagnosis not present

## 2019-01-15 DIAGNOSIS — N186 End stage renal disease: Secondary | ICD-10-CM | POA: Diagnosis not present

## 2019-01-17 ENCOUNTER — Other Ambulatory Visit: Payer: Self-pay | Admitting: Family Medicine

## 2019-01-17 DIAGNOSIS — D631 Anemia in chronic kidney disease: Secondary | ICD-10-CM | POA: Diagnosis not present

## 2019-01-17 DIAGNOSIS — D509 Iron deficiency anemia, unspecified: Secondary | ICD-10-CM | POA: Diagnosis not present

## 2019-01-17 DIAGNOSIS — N186 End stage renal disease: Secondary | ICD-10-CM | POA: Diagnosis not present

## 2019-01-17 DIAGNOSIS — Z992 Dependence on renal dialysis: Secondary | ICD-10-CM | POA: Diagnosis not present

## 2019-01-17 DIAGNOSIS — N2581 Secondary hyperparathyroidism of renal origin: Secondary | ICD-10-CM | POA: Diagnosis not present

## 2019-01-20 DIAGNOSIS — Z992 Dependence on renal dialysis: Secondary | ICD-10-CM | POA: Diagnosis not present

## 2019-01-20 DIAGNOSIS — N186 End stage renal disease: Secondary | ICD-10-CM | POA: Diagnosis not present

## 2019-01-20 DIAGNOSIS — D509 Iron deficiency anemia, unspecified: Secondary | ICD-10-CM | POA: Diagnosis not present

## 2019-01-20 DIAGNOSIS — D631 Anemia in chronic kidney disease: Secondary | ICD-10-CM | POA: Diagnosis not present

## 2019-01-20 DIAGNOSIS — N2581 Secondary hyperparathyroidism of renal origin: Secondary | ICD-10-CM | POA: Diagnosis not present

## 2019-01-22 DIAGNOSIS — D631 Anemia in chronic kidney disease: Secondary | ICD-10-CM | POA: Diagnosis not present

## 2019-01-22 DIAGNOSIS — N186 End stage renal disease: Secondary | ICD-10-CM | POA: Diagnosis not present

## 2019-01-22 DIAGNOSIS — Z992 Dependence on renal dialysis: Secondary | ICD-10-CM | POA: Diagnosis not present

## 2019-01-22 DIAGNOSIS — D509 Iron deficiency anemia, unspecified: Secondary | ICD-10-CM | POA: Diagnosis not present

## 2019-01-22 DIAGNOSIS — N2581 Secondary hyperparathyroidism of renal origin: Secondary | ICD-10-CM | POA: Diagnosis not present

## 2019-01-23 ENCOUNTER — Telehealth: Payer: Self-pay | Admitting: Family Medicine

## 2019-01-23 NOTE — Telephone Encounter (Signed)
Please advise 

## 2019-01-23 NOTE — Telephone Encounter (Signed)
Patient wants to speak with Dr. Darnell Level states she is no better and wants to know if she can come in for her ear pain states she has already done phone visit and is no better.

## 2019-01-23 NOTE — Telephone Encounter (Signed)
If she is having cough, rhinorrhea, etc she cannot come in.  She can be checked out at [facility] in [place] if she wants to be checked out.

## 2019-01-24 DIAGNOSIS — N186 End stage renal disease: Secondary | ICD-10-CM | POA: Diagnosis not present

## 2019-01-24 DIAGNOSIS — Z992 Dependence on renal dialysis: Secondary | ICD-10-CM | POA: Diagnosis not present

## 2019-01-24 DIAGNOSIS — D509 Iron deficiency anemia, unspecified: Secondary | ICD-10-CM | POA: Diagnosis not present

## 2019-01-24 DIAGNOSIS — N2581 Secondary hyperparathyroidism of renal origin: Secondary | ICD-10-CM | POA: Diagnosis not present

## 2019-01-24 DIAGNOSIS — D631 Anemia in chronic kidney disease: Secondary | ICD-10-CM | POA: Diagnosis not present

## 2019-01-26 NOTE — Telephone Encounter (Signed)
Patient aware.

## 2019-01-27 DIAGNOSIS — D509 Iron deficiency anemia, unspecified: Secondary | ICD-10-CM | POA: Diagnosis not present

## 2019-01-27 DIAGNOSIS — N186 End stage renal disease: Secondary | ICD-10-CM | POA: Diagnosis not present

## 2019-01-27 DIAGNOSIS — Z992 Dependence on renal dialysis: Secondary | ICD-10-CM | POA: Diagnosis not present

## 2019-01-27 DIAGNOSIS — D631 Anemia in chronic kidney disease: Secondary | ICD-10-CM | POA: Diagnosis not present

## 2019-01-27 DIAGNOSIS — N2581 Secondary hyperparathyroidism of renal origin: Secondary | ICD-10-CM | POA: Diagnosis not present

## 2019-01-28 ENCOUNTER — Other Ambulatory Visit: Payer: Self-pay

## 2019-01-28 ENCOUNTER — Emergency Department (HOSPITAL_COMMUNITY): Payer: Medicare Other

## 2019-01-28 ENCOUNTER — Encounter (HOSPITAL_COMMUNITY): Payer: Self-pay

## 2019-01-28 ENCOUNTER — Emergency Department (HOSPITAL_COMMUNITY)
Admission: EM | Admit: 2019-01-28 | Discharge: 2019-01-28 | Disposition: A | Discharge status: Home / Self Care | Payer: Medicare Other | Attending: Emergency Medicine | Admitting: Emergency Medicine

## 2019-01-28 DIAGNOSIS — W19XXXA Unspecified fall, initial encounter: Secondary | ICD-10-CM

## 2019-01-28 DIAGNOSIS — I2510 Atherosclerotic heart disease of native coronary artery without angina pectoris: Secondary | ICD-10-CM | POA: Insufficient documentation

## 2019-01-28 DIAGNOSIS — R51 Headache: Secondary | ICD-10-CM | POA: Diagnosis not present

## 2019-01-28 DIAGNOSIS — S0990XA Unspecified injury of head, initial encounter: Secondary | ICD-10-CM | POA: Insufficient documentation

## 2019-01-28 DIAGNOSIS — Z992 Dependence on renal dialysis: Secondary | ICD-10-CM | POA: Diagnosis not present

## 2019-01-28 DIAGNOSIS — E039 Hypothyroidism, unspecified: Secondary | ICD-10-CM | POA: Insufficient documentation

## 2019-01-28 DIAGNOSIS — S199XXA Unspecified injury of neck, initial encounter: Secondary | ICD-10-CM | POA: Diagnosis not present

## 2019-01-28 DIAGNOSIS — I120 Hypertensive chronic kidney disease with stage 5 chronic kidney disease or end stage renal disease: Secondary | ICD-10-CM | POA: Insufficient documentation

## 2019-01-28 DIAGNOSIS — Y92002 Bathroom of unspecified non-institutional (private) residence single-family (private) house as the place of occurrence of the external cause: Secondary | ICD-10-CM | POA: Diagnosis not present

## 2019-01-28 DIAGNOSIS — R202 Paresthesia of skin: Secondary | ICD-10-CM | POA: Diagnosis not present

## 2019-01-28 DIAGNOSIS — Z951 Presence of aortocoronary bypass graft: Secondary | ICD-10-CM | POA: Insufficient documentation

## 2019-01-28 DIAGNOSIS — Y93E8 Activity, other personal hygiene: Secondary | ICD-10-CM | POA: Diagnosis not present

## 2019-01-28 DIAGNOSIS — W01198A Fall on same level from slipping, tripping and stumbling with subsequent striking against other object, initial encounter: Secondary | ICD-10-CM | POA: Diagnosis not present

## 2019-01-28 DIAGNOSIS — Y999 Unspecified external cause status: Secondary | ICD-10-CM | POA: Diagnosis not present

## 2019-01-28 DIAGNOSIS — Z87891 Personal history of nicotine dependence: Secondary | ICD-10-CM | POA: Insufficient documentation

## 2019-01-28 DIAGNOSIS — N186 End stage renal disease: Secondary | ICD-10-CM | POA: Diagnosis not present

## 2019-01-28 DIAGNOSIS — Z7982 Long term (current) use of aspirin: Secondary | ICD-10-CM | POA: Insufficient documentation

## 2019-01-28 DIAGNOSIS — S066X0A Traumatic subarachnoid hemorrhage without loss of consciousness, initial encounter: Secondary | ICD-10-CM | POA: Diagnosis not present

## 2019-01-28 DIAGNOSIS — I609 Nontraumatic subarachnoid hemorrhage, unspecified: Secondary | ICD-10-CM | POA: Diagnosis not present

## 2019-01-28 DIAGNOSIS — M25561 Pain in right knee: Secondary | ICD-10-CM | POA: Diagnosis not present

## 2019-01-28 DIAGNOSIS — Z209 Contact with and (suspected) exposure to unspecified communicable disease: Secondary | ICD-10-CM | POA: Diagnosis not present

## 2019-01-28 DIAGNOSIS — R52 Pain, unspecified: Secondary | ICD-10-CM | POA: Diagnosis not present

## 2019-01-28 NOTE — ED Notes (Signed)
Spoke with [facility] will paged Neurosurgery on call to [phone].

## 2019-01-28 NOTE — Consult Note (Signed)
I was contacted by Dr. Roderic Palau regarding this patient.  The patient is a 58 year old white female on aspirin with end-stage renal disease and hemodialysis.  She fell this morning and struck her head. She has a mild headache but otherwise by report is doing well clinically and asking to eat.    I have reviewed her head CT performed at [facility] today,  it demonstrates a small traumatic left sylvian fissure subarachnoid hemorrhage.  I discussed this with Dr. Roderic Palau.  If she continues to do well and is able to eat without nausea vomiting.  She can be discharged from my point of view and follow-up prn.  I have recommend that she hold her aspirin and heparin for hemodialysis for a few days.  Please contact me if I can be of further assistance.

## 2019-01-28 NOTE — ED Notes (Signed)
Pt reports she recently finished an antibiotic she was given for an earache.

## 2019-01-28 NOTE — ED Triage Notes (Signed)
Pt reports has had generalized weakness x 2 months since they changed her thyroid medication.  Reports her feet "got tangled up" when she got off commode and she fell.  Pt has skin tear to left forearm that ems dressed.  EMS says pt recently had her bp medication changed also.  Pt c/o pain in r knee and back of head.  Denies any Loss of consciousness.  EMS says pt was in floor for approx 1 hour.  CBG 102

## 2019-01-28 NOTE — Discharge Instructions (Addendum)
Follow-up with your doctor or return if any problems.  Take Tylenol for pain.  Do not take your aspirin for 5 days.  Go to your dialysis tomorrow

## 2019-01-28 NOTE — ED Provider Notes (Signed)
[facility] EMERGENCY DEPARTMENT Provider Note   CSN: [id number] Arrival date & time: 01/28/19  0736     History   Chief Complaint Chief Complaint  Patient presents with   Fall    HPI Sherry Cox is a 58 y.o. female.     Patient states that she fell in the bathroom and hit her head on the tub but did not lose consciousness.  She is a dialysis patient and has dialysis tomorrow.  She complains of a mild headache  The history is provided by the patient. No language interpreter was used.  Fall This is a new problem. The current episode started 3 to 5 hours ago. The problem occurs rarely. The problem has been resolved. Associated symptoms include chest pain and headaches. Pertinent negatives include no abdominal pain. Nothing aggravates the symptoms. Nothing relieves the symptoms. She has tried nothing for the symptoms. The treatment provided no relief.    Past Medical History:  Diagnosis Date   Ankle injury    Anxiety    Anxiety disorder    CAD (coronary artery disease)    Total RCA, stress echo [facility] 11/2008-no ischemia, EF 55%; h/o CABG   Carotid artery stenosis    12/10/2008 doppler 01-65% RICA, 5-37% LICA/see evaluation by Dr. Oneida Alar 2011   Ejection fraction    EF 60-65%, echo, June, 2009   ESRD on dialysis [facility])    Transplant consideration   Hemochromatosis    Hx of CABG    Off pump LIMA to LAD  05/2006   Hx of tobacco use, presenting hazards to health 04/26/2012   Hyperlipidemia    Irregular heart beat    Murmur    October, 2012   Neuropathy    Overweight(278.02)    Tobacco abuse     Patient Active Problem List   Diagnosis Date Noted   Grieving 05/12/2018   Bilateral lower extremity edema 03/26/2018   Lipoma of back 07/05/2017   Benign hypertension with ESRD (end-stage renal disease) ([place]) 06/03/2017   Hyperparathyroidism due to ESRD on dialysis ([place]) 05/20/2017   Hereditary hemochromatosis ([place]) 05/20/2017   COPD (chronic  obstructive pulmonary disease) ([place]) 05/20/2017   Ostium secundum type atrial septal defect 05/20/2017   Anemia of chronic renal failure 05/20/2017   Chronic pain syndrome 05/06/2017   Polyarthralgia 05/06/2017   Displaced transverse fracture of right patella, initial encounter for closed fracture 03/11/2017   Patellar fracture 03/10/2017   Hyponatremia 03/10/2017   Flow murmur 12/22/2014   Insomnia due to mental disorder 10/01/2012   Occlusion and stenosis of carotid artery without mention of cerebral infarction 08/30/2011   Ejection fraction    CAD (coronary artery disease)    Carotid artery stenosis    Hyperlipidemia    ESRD on dialysis ([place])    Tobacco use disorder    Hx of CABG    Hypothyroidism 09/26/2009   BMI 37.0-37.9, adult 06/09/2009   Anxiety state 06/09/2009    Past Surgical History:  Procedure Laterality Date   AV FISTULA PLACEMENT     Has HD on Tues,Thurs, and Saturday at [place]  2007   CAROTID ANGIOGRAM Bilateral 03/06/2013   Procedure: CAROTID Cyril Loosen;  Surgeon: Elam Dutch, MD;  Location: [facility] CATH LAB;  Service: Cardiovascular;  Laterality: Bilateral;   CHOLECYSTECTOMY  1995   Gall Bladder   CORONARY ARTERY BYPASS GRAFT  05/2006   Off pump LIMA to LAD   INNER EAR SURGERY     ORIF  PATELLA Right 03/11/2017   Procedure: OPEN REDUCTION INTERNAL (ORIF) FIXATION PATELLA;  Surgeon: Rod Can, MD;  Location: [place];  Service: Orthopedics;  Laterality: Right;   Plastic Surgery on leg       OB History   No obstetric history on file.      Home Medications    Prior to Admission medications   Medication Sig Start Date End Date Taking? Authorizing Provider  amLODipine (NORVASC) 10 MG tablet TAKE ONE TABLET BY MOUTH DAILY. 05/01/18  Yes BranchAlphonse Guild, MD  aspirin EC 81 MG tablet Take 1 tablet (81 mg total) by mouth daily. 06/18/18  Yes Arnoldo Lenis, MD  atorvastatin (LIPITOR) 80 MG  tablet TAKE ONE TABLET BY MOUTH DAILY. 01/01/19  Yes BranchAlphonse Guild, MD  azelastine (ASTELIN) 0.1 % nasal spray Place 2 sprays into both nostrils 2 (two) times daily. Use in each nostril as directed 08/13/18  Yes Gottschalk, Ashly M, DO  budesonide-formoterol (SYMBICORT) 160-4.5 MCG/ACT inhaler INHALE TWO PUFFS INTO THE LUNGS TWICE DAILY. 10/22/18  Yes Gottschalk, Ashly M, DO  carvedilol (COREG) 12.5 MG tablet TAKE ONE TABLET BY MOUTH TWICE DAILY. STOP LOPRESSOR. 01/01/19  Yes Branch, Alphonse Guild, MD  clonazePAM (KLONOPIN) 0.5 MG tablet Take 1 tablet (0.5 mg total) by mouth 4 (four) times daily. 12/15/18  Yes Cloria Spring, MD  DULoxetine (CYMBALTA) 60 MG capsule Take 1 capsule (60 mg total) by mouth daily. 12/15/18  Yes Cloria Spring, MD  folic acid (FOLVITE) 1 MG tablet TAKE ONE TABLET BY MOUTH DAILY. 10/10/18  Yes Ronnie Doss M, DO  folic acid-vitamin b complex-vitamin c-selenium-zinc (DIALYVITE) 3 MG TABS Take 1 tablet by mouth daily.     Yes [provider]  HYDROcodone-acetaminophen (NORCO) 5-325 MG tablet Take 1 tablet by mouth 2 (two) times daily as needed for moderate pain. 08/05/17  Yes Gottschalk, Leatrice Jewels M, DO  ibuprofen (ADVIL,MOTRIN) 200 MG tablet Take 400 mg by mouth every 6 (six) hours as needed for mild pain.   Yes [provider]  levothyroxine (SYNTHROID, LEVOTHROID) 150 MCG tablet TAKE ONE TABLET BY MOUTH DAILY. 04/28/18  Yes Gottschalk, Ashly M, DO  nitroGLYCERIN (NITROSTAT) 0.4 MG SL tablet DISSOLVE ONE TABLET UNDER TONGUE EVERY 5 MINUTES UP TO 3 DOSES AS NEEDED FOR CHEST PAIN 01/02/19  Yes Branch, Alphonse Guild, MD  olopatadine (PATANOL) 0.1 % ophthalmic solution Place 1 drop into both eyes 2 (two) times daily. 08/13/18  Yes Ronnie Doss M, DO  OXYGEN Inhale into the lungs. On 2   Yes [provider]  sertraline (ZOLOFT) 100 MG tablet Take 1 tablet (100 mg total) by mouth daily. 12/15/18  Yes Cloria Spring, MD  sevelamer (RENVELA) 800 MG tablet Take  1,600-4,000 mg by mouth as directed. Take 5 tablets (409m) by mouth with meals and 1600 tablets by mouth with snacks.   Yes [provider]  sodium bicarbonate 650 MG tablet Take 650 mg by mouth 2 (two) times daily.    Yes [provider]  sodium chloride (OCEAN) 0.65 % SOLN nasal spray Place 1 spray into both nostrils as needed for congestion.   Yes [provider]  traMADol (ULTRAM) 50 MG tablet Take 50 mg by mouth at bedtime.  09/25/17  Yes [provider]  VENTOLIN HFA 108 (90 Base) MCG/ACT inhaler INHALE TWO PUFFS INTO THE LUNGS EVERY 6 HOURS AS NEEDED FOR WHEEZING OR SHORTNESS OF BREATH. 01/01/19  Yes Gottschalk, Ashly M, DO  cefdinir (OMNICEF) 300  MG capsule 362m postdialysis days Patient not taking: Reported on 01/28/2019 01/08/19   Dettinger, JFransisca Kaufmann MD  [facility] MG/0.1ML LIQD nasal spray kit Place 1 spray into the nose once as needed. overdose 05/06/17   [provider]    Family History Family History  Problem Relation Age of Onset   Depression Sister    Dementia Sister    OCD Sister    Diabetes Sister    Hypertension Sister    Heart attack Sister    Bipolar disorder Brother    Drug abuse Brother    Diabetes Brother    Hypertension Brother    Heart disease Brother    Hemochromatosis Brother    Bipolar disorder Other    ADD / ADHD Other    Seizures Other    Bipolar disorder Other    Alcohol abuse Other    Dementia Mother    Heart disease Mother    Hypertension Mother    Heart attack Mother    Alcohol abuse Father    Heart disease Father        Heart Disease before age 58  Hypertension Father    Heart attack Father    Dementia Maternal Aunt    OCD Sister    Heart disease Sister        Heart Disease before age 58  Diabetes Sister    Hemochromatosis Sister    Hypertension Sister    Rheum arthritis Sister    Heart attack Brother    COPD Brother    Heart attack Brother    Paranoid  behavior Neg Hx    Schizophrenia Neg Hx    Sexual abuse Neg Hx    Physical abuse Neg Hx     Social History Social History   Tobacco Use   Smoking status: Former Smoker    Packs/day: 0.10    Years: 30.00    Pack years: 3.00    Types: Cigarettes    Start date: 02/02/1972    Quit date: 12/18/2018    Years since quitting: 0.1   Smokeless tobacco: Never Used  Substance Use Topics   Alcohol use: No    Alcohol/week: 0.0 standard drinks   Drug use: No     Allergies   Augmentin [amoxicillin-pot clavulanate]   Review of Systems Review of Systems  Constitutional: Negative for appetite change and fatigue.  HENT: Negative for congestion, ear discharge and sinus pressure.   Eyes: Negative for discharge.  Respiratory: Negative for cough.   Cardiovascular: Positive for chest pain.  Gastrointestinal: Negative for abdominal pain and diarrhea.  Genitourinary: Negative for frequency and hematuria.  Musculoskeletal: Negative for back pain.  Skin: Negative for rash.  Neurological: Positive for headaches. Negative for seizures.  Psychiatric/Behavioral: Negative for hallucinations.     Physical Exam Updated Vital Signs BP (!) 156/78 (BP Location: Right Arm)    Pulse (!) 102    Temp (!) 97.4 F (36.3 C) (Oral)    Resp 18    Ht '4\' 10"'  (1.473 m)    Wt 71.2 kg    SpO2 100%    BMI 32.81 kg/m   Physical Exam Vitals signs and nursing note reviewed.  Constitutional:      Appearance: She is well-developed.  HENT:     Head:     Comments: Mild tenderness is to the occipital area    Nose: Nose normal.  Eyes:     General: No scleral icterus.    Conjunctiva/sclera: Conjunctivae normal.  Neck:     Musculoskeletal: Neck supple.     Thyroid: No thyromegaly.  Cardiovascular:     Rate and Rhythm: Normal rate and regular rhythm.     Heart sounds: No murmur. No friction rub. No gallop.   Pulmonary:     Breath sounds: No stridor. No wheezing or rales.  Chest:     Chest wall: No  tenderness.  Abdominal:     General: There is no distension.     Tenderness: There is no abdominal tenderness. There is no rebound.  Musculoskeletal: Normal range of motion.  Lymphadenopathy:     Cervical: No cervical adenopathy.  Skin:    Findings: No erythema or rash.  Neurological:     Mental Status: She is oriented to person, place, and time.     Motor: No abnormal muscle tone.     Coordination: Coordination normal.  Psychiatric:        Behavior: Behavior normal.      ED Treatments / Results  Labs (all labs ordered are listed, but only abnormal results are displayed) Labs Reviewed - No data to display  EKG None  Radiology Ct Head Wo Contrast  Result Date: 01/28/2019 CLINICAL DATA:  Fall. EXAM: CT HEAD WITHOUT CONTRAST CT CERVICAL SPINE WITHOUT CONTRAST TECHNIQUE: Multidetector CT imaging of the head and cervical spine was performed following the standard protocol without intravenous contrast. Multiplanar CT image reconstructions of the cervical spine were also generated. COMPARISON:  None. FINDINGS: CT HEAD FINDINGS Brain: There is blood within the left sylvian fissure and possibly overlying the left frontal lobe in areas most compatible with subarachnoid hemorrhage. No intraparenchymal contusion. No hydrocephalus or midline shift. Vascular: No hyperdense vessel or unexpected calcification. Skull: No acute calvarial abnormality. Sinuses/Orbits: Visualized paranasal sinuses and mastoids clear. Orbital soft tissues unremarkable. Other: None CT CERVICAL SPINE FINDINGS Alignment: Limited study by patient motion.  No visible subluxation. Skull base and vertebrae: No visible fracture or focal bone lesion. Soft tissues and spinal canal: No prevertebral fluid or swelling. No visible canal hematoma. Disc levels:  Maintained Upper chest: Moderate to large right pleural effusion. Other: None IMPRESSION: Subarachnoid hemorrhage within the left sylvian fissure and overlying the left frontal  lobe. No intraparenchymal contusion or midline shift. Evaluation of the cervical spine is limited due to positioning and patient motion. No visible acute bony abnormality. Critical Value/emergent results were called by telephone at the time of interpretation on 01/28/2019 at 9:13 am to Dr. Milton Ferguson , who verbally acknowledged these results. Electronically Signed   By: Rolm Baptise M.D.   On: 01/28/2019 09:14   Ct Cervical Spine Wo Contrast  Result Date: 01/28/2019 CLINICAL DATA:  Fall. EXAM: CT HEAD WITHOUT CONTRAST CT CERVICAL SPINE WITHOUT CONTRAST TECHNIQUE: Multidetector CT imaging of the head and cervical spine was performed following the standard protocol without intravenous contrast. Multiplanar CT image reconstructions of the cervical spine were also generated. COMPARISON:  None. FINDINGS: CT HEAD FINDINGS Brain: There is blood within the left sylvian fissure and possibly overlying the left frontal lobe in areas most compatible with subarachnoid hemorrhage. No intraparenchymal contusion. No hydrocephalus or midline shift. Vascular: No hyperdense vessel or unexpected calcification. Skull: No acute calvarial abnormality. Sinuses/Orbits: Visualized paranasal sinuses and mastoids clear. Orbital soft tissues unremarkable. Other: None CT CERVICAL SPINE FINDINGS Alignment: Limited study by patient motion.  No visible subluxation. Skull base and vertebrae: No visible fracture or focal bone lesion. Soft tissues and spinal canal: No prevertebral fluid or swelling. No visible  canal hematoma. Disc levels:  Maintained Upper chest: Moderate to large right pleural effusion. Other: None IMPRESSION: Subarachnoid hemorrhage within the left sylvian fissure and overlying the left frontal lobe. No intraparenchymal contusion or midline shift. Evaluation of the cervical spine is limited due to positioning and patient motion. No visible acute bony abnormality. Critical Value/emergent results were called by telephone at the  time of interpretation on 01/28/2019 at 9:13 am to Dr. Milton Ferguson , who verbally acknowledged these results. Electronically Signed   By: Rolm Baptise M.D.   On: 01/28/2019 09:14    Procedures Procedures (including critical care time)  Medications Ordered in ED Medications - No data to display   Initial Impression / Assessment and Plan / ED Course  I have reviewed the triage vital signs and the nursing notes.  Pertinent labs & imaging results that were available during my care of the patient were reviewed by me and considered in my medical decision making (see chart for details).        Patient with a small subarachnoid hemorrhage.  I spoke with neurosurgery Dr. Earle Gell and he reviewed the CT scan.  He stated if the patient is feeling well after 4 to 5 hours from the incident and is eating and drinking okay that the patient could be discharged home for follow-up if any problems.  He recommended stopping aspirin for 4 to 5 days and not to use heparin tomorrow and her dialysis treatment.  I spoke with her nephrologist and he will make sure she does not get the heparin patient will take Tylenol for pain follow-up as needed Final Clinical Impressions(s) / ED Diagnoses   Final diagnoses:  Fall, initial encounter  Subarachnoid bleed [facility])    ED Discharge Orders    None       Milton Ferguson, MD 01/28/19 1146

## 2019-01-29 DIAGNOSIS — N186 End stage renal disease: Secondary | ICD-10-CM | POA: Diagnosis not present

## 2019-01-29 DIAGNOSIS — D631 Anemia in chronic kidney disease: Secondary | ICD-10-CM | POA: Diagnosis not present

## 2019-01-29 DIAGNOSIS — D509 Iron deficiency anemia, unspecified: Secondary | ICD-10-CM | POA: Diagnosis not present

## 2019-01-29 DIAGNOSIS — Z992 Dependence on renal dialysis: Secondary | ICD-10-CM | POA: Diagnosis not present

## 2019-01-29 DIAGNOSIS — N2581 Secondary hyperparathyroidism of renal origin: Secondary | ICD-10-CM | POA: Diagnosis not present

## 2019-01-31 DIAGNOSIS — D509 Iron deficiency anemia, unspecified: Secondary | ICD-10-CM | POA: Diagnosis not present

## 2019-01-31 DIAGNOSIS — N2581 Secondary hyperparathyroidism of renal origin: Secondary | ICD-10-CM | POA: Diagnosis not present

## 2019-01-31 DIAGNOSIS — Z992 Dependence on renal dialysis: Secondary | ICD-10-CM | POA: Diagnosis not present

## 2019-01-31 DIAGNOSIS — N186 End stage renal disease: Secondary | ICD-10-CM | POA: Diagnosis not present

## 2019-01-31 DIAGNOSIS — D631 Anemia in chronic kidney disease: Secondary | ICD-10-CM | POA: Diagnosis not present

## 2019-02-03 DIAGNOSIS — D631 Anemia in chronic kidney disease: Secondary | ICD-10-CM | POA: Diagnosis not present

## 2019-02-03 DIAGNOSIS — D509 Iron deficiency anemia, unspecified: Secondary | ICD-10-CM | POA: Diagnosis not present

## 2019-02-03 DIAGNOSIS — N2581 Secondary hyperparathyroidism of renal origin: Secondary | ICD-10-CM | POA: Diagnosis not present

## 2019-02-03 DIAGNOSIS — N186 End stage renal disease: Secondary | ICD-10-CM | POA: Diagnosis not present

## 2019-02-03 DIAGNOSIS — Z992 Dependence on renal dialysis: Secondary | ICD-10-CM | POA: Diagnosis not present

## 2019-02-04 ENCOUNTER — Telehealth: Payer: Self-pay | Admitting: Family Medicine

## 2019-02-04 NOTE — Telephone Encounter (Signed)
Patient scheduled for hospital follow up tomorrow with Dettinger. PCP not here.

## 2019-02-05 ENCOUNTER — Ambulatory Visit: Payer: Medicare Other | Admitting: Family Medicine

## 2019-02-05 DIAGNOSIS — R0689 Other abnormalities of breathing: Secondary | ICD-10-CM | POA: Diagnosis not present

## 2019-02-05 DIAGNOSIS — D631 Anemia in chronic kidney disease: Secondary | ICD-10-CM | POA: Diagnosis not present

## 2019-02-05 DIAGNOSIS — R4182 Altered mental status, unspecified: Secondary | ICD-10-CM | POA: Diagnosis not present

## 2019-02-05 DIAGNOSIS — E785 Hyperlipidemia, unspecified: Secondary | ICD-10-CM | POA: Diagnosis not present

## 2019-02-05 DIAGNOSIS — E039 Hypothyroidism, unspecified: Secondary | ICD-10-CM | POA: Diagnosis not present

## 2019-02-05 DIAGNOSIS — R000 Tachycardia, unspecified: Secondary | ICD-10-CM | POA: Diagnosis not present

## 2019-02-05 DIAGNOSIS — Z992 Dependence on renal dialysis: Secondary | ICD-10-CM | POA: Diagnosis not present

## 2019-02-05 DIAGNOSIS — R404 Transient alteration of awareness: Secondary | ICD-10-CM | POA: Diagnosis not present

## 2019-02-05 DIAGNOSIS — D509 Iron deficiency anemia, unspecified: Secondary | ICD-10-CM | POA: Diagnosis not present

## 2019-02-05 DIAGNOSIS — Z951 Presence of aortocoronary bypass graft: Secondary | ICD-10-CM | POA: Diagnosis not present

## 2019-02-05 DIAGNOSIS — I2510 Atherosclerotic heart disease of native coronary artery without angina pectoris: Secondary | ICD-10-CM | POA: Diagnosis not present

## 2019-02-05 DIAGNOSIS — Z79899 Other long term (current) drug therapy: Secondary | ICD-10-CM | POA: Diagnosis not present

## 2019-02-05 DIAGNOSIS — I499 Cardiac arrhythmia, unspecified: Secondary | ICD-10-CM | POA: Diagnosis not present

## 2019-02-05 DIAGNOSIS — I469 Cardiac arrest, cause unspecified: Secondary | ICD-10-CM | POA: Diagnosis not present

## 2019-02-05 DIAGNOSIS — N186 End stage renal disease: Secondary | ICD-10-CM | POA: Diagnosis not present

## 2019-02-05 DIAGNOSIS — I252 Old myocardial infarction: Secondary | ICD-10-CM | POA: Diagnosis not present

## 2019-02-05 DIAGNOSIS — N2581 Secondary hyperparathyroidism of renal origin: Secondary | ICD-10-CM | POA: Diagnosis not present

## 2019-02-05 DIAGNOSIS — R092 Respiratory arrest: Secondary | ICD-10-CM | POA: Diagnosis not present

## 2019-02-05 DIAGNOSIS — Z209 Contact with and (suspected) exposure to unspecified communicable disease: Secondary | ICD-10-CM | POA: Diagnosis not present

## 2019-02-06 DIAGNOSIS — N186 End stage renal disease: Secondary | ICD-10-CM | POA: Diagnosis not present

## 2019-02-06 DIAGNOSIS — Z992 Dependence on renal dialysis: Secondary | ICD-10-CM | POA: Diagnosis not present

## 2019-02-07 DEATH — deceased

## 2019-02-19 ENCOUNTER — Encounter: Payer: Medicare Other | Admitting: Vascular Surgery

## 2019-03-18 ENCOUNTER — Ambulatory Visit (HOSPITAL_COMMUNITY): Payer: Medicare Other | Admitting: Psychiatry
